# Patient Record
Sex: Female | Born: 1955 | Race: Black or African American | Hispanic: No | Marital: Single | Smoking: Never smoker
Health system: Southern US, Community
[De-identification: ages and names within clinical notes are randomized; demographics above are authoritative.]

## PROBLEM LIST (undated history)

## (undated) DIAGNOSIS — T4145XA Adverse effect of unspecified anesthetic, initial encounter: Secondary | ICD-10-CM

## (undated) DIAGNOSIS — M1990 Unspecified osteoarthritis, unspecified site: Secondary | ICD-10-CM

## (undated) DIAGNOSIS — K760 Fatty (change of) liver, not elsewhere classified: Secondary | ICD-10-CM

## (undated) DIAGNOSIS — K219 Gastro-esophageal reflux disease without esophagitis: Secondary | ICD-10-CM

## (undated) DIAGNOSIS — Z973 Presence of spectacles and contact lenses: Secondary | ICD-10-CM

## (undated) DIAGNOSIS — T7840XA Allergy, unspecified, initial encounter: Secondary | ICD-10-CM

## (undated) DIAGNOSIS — Z9889 Other specified postprocedural states: Secondary | ICD-10-CM

## (undated) DIAGNOSIS — R519 Headache, unspecified: Secondary | ICD-10-CM

## (undated) DIAGNOSIS — E559 Vitamin D deficiency, unspecified: Secondary | ICD-10-CM

## (undated) DIAGNOSIS — R7303 Prediabetes: Secondary | ICD-10-CM

## (undated) DIAGNOSIS — G4730 Sleep apnea, unspecified: Secondary | ICD-10-CM

## (undated) DIAGNOSIS — T8859XA Other complications of anesthesia, initial encounter: Secondary | ICD-10-CM

## (undated) DIAGNOSIS — R42 Dizziness and giddiness: Secondary | ICD-10-CM

## (undated) DIAGNOSIS — J069 Acute upper respiratory infection, unspecified: Secondary | ICD-10-CM

## (undated) DIAGNOSIS — R112 Nausea with vomiting, unspecified: Secondary | ICD-10-CM

## (undated) DIAGNOSIS — L509 Urticaria, unspecified: Secondary | ICD-10-CM

## (undated) DIAGNOSIS — J45909 Unspecified asthma, uncomplicated: Secondary | ICD-10-CM

## (undated) DIAGNOSIS — F419 Anxiety disorder, unspecified: Secondary | ICD-10-CM

## (undated) DIAGNOSIS — R079 Chest pain, unspecified: Secondary | ICD-10-CM

## (undated) HISTORY — PX: BREAST BIOPSY: SHX20

## (undated) HISTORY — PX: TONSILLECTOMY: SUR1361

## (undated) HISTORY — PX: LIPOMA EXCISION: SHX5283

## (undated) HISTORY — DX: Acute upper respiratory infection, unspecified: J06.9

## (undated) HISTORY — DX: Fatty (change of) liver, not elsewhere classified: K76.0

## (undated) HISTORY — DX: Vitamin D deficiency, unspecified: E55.9

## (undated) HISTORY — DX: Dizziness and giddiness: R42

## (undated) HISTORY — DX: Chest pain, unspecified: R07.9

## (undated) HISTORY — DX: Allergy, unspecified, initial encounter: T78.40XA

## (undated) HISTORY — PX: VAGINAL HYSTERECTOMY: SUR661

## (undated) HISTORY — DX: Unspecified asthma, uncomplicated: J45.909

## (undated) HISTORY — PX: BREAST EXCISIONAL BIOPSY: SUR124

## (undated) HISTORY — DX: Urticaria, unspecified: L50.9

## (undated) HISTORY — PX: VAGINAL PROLAPSE REPAIR: SHX830

---
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
## 1984-02-15 HISTORY — PX: APPENDECTOMY: SHX54

## 1984-02-15 HISTORY — PX: VAGINAL HYSTERECTOMY: SUR661

## 2003-03-03 ENCOUNTER — Encounter: Admission: RE | Admit: 2003-03-03 | Discharge: 2003-03-03 | Payer: Self-pay | Admitting: Obstetrics and Gynecology

## 2003-03-28 ENCOUNTER — Encounter: Admission: RE | Admit: 2003-03-28 | Discharge: 2003-03-28 | Payer: Self-pay | Admitting: Obstetrics and Gynecology

## 2003-03-28 ENCOUNTER — Encounter (INDEPENDENT_AMBULATORY_CARE_PROVIDER_SITE_OTHER): Payer: Self-pay | Admitting: Specialist

## 2004-01-09 ENCOUNTER — Encounter: Admission: RE | Admit: 2004-01-09 | Discharge: 2004-01-09 | Payer: Self-pay | Admitting: Obstetrics and Gynecology

## 2005-03-30 ENCOUNTER — Encounter: Admission: RE | Admit: 2005-03-30 | Discharge: 2005-03-30 | Payer: Self-pay | Admitting: Obstetrics and Gynecology

## 2006-06-07 ENCOUNTER — Encounter: Admission: RE | Admit: 2006-06-07 | Discharge: 2006-06-07 | Payer: Self-pay | Admitting: Obstetrics and Gynecology

## 2006-07-05 ENCOUNTER — Other Ambulatory Visit: Admission: RE | Admit: 2006-07-05 | Discharge: 2006-07-05 | Payer: Self-pay | Admitting: Obstetrics and Gynecology

## 2007-08-13 ENCOUNTER — Encounter: Admission: RE | Admit: 2007-08-13 | Discharge: 2007-08-13 | Payer: Self-pay | Admitting: Obstetrics and Gynecology

## 2008-01-21 ENCOUNTER — Encounter: Admission: RE | Admit: 2008-01-21 | Discharge: 2008-01-21 | Payer: Self-pay | Admitting: Internal Medicine

## 2008-06-27 ENCOUNTER — Encounter: Admission: RE | Admit: 2008-06-27 | Discharge: 2008-06-27 | Payer: Self-pay | Admitting: Internal Medicine

## 2009-02-14 DIAGNOSIS — J45909 Unspecified asthma, uncomplicated: Secondary | ICD-10-CM

## 2009-02-14 HISTORY — DX: Unspecified asthma, uncomplicated: J45.909

## 2009-03-19 ENCOUNTER — Encounter: Admission: RE | Admit: 2009-03-19 | Discharge: 2009-03-19 | Payer: Self-pay | Admitting: Obstetrics and Gynecology

## 2010-04-23 ENCOUNTER — Other Ambulatory Visit: Payer: Self-pay | Admitting: *Deleted

## 2010-04-23 ENCOUNTER — Other Ambulatory Visit: Payer: Self-pay | Admitting: Obstetrics and Gynecology

## 2010-04-23 DIAGNOSIS — Z1231 Encounter for screening mammogram for malignant neoplasm of breast: Secondary | ICD-10-CM

## 2010-04-28 ENCOUNTER — Ambulatory Visit: Payer: Self-pay

## 2010-04-29 ENCOUNTER — Ambulatory Visit
Admission: RE | Admit: 2010-04-29 | Discharge: 2010-04-29 | Disposition: A | Discharge status: Home / Self Care | Payer: 59 | Source: Ambulatory Visit | Attending: *Deleted | Admitting: *Deleted

## 2010-04-29 DIAGNOSIS — Z1231 Encounter for screening mammogram for malignant neoplasm of breast: Secondary | ICD-10-CM

## 2011-07-27 ENCOUNTER — Other Ambulatory Visit: Payer: Self-pay | Admitting: Obstetrics and Gynecology

## 2011-07-27 DIAGNOSIS — Z1231 Encounter for screening mammogram for malignant neoplasm of breast: Secondary | ICD-10-CM

## 2011-08-03 ENCOUNTER — Ambulatory Visit
Admission: RE | Admit: 2011-08-03 | Discharge: 2011-08-03 | Disposition: A | Discharge status: Home / Self Care | Payer: 59 | Source: Ambulatory Visit | Attending: Obstetrics and Gynecology | Admitting: Obstetrics and Gynecology

## 2011-08-03 DIAGNOSIS — Z1231 Encounter for screening mammogram for malignant neoplasm of breast: Secondary | ICD-10-CM

## 2013-10-24 ENCOUNTER — Other Ambulatory Visit: Payer: Self-pay

## 2013-10-24 DIAGNOSIS — Z1231 Encounter for screening mammogram for malignant neoplasm of breast: Secondary | ICD-10-CM

## 2013-11-05 ENCOUNTER — Ambulatory Visit
Admission: RE | Admit: 2013-11-05 | Discharge: 2013-11-05 | Disposition: A | Discharge status: Home / Self Care | Payer: 59 | Source: Ambulatory Visit

## 2013-11-05 DIAGNOSIS — Z1231 Encounter for screening mammogram for malignant neoplasm of breast: Secondary | ICD-10-CM

## 2014-10-16 ENCOUNTER — Other Ambulatory Visit: Payer: Self-pay | Admitting: Obstetrics and Gynecology

## 2014-10-16 DIAGNOSIS — R229 Localized swelling, mass and lump, unspecified: Principal | ICD-10-CM

## 2014-10-16 DIAGNOSIS — IMO0002 Reserved for concepts with insufficient information to code with codable children: Secondary | ICD-10-CM

## 2014-10-24 ENCOUNTER — Ambulatory Visit
Admission: RE | Admit: 2014-10-24 | Discharge: 2014-10-24 | Disposition: A | Discharge status: Home / Self Care | Payer: 59 | Source: Ambulatory Visit | Attending: Obstetrics and Gynecology | Admitting: Obstetrics and Gynecology

## 2014-10-24 ENCOUNTER — Other Ambulatory Visit: Payer: Self-pay | Admitting: Obstetrics and Gynecology

## 2014-10-24 DIAGNOSIS — R229 Localized swelling, mass and lump, unspecified: Principal | ICD-10-CM

## 2014-10-24 DIAGNOSIS — IMO0002 Reserved for concepts with insufficient information to code with codable children: Secondary | ICD-10-CM

## 2014-11-05 DIAGNOSIS — J45909 Unspecified asthma, uncomplicated: Secondary | ICD-10-CM | POA: Insufficient documentation

## 2014-11-05 DIAGNOSIS — H1010 Acute atopic conjunctivitis, unspecified eye: Secondary | ICD-10-CM | POA: Insufficient documentation

## 2014-11-05 DIAGNOSIS — J309 Allergic rhinitis, unspecified: Secondary | ICD-10-CM | POA: Insufficient documentation

## 2014-11-17 ENCOUNTER — Ambulatory Visit (INDEPENDENT_AMBULATORY_CARE_PROVIDER_SITE_OTHER): Payer: 59

## 2014-11-17 DIAGNOSIS — H1010 Acute atopic conjunctivitis, unspecified eye: Secondary | ICD-10-CM | POA: Diagnosis not present

## 2014-11-17 DIAGNOSIS — J309 Allergic rhinitis, unspecified: Secondary | ICD-10-CM | POA: Diagnosis not present

## 2014-11-28 ENCOUNTER — Ambulatory Visit (INDEPENDENT_AMBULATORY_CARE_PROVIDER_SITE_OTHER): Payer: 59 | Admitting: Neurology

## 2014-11-28 DIAGNOSIS — J309 Allergic rhinitis, unspecified: Secondary | ICD-10-CM

## 2014-12-02 ENCOUNTER — Other Ambulatory Visit: Payer: Self-pay | Admitting: Allergy and Immunology

## 2014-12-04 ENCOUNTER — Ambulatory Visit (INDEPENDENT_AMBULATORY_CARE_PROVIDER_SITE_OTHER): Payer: 59

## 2014-12-04 DIAGNOSIS — J309 Allergic rhinitis, unspecified: Secondary | ICD-10-CM

## 2014-12-12 ENCOUNTER — Ambulatory Visit (INDEPENDENT_AMBULATORY_CARE_PROVIDER_SITE_OTHER): Payer: 59

## 2014-12-12 DIAGNOSIS — J301 Allergic rhinitis due to pollen: Secondary | ICD-10-CM

## 2014-12-15 ENCOUNTER — Other Ambulatory Visit: Payer: Self-pay | Admitting: Allergy and Immunology

## 2014-12-18 ENCOUNTER — Ambulatory Visit (INDEPENDENT_AMBULATORY_CARE_PROVIDER_SITE_OTHER): Payer: 59

## 2014-12-18 DIAGNOSIS — J309 Allergic rhinitis, unspecified: Secondary | ICD-10-CM | POA: Diagnosis not present

## 2014-12-25 ENCOUNTER — Ambulatory Visit (INDEPENDENT_AMBULATORY_CARE_PROVIDER_SITE_OTHER): Payer: 59

## 2014-12-25 DIAGNOSIS — J301 Allergic rhinitis due to pollen: Secondary | ICD-10-CM

## 2015-01-01 ENCOUNTER — Ambulatory Visit (INDEPENDENT_AMBULATORY_CARE_PROVIDER_SITE_OTHER): Payer: 59 | Admitting: Allergy and Immunology

## 2015-01-01 ENCOUNTER — Encounter: Payer: Self-pay | Admitting: Allergy and Immunology

## 2015-01-01 VITALS — BP 128/76 | HR 78 | Temp 98.0°F | Resp 18

## 2015-01-01 DIAGNOSIS — J309 Allergic rhinitis, unspecified: Secondary | ICD-10-CM | POA: Diagnosis not present

## 2015-01-01 DIAGNOSIS — H1010 Acute atopic conjunctivitis, unspecified eye: Secondary | ICD-10-CM | POA: Diagnosis not present

## 2015-01-01 DIAGNOSIS — J4540 Moderate persistent asthma, uncomplicated: Secondary | ICD-10-CM | POA: Diagnosis not present

## 2015-01-02 MED ORDER — MOMETASONE FURO-FORMOTEROL FUM 200-5 MCG/ACT IN AERO: 2.0000 | INHALATION_SPRAY | Freq: Two times a day (BID) | RESPIRATORY_TRACT | Status: DC

## 2015-01-02 MED ORDER — MOMETASONE FUROATE 50 MCG/ACT NA SUSP: 2.0000 | Freq: Every day | NASAL | Status: DC

## 2015-01-02 MED ORDER — MONTELUKAST SODIUM 10 MG PO TABS: 10.0000 mg | ORAL_TABLET | Freq: Every day | ORAL | Status: DC

## 2015-01-02 NOTE — Progress Notes (Signed)
FOLLOW UP NOTE  RE: Dayton ScrapeKathrina Y Cayson MRN: [id number] DOB: 07/01/1955 [facility] [place] [address] [place] [id number] Date of Office Visit: 01/01/2015  Subjective:  Dayton ScrapeKathrina Y Jambor is a 59 y.o. female who presents today for Follow-up   Assessment:   1. Moderate persistent asthma.  2.      Allergic rhinoconjunctivitis on immunotherapy, improved.  Plan:  1.  [name] will continue her current regime as is working well for her. 2.  Add Allegra 180 mg consistently over the next 2-4 weeks. 3.  Saline nasal lavage each evening at bath shower time. 4.  Nasonex, Benadryl, EpiPen as needed. 5.  Follow-up in 6 months or sure if needed and consider reevaluation of aeroallergen sensitivities. Meds ordered this encounter  Medications  . montelukast (SINGULAIR) 10 MG tablet    Sig: Take 1 tablet (10 mg total) by mouth at bedtime.    Dispense:  30 tablet    Refill:  3   HPI: [name] returns to the office in follow-up of allergic rhinoconjunctivitis and asthma, though has not been seen in a year.  She continues to receive immunotherapy (now 5 years) without any large local or systemic reactions and is pleased with this benefit.  She finds the change in season, typically triggers slight congestion, sneezing, with cough and on occasion, wheeze without difficulty breathing, shortness of breath, chest tightness, disrupted sleep or activity.  Last week with the significant cold temperatures, she did note chest congestion, postnasal drip, and shortness of breath which is now resolved 100%.  She denies sore throat or headache, discolored drainage or emergency department or urgent care visits, prednisone or courses or antibiotics.  She would like to continue her current regime is working well for her and she is very pleased.  No other new medical issues or concerns.  Current Medications: 1.  Singulair 10 mg daily. 2.  Dulera 200mcg  2  puffs twice daily. 3.  Allegra, Nasonex, Ventolin and EpiPen as needed.   Drug Allergies: Allergies  Allergen Reactions  . Erythromycin   . Oxycodone   . Penicillins   . Sulfa Antibiotics     Objective:   Filed Vitals:   01/01/15 1552  BP: 128/76  Pulse: 78  Temp: 98 F (36.7 C)  Resp: 18   Physical Exam  Constitutional: She is well-developed, well-nourished, and in no distress.  HENT:  Head: Atraumatic.  Right Ear: Tympanic membrane and ear canal normal.  Left Ear: Tympanic membrane and ear canal normal.  Nose: Mucosal edema (minimal) present. No rhinorrhea. No epistaxis.  Mouth/Throat: Oropharynx is clear and moist and mucous membranes are normal. No oropharyngeal exudate, posterior oropharyngeal edema or posterior oropharyngeal erythema.  Neck: Neck supple.  Cardiovascular: Normal rate, S1 normal and S2 normal.   No murmur heard. Pulmonary/Chest: Effort normal. She has no wheezes. She has no rhonchi. She has no rales.  Lymphadenopathy:    She has no cervical adenopathy.    Diagnostics: Spirometry:  FVC 1.63--75%, FEV1 1.42--81%.    Roselyn M. Willa RoughHicks, MD  cc: Dorothyann Pengobyn Sanders, MD

## 2015-01-10 ENCOUNTER — Other Ambulatory Visit: Payer: Self-pay | Admitting: Allergy and Immunology

## 2015-01-12 NOTE — Telephone Encounter (Signed)
Patient needs office visit for more refills

## 2015-01-13 DIAGNOSIS — L989 Disorder of the skin and subcutaneous tissue, unspecified: Secondary | ICD-10-CM | POA: Insufficient documentation

## 2015-01-26 ENCOUNTER — Ambulatory Visit (INDEPENDENT_AMBULATORY_CARE_PROVIDER_SITE_OTHER): Payer: 59 | Admitting: Neurology

## 2015-01-26 DIAGNOSIS — J309 Allergic rhinitis, unspecified: Secondary | ICD-10-CM

## 2015-02-20 DIAGNOSIS — J3089 Other allergic rhinitis: Secondary | ICD-10-CM | POA: Diagnosis not present

## 2015-02-26 ENCOUNTER — Ambulatory Visit (INDEPENDENT_AMBULATORY_CARE_PROVIDER_SITE_OTHER): Payer: 59

## 2015-02-26 DIAGNOSIS — J309 Allergic rhinitis, unspecified: Secondary | ICD-10-CM | POA: Diagnosis not present

## 2015-03-14 ENCOUNTER — Other Ambulatory Visit: Payer: Self-pay | Admitting: Allergy and Immunology

## 2015-03-20 DIAGNOSIS — L821 Other seborrheic keratosis: Secondary | ICD-10-CM | POA: Insufficient documentation

## 2015-03-27 ENCOUNTER — Ambulatory Visit (INDEPENDENT_AMBULATORY_CARE_PROVIDER_SITE_OTHER): Payer: 59 | Admitting: *Deleted

## 2015-03-27 DIAGNOSIS — J309 Allergic rhinitis, unspecified: Secondary | ICD-10-CM

## 2015-04-02 ENCOUNTER — Ambulatory Visit: Payer: 59 | Admitting: Allergy and Immunology

## 2015-04-09 ENCOUNTER — Ambulatory Visit: Payer: 59 | Admitting: Allergy and Immunology

## 2015-04-15 ENCOUNTER — Encounter: Payer: Self-pay | Admitting: Allergy and Immunology

## 2015-04-15 ENCOUNTER — Ambulatory Visit (INDEPENDENT_AMBULATORY_CARE_PROVIDER_SITE_OTHER): Payer: 59 | Admitting: Allergy and Immunology

## 2015-04-15 VITALS — BP 140/72 | HR 78 | Temp 98.0°F | Resp 18

## 2015-04-15 DIAGNOSIS — L509 Urticaria, unspecified: Secondary | ICD-10-CM

## 2015-04-15 DIAGNOSIS — H1010 Acute atopic conjunctivitis, unspecified eye: Secondary | ICD-10-CM

## 2015-04-15 DIAGNOSIS — J4540 Moderate persistent asthma, uncomplicated: Secondary | ICD-10-CM

## 2015-04-15 DIAGNOSIS — J309 Allergic rhinitis, unspecified: Secondary | ICD-10-CM

## 2015-04-15 MED ORDER — MONTELUKAST SODIUM 10 MG PO TABS: 10.0000 mg | ORAL_TABLET | Freq: Every day | ORAL | Status: DC

## 2015-04-15 MED ORDER — MOMETASONE FURO-FORMOTEROL FUM 200-5 MCG/ACT IN AERO: 2.0000 | INHALATION_SPRAY | Freq: Two times a day (BID) | RESPIRATORY_TRACT | Status: DC

## 2015-04-15 NOTE — Patient Instructions (Signed)
   Keep written diary of any further episodes--activity, ingestion, exposure and environment--take pictures.  Continue Allegra  each morning.  Begin Zyrtec  each evening.  Avoid all fragranced soaps/lotions/detergents.  No lip products for now--only vaseline plain.  Continue Singulair and Dulera daily and as needed Ventolin and EpiPen/Benadryl.  Follow-up off anti-histamines 72 hours prior for selective food testing.

## 2015-04-24 ENCOUNTER — Ambulatory Visit (INDEPENDENT_AMBULATORY_CARE_PROVIDER_SITE_OTHER): Payer: 59 | Admitting: *Deleted

## 2015-04-24 DIAGNOSIS — J309 Allergic rhinitis, unspecified: Secondary | ICD-10-CM

## 2015-04-27 ENCOUNTER — Other Ambulatory Visit: Payer: Self-pay

## 2015-04-27 MED ORDER — CETIRIZINE HCL 10 MG PO TABS: 10.0000 mg | ORAL_TABLET | Freq: Every day | ORAL | Status: DC

## 2015-04-27 NOTE — Telephone Encounter (Signed)
Zyrtec rX sent to pharmacy

## 2015-04-28 ENCOUNTER — Other Ambulatory Visit: Payer: Self-pay

## 2015-04-28 MED ORDER — FEXOFENADINE HCL 180 MG PO TABS: ORAL_TABLET | ORAL | Status: DC

## 2015-04-28 NOTE — Progress Notes (Signed)
FOLLOW UP NOTE  RE: CATHLINE DOWEN MRN: [id number] DOB: 1956-01-24 [facility] [place] [address] [place] [address] Date of Office Visit: 04/15/2015  Subjective:  Olivia Paul is a 60 y.o. female who presents today for Allergic Reaction  Assessment:   1. Moderate persistent asthma, well controlled.  2. Allergic rhinoconjunctivitis   3.      Reported episode of contact urticaria--likely related to scented [employer] lotion. 4.      Intermittent urticaria, unclear etiology. Plan:   Meds ordered this encounter  Medications  . mometasone-formoterol (DULERA) 200-5 MCG/ACT AERO    Sig: Inhale 2 puffs into the lungs 2 (two) times daily.    Dispense:  1 Inhaler    Refill:  3  . montelukast (SINGULAIR) 10 MG tablet    Sig: Take 1 tablet (10 mg total) by mouth at bedtime.    Dispense:  30 tablet    Refill:  3   Patient Instructions  1.  Keep written diary of any further episodes--activity, ingestion, exposure and environment--take pictures. 2.  Continue Allegra  each morning. 3.  Begin Zyrtec  each evening. 4.  Avoid all fragranced soaps/lotions/detergents. 5.  No lip products for now--only vaseline plain. 6.  Continue Singulair and Dulera daily and as needed Ventolin and EpiPen/Benadryl. 7.  Follow-up off anti-histamines 72 hours prior for selective food testing.    HPI: [name] returns to the office in follow-up of Asthma, allergic rhinoconjunctitivitis and recent concern for hives.  Overall she reports feeling well and tolerating allergy injections without issue.  Denies large local or systemic reactions.  Denies any recent albuterol use or recurring breathing concerns, no cough, wheeze, shortness of breath, headache, fever, sore throat or recurring nasal symptoms.  Tolerating the fluctuant weather patterns without issue.  However she reports red areas at her lips in December 2016, which thought was related to her son's  dog, then after using Bath/Body works lotion, hives at her stomach which resolved with discontinuation of that product.  However another episode occurred after leaving work under the right side of her lower lip, which she thought was a hive and on occasion believes if scratching frequently at her abdomen has raised red areas.  She believes on the work day, breakfast was boiled eggs, strawberry or blueberry yogurt and lunch jambalaya with grape soda, chips and applesauce.  Denies any associated tongue/throat swelling, GI upset or respiratory symptoms.  She went to Urgent care yesterday where received Cortisone injection which was beneficial, now much improved.  She has maintained on maintencance medications and is otherwise pleased with her regime. Denies ED or visitsor antibiotic courses. Reports sleep and activity are normal.  Catlin has a current medication list which includes the following prescription(s): albuterol, epipen 2-pak, fexofenadine, mometasone, mometasone-formoterol, montelukast, tolterodine.   Drug Allergies: Allergies  Allergen Reactions  . Erythromycin   . Oxycodone   . Penicillins   . Sulfa Antibiotics    Objective:   Filed Vitals:   04/15/15 1528  BP: 140/72  Pulse: 78  Temp: 98 F (36.7 C)  Resp: 18   SpO2 Readings from Last 1 Encounters:  04/15/15 98%   Physical Exam  Constitutional: She is well-developed, well-nourished, and in no distress.  HENT:  Head: Atraumatic.  Right Ear: Tympanic membrane and ear canal normal.  Left Ear: Tympanic membrane and ear canal normal.  Nose: Mucosal edema present. No rhinorrhea. No epistaxis.  Mouth/Throat: Oropharynx is clear  and moist and mucous membranes are normal. No oropharyngeal exudate, posterior oropharyngeal edema or posterior oropharyngeal erythema.  Neck: Neck supple.  Cardiovascular: Normal rate, S1 normal and S2 normal.   No murmur heard. Pulmonary/Chest: Effort normal. She has no wheezes. She has no rhonchi.  She has no rales.  Lymphadenopathy:    She has no cervical adenopathy.  Skin: Skin is warm and intact. No cyanosis. Nails show no clubbing.  Tiny macular red area below right lower lip, otherwise skin is clear.   Diagnostics: Spirometry:  FVC  1.70--78%, FEV1  1.59--88%.    Roselyn M. Willa RoughHicks, MD  cc: Gwynneth AlimentSANDERS,ROBYN N, MD

## 2015-05-07 ENCOUNTER — Encounter: Payer: Self-pay | Admitting: Allergy and Immunology

## 2015-05-07 ENCOUNTER — Ambulatory Visit (INDEPENDENT_AMBULATORY_CARE_PROVIDER_SITE_OTHER): Payer: 59 | Admitting: Allergy and Immunology

## 2015-05-07 VITALS — BP 138/84 | HR 97 | Resp 16 | Ht 60.0 in

## 2015-05-07 DIAGNOSIS — J4540 Moderate persistent asthma, uncomplicated: Secondary | ICD-10-CM | POA: Diagnosis not present

## 2015-05-07 DIAGNOSIS — L509 Urticaria, unspecified: Secondary | ICD-10-CM

## 2015-05-07 NOTE — Patient Instructions (Signed)
   Selected labs at [facility].  Continue current medication regime.  Epi-pen/benadryl as needed.  Keep written diary of any further episodes/take pictures.  Avoid fragranced soaps/lotion/detergents.  Follow-up as discussed.

## 2015-05-07 NOTE — Progress Notes (Signed)
     FOLLOW UP NOTE  RE: Olivia Paul MRN: [id number] DOB: 05/16/1955 [facility] [place] [address] [place] [id number] Date of Office Visit: 05/07/2015  Subjective:  Olivia ScrapeKathrina Y Paul is a 60 y.o. female who presents today for Follow-up for selected allergy testing. Assessment:   1. Hives, intermittent--unclear etiology.   2. Moderate persistent asthma, well controlled.  3.      Allergic rhinoconjunctivitis on immunotherapy. Plan:  1.  Olivia Paul will obtain selected [facility], specific IgE for foods. 2.  Continue current medication regime and restart Zyrtec/Allegra. 3.  Epi-pen/benadryl as needed. 4.  Keep written diary of any further episodes/take pictures. 5.  Avoid fragranced soaps/lotion/detergents and consider change to any facial products. 6.  Follow-up as discussed and assess need for further evaluation.  HPI: Olivia Paul returns to the office off antihistamines for evaluation of selected foods.  She describes 2 additional episodes of erythematous small hives (under lip) since her last visit without generalized skin changes or itching.  No swelling of lips, tongue or throat, nor GI or respiratory symptoms.  Most recent episodes do not seem related to any activity, ingestion or environment. She is not aware of any specific triggers. She feels her breathing is good and continues to tolerate immunotherapy without issue--no large local or systemic reactions.  Only mild nasal congestion off Zyrtec without cough, wheeze, chest congestion/tightness or difficulty in breathing.  She has no other new concerns.  Denies ED or urgent care visits, prednisone or antibiotic courses. Reports sleep and activity are normal.  Olivia Paul has a current medication list which includes the following prescription(s): albuterol, cetirizine, epipen 2-pak, fexofenadine, mometasone, mometasone-formoterol, montelukast, and tolterodine.   Drug Allergies: Allergies  Allergen  Reactions  . Erythromycin   . Oxycodone   . Penicillins   . Sulfa Antibiotics    Objective:   Filed Vitals:   05/07/15 1436  BP: 138/84  Pulse: 97  Resp: 16   SpO2 Readings from Last 1 Encounters:  05/07/15 98%   Physical Exam  Constitutional: She is well-developed, well-nourished, and in no distress.  HENT:  Head: Atraumatic.  Right Ear: Tympanic membrane and ear canal normal.  Left Ear: Tympanic membrane and ear canal normal.  Nose: Mucosal edema present. No rhinorrhea. No epistaxis.  Mouth/Throat: Oropharynx is clear and moist and mucous membranes are normal. No oropharyngeal exudate, posterior oropharyngeal edema or posterior oropharyngeal erythema.  Neck: Neck supple.  Cardiovascular: Normal rate, S1 normal and S2 normal.   No murmur heard. Pulmonary/Chest: Effort normal. She has no wheezes. She has no rhonchi. She has no rales.  Lymphadenopathy:    She has no cervical adenopathy.  Skin: Skin is warm, dry and intact. No cyanosis. Nails show no clubbing.  No dermatographism or urticarial lesions.   Diagnostics: Spirometry:  FVC 1.73--88%,  FEV1 1.60--99%. Skin testing:  Negative to selected foods as documented on flowsheet.    Olivia M. Willa RoughHicks, MD  cc: Gwynneth AlimentSANDERS,ROBYN N, MD

## 2015-05-08 LAB — ALLERGY-SHELLFISH PANEL
CRAB: 0.22 kU/L — AB
Clams: 0.24 kU/L — ABNORMAL HIGH

## 2015-05-08 LAB — CP658 FISH PANEL
Allergen, Flounder, Rf337: <0.1 kU/L
Allergen, Salmon, f41: <0.1 kU/L
Allergen, Trout, f204: <0.1 kU/L
Fish Cod: <0.1 kU/L
Tuna IgE: <0.1 kU/L

## 2015-05-08 LAB — ALLERGY PANEL 18, NUT MIX GROUP
Almonds: <0.1 kU/L
Cashew IgE: <0.1 kU/L
Pecan Nut: <0.1 kU/L
Sesame Seed f10: <0.1 kU/L

## 2015-05-08 LAB — ALLERGEN, ORANGE F33: Orange: <0.1 kU/L

## 2015-05-08 LAB — ALLERGEN, STRAWBERRY, F44: Allergen, Strawberry, f44: <0.1 kU/L

## 2015-05-08 LAB — ALLERGEN PEACH F95: Allergen, Peach f95: <0.1 kU/L

## 2015-05-08 LAB — ALLERGEN, CINNAMON, RF220

## 2015-05-08 LAB — IGE: IGE (IMMUNOGLOBULIN E), SERUM: 101 kU/L (ref <115)

## 2015-05-11 LAB — ALPHA-GAL PANEL: Galactose-alpha-1,3-galactose IgE: <0.1 kU/L (ref <0.35)

## 2015-05-25 ENCOUNTER — Telehealth: Payer: Self-pay | Admitting: Allergy and Immunology

## 2015-05-25 NOTE — Telephone Encounter (Signed)
Phone call to patient to review lab results. Left message on voicemail of minimally positive crab and clam otherwise negative.  Will call again to review any significance of those foods.

## 2015-05-28 ENCOUNTER — Ambulatory Visit (INDEPENDENT_AMBULATORY_CARE_PROVIDER_SITE_OTHER): Payer: 59

## 2015-05-28 DIAGNOSIS — J309 Allergic rhinitis, unspecified: Secondary | ICD-10-CM

## 2015-06-23 ENCOUNTER — Ambulatory Visit (INDEPENDENT_AMBULATORY_CARE_PROVIDER_SITE_OTHER): Payer: 59 | Admitting: *Deleted

## 2015-06-23 DIAGNOSIS — J309 Allergic rhinitis, unspecified: Secondary | ICD-10-CM | POA: Diagnosis not present

## 2015-07-02 ENCOUNTER — Ambulatory Visit (INDEPENDENT_AMBULATORY_CARE_PROVIDER_SITE_OTHER): Payer: Self-pay | Admitting: Neurology

## 2015-07-02 ENCOUNTER — Ambulatory Visit (INDEPENDENT_AMBULATORY_CARE_PROVIDER_SITE_OTHER): Payer: 59 | Admitting: Neurology

## 2015-07-02 ENCOUNTER — Encounter: Payer: Self-pay | Admitting: Neurology

## 2015-07-02 ENCOUNTER — Ambulatory Visit (INDEPENDENT_AMBULATORY_CARE_PROVIDER_SITE_OTHER): Payer: 59

## 2015-07-02 DIAGNOSIS — J309 Allergic rhinitis, unspecified: Secondary | ICD-10-CM | POA: Diagnosis not present

## 2015-07-02 DIAGNOSIS — L989 Disorder of the skin and subcutaneous tissue, unspecified: Secondary | ICD-10-CM

## 2015-07-02 DIAGNOSIS — M79601 Pain in right arm: Secondary | ICD-10-CM | POA: Diagnosis not present

## 2015-07-02 NOTE — Progress Notes (Signed)
Please refer to EMG and nerve conduction study procedure note. 

## 2015-07-02 NOTE — Procedures (Signed)
     HISTORY:  Olivia MantisKathrina Paul is a 60 year old patient with onset of right arm intermittent weakness and right upper arm and forearm discomfort that began in March 2017. The patient has a dull achy pain in the upper arm and forearm, she will have some intermittent episodes of difficulty with lifting with the right arm. She denies any neck pain or significant numbness. She is being evaluated for this issue. She has also noted difficulty was staggering to the left when she gets up out of bed at night to go to the bathroom.  NERVE CONDUCTION STUDIES:  Nerve conduction studies were performed on both upper extremities. The distal motor latencies and motor amplitudes for the median and ulnar nerves were within normal limits. The F wave latencies and nerve conduction velocities for these nerves were also normal. The sensory latencies for the median and ulnar nerves were normal.   EMG STUDIES:  EMG study was performed on the right upper extremity:  The first dorsal interosseous muscle reveals 2 to 4 K units with full recruitment. No fibrillations or positive waves were noted. The abductor pollicis brevis muscle reveals 2 to 4 K units with full recruitment. No fibrillations or positive waves were noted. The extensor indicis proprius muscle reveals 1 to 3 K units with full recruitment. No fibrillations or positive waves were noted. The pronator teres muscle reveals 2 to 3 K units with full recruitment. No fibrillations or positive waves were noted. The biceps muscle reveals 1 to 2 K units with full recruitment. No fibrillations or positive waves were noted. The triceps muscle reveals 2 to 4 K units with full recruitment. No fibrillations or positive waves were noted. The anterior deltoid muscle reveals 2 to 3 K units with full recruitment. No fibrillations or positive waves were noted. The cervical paraspinal muscles were tested at 2 levels. No abnormalities of insertional activity were seen at either  level tested. There was good relaxation.   IMPRESSION:  Nerve conduction studies done on both upper extremities were within normal limits. No evidence of a neuropathy is seen. EMG evaluation of the right upper extremity is unremarkable. There is no evidence of an overlying cervical radiculopathy.  Marlan Palau. Keith Willis MD 07/02/2015 4:50 PM  [facility] [address] [place], [place] [address]  Phone [phone] Fax [phone]

## 2015-07-09 ENCOUNTER — Ambulatory Visit (INDEPENDENT_AMBULATORY_CARE_PROVIDER_SITE_OTHER): Payer: 59

## 2015-07-09 ENCOUNTER — Encounter: Payer: 59 | Admitting: Neurology

## 2015-07-09 DIAGNOSIS — J309 Allergic rhinitis, unspecified: Secondary | ICD-10-CM

## 2015-07-16 ENCOUNTER — Ambulatory Visit (INDEPENDENT_AMBULATORY_CARE_PROVIDER_SITE_OTHER): Payer: 59

## 2015-07-16 DIAGNOSIS — J309 Allergic rhinitis, unspecified: Secondary | ICD-10-CM | POA: Diagnosis not present

## 2015-07-23 ENCOUNTER — Ambulatory Visit (INDEPENDENT_AMBULATORY_CARE_PROVIDER_SITE_OTHER): Payer: 59

## 2015-07-23 DIAGNOSIS — J309 Allergic rhinitis, unspecified: Secondary | ICD-10-CM | POA: Diagnosis not present

## 2015-08-20 ENCOUNTER — Ambulatory Visit: Payer: 59 | Admitting: Allergy and Immunology

## 2015-08-26 ENCOUNTER — Ambulatory Visit: Payer: 59 | Admitting: Allergy and Immunology

## 2015-08-26 ENCOUNTER — Ambulatory Visit (INDEPENDENT_AMBULATORY_CARE_PROVIDER_SITE_OTHER): Payer: 59 | Admitting: Allergy and Immunology

## 2015-08-26 ENCOUNTER — Encounter: Payer: Self-pay | Admitting: Allergy and Immunology

## 2015-08-26 VITALS — BP 120/80 | HR 82 | Temp 98.0°F | Resp 16

## 2015-08-26 DIAGNOSIS — H1010 Acute atopic conjunctivitis, unspecified eye: Secondary | ICD-10-CM | POA: Diagnosis not present

## 2015-08-26 DIAGNOSIS — J309 Allergic rhinitis, unspecified: Secondary | ICD-10-CM | POA: Diagnosis not present

## 2015-08-26 DIAGNOSIS — J4540 Moderate persistent asthma, uncomplicated: Secondary | ICD-10-CM | POA: Diagnosis not present

## 2015-08-26 NOTE — Patient Instructions (Signed)
   Consider in office crab challenge.  Decrease Dulera to 2 puffs once daily.   Epi-pen/benadryl as needed.

## 2015-08-26 NOTE — Progress Notes (Signed)
FOLLOW UP NOTE  RE: Dayton ScrapeKathrina Y Herren MRN: [id number] DOB: 07/13/1955 [facility] [place] [address] [place] [address] Date of Office Visit: 08/26/2015  Subjective:  Dayton ScrapeKathrina Y Regula is a 60 y.o. female who presents today for Asthma  Assessment:   1. Moderate persistent asthma, well controlled.    2. Allergic rhinoconjunctivitis, improved on immunotherapy.    3.      Previous episode of hives without further difficulty and clear, skin today. 4.      Question of seafood related trigger with minimally reactive crab, and clam specific IgE. Plan:   Meds ordered this encounter  Medications  . montelukast (SINGULAIR) 10 MG tablet    Sig: Take 1 tablet (10 mg total) by mouth at bedtime.    Dispense:  30 tablet    Refill:  3  1.  Consider in office crab challenge. 2.  Decrease Dulera to 100mcg 2 puffs once daily and continue Singulair and Nasonex daily, with as needed Allegra. 3.  Epi-pen/benadryl per protocol as needed. 4.  Consider discontinuing immunotherapy at the end of these vials. 5.  Follow-up in December 2017 or sooner if needed. 6.  Received influenza vaccine this season through primary MD.  HPI: [name] returns to the office in follow-up of allergic rhinoconjunctivitis, asthma and concern for food allergy.  Since her last visit in March, she describes no further episodes of itching, hives, rashes or oral irritation/swelling.  She is eating shrimp, sausage and most of the components of the jambalaya separately without concern.  At the end of the visit, she actually realized she had ingested a different restaurants jambalaya without difficult as well.  She describes her breathing is doing very well, continues receiving immunotherapy once a month without large local or systemic reactions. She finds medication her regime beneficial and reports no new medical issues.  Typically she uses Allegra as needed, for rare postnasal drip and Dulera in the  morning only with Singulair.  No recent Pro Air use and has completed nearly 7 years of immunotherapy.  Denies ED or urgent care visits, prednisone or antibiotic courses. Reports sleep and activity are normal.  [name] has a current medication list which includes the following prescription(s): albuterol, cetirizine, epipen 2-pak, fexofenadine, meloxicam, mometasone, mometasone-formoterol, montelukast, toviaz.   Drug Allergies: Allergies  Allergen Reactions  . Erythromycin   . Oxycodone   . Penicillins   . Sulfa Antibiotics    Objective:   Filed Vitals:   08/26/15 1404  BP: 120/80  Pulse: 82  Temp: 98 F (36.7 C)  Resp: 16   SpO2 Readings from Last 1 Encounters:  08/26/15 94%   Physical Exam  Constitutional: She is well-developed, well-nourished, and in no distress.  HENT:  Head: Atraumatic.  Right Ear: Tympanic membrane and ear canal normal.  Left Ear: Tympanic membrane and ear canal normal.  Nose: Mucosal edema present. No rhinorrhea. No epistaxis.  Mouth/Throat: Oropharynx is clear and moist and mucous membranes are normal. No oropharyngeal exudate, posterior oropharyngeal edema or posterior oropharyngeal erythema.  Neck: Neck supple.  Cardiovascular: Normal rate, S1 normal and S2 normal.   No murmur heard. Pulmonary/Chest: Effort normal. She has no wheezes. She has no rhonchi. She has no rales.  Lymphadenopathy:    She has no cervical adenopathy.  Skin: Skin is warm and intact. No rash noted. No cyanosis. Nails show no clubbing.   Diagnostics: Spirometry:  FVC  1.75--80%, FEV1 1.49--83% (see scanned image).  ACT=23.  Specific IgE for selected foods= minimal reactivity to clam and crab, otherwise negative--see results section.    Shajuana Mclucas M. Willa Rough, MD  cc: Gwynneth Aliment, MD

## 2015-08-27 MED ORDER — MONTELUKAST SODIUM 10 MG PO TABS: 10.0000 mg | ORAL_TABLET | Freq: Every day | ORAL | Status: DC

## 2015-09-04 ENCOUNTER — Encounter: Payer: Self-pay | Admitting: *Deleted

## 2015-09-04 NOTE — Progress Notes (Signed)
[facility]

## 2015-09-08 DIAGNOSIS — J3089 Other allergic rhinitis: Secondary | ICD-10-CM | POA: Diagnosis not present

## 2015-09-28 ENCOUNTER — Ambulatory Visit (INDEPENDENT_AMBULATORY_CARE_PROVIDER_SITE_OTHER): Payer: 59

## 2015-09-28 DIAGNOSIS — J309 Allergic rhinitis, unspecified: Secondary | ICD-10-CM

## 2015-10-06 ENCOUNTER — Other Ambulatory Visit: Payer: Self-pay | Admitting: Internal Medicine

## 2015-10-06 DIAGNOSIS — R1084 Generalized abdominal pain: Secondary | ICD-10-CM

## 2015-10-06 DIAGNOSIS — R109 Unspecified abdominal pain: Secondary | ICD-10-CM

## 2015-10-08 ENCOUNTER — Other Ambulatory Visit: Payer: Self-pay | Admitting: *Deleted

## 2015-10-08 MED ORDER — FLUTICASONE PROPIONATE 50 MCG/ACT NA SUSP: NASAL | 4 refills | Status: DC

## 2015-10-20 ENCOUNTER — Ambulatory Visit
Admission: RE | Admit: 2015-10-20 | Discharge: 2015-10-20 | Disposition: A | Discharge status: Home / Self Care | Payer: 59 | Source: Ambulatory Visit | Attending: Internal Medicine | Admitting: Internal Medicine

## 2015-10-20 DIAGNOSIS — R109 Unspecified abdominal pain: Secondary | ICD-10-CM

## 2015-10-20 DIAGNOSIS — R1084 Generalized abdominal pain: Secondary | ICD-10-CM

## 2015-10-28 ENCOUNTER — Ambulatory Visit (INDEPENDENT_AMBULATORY_CARE_PROVIDER_SITE_OTHER): Payer: 59

## 2015-10-28 DIAGNOSIS — J309 Allergic rhinitis, unspecified: Secondary | ICD-10-CM | POA: Diagnosis not present

## 2015-11-16 ENCOUNTER — Other Ambulatory Visit: Payer: Self-pay | Admitting: Obstetrics and Gynecology

## 2015-11-16 DIAGNOSIS — Z1231 Encounter for screening mammogram for malignant neoplasm of breast: Secondary | ICD-10-CM

## 2015-11-26 ENCOUNTER — Ambulatory Visit (INDEPENDENT_AMBULATORY_CARE_PROVIDER_SITE_OTHER): Payer: 59 | Admitting: *Deleted

## 2015-11-26 ENCOUNTER — Ambulatory Visit
Admission: RE | Admit: 2015-11-26 | Discharge: 2015-11-26 | Disposition: A | Discharge status: Home / Self Care | Payer: 59 | Source: Ambulatory Visit | Attending: Obstetrics and Gynecology | Admitting: Obstetrics and Gynecology

## 2015-11-26 DIAGNOSIS — Z1231 Encounter for screening mammogram for malignant neoplasm of breast: Secondary | ICD-10-CM

## 2015-11-26 DIAGNOSIS — J309 Allergic rhinitis, unspecified: Secondary | ICD-10-CM | POA: Diagnosis not present

## 2015-11-26 DIAGNOSIS — H1010 Acute atopic conjunctivitis, unspecified eye: Secondary | ICD-10-CM

## 2015-12-28 ENCOUNTER — Ambulatory Visit (INDEPENDENT_AMBULATORY_CARE_PROVIDER_SITE_OTHER): Payer: 59 | Admitting: *Deleted

## 2015-12-28 DIAGNOSIS — J309 Allergic rhinitis, unspecified: Secondary | ICD-10-CM

## 2016-02-01 ENCOUNTER — Ambulatory Visit (INDEPENDENT_AMBULATORY_CARE_PROVIDER_SITE_OTHER): Payer: 59 | Admitting: *Deleted

## 2016-02-01 DIAGNOSIS — J309 Allergic rhinitis, unspecified: Secondary | ICD-10-CM | POA: Diagnosis not present

## 2016-02-11 ENCOUNTER — Ambulatory Visit (INDEPENDENT_AMBULATORY_CARE_PROVIDER_SITE_OTHER): Payer: 59

## 2016-02-11 DIAGNOSIS — J309 Allergic rhinitis, unspecified: Secondary | ICD-10-CM | POA: Diagnosis not present

## 2016-02-19 ENCOUNTER — Ambulatory Visit (INDEPENDENT_AMBULATORY_CARE_PROVIDER_SITE_OTHER): Payer: 59

## 2016-02-19 DIAGNOSIS — J309 Allergic rhinitis, unspecified: Secondary | ICD-10-CM | POA: Diagnosis not present

## 2016-02-26 ENCOUNTER — Ambulatory Visit (INDEPENDENT_AMBULATORY_CARE_PROVIDER_SITE_OTHER): Payer: 59

## 2016-02-26 DIAGNOSIS — E559 Vitamin D deficiency, unspecified: Secondary | ICD-10-CM | POA: Diagnosis not present

## 2016-02-26 DIAGNOSIS — M8589 Other specified disorders of bone density and structure, multiple sites: Secondary | ICD-10-CM | POA: Diagnosis not present

## 2016-02-26 DIAGNOSIS — J309 Allergic rhinitis, unspecified: Secondary | ICD-10-CM

## 2016-03-04 NOTE — Addendum Note (Signed)
Addended by: Maryjean MornFREEMAN, LOGAN D on: 03/04/2016 04:42 PM   Modules accepted: Orders

## 2016-03-08 ENCOUNTER — Ambulatory Visit (INDEPENDENT_AMBULATORY_CARE_PROVIDER_SITE_OTHER): Payer: 59 | Admitting: *Deleted

## 2016-03-08 DIAGNOSIS — J309 Allergic rhinitis, unspecified: Secondary | ICD-10-CM | POA: Diagnosis not present

## 2016-04-05 ENCOUNTER — Ambulatory Visit (INDEPENDENT_AMBULATORY_CARE_PROVIDER_SITE_OTHER): Payer: 59 | Admitting: *Deleted

## 2016-04-05 DIAGNOSIS — J309 Allergic rhinitis, unspecified: Secondary | ICD-10-CM | POA: Diagnosis not present

## 2016-04-06 ENCOUNTER — Telehealth: Payer: Self-pay | Admitting: *Deleted

## 2016-04-06 NOTE — Telephone Encounter (Signed)
Patient's insurance will no longer cover Dulera. They prefer generic AirDuo. Please advise

## 2016-04-06 NOTE — Telephone Encounter (Signed)
That is surprising. We can substitute AirDuo 232/14 one inhalation once daily. Please tell the patient that she will not need a spacer with this medication.   Thanks, Malachi BondsJoel Gallagher, MD [facility]

## 2016-04-07 MED ORDER — FLUTICASONE-SALMETEROL 232-14 MCG/ACT IN AEPB: 1.0000 | INHALATION_SPRAY | Freq: Every day | RESPIRATORY_TRACT | 3 refills | Status: DC

## 2016-04-07 NOTE — Addendum Note (Signed)
Addended by: Mliss FritzBLACK, Ronnie Doo I on: 04/07/2016 09:19 AM   Modules accepted: Orders

## 2016-04-07 NOTE — Telephone Encounter (Signed)
Prescription has been sent in for patient. I called her and let her know not to use the spacer with this one as well. She also let me know that she had changed her mind on allergy injections and that she did want to CONTINUE them. I have scheduled her with Dr. Delorse LekPadgett for 04-14-16 at 530 because she was well overdue for a follow up.

## 2016-04-11 ENCOUNTER — Other Ambulatory Visit: Payer: Self-pay | Admitting: *Deleted

## 2016-04-11 MED ORDER — MONTELUKAST SODIUM 10 MG PO TABS: 10.0000 mg | ORAL_TABLET | Freq: Every day | ORAL | 0 refills | Status: DC

## 2016-04-14 ENCOUNTER — Ambulatory Visit: Payer: Self-pay | Admitting: Allergy

## 2016-04-28 ENCOUNTER — Encounter: Payer: Self-pay | Admitting: Allergy

## 2016-04-28 ENCOUNTER — Ambulatory Visit (INDEPENDENT_AMBULATORY_CARE_PROVIDER_SITE_OTHER): Payer: 59 | Admitting: Allergy

## 2016-04-28 VITALS — BP 120/80 | HR 78 | Temp 98.2°F | Resp 16

## 2016-04-28 DIAGNOSIS — J309 Allergic rhinitis, unspecified: Secondary | ICD-10-CM

## 2016-04-28 DIAGNOSIS — J4540 Moderate persistent asthma, uncomplicated: Secondary | ICD-10-CM

## 2016-04-28 DIAGNOSIS — H1010 Acute atopic conjunctivitis, unspecified eye: Secondary | ICD-10-CM

## 2016-04-28 NOTE — Patient Instructions (Addendum)
Asthma - Continue Airduo 1 puff daily.  Increase to 1 puff twice a day during asthma flares/illness - albuterol as needed 2 puffs every 4-6 hours for cough, wheeze, chest tightness, difficulty breathing - continue singulair 10mg  at bedtime daily Asthma control goals:   Full participation in all desired activities (may need albuterol before activity)  Albuterol use two time or less a week on average (not counting use with activity)  Cough interfering with sleep two time or less a month  Oral steroids no more than once a year  No hospitalizations  Allergies  - continue allergy shots monthly until current vial is gone then stop.  This should get you to fall with weed pollen season.  If you struggle with allergy symptoms off allergy shots once you have stopped let us know and we can restart.   - continue singulair, as needed Allegra and Nasonex - make sure to take Allegra on the days of your allergy shot - bring Epipen with you on days of your allergy shot.    Follow-up 6 months

## 2016-04-28 NOTE — Progress Notes (Signed)
Follow-up Note  RE: Olivia Paul MRN: [id number] DOB: 1955/12/18 Date of Office Visit: 04/28/2016  History of present illness: Olivia Paul is a 61 y.o. female presenting today for follow-up of asthma and allergic rhinoconjunctivitis. She was last seen in the office on 08/26/15 by Dr. Willa Rough.    She feels her asthma is under good control.  She was changed to Airduo due to insurance purposes.  She takes 1 puff daily. She likes Airduo.  She has not needed to use albuterol since being on Airduo.  She denies any ED or urgent care visits, hospitalizations or steroid use. No nighttime awakenings.    She is on allergen immunotherapy now for the past 7-8 years.  She has discussed previously of Dr. Willa Rough about stopping.  She is getting monthly injections and tolerates them well. She does take Allegra now daily with the change of season. She also takes Singulair daily and uses Nasonex 1 spray in each nostril daily.  There have been previous concern for possible seafood allergy however she had minimal reactivity on serum IgE to crab and clam.  Since her last visit with that she has eaten crab cakes.  She thought on one occasion her throat felt scratch however she has had it since on several occasions without issue.  She also has had clams with out issue.     Review of systems: Review of Systems  Constitutional: Negative for chills, fever and malaise/fatigue.  HENT: Negative for congestion, ear discharge, ear pain, nosebleeds, sinus pain, sore throat and tinnitus.   Eyes: Negative for discharge and redness.  Respiratory: Negative for cough, shortness of breath and wheezing.   Cardiovascular: Negative for chest pain.  Gastrointestinal: Negative for abdominal pain, heartburn, nausea and vomiting.  Musculoskeletal: Negative for joint pain and myalgias.  Skin: Negative for itching and rash.  Neurological: Negative for headaches.    All other systems negative unless noted above in HPI  Past  medical/social/surgical/family history have been reviewed and are unchanged unless specifically indicated below.  No changes  Medication List: Allergies as of 04/28/2016      Reactions   Erythromycin    Oxycodone    Penicillins    Sulfa Antibiotics       Medication List       Accurate as of 04/28/16  6:24 PM. Always use your most recent med list.          cetirizine 10 MG tablet Commonly known as:  ZYRTEC Take 1 tablet (10 mg total) by mouth at bedtime.   Doxepin HCl 5 % Crea Apply 1-2 grams to affected area up to three times daily.  Max 6 grams/day   EPINEPHrine 0.3 mg/0.3 mL Soaj injection Commonly known as:  EPI-PEN USE AS DIRECTED IN THE EVENT OF A SEVERE LIFE THREATNING ALLERGIC REACTION.   fexofenadine 180 MG tablet Commonly known as:  ALLEGRA TAKE ONE TABLET ONCE DAILY FOR RUNNY NOSE OR ITCHING   fluticasone 50 MCG/ACT nasal spray Commonly known as:  FLONASE Use 1-2 sprays each nostril daily for stuffy nose or drainage   Fluticasone-Salmeterol 232-14 MCG/ACT Aepb Commonly known as:  AIRDUO RESPICLICK 232/14 Inhale 1 puff into the lungs daily.   lidocaine 5 % ointment Commonly known as:  XYLOCAINE apply 2-3 grams TO affected area UP TO FOUR TIMES DAILY, max of 10 grams PER DAY   MOBIC 7.5 MG tablet Generic drug:  meloxicam   mometasone 50 MCG/ACT nasal spray Commonly known as:  NASONEX Place  2 sprays into the nose daily.   montelukast 10 MG tablet Commonly known as:  SINGULAIR Take 1 tablet (10 mg total) by mouth at bedtime.   tolterodine 4 MG 24 hr capsule Commonly known as:  DETROL LA Take 4 mg by mouth daily.   TOVIAZ 8 MG Tb24 tablet Generic drug:  fesoterodine Take 8 mg by mouth daily.       Known medication allergies: Allergies  Allergen Reactions  . Erythromycin   . Oxycodone   . Penicillins   . Sulfa Antibiotics      Physical examination: Blood pressure 120/80, pulse 78, temperature 98.2 F (36.8 C), temperature source  Oral, resp. rate 16, SpO2 99 %.  General: Alert, interactive, in no acute distress. HEENT: TMs pearly gray, turbinates minimally edematous without discharge, post-pharynx non erythematous. Neck: Supple without lymphadenopathy. Lungs: Clear to auscultation without wheezing, rhonchi or rales. {no increased work of breathing. CV: Normal S1, S2 without murmurs. Abdomen: Nondistended, nontender. Skin: Warm and dry, without lesions or rashes. Extremities:  No clubbing, cyanosis or edema. Neuro:   Grossly intact.  Diagnositics/Labs: None today  Assessment and plan:   Asthma, Moderate persistent - Continue Airduo 1 puff daily.  Increase to 1 puff twice a day during asthma flares/illness - albuterol as needed 2 puffs every 4-6 hours for cough, wheeze, chest tightness, difficulty breathing - continue singulair 10mg  at bedtime daily Asthma control goals:   Full participation in all desired activities (may need albuterol before activity)  Albuterol use two time or less a week on average (not counting use with activity)  Cough interfering with sleep two time or less a month  Oral steroids no more than once a year  No hospitalizations  Allergic rhinoconjunctivitis  - continue allergy shots monthly until current vial is gone then stop.  This should get you to fall with weed pollen season.  If you struggle with allergy symptoms off allergy shots once you have stopped let us know and we can restart.   - continue singulair, as needed Allegra and Nasonex - make sure to take Allegra on the days of your allergy shot - bring Epipen with you on days of your allergy shot.   Follow-up 6 months  I appreciate the opportunity to take part in Ladaja's care. Please do not hesitate to contact me with questions.  Sincerely,   Margo AyeShaylar Lanett Lasorsa, MD Allergy/Immunology [facility]

## 2016-05-10 ENCOUNTER — Ambulatory Visit (INDEPENDENT_AMBULATORY_CARE_PROVIDER_SITE_OTHER): Payer: 59 | Admitting: *Deleted

## 2016-05-10 DIAGNOSIS — J309 Allergic rhinitis, unspecified: Secondary | ICD-10-CM

## 2016-05-18 ENCOUNTER — Other Ambulatory Visit: Payer: Self-pay | Admitting: Allergy

## 2016-05-24 DIAGNOSIS — Z23 Encounter for immunization: Secondary | ICD-10-CM | POA: Diagnosis not present

## 2016-06-08 ENCOUNTER — Ambulatory Visit (INDEPENDENT_AMBULATORY_CARE_PROVIDER_SITE_OTHER): Payer: 59 | Admitting: *Deleted

## 2016-06-08 DIAGNOSIS — J309 Allergic rhinitis, unspecified: Secondary | ICD-10-CM

## 2016-07-06 ENCOUNTER — Ambulatory Visit (INDEPENDENT_AMBULATORY_CARE_PROVIDER_SITE_OTHER): Payer: 59 | Admitting: *Deleted

## 2016-07-06 DIAGNOSIS — J309 Allergic rhinitis, unspecified: Secondary | ICD-10-CM

## 2016-08-04 ENCOUNTER — Ambulatory Visit (INDEPENDENT_AMBULATORY_CARE_PROVIDER_SITE_OTHER): Payer: 59 | Admitting: *Deleted

## 2016-08-04 DIAGNOSIS — J309 Allergic rhinitis, unspecified: Secondary | ICD-10-CM | POA: Diagnosis not present

## 2016-11-14 ENCOUNTER — Other Ambulatory Visit: Payer: Self-pay | Admitting: Obstetrics and Gynecology

## 2016-11-14 DIAGNOSIS — Z1231 Encounter for screening mammogram for malignant neoplasm of breast: Secondary | ICD-10-CM

## 2016-11-21 ENCOUNTER — Other Ambulatory Visit: Payer: Self-pay | Admitting: Allergy

## 2016-11-29 ENCOUNTER — Ambulatory Visit
Admission: RE | Admit: 2016-11-29 | Discharge: 2016-11-29 | Disposition: A | Discharge status: Home / Self Care | Payer: 59 | Source: Ambulatory Visit | Attending: Obstetrics and Gynecology | Admitting: Obstetrics and Gynecology

## 2016-11-29 DIAGNOSIS — Z1231 Encounter for screening mammogram for malignant neoplasm of breast: Secondary | ICD-10-CM

## 2016-12-08 DIAGNOSIS — Z01419 Encounter for gynecological examination (general) (routine) without abnormal findings: Secondary | ICD-10-CM | POA: Diagnosis not present

## 2016-12-23 ENCOUNTER — Other Ambulatory Visit: Payer: Self-pay | Admitting: Allergy

## 2017-01-01 ENCOUNTER — Other Ambulatory Visit: Payer: Self-pay | Admitting: Allergy & Immunology

## 2017-01-01 DIAGNOSIS — M545 Low back pain: Secondary | ICD-10-CM | POA: Diagnosis not present

## 2017-01-01 DIAGNOSIS — M5416 Radiculopathy, lumbar region: Secondary | ICD-10-CM | POA: Diagnosis not present

## 2017-02-04 ENCOUNTER — Other Ambulatory Visit: Payer: Self-pay | Admitting: Allergy & Immunology

## 2017-02-15 DIAGNOSIS — J0140 Acute pansinusitis, unspecified: Secondary | ICD-10-CM | POA: Diagnosis not present

## 2017-02-15 DIAGNOSIS — R05 Cough: Secondary | ICD-10-CM | POA: Diagnosis not present

## 2017-02-18 DIAGNOSIS — Z0000 Encounter for general adult medical examination without abnormal findings: Secondary | ICD-10-CM | POA: Diagnosis not present

## 2017-02-18 DIAGNOSIS — Z6841 Body Mass Index (BMI) 40.0 and over, adult: Secondary | ICD-10-CM | POA: Diagnosis not present

## 2017-02-18 DIAGNOSIS — J321 Chronic frontal sinusitis: Secondary | ICD-10-CM | POA: Diagnosis not present

## 2017-02-26 DIAGNOSIS — J0111 Acute recurrent frontal sinusitis: Secondary | ICD-10-CM | POA: Diagnosis not present

## 2017-02-26 DIAGNOSIS — R1033 Periumbilical pain: Secondary | ICD-10-CM | POA: Diagnosis not present

## 2017-02-26 DIAGNOSIS — R509 Fever, unspecified: Secondary | ICD-10-CM | POA: Diagnosis not present

## 2017-02-28 DIAGNOSIS — H40013 Open angle with borderline findings, low risk, bilateral: Secondary | ICD-10-CM | POA: Diagnosis not present

## 2017-03-30 ENCOUNTER — Ambulatory Visit: Payer: 59 | Admitting: Allergy

## 2017-03-30 ENCOUNTER — Encounter: Payer: Self-pay | Admitting: Allergy

## 2017-03-30 VITALS — BP 140/88 | HR 88 | Resp 20

## 2017-03-30 DIAGNOSIS — Z889 Allergy status to unspecified drugs, medicaments and biological substances status: Secondary | ICD-10-CM

## 2017-03-30 DIAGNOSIS — J4540 Moderate persistent asthma, uncomplicated: Secondary | ICD-10-CM | POA: Diagnosis not present

## 2017-03-30 DIAGNOSIS — H1010 Acute atopic conjunctivitis, unspecified eye: Secondary | ICD-10-CM

## 2017-03-30 DIAGNOSIS — J309 Allergic rhinitis, unspecified: Secondary | ICD-10-CM

## 2017-03-30 MED ORDER — MONTELUKAST SODIUM 10 MG PO TABS: ORAL_TABLET | ORAL | 5 refills | Status: DC

## 2017-03-30 MED ORDER — FLUTICASONE PROPIONATE 50 MCG/ACT NA SUSP: NASAL | 3 refills | Status: DC

## 2017-03-30 MED ORDER — FEXOFENADINE HCL 180 MG PO TABS: ORAL_TABLET | ORAL | 3 refills | Status: DC

## 2017-03-30 NOTE — Patient Instructions (Addendum)
Asthma - Continue Airduo 1 puff daily.  Increase to 1 puff twice a day during asthma flares/illness - albuterol as needed 2 puffs every 4-6 hours for cough, wheeze, chest tightness, difficulty breathing - continue singulair 10mg  at bedtime daily Asthma control goals:   Full participation in all desired activities (may need albuterol before activity)  Albuterol use two time or less a week on average (not counting use with activity)  Cough interfering with sleep two time or less a month  Oral steroids no more than once a year  No hospitalizations  Allergies - we will see how you do this spring and summer off allergy shots   - continue singulair as above, as needed Allegra and Nasonex  Drug allergy - your reaction to penicillins were greater than 10 years ago.   - you are likely no longer allergic to penicillins - recommend performing in-office challenge to penicillin.  Recommend scheduling a morning appointment for drug challenge.  Hold all antihistamines for drug challenge at least 3 days prior.   If you do not have stock penicillin in office we will prescribe medication and you will bring to the office for challenge.  Plan to be here at least 2 hours for challenge.     Follow-up 6 months`

## 2017-03-30 NOTE — Progress Notes (Signed)
Follow-up Note  RE: Olivia ScrapeKathrina Y Minervini MRN: [id number] DOB: 01/17/1956 Date of Office Visit: 03/30/2017   History of present illness: Olivia Paul is a 62 y.o. female presenting today for follow-up of asthma and allergies.  She was last seen in the office on 04/28/16 by myself.  At this visit we discussed stopping immunotherapy as she had been on for 7-8 years.  She did stop over the summer and feels she has been doing well off allergy shots.  She does report she was sick in December with what sounds like a sinus infection that turned into asthma exacerbation. She was seen in UC x 2 and by her PCP.  She states she required at least 2 rounds of antibiotics and also received kenalog injection as well as oral steroids.  She is better from this illness.  She states her PCP wanted to use an PCN based antibiotic but since she has a listed allergy she did not want to use this medication.  She states when she was in 2nd grade she recalls taking a PCN antibiotic and developed a rash.  She also thinks she may have had mono at the time.  She has been avoiding PCN antibiotics since that time.   With he asthma she does feel she has been well-controlled except for the illness she had in December.  She did increase her Airduo to 1 puff twice a day during this flare.  She was also using albuterol but does not feel she has had needed to use it outside of the December illness.   She does continue on daily singulair.   She will use allegra and nasonex as needed for allergy relief but has rarely needed to use these medications.    Review of systems: Review of Systems  Constitutional: Negative for chills, fever and malaise/fatigue.  HENT: Negative for congestion, ear discharge, ear pain, nosebleeds, sinus pain, sore throat and tinnitus.   Eyes: Negative for pain, discharge and redness.  Respiratory: Negative for cough, shortness of breath and wheezing.   Cardiovascular: Negative for chest pain.  Gastrointestinal:  Negative for abdominal pain, constipation, diarrhea, nausea and vomiting.  Musculoskeletal: Negative for joint pain.  Skin: Negative for itching and rash.  Neurological: Negative for headaches.    All other systems negative unless noted above in HPI  Past medical/social/surgical/family history have been reviewed and are unchanged unless specifically indicated below.  No changes  Medication List: Allergies as of 03/30/2017      Reactions   Erythromycin    Oxycodone    Penicillins    Sulfa Antibiotics       Medication List        Accurate as of 03/30/17  6:22 PM. Always use your most recent med list.          cetirizine 10 MG tablet Commonly known as:  ZYRTEC Take 1 tablet (10 mg total) by mouth at bedtime.   EPINEPHrine 0.3 mg/0.3 mL Soaj injection Commonly known as:  EPI-PEN USE AS DIRECTED IN THE EVENT OF A SEVERE LIFE THREATNING ALLERGIC REACTION.   fexofenadine 180 MG tablet Commonly known as:  ALLEGRA TAKE ONE TABLET ONCE DAILY FOR RUNNY NOSE OR ITCHING   fluticasone 50 MCG/ACT nasal spray Commonly known as:  FLONASE 2 sprays in each nostril once daily   Fluticasone-Salmeterol 232-14 MCG/ACT Aepb Commonly known as:  AIRDUO RESPICLICK 232/14 Inhale 1 puff into the lungs daily.   montelukast 10 MG tablet Commonly known as:  SINGULAIR  TAKE 1 TABLET BY MOUTH EVERYDAY AT BEDTIME   tolterodine 4 MG 24 hr capsule Commonly known as:  DETROL LA Take 4 mg by mouth daily.   TOVIAZ 8 MG Tb24 tablet Generic drug:  fesoterodine Take 8 mg by mouth daily.       Known medication allergies: Allergies  Allergen Reactions  . Erythromycin   . Oxycodone   . Penicillins   . Sulfa Antibiotics      Physical examination: Blood pressure 140/88, pulse 88, resp. rate 20.  General: Alert, interactive, in no acute distress. HEENT: PERRLA, TMs pearly gray, turbinates non-edematous without discharge, post-pharynx non erythematous. Neck: Supple without  lymphadenopathy. Lungs: Clear to auscultation without wheezing, rhonchi or rales. {no increased work of breathing. CV: Normal S1, S2 without murmurs. Abdomen: Nondistended, nontender. Skin: Warm and dry, without lesions or rashes. Extremities:  No clubbing, cyanosis or edema. Neuro:   Grossly intact.  Diagnositics/Labs  Spirometry: FEV1: 1.53L  92%, FVC: 1.65L  77%, ratio consistent with nonobstructive pattern.  normal for age  Assessment and plan:   Asthma, moderate persistent - Continue Airduo 1 puff daily.  Increase to 1 puff twice a day during asthma flares/illness - albuterol as needed 2 puffs every 4-6 hours for cough, wheeze, chest tightness, difficulty breathing - continue singulair 10mg  at bedtime daily Asthma control goals:   Full participation in all desired activities (may need albuterol before activity)  Albuterol use two time or less a week on average (not counting use with activity)  Cough interfering with sleep two time or less a month  Oral steroids no more than once a year  No hospitalizations  Allergic rhiniconjunctivitis - doing well off immunotherapy. We will see how she does this spring and summer off immunotherapy   - continue singulair as above, as needed Allegra and Nasonex  Drug allergy - your reaction to penicillins was greater than 10 years ago.   - you are likely no longer allergic to penicillins - recommend performing in-office challenge to penicillin.  Recommend scheduling a morning appointment for drug challenge.  Hold all antihistamines for drug challenge at least 3 days prior.   If you do not have stock penicillin in office we will prescribe medication and you will bring to the office for challenge.  Plan to be here at least 2 hours for challenge.     Follow-up 6 months`   I appreciate the opportunity to take part in Retha's care. Please do not hesitate to contact me with questions.  Sincerely,   Margo Aye,  MD Allergy/Immunology [facility]

## 2017-03-31 NOTE — Addendum Note (Signed)
Addended by: Mliss FritzBLACK, Bonnetta Allbee I on: 03/31/2017 07:00 AM   Modules accepted: Orders

## 2017-05-11 ENCOUNTER — Ambulatory Visit: Payer: 59 | Admitting: Family Medicine

## 2017-05-11 ENCOUNTER — Encounter: Payer: Self-pay | Admitting: Family Medicine

## 2017-05-11 VITALS — BP 130/82 | HR 80 | Resp 20

## 2017-05-11 DIAGNOSIS — J4540 Moderate persistent asthma, uncomplicated: Secondary | ICD-10-CM | POA: Insufficient documentation

## 2017-05-11 DIAGNOSIS — Z889 Allergy status to unspecified drugs, medicaments and biological substances status: Secondary | ICD-10-CM | POA: Insufficient documentation

## 2017-05-11 DIAGNOSIS — H1010 Acute atopic conjunctivitis, unspecified eye: Secondary | ICD-10-CM

## 2017-05-11 DIAGNOSIS — J309 Allergic rhinitis, unspecified: Secondary | ICD-10-CM

## 2017-05-11 NOTE — Progress Notes (Signed)
[address] [place] [address] Dept: [phone]  FOLLOW UP NOTE  Patient ID: Olivia Paul, female    DOB: 03/09/1955  Age: 62 y.o. MRN: [id number] Date of Office Visit: 05/11/2017  Assessment  Chief Complaint: Drug challange (PCN)  HPI Olivia ScrapeKathrina Y Cassel is a 62 year old female who presents to the clinic for a follow up oral penicillin challenge. She was last seen in this clinic on 03/30/2017 by Dr. Delorse LekPadgett for evaluation of asthma, allergic rhinitis, and penicillin allergy. At that time, she was continued on her AirDuo.one puff once a day, montelukast 10 mg once a day, Allegra 180 mg once a day and Flonase nasal spray 2 sprays in each nostril once a day as needed.   At today's visit, she reports she is feeing well with no fever or asthma symptoms. She has not taken any antihistamine since Sunday 4 days ago, and is ready to proceed with penicillin oral challenge.   Drug Allergies:  Allergies  Allergen Reactions  . Erythromycin   . Oxycodone   . Sulfa Antibiotics     Physical Exam: BP 130/82   Pulse 80   Resp 20    Physical Exam  Constitutional: She is oriented to person, place, and time. She appears well-developed and well-nourished.  HENT:  Head: Normocephalic and atraumatic.  Right Ear: External ear normal.  Left Ear: External ear normal.  Nose: Nose normal.  Mouth/Throat: Oropharynx is clear and moist.  Eyes: Conjunctivae are normal.  Neck: Normal range of motion. Neck supple.  Cardiovascular: Normal rate, regular rhythm and normal heart sounds.  Pulmonary/Chest: Effort normal and breath sounds normal.  Musculoskeletal: Normal range of motion.  Neurological: She is alert and oriented to person, place, and time.  Skin: Skin is warm and dry.  No rash noted on exam.  Psychiatric: She has a normal mood and affect. Her behavior is normal. Judgment and thought content normal.    Diagnostics: FVC 1.66, FEV1 1.45. Predicted FVC 2.13, predicted FEV1 1.66.  Spirometry indicates mild restriction.   Assessment and Plan: 1. Drug allergy   2. Allergic rhinoconjunctivitis   3. Moderate persistent asthma, uncomplicated      Patient Instructions  Asthma - Continue Airduo 1 puff daily.  Increase to 1 puff twice a day during asthma flares/illness - albuterol as needed 2 puffs every 4-6 hours for cough, wheeze, chest tightness, difficulty breathing - continue singulair 10mg  at bedtime daily Asthma control goals:   Full participation in all desired activities (may need albuterol before activity)  Albuterol use two time or less a week on average (not counting use with activity)  Cough interfering with sleep two time or less a month  Oral steroids no more than once a year  No hospitalizations  Allergies - we will see how you do this spring and summer off allergy shots   - continue singulair as above, as needed Allegra and Nasonex  Drug allergy -Open graded penecillin oral challenge: The patient was able to tolerate the challenge today without adverse signs or symptoms. Vital signs were stable throughout the challenge and observation period. She received multiple doses separated by 20 minutes, each of which was separated by vitals and a brief physical exam. She received the following doses: 50 mg, 100 mg, and 450 mg. She was monitored for 60 minutes following the last dose.   The patient was able to tolerate the open graded oral challenge today without adverse signs or symptoms. Therefore, she has the  same risk of systemic reaction associated with the consumption of penicillin as the general population.  Follow-up in 5 months or sooner if needed   Return in about 5 months (around 10/11/2017), or if symptoms worsen or fail to improve.    Thank you for the opportunity to care for this patient.  Please do not hesitate to contact me with questions.  Thermon Leyland, FNP [facility]

## 2017-05-11 NOTE — Patient Instructions (Addendum)
Asthma - Continue Airduo 1 puff daily.  Increase to 1 puff twice a day during asthma flares/illness - albuterol as needed 2 puffs every 4-6 hours for cough, wheeze, chest tightness, difficulty breathing - continue singulair 10mg  at bedtime daily Asthma control goals:   Full participation in all desired activities (may need albuterol before activity)  Albuterol use two time or less a week on average (not counting use with activity)  Cough interfering with sleep two time or less a month  Oral steroids no more than once a year  No hospitalizations  Allergies - we will see how you do this spring and summer off allergy shots   - continue singulair as above, as needed Allegra and Nasonex  Drug allergy -Open graded penecillin oral challenge: The patient was able to tolerate the challenge today without adverse signs or symptoms. Vital signs were stable throughout the challenge and observation period. She received multiple doses separated by 20 minutes, each of which was separated by vitals and a brief physical exam. She received the following doses: 50 mg, 100 mg, and 450 mg. She was monitored for 60 minutes following the last dose.   The patient was able to tolerate the open graded oral challenge today without adverse signs or symptoms. Therefore, she has the same risk of systemic reaction associated with the consumption of penicillin as the general population.  Follow-up in 5 months or sooner if needed

## 2017-05-16 DIAGNOSIS — M25562 Pain in left knee: Secondary | ICD-10-CM | POA: Diagnosis not present

## 2017-05-18 DIAGNOSIS — M25562 Pain in left knee: Secondary | ICD-10-CM | POA: Diagnosis not present

## 2017-05-25 DIAGNOSIS — J45909 Unspecified asthma, uncomplicated: Secondary | ICD-10-CM | POA: Diagnosis not present

## 2017-05-25 DIAGNOSIS — Z6841 Body Mass Index (BMI) 40.0 and over, adult: Secondary | ICD-10-CM | POA: Diagnosis not present

## 2017-07-10 ENCOUNTER — Other Ambulatory Visit: Payer: Self-pay | Admitting: Allergy & Immunology

## 2017-07-13 DIAGNOSIS — Z713 Dietary counseling and surveillance: Secondary | ICD-10-CM | POA: Diagnosis not present

## 2017-07-13 DIAGNOSIS — Z6838 Body mass index (BMI) 38.0-38.9, adult: Secondary | ICD-10-CM | POA: Diagnosis not present

## 2017-07-27 DIAGNOSIS — E6609 Other obesity due to excess calories: Secondary | ICD-10-CM | POA: Diagnosis not present

## 2017-07-27 DIAGNOSIS — E559 Vitamin D deficiency, unspecified: Secondary | ICD-10-CM | POA: Insufficient documentation

## 2017-07-27 DIAGNOSIS — Z6838 Body mass index (BMI) 38.0-38.9, adult: Secondary | ICD-10-CM | POA: Diagnosis not present

## 2017-08-30 DIAGNOSIS — H40013 Open angle with borderline findings, low risk, bilateral: Secondary | ICD-10-CM | POA: Diagnosis not present

## 2017-09-07 DIAGNOSIS — Z713 Dietary counseling and surveillance: Secondary | ICD-10-CM | POA: Diagnosis not present

## 2017-09-07 DIAGNOSIS — Z6837 Body mass index (BMI) 37.0-37.9, adult: Secondary | ICD-10-CM | POA: Diagnosis not present

## 2017-09-26 ENCOUNTER — Encounter: Payer: Self-pay | Admitting: Allergy and Immunology

## 2017-09-26 ENCOUNTER — Ambulatory Visit: Payer: 59 | Admitting: Allergy and Immunology

## 2017-09-26 VITALS — BP 124/76 | HR 84 | Temp 98.8°F | Resp 20

## 2017-09-26 DIAGNOSIS — J3089 Other allergic rhinitis: Secondary | ICD-10-CM | POA: Diagnosis not present

## 2017-09-26 DIAGNOSIS — J4541 Moderate persistent asthma with (acute) exacerbation: Secondary | ICD-10-CM

## 2017-09-26 DIAGNOSIS — H1010 Acute atopic conjunctivitis, unspecified eye: Secondary | ICD-10-CM | POA: Diagnosis not present

## 2017-09-26 DIAGNOSIS — J309 Allergic rhinitis, unspecified: Secondary | ICD-10-CM | POA: Diagnosis not present

## 2017-09-26 MED ORDER — FLUTICASONE-SALMETEROL 232-14 MCG/ACT IN AEPB: 2.0000 | INHALATION_SPRAY | Freq: Two times a day (BID) | RESPIRATORY_TRACT | 5 refills | Status: DC

## 2017-09-26 MED ORDER — METHYLPREDNISOLONE ACETATE 80 MG/ML IJ SUSP
80.0000 mg | Freq: Once | INTRAMUSCULAR | Status: AC
  Administered 2017-09-26: 80 mg via INTRAMUSCULAR

## 2017-09-26 NOTE — Patient Instructions (Addendum)
  1.  DuoNeb delivered in clinic today  2.  Depo-Medrol 80 IM and prednisone 40mg  delivered in clinic today  3.  Increase Fluticasone-Salmeterol 232-14 to 2 inhalations twice a day. Can decrease to one time per day when doing well  4.  Continue nasal fluticasone 1-2 sprays each nostril daily  5.  Continue montelukast 10 mg daily  6.  Continue fexofenadine 180 mg 1 time per day if needed  7.  Continue Proventil HFA or similar 2 inhalations every 4-6 hours if needed  8.  Obtain full flu vaccine  9.  Return to clinic in December 2019 or earlier if problem

## 2017-09-26 NOTE — Progress Notes (Signed)
Follow-up Note  Referring Provider: Dorothyann PengSanders, Robyn, MD Primary Provider: Dorothyann PengSanders, Robyn, MD Date of Office Visit: 09/26/2017  Subjective:   Olivia Paul (DOB: 07/13/1955) is a 62 y.o. female who [facility] in re-evaluation of the following:  HPI: Olivia Paul presents to this clinic in evaluation of asthma.  She has not been seen in this clinic since 11 May 2017.  She carries the diagnosis of asthma and utilizes air duo 232 at a dose of 1 inhalation 1 time per day on most days and rarely uses a short acting bronchodilator for the most part has had very good control of her asthma for a year.  Unfortunately, about 2 weeks ago she developed coughing and wheezing and shortness of breath and has been using her bronchodilator about every 4 hours.  She does respond transiently to the use of her bronchodilator.  She has not had any chest pain or fever or upper airway symptoms or throat problems or reflux problems or sputum production.  There is not really an obvious provoking factor giving rise to this flareup.  She does have a history of allergic rhinitis that is under very good control with the use of a nasal steroid at this point.  It should be noted that her ophthalmologist noticed that her eye pressure is elevated and he questioned whether or not she was using nasal steroids or inhaled steroids.  To date she has not received any therapy for glaucoma.  Allergies as of 09/26/2017      Reactions   Erythromycin    Oxycodone    Sulfa Antibiotics       Medication List      albuterol 108 (90 Base) MCG/ACT inhaler Commonly known as:  PROVENTIL HFA;VENTOLIN HFA Inhale into the lungs.   EPINEPHrine 0.3 mg/0.3 mL Soaj injection Commonly known as:  EPI-PEN USE AS DIRECTED IN THE EVENT OF A SEVERE LIFE THREATNING ALLERGIC REACTION.   fexofenadine 180 MG tablet Commonly known as:  ALLEGRA TAKE ONE TABLET ONCE DAILY FOR RUNNY NOSE OR ITCHING     fluticasone 50 MCG/ACT nasal spray Commonly known as:  FLONASE 2 sprays in each nostril once daily   Fluticasone-Salmeterol 232-14 MCG/ACT Aepb INHALE 1 PUFF INTO THE LUNGS DAILY.   montelukast 10 MG tablet Commonly known as:  SINGULAIR TAKE 1 TABLET BY MOUTH EVERYDAY AT BEDTIME   tolterodine 4 MG 24 hr capsule Commonly known as:  DETROL LA Take 4 mg by mouth daily.       Past Medical History:  Diagnosis Date  . Asthma   . Recurrent upper respiratory infection (URI)     Past Surgical History:  Procedure Laterality Date  . BREAST BIOPSY    . BREAST EXCISIONAL BIOPSY      Review of systems negative except as noted in HPI / PMHx or noted below:  Review of Systems  Constitutional: Negative.   HENT: Negative.   Eyes: Negative.   Respiratory: Negative.   Cardiovascular: Negative.   Gastrointestinal: Negative.   Genitourinary: Negative.   Musculoskeletal: Negative.   Skin: Negative.   Neurological: Negative.   Endo/Heme/Allergies: Negative.   Psychiatric/Behavioral: Negative.      Objective:   Vitals:   09/26/17 1718  BP: 124/76  Pulse: 84  Resp: 20  Temp: 98.8 F (37.1 C)  SpO2: 95%          Physical Exam  Constitutional:  Coughing  HENT:  Head: Normocephalic.  Right  Ear: Tympanic membrane, external ear and ear canal normal.  Left Ear: Tympanic membrane, external ear and ear canal normal.  Nose: Nose normal. No mucosal edema or rhinorrhea.  Mouth/Throat: Uvula is midline, oropharynx is clear and moist and mucous membranes are normal. No oropharyngeal exudate.  Eyes: Conjunctivae are normal.  Neck: Trachea normal. No tracheal tenderness present. No tracheal deviation present. No thyromegaly present.  Cardiovascular: Normal rate, regular rhythm, S1 normal, S2 normal and normal heart sounds.  No murmur heard. Pulmonary/Chest: Breath sounds normal. No stridor. No respiratory distress. She has no wheezes (Inspiratory and expiratory wheezes posterior  lung field). She has no rales.  Musculoskeletal: She exhibits no edema.  Lymphadenopathy:       Head (right side): No tonsillar adenopathy present.       Head (left side): No tonsillar adenopathy present.    She has no cervical adenopathy.  Neurological: She is alert.  Skin: No rash noted. She is not diaphoretic. No erythema. Nails show no clubbing.    Diagnostics:    Spirometry was performed and demonstrated an FEV1 of 1.19 at 66 % of predicted.  The patient had an Asthma Control Test with the following results:  .    Assessment and Plan:   1. Asthma, not well controlled, moderate persistent, with acute exacerbation   2. Perennial allergic rhinitis   3. Allergic rhinoconjunctivitis     1.  DuoNeb delivered in clinic today  2.  Depo-Medrol 80 IM and Prednisone 40 mg delivered in clinic today  3.  Increase Fluticasone-Salmeterol 232-14 to 2 inhalations twice a day.  Can decrease to 1 time per day when doing well  4.  Continue nasal fluticasone 1-2 sprays each nostril daily  5.  Continue montelukast 10 mg daily  6.  Continue fexofenadine 180 mg 1 time per day if needed  7.  Continue Proventil HFA or similar 2 inhalations every 4-6 hours if needed  8.  Obtain full flu vaccine  9.  Return to clinic in December 2019 or earlier if problem  Olivia Paul appears to have inflammation of her lower airway with unknown trigger for this most recent exacerbation.  For now we will treat her with systemic anti-inflammatory medications and have her increase her dose of inhaled steroid and long-acting bronchodilator to full dose.  Assuming she does well we will see her back in this clinic in December 2019 or earlier if there is a problem.  Laurette SchimkeEric Kozlow, MD Allergy / Immunology [facility]

## 2017-09-27 ENCOUNTER — Encounter: Payer: Self-pay | Admitting: Allergy and Immunology

## 2017-11-23 ENCOUNTER — Other Ambulatory Visit: Payer: Self-pay | Admitting: Obstetrics and Gynecology

## 2017-11-23 DIAGNOSIS — Z1231 Encounter for screening mammogram for malignant neoplasm of breast: Secondary | ICD-10-CM

## 2017-12-03 ENCOUNTER — Other Ambulatory Visit: Payer: Self-pay | Admitting: Allergy

## 2017-12-12 DIAGNOSIS — Z01411 Encounter for gynecological examination (general) (routine) with abnormal findings: Secondary | ICD-10-CM | POA: Diagnosis not present

## 2017-12-15 DIAGNOSIS — N3941 Urge incontinence: Secondary | ICD-10-CM | POA: Diagnosis not present

## 2017-12-25 ENCOUNTER — Ambulatory Visit: Payer: 59

## 2017-12-26 ENCOUNTER — Ambulatory Visit: Payer: 59

## 2018-01-15 ENCOUNTER — Ambulatory Visit
Admission: RE | Admit: 2018-01-15 | Discharge: 2018-01-15 | Disposition: A | Discharge status: Home / Self Care | Payer: 59 | Source: Ambulatory Visit | Attending: Obstetrics and Gynecology | Admitting: Obstetrics and Gynecology

## 2018-01-15 DIAGNOSIS — Z1231 Encounter for screening mammogram for malignant neoplasm of breast: Secondary | ICD-10-CM | POA: Diagnosis not present

## 2018-01-30 DIAGNOSIS — M25562 Pain in left knee: Secondary | ICD-10-CM | POA: Diagnosis not present

## 2018-02-01 ENCOUNTER — Other Ambulatory Visit: Payer: Self-pay | Admitting: Allergy

## 2018-02-02 ENCOUNTER — Encounter: Payer: Self-pay | Admitting: Family Medicine

## 2018-02-02 ENCOUNTER — Ambulatory Visit: Payer: 59 | Admitting: Family Medicine

## 2018-02-02 VITALS — BP 134/92 | HR 102 | Resp 18 | Ht 60.0 in | Wt 192.0 lb

## 2018-02-02 DIAGNOSIS — M25562 Pain in left knee: Secondary | ICD-10-CM | POA: Diagnosis not present

## 2018-02-02 DIAGNOSIS — J309 Allergic rhinitis, unspecified: Secondary | ICD-10-CM | POA: Diagnosis not present

## 2018-02-02 DIAGNOSIS — J4541 Moderate persistent asthma with (acute) exacerbation: Secondary | ICD-10-CM | POA: Diagnosis not present

## 2018-02-02 DIAGNOSIS — J4520 Mild intermittent asthma, uncomplicated: Secondary | ICD-10-CM | POA: Diagnosis not present

## 2018-02-02 DIAGNOSIS — J3089 Other allergic rhinitis: Secondary | ICD-10-CM | POA: Diagnosis not present

## 2018-02-02 DIAGNOSIS — H1010 Acute atopic conjunctivitis, unspecified eye: Secondary | ICD-10-CM | POA: Diagnosis not present

## 2018-02-02 MED ORDER — ALBUTEROL SULFATE (2.5 MG/3ML) 0.083% IN NEBU: INHALATION_SOLUTION | RESPIRATORY_TRACT | 5 refills | Status: DC

## 2018-02-02 MED ORDER — METHYLPREDNISOLONE ACETATE 80 MG/ML IJ SUSP
80.0000 mg | Freq: Once | INTRAMUSCULAR | Status: AC
  Administered 2018-02-02: 80 mg via INTRAMUSCULAR

## 2018-02-02 NOTE — Patient Instructions (Addendum)
  1.  DuoNeb delivered in clinic today  2.  Depo-Medrol 80 IM delivered in clinic today.   3.  Continue Fluticasone-Salmeterol 232-14 to 2 inhalations twice a day. Can decrease to one time per day when doing well  4.  Continue nasal fluticasone 1-2 sprays each nostril daily. Consider nasal saline rinses daily  5.  Continue montelukast 10 mg daily  6.  Continue fexofenadine 180 mg 1 time per day if needed  7.  Continue Proventil HFA or similar 2 inhalations every 4-6 hours if needed or instead albuterol 0.083% one unit vial via nebulizer every 4-6 hours as needed for cough or wheeze  8.  Return to clinic in 2 months or sooner if needed. Call us if this treatment plan is not working well for you, if your symptoms worsen or do not improve

## 2018-02-02 NOTE — Progress Notes (Signed)
[address] [place] [id number] Dept: [phone]  FOLLOW UP NOTE  Patient ID: Olivia Paul, female    DOB: 09/27/1955  Age: 62 y.o. MRN: [id number] Date of Office Visit: 02/02/2018  Assessment  Chief Complaint: Cough and Asthma  HPI Olivia Paul is a 62 year old female who presents to the clinic for a sick visit. She was last seen in this clinic on 09/26/2017 by Dr. Lucie LeatherKozlow for asthma with acute exacerbation, allergic rhinitis, and allergic conjunctivitis. At that time, she required a DepoMedrol injection in the office in addition to oral prednisone for resolution of her asthma exacerbation. At today's visit, she reports that about 2 weeks ago she began to experience a dry cough and wheeze which worsened about 1 week ago. She reports she has increased her DuoAir to 2 puffs twice a day last week, continues montelukast 10 mg once a day and has used her albuterol 3 times a day for the last week. She denies runny nose and post nasal drainage. She continues Flonase and Allegra once a day and occasionally takes a cough syrup which is not helping with the cough. She denies fever and sick contacts. Her current medications are listed in the chart.    Drug Allergies:  Allergies  Allergen Reactions  . Erythromycin   . Sulfa Antibiotics     Physical Exam: BP (!) 134/92 (BP Location: Left Arm, Patient Position: Sitting, Cuff Size: Normal)   Pulse (!) 102   Resp 18   Ht 5' (1.524 m)   Wt 192 lb (87.1 kg)   SpO2 97%   BMI 37.50 kg/m    Physical Exam Constitutional:      Appearance: Normal appearance.  HENT:     Head: Normocephalic and atraumatic.     Right Ear: Tympanic membrane normal.     Left Ear: Tympanic membrane normal.     Nose:     Comments: Bilateral nares erythematous with no nasal drainage noted. Pharynx normal. Ears normal. Eyes normal    Mouth/Throat:     Pharynx: Oropharynx is clear.  Eyes:     Conjunctiva/sclera: Conjunctivae normal.  Neck:   Musculoskeletal: Normal range of motion and neck supple.  Cardiovascular:     Rate and Rhythm: Normal rate and regular rhythm.     Heart sounds: Normal heart sounds. No murmur.  Pulmonary:     Effort: Pulmonary effort is normal.     Comments: Scattered bilateral expiratory wheezing which cleared slightly post bronchodilator therapy.  Musculoskeletal: Normal range of motion.  Skin:    General: Skin is warm and dry.  Neurological:     Mental Status: She is alert and oriented to person, place, and time.  Psychiatric:        Mood and Affect: Mood normal.        Behavior: Behavior normal.        Thought Content: Thought content normal.        Judgment: Judgment normal.     Diagnostics: FVC 1.47, FEV1 1.33. Predicted FVC 2.31, predicted FEV1 1.81. Spirometry indicates moderate restriction. Post bronchodilator therapy FVC 1.58, FEV1 1.46. Post bronchodilator therapy spirometry indicates mild restriction with 10% improvement in FEV1.   Assessment and Plan: 1. Moderate persistent asthma with acute exacerbation   2. Perennial allergic rhinitis   3. Allergic rhinoconjunctivitis     Meds ordered this encounter  Medications  . albuterol (PROVENTIL) (2.5 MG/3ML) 0.083% nebulizer solution    Sig: Use 1 vial every 4-6  hours as needed for cough, wheeze, shortness of breath, or chest tightness    Dispense:  180 mL    Refill:  5  . methylPREDNISolone acetate (DEPO-MEDROL) injection 80 mg    Patient Instructions   1.  DuoNeb delivered in clinic today  2.  Depo-Medrol 80 IM delivered in clinic today.   3.  Continue Fluticasone-Salmeterol 232-14 to 2 inhalations twice a day. Can decrease to one time per day when doing well  4.  Continue nasal fluticasone 1-2 sprays each nostril daily. Consider nasal saline rinses daily  5.  Continue montelukast 10 mg daily  6.  Continue fexofenadine 180 mg 1 time per day if needed  7.  Continue Proventil HFA or similar 2 inhalations every 4-6 hours if  needed or instead albuterol 0.083% one unit vial via nebulizer every 4-6 hours as needed for cough or wheeze  8.  Return to clinic in 2 months or sooner if needed. Call us if this treatment plan is not working well for you, if your symptoms worsen or do not improve   Return in about 2 months (around 04/05/2018), or if symptoms worsen or fail to improve.    Thank you for the opportunity to care for this patient.  Please do not hesitate to contact me with questions.  Thermon LeylandAnne Robi Dewolfe, FNP [facility]

## 2018-02-05 ENCOUNTER — Telehealth: Payer: Self-pay | Admitting: *Deleted

## 2018-02-05 MED ORDER — PREDNISONE 10 MG PO TABS: ORAL_TABLET | ORAL | 0 refills | Status: DC

## 2018-02-05 NOTE — Telephone Encounter (Signed)
Spoke to patient advised of plan patient verbalized understanding. rx sent in

## 2018-02-05 NOTE — Telephone Encounter (Signed)
Anne please advise. 

## 2018-02-05 NOTE — Telephone Encounter (Signed)
Patient called stating she seen Thurston HoleAnne Friday, patient is still coughing and would like [facility] [place] [address]. Please advise

## 2018-02-05 NOTE — Telephone Encounter (Signed)
Please call in 20 mg once a day for 4 days, then 10 mg on day 5 then stop. When you call her to let her know the medication has been ordered can you please remind her to check in with her PCP to be sure she is getting the appropriate bone density monitoring for her age and frequent prednisone use? Thank you.

## 2018-02-06 DIAGNOSIS — M25562 Pain in left knee: Secondary | ICD-10-CM | POA: Diagnosis not present

## 2018-02-06 DIAGNOSIS — M1711 Unilateral primary osteoarthritis, right knee: Secondary | ICD-10-CM | POA: Diagnosis not present

## 2018-02-19 ENCOUNTER — Other Ambulatory Visit: Payer: Self-pay | Admitting: *Deleted

## 2018-02-19 MED ORDER — ALBUTEROL SULFATE HFA 108 (90 BASE) MCG/ACT IN AERS: 2.0000 | INHALATION_SPRAY | RESPIRATORY_TRACT | 1 refills | Status: DC | PRN

## 2018-02-21 DIAGNOSIS — M94262 Chondromalacia, left knee: Secondary | ICD-10-CM | POA: Diagnosis not present

## 2018-02-21 DIAGNOSIS — M23222 Derangement of posterior horn of medial meniscus due to old tear or injury, left knee: Secondary | ICD-10-CM | POA: Diagnosis not present

## 2018-02-21 DIAGNOSIS — G8918 Other acute postprocedural pain: Secondary | ICD-10-CM | POA: Diagnosis not present

## 2018-02-21 HISTORY — PX: KNEE ARTHROSCOPY WITH MENISCAL REPAIR: SHX5653

## 2018-02-22 DIAGNOSIS — M23222 Derangement of posterior horn of medial meniscus due to old tear or injury, left knee: Secondary | ICD-10-CM | POA: Diagnosis not present

## 2018-03-02 DIAGNOSIS — M6281 Muscle weakness (generalized): Secondary | ICD-10-CM | POA: Diagnosis not present

## 2018-03-02 DIAGNOSIS — M25662 Stiffness of left knee, not elsewhere classified: Secondary | ICD-10-CM | POA: Diagnosis not present

## 2018-03-05 DIAGNOSIS — J4520 Mild intermittent asthma, uncomplicated: Secondary | ICD-10-CM | POA: Diagnosis not present

## 2018-03-07 DIAGNOSIS — M25662 Stiffness of left knee, not elsewhere classified: Secondary | ICD-10-CM | POA: Diagnosis not present

## 2018-03-07 DIAGNOSIS — M6281 Muscle weakness (generalized): Secondary | ICD-10-CM | POA: Diagnosis not present

## 2018-03-13 DIAGNOSIS — M25662 Stiffness of left knee, not elsewhere classified: Secondary | ICD-10-CM | POA: Diagnosis not present

## 2018-03-13 DIAGNOSIS — H40013 Open angle with borderline findings, low risk, bilateral: Secondary | ICD-10-CM | POA: Diagnosis not present

## 2018-03-13 DIAGNOSIS — M6281 Muscle weakness (generalized): Secondary | ICD-10-CM | POA: Diagnosis not present

## 2018-03-15 DIAGNOSIS — M25662 Stiffness of left knee, not elsewhere classified: Secondary | ICD-10-CM | POA: Diagnosis not present

## 2018-03-15 DIAGNOSIS — M6281 Muscle weakness (generalized): Secondary | ICD-10-CM | POA: Diagnosis not present

## 2018-03-20 DIAGNOSIS — M25662 Stiffness of left knee, not elsewhere classified: Secondary | ICD-10-CM | POA: Diagnosis not present

## 2018-03-20 DIAGNOSIS — M6281 Muscle weakness (generalized): Secondary | ICD-10-CM | POA: Diagnosis not present

## 2018-03-22 DIAGNOSIS — M6281 Muscle weakness (generalized): Secondary | ICD-10-CM | POA: Diagnosis not present

## 2018-03-22 DIAGNOSIS — M25662 Stiffness of left knee, not elsewhere classified: Secondary | ICD-10-CM | POA: Diagnosis not present

## 2018-03-23 DIAGNOSIS — H40013 Open angle with borderline findings, low risk, bilateral: Secondary | ICD-10-CM | POA: Diagnosis not present

## 2018-03-23 DIAGNOSIS — H524 Presbyopia: Secondary | ICD-10-CM | POA: Diagnosis not present

## 2018-03-23 DIAGNOSIS — H538 Other visual disturbances: Secondary | ICD-10-CM | POA: Diagnosis not present

## 2018-03-26 ENCOUNTER — Encounter: Payer: Self-pay | Admitting: Internal Medicine

## 2018-03-26 ENCOUNTER — Ambulatory Visit: Payer: 59 | Admitting: Internal Medicine

## 2018-03-26 VITALS — BP 116/82 | HR 90 | Temp 98.5°F | Ht 59.0 in | Wt 190.6 lb

## 2018-03-26 DIAGNOSIS — F419 Anxiety disorder, unspecified: Secondary | ICD-10-CM | POA: Insufficient documentation

## 2018-03-26 DIAGNOSIS — Z6838 Body mass index (BMI) 38.0-38.9, adult: Secondary | ICD-10-CM | POA: Diagnosis not present

## 2018-03-26 DIAGNOSIS — H0263 Xanthelasma of right eye, unspecified eyelid: Secondary | ICD-10-CM | POA: Insufficient documentation

## 2018-03-26 DIAGNOSIS — Z0000 Encounter for general adult medical examination without abnormal findings: Secondary | ICD-10-CM | POA: Diagnosis not present

## 2018-03-26 LAB — POCT URINALYSIS DIPSTICK
Bilirubin, UA: NEGATIVE
Blood, UA: NEGATIVE
Glucose, UA: NEGATIVE
Ketones, UA: NEGATIVE
Nitrite, UA: NEGATIVE
Protein, UA: NEGATIVE
Spec Grav, UA: 1.02 (ref 1.010–1.025)
UROBILINOGEN UA: 0.2 U/dL
pH, UA: 6 (ref 5.0–8.0)

## 2018-03-26 NOTE — Progress Notes (Signed)
Subjective:     Patient ID: Olivia Paul , female    DOB: 01/09/1956 , 63 y.o.   MRN: [id number]   Chief Complaint  Patient presents with  . Annual Exam    HPI  She is here today for a full physical examination. She is followed by GYN for her pelvic exams.     Past Medical History:  Diagnosis Date  . Asthma   . Recurrent upper respiratory infection (URI)      Family History  Problem Relation Age of Onset  . Allergic rhinitis Mother   . Allergic rhinitis Father   . Asthma Father   . Urticaria Sister   . Breast cancer Maternal Aunt   . Breast cancer Paternal Aunt   . Breast cancer Paternal Grandmother   . Angioedema Neg Hx   . Atopy Neg Hx   . Eczema Neg Hx   . Immunodeficiency Neg Hx      Current Outpatient Medications:  .  albuterol (PROVENTIL HFA;VENTOLIN HFA) 108 (90 Base) MCG/ACT inhaler, Inhale 2 puffs into the lungs every 4 (four) hours as needed for wheezing or shortness of breath., Disp: 18 g, Rfl: 1 .  albuterol (PROVENTIL) (2.5 MG/3ML) 0.083% nebulizer solution, Use 1 vial every 4-6 hours as needed for cough, wheeze, shortness of breath, or chest tightness, Disp: 180 mL, Rfl: 5 .  EPINEPHrine 0.3 mg/0.3 mL IJ SOAJ injection, USE AS DIRECTED IN THE EVENT OF A SEVERE LIFE THREATNING ALLERGIC REACTION., Disp: , Rfl:  .  fexofenadine (ALLEGRA) 180 MG tablet, TAKE ONE TABLET ONCE DAILY FOR RUNNY NOSE OR ITCHING, Disp: 90 tablet, Rfl: 3 .  fluticasone (FLONASE) 50 MCG/ACT nasal spray, 2 sprays in each nostril once daily, Disp: 16 g, Rfl: 3 .  Fluticasone-Salmeterol 232-14 MCG/ACT AEPB, Inhale 2 puffs into the lungs 2 (two) times daily., Disp: 1 each, Rfl: 5 .  HYDROcodone-acetaminophen (NORCO) 10-325 MG tablet, Take 1 tablet by mouth every 6 (six) hours as needed., Disp: , Rfl:  .  montelukast (SINGULAIR) 10 MG tablet, TAKE 1 TABLET BY MOUTH EVERYDAY AT BEDTIME, Disp: 30 tablet, Rfl: 5 .  tolterodine (DETROL LA) 4 MG 24 hr capsule, Take 4 mg by mouth daily., Disp:  , Rfl: 2   Allergies  Allergen Reactions  . Erythromycin   . Sulfa Antibiotics       No LMP recorded. Patient is postmenopausal..  Negative for: breast discharge, breast lump(s), breast pain and breast self exam. Associated symptoms include abnormal vaginal bleeding. Pertinent negatives include abnormal bleeding (hematology), anxiety, decreased libido, depression, difficulty falling sleep, dyspareunia, history of infertility, nocturia, sexual dysfunction, sleep disturbances, urinary incontinence, urinary urgency, vaginal discharge and vaginal itching. Diet regular.The patient states her exercise level is  minimal.   . The patient's tobacco use is:  Social History   Tobacco Use  Smoking Status Never Smoker  Smokeless Tobacco Never Used  . She has been exposed to passive smoke. The patient's alcohol use is:  Social History   Substance and Sexual Activity  Alcohol Use No  . Alcohol/week: 0.0 standard drinks   Review of Systems  Constitutional: Negative.   HENT: Negative.   Eyes: Negative.   Respiratory: Negative.   Cardiovascular: Negative.   Gastrointestinal: Negative.   Endocrine: Negative.   Genitourinary: Negative.   Musculoskeletal: Negative.   Skin: Negative.   Allergic/Immunologic: Negative.   Neurological: Negative.   Hematological: Negative.   Psychiatric/Behavioral: The patient is nervous/anxious (She c/o increasing anxiety.  She reports that  she has developed driving anxiety. She reports it started when she started to have brake issues with her car. She took the car to the shop repeatedly, yet she continued to have problems. Her car has been fixed,).      Today's Vitals   03/26/18 0905  BP: 116/82  Pulse: 90  Temp: 98.5 F (36.9 C)  TempSrc: Oral  Weight: 190 lb 9.6 oz (86.5 kg)  Height: '4\' 11"'  (1.499 m)   Body mass index is 38.5 kg/m.   Objective:  Physical Exam Vitals signs and nursing note reviewed.  Constitutional:      Appearance: Normal  appearance. She is obese.  HENT:     Head: Normocephalic and atraumatic.     Right Ear: Tympanic membrane, ear canal and external ear normal.     Left Ear: Tympanic membrane, ear canal and external ear normal.     Nose: Nose normal.     Mouth/Throat:     Mouth: Mucous membranes are moist.     Pharynx: Oropharynx is clear.  Eyes:     Extraocular Movements: Extraocular movements intact.     Conjunctiva/sclera: Conjunctivae normal.     Pupils: Pupils are equal, round, and reactive to light.  Neck:     Musculoskeletal: Normal range of motion and neck supple.  Cardiovascular:     Rate and Rhythm: Normal rate and regular rhythm.     Pulses: Normal pulses.     Heart sounds: Normal heart sounds.  Pulmonary:     Effort: Pulmonary effort is normal.  Chest:     Breasts:        Right: Normal. No swelling, bleeding, inverted nipple, mass or nipple discharge.        Left: No swelling, bleeding, inverted nipple, mass or nipple discharge.  Abdominal:     General: Bowel sounds are normal.     Comments: Obese, difficult to assess organomegaly  Genitourinary:    Comments: deferred Skin:    General: Skin is warm and dry.  Neurological:     General: No focal deficit present.     Mental Status: She is alert and oriented to person, place, and time.  Psychiatric:        Mood and Affect: Mood normal.         Assessment And Plan:     1. Routine general medical examination at health care facility  PATIENT HAS BEEN ADVISED TO GET 30-45 MINUTES REGULAR EXERCISE NO LESS THAN FOUR TO FIVE DAYS PER WEEK - BOTH WEIGHTBEARING EXERCISES AND AEROBIC ARE RECOMMENDED.  SHE IS ADVISED TO FOLLOW A HEALTHY DIET WITH AT LEAST SIX FRUITS/VEGGIES PER DAY, DECREASE INTAKE OF RED MEAT, AND TO INCREASE FISH INTAKE TO TWO DAYS PER WEEK.  MEATS/FISH SHOULD NOT BE FRIED, BAKED OR BROILED IS PREFERABLE.  I SUGGEST WEARING SPF 50 SUNSCREEN ON EXPOSED PARTS AND ESPECIALLY WHEN IN THE DIRECT SUNLIGHT FOR AN EXTENDED PERIOD  OF TIME.  PLEASE AVOID FAST FOOD RESTAURANTS AND INCREASE YOUR WATER INTAKE.  - CMP14+EGFR - CBC - Lipid panel - Hemoglobin A1c - POCT Urinalysis Dipstick (81002)  2. Xanthelasma of right eyelid  Chronic. I will check fasting lipid panel today.   3. Anxiety  She is advised to consider therapy. She has spoken with someone at work, who agrees she should start therapy. She is also advised to start with magnesium nightly. I will also consider adding L-theanine if needed. I am hesitant to start benzodiazepine therapy. She will call me in two  weeks to let me know how she is feeling on the magnesium.   4. Class 2 severe obesity due to excess calories with serious comorbidity and body mass index (BMI) of 38.0 to 38.9 in adult [facility])  Importance of achieving optimal weight to decrease risk of cardiovascular disease and cancers was discussed with the patient in full detail. She is encouraged to start slowly - start with 10 minutes twice daily at least three to four days per week and to gradually build to 30 minutes five days weekly. She was given tips to incorporate more activity into her daily routine - take stairs when possible, park farther away from her job, grocery stores, etc.     Maximino Greenland, MD

## 2018-03-26 NOTE — Patient Instructions (Signed)

## 2018-03-27 DIAGNOSIS — M6281 Muscle weakness (generalized): Secondary | ICD-10-CM | POA: Diagnosis not present

## 2018-03-27 DIAGNOSIS — M25662 Stiffness of left knee, not elsewhere classified: Secondary | ICD-10-CM | POA: Diagnosis not present

## 2018-03-27 LAB — CMP14+EGFR
ALT: 21 IU/L (ref 0–32)
AST: 14 IU/L (ref 0–40)
Albumin/Globulin Ratio: 1.5 (ref 1.2–2.2)
Albumin: 4.5 g/dL (ref 3.8–4.8)
Alkaline Phosphatase: 92 IU/L (ref 39–117)
BUN/Creatinine Ratio: 15 (ref 12–28)
BUN: 12 mg/dL (ref 8–27)
Bilirubin Total: 0.5 mg/dL (ref 0.0–1.2)
CHLORIDE: 102 mmol/L (ref 96–106)
CO2: 22 mmol/L (ref 20–29)
Calcium: 10 mg/dL (ref 8.7–10.3)
Creatinine, Ser: 0.79 mg/dL (ref 0.57–1.00)
GFR calc Af Amer: 93 mL/min/{1.73_m2} (ref >59)
GFR calc non Af Amer: 81 mL/min/{1.73_m2} (ref >59)
Globulin, Total: 3 g/dL (ref 1.5–4.5)
Glucose: 84 mg/dL (ref 65–99)
Potassium: 4.7 mmol/L (ref 3.5–5.2)
Sodium: 144 mmol/L (ref 134–144)
Total Protein: 7.5 g/dL (ref 6.0–8.5)

## 2018-03-27 LAB — LIPID PANEL
Chol/HDL Ratio: 3.4 ratio (ref 0.0–4.4)
Cholesterol, Total: 168 mg/dL (ref 100–199)
HDL: 50 mg/dL (ref >39)
LDL Calculated: 106 mg/dL — ABNORMAL HIGH (ref 0–99)
TRIGLYCERIDES: 60 mg/dL (ref 0–149)
VLDL Cholesterol Cal: 12 mg/dL (ref 5–40)

## 2018-03-27 LAB — CBC
Hematocrit: 41 % (ref 34.0–46.6)
Hemoglobin: 13.9 g/dL (ref 11.1–15.9)
MCH: 28.7 pg (ref 26.6–33.0)
MCHC: 33.9 g/dL (ref 31.5–35.7)
MCV: 85 fL (ref 79–97)
Platelets: 428 10*3/uL (ref 150–450)
RBC: 4.84 x10E6/uL (ref 3.77–5.28)
RDW: 14.5 % (ref 11.7–15.4)
WBC: 11.8 10*3/uL — ABNORMAL HIGH (ref 3.4–10.8)

## 2018-03-27 LAB — HEMOGLOBIN A1C
Est. average glucose Bld gHb Est-mCnc: 120 mg/dL
Hgb A1c MFr Bld: 5.8 % — ABNORMAL HIGH (ref 4.8–5.6)

## 2018-03-29 DIAGNOSIS — M25662 Stiffness of left knee, not elsewhere classified: Secondary | ICD-10-CM | POA: Diagnosis not present

## 2018-03-29 DIAGNOSIS — M6281 Muscle weakness (generalized): Secondary | ICD-10-CM | POA: Diagnosis not present

## 2018-04-05 DIAGNOSIS — M25662 Stiffness of left knee, not elsewhere classified: Secondary | ICD-10-CM | POA: Diagnosis not present

## 2018-04-05 DIAGNOSIS — J4520 Mild intermittent asthma, uncomplicated: Secondary | ICD-10-CM | POA: Diagnosis not present

## 2018-05-01 ENCOUNTER — Other Ambulatory Visit: Payer: Self-pay | Admitting: Allergy

## 2018-05-01 DIAGNOSIS — H1010 Acute atopic conjunctivitis, unspecified eye: Secondary | ICD-10-CM

## 2018-05-01 DIAGNOSIS — J309 Allergic rhinitis, unspecified: Secondary | ICD-10-CM

## 2018-05-03 DIAGNOSIS — M1712 Unilateral primary osteoarthritis, left knee: Secondary | ICD-10-CM | POA: Diagnosis not present

## 2018-05-04 DIAGNOSIS — J4520 Mild intermittent asthma, uncomplicated: Secondary | ICD-10-CM | POA: Diagnosis not present

## 2018-06-04 DIAGNOSIS — J4520 Mild intermittent asthma, uncomplicated: Secondary | ICD-10-CM | POA: Diagnosis not present

## 2018-06-26 DIAGNOSIS — N3941 Urge incontinence: Secondary | ICD-10-CM | POA: Diagnosis not present

## 2018-06-29 DIAGNOSIS — H40013 Open angle with borderline findings, low risk, bilateral: Secondary | ICD-10-CM | POA: Diagnosis not present

## 2018-06-29 DIAGNOSIS — H43821 Vitreomacular adhesion, right eye: Secondary | ICD-10-CM | POA: Diagnosis not present

## 2018-07-04 DIAGNOSIS — J4520 Mild intermittent asthma, uncomplicated: Secondary | ICD-10-CM | POA: Diagnosis not present

## 2018-07-23 ENCOUNTER — Telehealth: Payer: Self-pay | Admitting: Family Medicine

## 2018-07-23 ENCOUNTER — Other Ambulatory Visit: Payer: Self-pay | Admitting: Allergy

## 2018-07-23 DIAGNOSIS — H1010 Acute atopic conjunctivitis, unspecified eye: Secondary | ICD-10-CM

## 2018-07-23 DIAGNOSIS — J309 Allergic rhinitis, unspecified: Secondary | ICD-10-CM

## 2018-07-23 MED ORDER — MONTELUKAST SODIUM 10 MG PO TABS: 10.0000 mg | ORAL_TABLET | Freq: Every day | ORAL | 0 refills | Status: DC

## 2018-07-23 NOTE — Telephone Encounter (Signed)
Refill sent in

## 2018-07-23 NOTE — Telephone Encounter (Signed)
Patient is calling to get a refill on SINGULAIR [facility] [place] [facility] [address] Patient states she comes yearly for her visits and isnt having any current issues

## 2018-08-13 LAB — HM COLONOSCOPY

## 2018-08-20 ENCOUNTER — Other Ambulatory Visit: Payer: Self-pay | Admitting: Allergy and Immunology

## 2018-08-20 DIAGNOSIS — J309 Allergic rhinitis, unspecified: Secondary | ICD-10-CM

## 2018-08-20 DIAGNOSIS — H1010 Acute atopic conjunctivitis, unspecified eye: Secondary | ICD-10-CM

## 2018-08-20 NOTE — Telephone Encounter (Signed)
You wanted patient to follow up in 2 months. Can we refill? Please advise.

## 2018-08-20 NOTE — Telephone Encounter (Signed)
After she makes an appointment for office visit to assess her breathing we can refill montelukast for 30 days please. Thank you

## 2018-08-21 ENCOUNTER — Other Ambulatory Visit: Payer: Self-pay

## 2018-08-21 ENCOUNTER — Telehealth: Payer: Self-pay

## 2018-08-21 MED ORDER — FLUTICASONE-SALMETEROL 232-14 MCG/ACT IN AEPB: 2.0000 | INHALATION_SPRAY | Freq: Two times a day (BID) | RESPIRATORY_TRACT | 0 refills | Status: DC

## 2018-08-21 NOTE — Telephone Encounter (Signed)
See my chart message

## 2018-09-16 ENCOUNTER — Other Ambulatory Visit: Payer: Self-pay | Admitting: Allergy and Immunology

## 2018-09-20 ENCOUNTER — Other Ambulatory Visit: Payer: Self-pay

## 2018-09-20 ENCOUNTER — Encounter: Payer: Self-pay | Admitting: Allergy

## 2018-09-20 ENCOUNTER — Ambulatory Visit: Payer: 59 | Admitting: Allergy

## 2018-09-20 VITALS — BP 140/88 | HR 97 | Temp 97.3°F | Resp 18 | Ht 59.2 in | Wt 197.0 lb

## 2018-09-20 DIAGNOSIS — J3089 Other allergic rhinitis: Secondary | ICD-10-CM

## 2018-09-20 DIAGNOSIS — J4540 Moderate persistent asthma, uncomplicated: Secondary | ICD-10-CM | POA: Diagnosis not present

## 2018-09-20 DIAGNOSIS — L271 Localized skin eruption due to drugs and medicaments taken internally: Secondary | ICD-10-CM

## 2018-09-20 NOTE — Progress Notes (Signed)
Follow-up Note  RE: Olivia ScrapeKathrina Y Paul MRN: [id number] DOB: 12/28/1955 Date of Office Visit: 09/20/2018   History of present illness: Olivia Paul is a 63 y.o. female presenting today for follow-up of asthma and allergies.  She was last seen in the office on 02/02/18 by our NP Thermon LeylandAnne Ambs.   She had surgery on her leg and after surgery she was started on Meloxicam for pain control.  She developed "bruises" on her left leg.  She denies any itch or pain in the areas where the bruising is.  She was advised to take meloxicam every other day instead of daily as she reports bruising could be a known side effect. This was around May.  However she continued to have these a bruise looking lesions on her legs and thus it was decided to stop the meloxicam and she was switched to diclofenac.  She states she continued to have the bruising but she stopped taking the diclofenac as well.  However she did take diclofenac yesterday and today and she states she has a new bruise that has appeared on her leg in the same area where the older bruises were.  She denies any fevers or swelling associated with the bruising. She states that her asthma has been doing pretty well.  On average she will use her albuterol inhaler her nebulizer about 1-2 times a month.  She is using her air duo 1 puff daily but she states she will increase to 1 puff twice a day when she does have a flare.  She reports needing to increase her air duo during the winter when she did have a respiratory illness.  She continues to take singular daily. With her allergies she continues to use Allegra as needed.  She states that she recently went to her eye doctor and there was concern that she has elevated eye pressure.  She states her eye doctor reported there was concern that her air duo asthma medication and her Nasonex could be contributing to her eye pressure.  Review of systems: Review of Systems  Constitutional: Negative for chills, fever and  malaise/fatigue.  HENT: Negative for congestion, ear discharge, ear pain, nosebleeds and sore throat.   Eyes: Negative for pain, discharge and redness.  Respiratory: Negative for cough, shortness of breath and wheezing.   Cardiovascular: Negative for chest pain.  Gastrointestinal: Negative for abdominal pain, constipation, diarrhea, heartburn, nausea and vomiting.  Musculoskeletal: Negative for joint pain.  Skin: Positive for rash. Negative for itching.  Neurological: Negative for headaches.    All other systems negative unless noted above in HPI  Past medical/social/surgical/family history have been reviewed and are unchanged unless specifically indicated below.  No changes  Medication List: Allergies as of 09/20/2018      Reactions   Erythromycin    Sulfa Antibiotics       Medication List       Accurate as of September 20, 2018  4:58 PM. If you have any questions, ask your nurse or doctor.        STOP taking these medications   HYDROcodone-acetaminophen 10-325 MG tablet Commonly known as: NORCO Stopped by: Olivia Achorn Larose HiresPatricia Willam [place], Olivia Paul     TAKE these medications   albuterol (2.5 MG/3ML) 0.083% nebulizer solution Commonly known as: PROVENTIL Use 1 vial every 4-6 hours as needed for cough, wheeze, shortness of breath, or chest tightness   albuterol 108 (90 Base) MCG/ACT inhaler Commonly known as: VENTOLIN HFA Inhale 2 puffs into the lungs  every 4 (four) hours as needed for wheezing or shortness of breath.   diclofenac 75 MG EC tablet Commonly known as: VOLTAREN TAKE 1 TABLET BY MOUTH TWICE A DAY AS NEEDED WITH FOOD   diclofenac sodium 1 % Gel Commonly known as: VOLTAREN APPLY 2 4 GRAMS TO AFFECTED AREA UP TO 4 TIMES PER DAY   EPINEPHrine 0.3 mg/0.3 mL Soaj injection Commonly known as: EPI-PEN USE AS DIRECTED IN THE EVENT OF A SEVERE LIFE THREATNING ALLERGIC REACTION.   fexofenadine 180 MG tablet Commonly known as: ALLEGRA TAKE ONE TABLET ONCE DAILY FOR RUNNY NOSE  OR ITCHING   fluticasone 50 MCG/ACT nasal spray Commonly known as: FLONASE 2 sprays in each nostril once daily   Fluticasone-Salmeterol 232-14 MCG/ACT Aepb Inhale 2 puffs into the lungs 2 (two) times daily.   montelukast 10 MG tablet Commonly known as: SINGULAIR Take 1 tablet (10 mg total) by mouth at bedtime.   tolterodine 4 MG 24 hr capsule Commonly known as: DETROL LA Take 4 mg by mouth daily.   traMADol 50 MG tablet Commonly known as: ULTRAM TAKE 1 TABLET BY MOUTH EVERY 8 HRS AS NEEDED FOR PAIN       Known medication allergies: Allergies  Allergen Reactions  . Erythromycin   . Sulfa Antibiotics      Physical examination: Blood pressure 140/88, pulse 97, temperature (!) 97.3 F (36.3 C), temperature source Temporal, resp. rate 18, height 4' 11.2" (1.504 m), weight 197 lb (89.4 kg), SpO2 97 %.  General: Alert, interactive, in no acute distress. HEENT: PERRLA, TMs pearly gray, turbinates non-edematous without discharge, post-pharynx non erythematous. Neck: Supple without lymphadenopathy. Lungs: Clear to auscultation without wheezing, rhonchi or rales. {no increased work of breathing. CV: Normal S1, S2 without murmurs. Abdomen: Nondistended, nontender. Skin: Left leg with a faint ecchymotic plaque with a serpiginous border.  There is a oval ecchymotic patch on her left anterior shin with a central duskiness. Extremities:  No clubbing, cyanosis or edema. Neuro:   Grossly intact.  Diagnositics/Labs:  Spirometry: FEV1: 1.64L 108%, FVC: 1.76L 90%, ratio consistent with Nonobstructive pattern  Assessment and plan:   Asthma - Continue Airduo 1 puff daily.  Increase to 1 puff twice a day during asthma flares/illness - albuterol as needed 2 puffs every 4-6 hours for cough, wheeze, chest tightness, difficulty breathing - continue singulair 10mg  at bedtime daily Asthma control goals:   Full participation in all desired activities (may need albuterol before activity)   Albuterol use two time or less a week on average (not counting use with activity)  Cough interfering with sleep two time or less a month  Oral steroids no more than once a year  No hospitalizations  Allergic rhinitis - continue singulair as above, as needed Allegra - stop use of nasonex at this time due to concern for elevated eye pressure  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  The eruption developed after she started taking meloxicam.  She has stopped this medication however she has been taking diclofenac which is in the same category of NSAID as meloxicam.  She states she took a dose of diclofenac yesterday and today and she has a new bruised looking area on her leg in the same spot where the other skin lesions were.  I am concerned that the appearance of the new lesion as well as the appearance of the fading lesion is concerning for a fixed drug eruption.  Fixed drug eruptions  can occur with any medication however it usually will develop a bruise-like appearing skin eruption in the same spot with recurrent use of the medication.  To avoid the eruption it is best to avoid the medication. - skin improves over time and need to avoid medication causing the eruption - medications would avoid involve the group of NSAIDs in same category as Meloxicam which includes Diclofenac, Sulindac, Celecoxib, Etodolac  Follow-up in 4-6 months or sooner if needed  I appreciate the opportunity to take part in Demarie's care. Please do not hesitate to contact me with questions.  Sincerely,   Olivia Feeler, Olivia Paul [facility]

## 2018-09-20 NOTE — Patient Instructions (Addendum)
Asthma - Continue Airduo 1 puff daily.  Increase to 1 puff twice a day during asthma flares/illness - albuterol as needed 2 puffs every 4-6 hours for cough, wheeze, chest tightness, difficulty breathing - continue singulair 10mg  at bedtime daily Asthma control goals:   Full participation in all desired activities (may need albuterol before activity)  Albuterol use two time or less a week on average (not counting use with activity)  Cough interfering with sleep two time or less a month  Oral steroids no more than once a year  No hospitalizations  Allergies - continue singulair as above, as needed Allegra - stop use of nasonex at this time due to concern for elevated eye pressure  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

## 2018-09-21 ENCOUNTER — Telehealth: Payer: Self-pay | Admitting: *Deleted

## 2018-09-21 NOTE — Telephone Encounter (Signed)
-----   Message from [place], MD sent at 09/20/2018  5:07 PM EDT ----- Can someone fax this note to pt's orthopedist Dr. Dorna Leitz with [facility], fax# [phone]

## 2018-09-21 NOTE — Progress Notes (Signed)
Progress notes from 09/20/18 office visit faxed

## 2018-09-21 NOTE — Telephone Encounter (Signed)
Progress notes from 09/20/18 faxed.

## 2018-10-17 ENCOUNTER — Other Ambulatory Visit: Payer: Self-pay | Admitting: Allergy and Immunology

## 2018-10-17 DIAGNOSIS — H1010 Acute atopic conjunctivitis, unspecified eye: Secondary | ICD-10-CM

## 2018-10-17 DIAGNOSIS — J309 Allergic rhinitis, unspecified: Secondary | ICD-10-CM

## 2018-10-23 ENCOUNTER — Ambulatory Visit: Payer: 59 | Admitting: Internal Medicine

## 2018-10-29 ENCOUNTER — Encounter: Payer: Self-pay | Admitting: Orthopaedic Surgery

## 2018-10-29 ENCOUNTER — Ambulatory Visit (INDEPENDENT_AMBULATORY_CARE_PROVIDER_SITE_OTHER): Payer: 59 | Admitting: Orthopaedic Surgery

## 2018-10-29 VITALS — Ht 59.2 in | Wt 197.0 lb

## 2018-10-29 DIAGNOSIS — M1712 Unilateral primary osteoarthritis, left knee: Secondary | ICD-10-CM

## 2018-10-29 DIAGNOSIS — G8929 Other chronic pain: Secondary | ICD-10-CM

## 2018-10-29 DIAGNOSIS — M25562 Pain in left knee: Secondary | ICD-10-CM

## 2018-10-29 NOTE — Progress Notes (Signed)
Office Visit Note   Patient: Olivia Paul           Date of Birth: [birth year]           MRN: [id number] Visit Date: 10/29/2018              Requested by: Glendale Chard, [address] [place],  [place] [id number] PCP: Glendale Chard, MD   Assessment & Plan: Visit Diagnoses:  1. Chronic pain of left knee   2. Unilateral primary osteoarthritis, left knee     Plan: I do agree with my colleagues that the only option for her would be a knee replacement surgery and that her arthritis is significantly progressed rapidly over these last several months.  They are really not other good options for her and she is tried and failed other forms conservative treatment.  I showed her knee model again explained in detail what left knee replacement surgery involves including the risk and benefits of surgery and a thorough understanding of her interoperative and postoperative course.  Thorough discussion of risk and benefits was had as well.  I did give her our surgery scheduler's card.  Happy to do that surgery for her but I do feel that her primary orthopedic surgeon would do an excellent job as well.  She will let us know.  She needs to continue to work on activity modification, quad strengthening exercises and weight loss in the interim.  Follow-Up Instructions: Return if symptoms worsen or fail to improve.   Orders:  No orders of the defined types were placed in this encounter.  No orders of the defined types were placed in this encounter.     Procedures: No procedures performed   Clinical Data: No additional findings.   Subjective: Chief Complaint  Patient presents with  . Left Knee - Pain  The patient comes in today for second opinion as a relates to her left knee.  She underwent a left knee arthroscopy in January of this year for a left medial meniscal tear.  She actually has her arthroscopy pictures for me to review.  She unfortunately experienced significant worsening  of her left knee pain over the last several months to the point that she is tried and failed other forms conservative treatment including steroid injections, activity modification, attempts at weight loss, quad strengthening exercises and hyaluronic acid into the left knee.  Her primary treating orthopedic surgeon who performed the arthroscopic intervention then obtain new x-rays and somehow she developed significant joint space narrowing and arthritic changes on the medial compartment of her left knee.  I have her x-rays to review today as well.  She is actually scheduled to have a total knee arthroplasty done as an outpatient on her left knee at the end of this month.  She came to me for second opinion.  Also I have taken care of 1 of her neighbors.  Her knee pain is daily.  It is 10 out of 10.  It is at this point detrimentally affected her actives the living, her quality of life and her mobility.  The last steroid shot her knee causes significant swelling reaction where she cannot walk on that knee for several hours.  She also has asthma.  She sees a dermatologist for skin reactions that she has had on that left knee which may be also related to anti-inflammatories.  She came to me again for second opinion as it relates to her left knee.  She is  already been cleared for her left total knee arthroplasty and been approved for this from insurance standpoint through her insurance company with my colleague in town.  HPI  Review of Systems She currently denies any headache, chest pain, shortness of breath, fever, chills, nausea, vomiting  Objective: Vital Signs: Ht 4' 11.2" (1.504 m)   Wt 197 lb (89.4 kg)   BMI 39.52 kg/m   Physical Exam She is alert and orient x3 and in no acute distress Ortho Exam Examination of her left knee she has no significant effusion.  She has a large soft tissue envelope around both thighs and both knees.  Both knees hyperextend.  Her left knee does have changes on her skin  consistent with previous steroid injections.  The knee is ligamentously stable on the left side but there is significant medial joint line tenderness. Specialty Comments:  No specialty comments available.  Imaging: No results found. Independent review of x-rays of her left knee from before the arthroscopic intervention compared to most recently this last month show significant loss of joint space on the medial compartment of the left knee with varus malalignment.  The arthroscopy pictures show different stories within her knee in terms of she had actually really good cartilage in that knee at the time of her arthroscopic intervention.  PMFS History: Patient Active Problem List   Diagnosis Date Noted  . Unilateral primary osteoarthritis, left knee 10/29/2018  . Xanthelasma of right eyelid 03/26/2018  . Anxiety 03/26/2018  . Perennial allergic rhinitis 02/02/2018  . Moderate persistent asthma, uncomplicated 05/11/2017  . Drug allergy 05/11/2017  . Basal cell papilloma 03/20/2015  . Changing skin lesion 01/13/2015  . Asthma 11/05/2014  . Allergic rhinoconjunctivitis 11/05/2014   Past Medical History:  Diagnosis Date  . Asthma   . Recurrent upper respiratory infection (URI)     Family History  Problem Relation Age of Onset  . Allergic rhinitis Mother   . Allergic rhinitis Father   . Asthma Father   . Urticaria Sister   . Breast cancer Maternal Aunt   . Breast cancer Paternal Aunt   . Breast cancer Paternal Grandmother   . Angioedema Neg Hx   . Atopy Neg Hx   . Eczema Neg Hx   . Immunodeficiency Neg Hx     Past Surgical History:  Procedure Laterality Date  . BREAST BIOPSY    . BREAST EXCISIONAL BIOPSY    . KNEE ARTHROSCOPY WITH MENISCAL REPAIR Left 02/21/2018   Social History   Occupational History  . Not on file  Tobacco Use  . Smoking status: Never Smoker  . Smokeless tobacco: Never Used  Substance and Sexual Activity  . Alcohol use: No    Alcohol/week: 0.0  standard drinks  . Drug use: No  . Sexual activity: Not on file

## 2018-10-30 ENCOUNTER — Other Ambulatory Visit: Payer: Self-pay | Admitting: Orthopedic Surgery

## 2018-10-30 ENCOUNTER — Telehealth: Payer: Self-pay | Admitting: Internal Medicine

## 2018-10-30 NOTE — Telephone Encounter (Signed)
PT CALLED LVM TO CANCEL 10/1 APPT DUE TO PER [facility] THERE IS NO SURGICAL CLEARANCE THAT WILL BE NEEDED FROM PROVIDER FOR THE PROCEDURE . APPT CANCELLED

## 2018-11-07 NOTE — Patient Instructions (Addendum)
DUE TO COVID-19 ONLY ONE VISITOR IS ALLOWED TO COME WITH YOU AND STAY IN THE WAITING ROOM ONLY DURING PRE OP AND PROCEDURE DAY OF SURGERY.  THE 1 VISITOR MAY VISIT WITH YOU AFTER SURGERY IN YOUR PRIVATE ROOM DURING VISITING HOURS ONLY!  YOU NEED TO HAVE A COVID 19 TEST ON_Tues 9/29______ @_9 :30______, THIS TEST MUST BE DONE BEFORE SURGERY, COME  [address], [place] , [address].  [facility])  ONCE YOUR COVID TEST IS COMPLETED, PLEASE BEGIN THE QUARANTINE INSTRUCTIONS AS OUTLINED IN YOUR HANDOUT.                Olivia Paul   Your procedure is scheduled on: Friday 11/16/18   Report to [facility] Main  Entrance   Report to admitting at    9:30 AM     Call this number if you have problems the morning of surgery [phone]    BRUSH YOUR TEETH MORNING OF SURGERY AND RINSE YOUR MOUTH OUT, NO CHEWING GUM CANDY OR MINTS.   Do not eat food After Midnight.   YOU MAY HAVE CLEAR LIQUIDS FROM MIDNIGHT UNTIL 9:00 AM.  CLEAR LIQUID DIET   Foods Allowed                                                                     Foods Excluded  Coffee and tea, regular and decaf                             liquids that you cannot  Plain Jell-O any favor except red or purple                                           see through such as: Fruit ices (not with fruit pulp)                                     milk, soups, orange juice  Iced Popsicles                                    All solid food Carbonated beverages, regular and diet                                    Cranberry, grape and apple juices Sports drinks like Gatorade Lightly seasoned clear broth or consume(fat free) Sugar, honey syrup  At 9:00 AM Please finish the prescribed Pre-Surgery  drink. Nothing by mouth after you finish the  drink !     Take these medicines the morning of surgery with A SIP OF WATER: Flonase if needed, Detrol LA,   Use your inhalers and eye drops as usual, bring albuterol  inhaler with you to the hospital  You may not have any metal on your body including hair pins and              piercings              Do not wear jewelry, make-up, lotions, powders or perfumes, deodorant             Do not wear nail polish.  Do not shave  48 hours prior to surgery.           .   Do not bring valuables to the hospital. [facility] IS NOT             RESPONSIBLE   FOR VALUABLES.  Contacts, dentures or bridgework may not be worn into surgery.       Patients discharged the day of surgery will not be allowed to drive home . IF YOU ARE HAVING SURGERY AND GOING HOME THE SAME DAY, YOU MUST HAVE AN ADULT TO DRIVE YOU HOME AND BE WITH YOU FOR 24 HOURS. YOU MAY GO HOME BY TAXI OR UBER OR ORTHERWISE, BUT AN ADULT MUST ACCOMPANY YOU HOME AND STAY WITH YOU FOR 24 HOURS.  Name and phone number of your driver:  Special Instructions: N/A              Please read over the following fact sheets you were given: _____________________________________________________________________             [facility] - Preparing for Surgery  Before surgery, you can play an important role.   Because skin is not sterile, your skin needs to be as free of germs as possible .  You can reduce the number of germs on your skin by washing with CHG (chlorahexidine gluconate) soap before surgery.   CHG is an antiseptic cleaner which kills germs and bonds with the skin to continue killing germs even after washing. Please DO NOT use if you have an allergy to CHG or antibacterial soaps.   If your skin becomes reddened/irritated stop using the CHG and inform your nurse when you arrive at Short Stay. Do not shave (including legs and underarms) for at least 48 hours prior to the first CHG shower.   Please follow these instructions carefully:  1.  Shower with CHG Soap the night before surgery and the  morning of Surgery.  2.  If you choose to wash your hair, wash your hair first as  usual with your  normal  shampoo.  3.  After you shampoo, rinse your hair and body thoroughly to remove the  shampoo.                                        4.  Use CHG as you would any other liquid soap.  You can apply chg directly  to the skin and wash                       Gently with a scrungie or clean washcloth.  5.  Apply the CHG Soap to your body ONLY FROM THE NECK DOWN.   Do not use on face/ open                           Wound or open sores. Avoid contact with eyes, ears mouth and genitals (private parts).  Wash face,  Genitals (private parts) with your normal soap.             6.  Wash thoroughly, paying special attention to the area where your surgery  will be performed.  7.  Thoroughly rinse your body with warm water from the neck down.  8.  DO NOT shower/wash with your normal soap after using and rinsing off  the CHG Soap.             9.  Pat yourself dry with a clean towel.            10.  Wear clean pajamas.            11.  Place clean sheets on your bed the night of your first shower and do not  sleep with pets. Day of Surgery : Do not apply any lotions/deodorants the morning of surgery.  Please wear clean clothes to the hospital/surgery center.  FAILURE TO FOLLOW THESE INSTRUCTIONS MAY RESULT IN THE CANCELLATION OF YOUR SURGERY PATIENT SIGNATURE_________________________________  NURSE SIGNATURE__________________________________  ________________________________________________________________________   Adam Phenix  An incentive spirometer is a tool that can help keep your lungs clear and active. This tool measures how well you are filling your lungs with each breath. Taking long deep breaths may help reverse or decrease the chance of developing breathing (pulmonary) problems (especially infection) following:  A long period of time when you are unable to move or be active. BEFORE THE PROCEDURE   If the spirometer includes an indicator to  show your best effort, your nurse or respiratory therapist will set it to a desired goal.  If possible, sit up straight or lean slightly forward. Try not to slouch.  Hold the incentive spirometer in an upright position. INSTRUCTIONS FOR USE  1. Sit on the edge of your bed if possible, or sit up as far as you can in bed or on a chair. 2. Hold the incentive spirometer in an upright position. 3. Breathe out normally. 4. Place the mouthpiece in your mouth and seal your lips tightly around it. 5. Breathe in slowly and as deeply as possible, raising the piston or the ball toward the top of the column. 6. Hold your breath for 3-5 seconds or for as long as possible. Allow the piston or ball to fall to the bottom of the column. 7. Remove the mouthpiece from your mouth and breathe out normally. 8. Rest for a few seconds and repeat Steps 1 through 7 at least 10 times every 1-2 hours when you are awake. Take your time and take a few normal breaths between deep breaths. 9. The spirometer may include an indicator to show your best effort. Use the indicator as a goal to work toward during each repetition. 10. After each set of 10 deep breaths, practice coughing to be sure your lungs are clear. If you have an incision (the cut made at the time of surgery), support your incision when coughing by placing a pillow or rolled up towels firmly against it. Once you are able to get out of bed, walk around indoors and cough well. You may stop using the incentive spirometer when instructed by your caregiver.  RISKS AND COMPLICATIONS  Take your time so you do not get dizzy or light-headed.  If you are in pain, you may need to take or ask for pain medication before doing incentive spirometry. It is harder to take a deep breath if you are having pain. AFTER USE  Rest and breathe slowly and easily.  It can be helpful to keep track of a log of your progress. Your caregiver can provide you with a simple table to help with  this. If you are using the spirometer at home, follow these instructions: SEEK MEDICAL CARE IF:   You are having difficultly using the spirometer.  You have trouble using the spirometer as often as instructed.  Your pain medication is not giving enough relief while using the spirometer.  You develop fever of 100.5 F (38.1 C) or higher. SEEK IMMEDIATE MEDICAL CARE IF:   You cough up bloody sputum that had not been present before.  You develop fever of 102 F (38.9 C) or greater.  You develop worsening pain at or near the incision site. MAKE SURE YOU:   Understand these instructions.  Will watch your condition.  Will get help right away if you are not doing well or get worse. Document Released: 06/13/2006 Document Revised: 04/25/2011 Document Reviewed: 08/14/2006 ExitCare Patient Information 2014 ExitCare, [place].   ________________________________________________________________________  WHAT IS A BLOOD TRANSFUSION? Blood Transfusion Information  A transfusion is the replacement of blood or some of its parts. Blood is made up of multiple cells which provide different functions.  Red blood cells carry oxygen and are used for blood loss replacement.  White blood cells fight against infection.  Platelets control bleeding.  Plasma helps clot blood.  Other blood products are available for specialized needs, such as hemophilia or other clotting disorders. BEFORE THE TRANSFUSION  Who gives blood for transfusions?   Healthy volunteers who are fully evaluated to make sure their blood is safe. This is blood bank blood. Transfusion therapy is the safest it has ever been in the practice of medicine. Before blood is taken from a donor, a complete history is taken to make sure that person has no history of diseases nor engages in risky social behavior (examples are intravenous drug use or sexual activity with multiple partners). The donor's travel history is screened to minimize  risk of transmitting infections, such as malaria. The donated blood is tested for signs of infectious diseases, such as HIV and hepatitis. The blood is then tested to be sure it is compatible with you in order to minimize the chance of a transfusion reaction. If you or a relative donates blood, this is often done in anticipation of surgery and is not appropriate for emergency situations. It takes many days to process the donated blood. RISKS AND COMPLICATIONS Although transfusion therapy is very safe and saves many lives, the main dangers of transfusion include:   Getting an infectious disease.  Developing a transfusion reaction. This is an allergic reaction to something in the blood you were given. Every precaution is taken to prevent this. The decision to have a blood transfusion has been considered carefully by your caregiver before blood is given. Blood is not given unless the benefits outweigh the risks. AFTER THE TRANSFUSION  Right after receiving a blood transfusion, you will usually feel much better and more energetic. This is especially true if your red blood cells have gotten low (anemic). The transfusion raises the level of the red blood cells which carry oxygen, and this usually causes an energy increase.  The nurse administering the transfusion will monitor you carefully for complications. HOME CARE INSTRUCTIONS  No special instructions are needed after a transfusion. You may find your energy is better. Speak with your caregiver about any limitations on activity for underlying diseases you may have.  SEEK MEDICAL CARE IF:   Your condition is not improving after your transfusion.  You develop redness or irritation at the intravenous (IV) site. SEEK IMMEDIATE MEDICAL CARE IF:  Any of the following symptoms occur over the next 12 hours:  Shaking chills.  You have a temperature by mouth above 102 F (38.9 C), not controlled by medicine.  Chest, back, or muscle pain.  People  around you feel you are not acting correctly or are confused.  Shortness of breath or difficulty breathing.  Dizziness and fainting.  You get a rash or develop hives.  You have a decrease in urine output.  Your urine turns a dark color or changes to pink, red, or brown. Any of the following symptoms occur over the next 10 days:  You have a temperature by mouth above 102 F (38.9 C), not controlled by medicine.  Shortness of breath.  Weakness after normal activity.  The white part of the eye turns yellow (jaundice).  You have a decrease in the amount of urine or are urinating less often.  Your urine turns a dark color or changes to pink, red, or brown. Document Released: 01/29/2000 Document Revised: 04/25/2011 Document Reviewed: 09/17/2007 [facility] Patient Information 2014 [place], [place].  _______________________________________________________________________

## 2018-11-08 ENCOUNTER — Encounter (HOSPITAL_COMMUNITY): Payer: Self-pay

## 2018-11-08 ENCOUNTER — Encounter (HOSPITAL_COMMUNITY)
Admission: RE | Admit: 2018-11-08 | Discharge: 2018-11-08 | Disposition: A | Discharge status: Home / Self Care | Payer: 59 | Source: Ambulatory Visit | Attending: Orthopedic Surgery | Admitting: Orthopedic Surgery

## 2018-11-08 ENCOUNTER — Other Ambulatory Visit: Payer: Self-pay

## 2018-11-08 DIAGNOSIS — Z79899 Other long term (current) drug therapy: Secondary | ICD-10-CM | POA: Diagnosis not present

## 2018-11-08 DIAGNOSIS — Z881 Allergy status to other antibiotic agents status: Secondary | ICD-10-CM | POA: Insufficient documentation

## 2018-11-08 DIAGNOSIS — Z882 Allergy status to sulfonamides status: Secondary | ICD-10-CM | POA: Diagnosis not present

## 2018-11-08 DIAGNOSIS — Z888 Allergy status to other drugs, medicaments and biological substances status: Secondary | ICD-10-CM | POA: Diagnosis not present

## 2018-11-08 DIAGNOSIS — Z01812 Encounter for preprocedural laboratory examination: Secondary | ICD-10-CM | POA: Insufficient documentation

## 2018-11-08 DIAGNOSIS — M25562 Pain in left knee: Secondary | ICD-10-CM | POA: Diagnosis not present

## 2018-11-08 DIAGNOSIS — G8929 Other chronic pain: Secondary | ICD-10-CM | POA: Diagnosis not present

## 2018-11-08 DIAGNOSIS — Z886 Allergy status to analgesic agent status: Secondary | ICD-10-CM | POA: Diagnosis not present

## 2018-11-08 HISTORY — DX: Headache, unspecified: R51.9

## 2018-11-08 HISTORY — DX: Other complications of anesthesia, initial encounter: T88.59XA

## 2018-11-08 HISTORY — DX: Prediabetes: R73.03

## 2018-11-08 HISTORY — DX: Unspecified osteoarthritis, unspecified site: M19.90

## 2018-11-08 HISTORY — DX: Nausea with vomiting, unspecified: Z98.890

## 2018-11-08 HISTORY — DX: Nausea with vomiting, unspecified: R11.2

## 2018-11-08 HISTORY — DX: Other specified postprocedural states: Z98.890

## 2018-11-08 LAB — CBC WITH DIFFERENTIAL/PLATELET
Abs Immature Granulocytes: 0.03 10*3/uL (ref 0.00–0.07)
Basophils Absolute: 0 10*3/uL (ref 0.0–0.1)
Basophils Relative: 0 %
Eosinophils Absolute: 0.2 10*3/uL (ref 0.0–0.5)
Eosinophils Relative: 2 %
HCT: 42.4 % (ref 36.0–46.0)
Hemoglobin: 13.1 g/dL (ref 12.0–15.0)
Immature Granulocytes: 0 %
Lymphocytes Relative: 23 %
Lymphs Abs: 2.2 10*3/uL (ref 0.7–4.0)
MCH: 28.4 pg (ref 26.0–34.0)
MCHC: 30.9 g/dL (ref 30.0–36.0)
MCV: 92 fL (ref 80.0–100.0)
Monocytes Absolute: 0.6 10*3/uL (ref 0.1–1.0)
Monocytes Relative: 7 %
Neutro Abs: 6.7 10*3/uL (ref 1.7–7.7)
Neutrophils Relative %: 68 %
Platelets: 373 10*3/uL (ref 150–400)
RBC: 4.61 MIL/uL (ref 3.87–5.11)
RDW: 14.9 % (ref 11.5–15.5)
WBC: 9.7 10*3/uL (ref 4.0–10.5)
nRBC: 0 % (ref 0.0–0.2)

## 2018-11-08 LAB — BASIC METABOLIC PANEL
Anion gap: 6 (ref 5–15)
BUN: 13 mg/dL (ref 8–23)
CO2: 28 mmol/L (ref 22–32)
Calcium: 9 mg/dL (ref 8.9–10.3)
Chloride: 105 mmol/L (ref 98–111)
Creatinine, Ser: 0.74 mg/dL (ref 0.44–1.00)
GFR calc Af Amer: >60 mL/min (ref >60)
GFR calc non Af Amer: >60 mL/min (ref >60)
Glucose, Bld: 102 mg/dL — ABNORMAL HIGH (ref 70–99)
Potassium: 4.2 mmol/L (ref 3.5–5.1)
Sodium: 139 mmol/L (ref 135–145)

## 2018-11-08 LAB — HEMOGLOBIN A1C
Hgb A1c MFr Bld: 5.5 % (ref 4.8–5.6)
Mean Plasma Glucose: 111.15 mg/dL

## 2018-11-08 LAB — SURGICAL PCR SCREEN
MRSA, PCR: NEGATIVE
Staphylococcus aureus: NEGATIVE

## 2018-11-08 NOTE — Progress Notes (Addendum)
PCP - Dr. Baird Cancer Cardiologist - none  Chest x-ray - no EKG - no Stress Test - no ECHO - no Cardiac Cath - no  Sleep Study - no CPAP -   Fasting Blood Sugar - pt doesn't know Checks Blood Sugar __0___ times a day  Blood Thinner Instructions:NA Aspirin Instructions: Last Dose:  Anesthesia review:   Patient denies shortness of breath, fever, cough and chest pain at PAT appointment Yes  Patient verbalized understanding of instructions that were given to them at the PAT appointment. Patient was also instructed that they will need to review over the PAT instructions again at home before surgery. Yes  Surgeon's orders were under second sign and were not accessible at PAT visit. Labs and consent will need to be done on DOS

## 2018-11-12 ENCOUNTER — Telehealth: Payer: Self-pay | Admitting: Allergy

## 2018-11-12 ENCOUNTER — Ambulatory Visit (INDEPENDENT_AMBULATORY_CARE_PROVIDER_SITE_OTHER): Payer: 59 | Admitting: Nurse Practitioner

## 2018-11-12 ENCOUNTER — Encounter: Payer: Self-pay | Admitting: Nurse Practitioner

## 2018-11-12 ENCOUNTER — Other Ambulatory Visit: Payer: Self-pay

## 2018-11-12 VITALS — BP 120/76 | HR 94 | Temp 98.7°F | Ht 58.6 in | Wt 192.6 lb

## 2018-11-12 DIAGNOSIS — G8929 Other chronic pain: Secondary | ICD-10-CM

## 2018-11-12 DIAGNOSIS — Z01818 Encounter for other preprocedural examination: Secondary | ICD-10-CM | POA: Diagnosis not present

## 2018-11-12 DIAGNOSIS — M25562 Pain in left knee: Secondary | ICD-10-CM

## 2018-11-12 DIAGNOSIS — Z23 Encounter for immunization: Secondary | ICD-10-CM

## 2018-11-12 DIAGNOSIS — J4530 Mild persistent asthma, uncomplicated: Secondary | ICD-10-CM

## 2018-11-12 MED ORDER — TETANUS-DIPHTH-ACELL PERTUSSIS 5-2.5-18.5 LF-MCG/0.5 IM SUSP
0.5000 mL | Freq: Once | INTRAMUSCULAR | Status: AC
  Administered 2018-11-12: 0.5 mL via INTRAMUSCULAR

## 2018-11-12 NOTE — Progress Notes (Addendum)
Subjective:     Patient ID: Olivia Paul , female    DOB: December 27, 1955 , 63 y.o.   MRN: [id number]   Chief Complaint  Patient presents with  . Surgical clearance    patient is having a total knee repplacement on friday with Dr.Graves     HPI  She is here today for knee replacement left knee she had a torn meniscus.  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    She is concerned about her reaction to thedifferent materials.      Past Medical History:  Diagnosis Date  . Arthritis    lt knee  . Asthma 2011  . Complication of anesthesia   . Headache    sinus  . PONV (postoperative nausea and vomiting)   . Pre-diabetes   . Recurrent upper respiratory infection (URI)      Family History  Problem Relation Age of Onset  . Allergic rhinitis Mother   . Allergic rhinitis Father   . Asthma Father   . Urticaria Sister   . Breast cancer Maternal Aunt   . Breast cancer Paternal Aunt   . Breast cancer Paternal Grandmother   . Angioedema Neg Hx   . Atopy Neg Hx   . Eczema Neg Hx   . Immunodeficiency Neg Hx      Current Outpatient Medications:  .  albuterol (PROVENTIL HFA;VENTOLIN HFA) 108 (90 Base) MCG/ACT inhaler, Inhale 2 puffs into the lungs every 4 (four) hours as needed for wheezing or shortness of breath., Disp: 18 g, Rfl: 1 .  albuterol (PROVENTIL) (2.5 MG/3ML) 0.083% nebulizer solution, Use 1 vial every 4-6 hours as needed for cough, wheeze, shortness of breath, or chest tightness, Disp: 180 mL, Rfl: 5 .  Cholecalciferol (VITAMIN D) 50 MCG (2000 UT) CAPS, Take 2,000 Units by mouth daily., Disp: , Rfl:  .  EPINEPHrine 0.3 mg/0.3 mL IJ SOAJ injection, Inject 0.3 mg into the muscle as needed for anaphylaxis. , Disp: , Rfl:  .  fexofenadine (ALLEGRA) 180 MG tablet, TAKE ONE TABLET ONCE DAILY FOR RUNNY NOSE OR ITCHING (Patient taking differently: Take 180 mg by mouth daily. ), Disp: 90 tablet, Rfl: 3 .  Fluticasone-Salmeterol 232-14 MCG/ACT AEPB, Inhale 2  puffs into the lungs 2 (two) times daily. (Patient taking differently: Inhale 2 puffs into the lungs daily. ), Disp: 1 each, Rfl: 0 .  hydroxypropyl methylcellulose / hypromellose (ISOPTO TEARS / GONIOVISC) 2.5 % ophthalmic solution, Place 1 drop into both eyes 3 (three) times daily as needed for dry eyes., Disp: , Rfl:  .  Krill Oil (OMEGA-3) 500 MG CAPS, Take 500 mg by mouth daily., Disp: , Rfl:  .  magnesium oxide (MAG-OX) 400 MG tablet, Take 400 mg by mouth daily., Disp: , Rfl:  .  Menthol-Methyl Salicylate (TIGER BALM LINIMENT EX), Apply 1 application topically at bedtime as needed (pain)., Disp: , Rfl:  .  montelukast (SINGULAIR) 10 MG tablet, TAKE 1 TABLET BY MOUTH EVERYDAY AT BEDTIME (Patient taking differently: Take 10 mg by mouth at bedtime. ), Disp: 30 tablet, Rfl: 5 .  tolterodine (DETROL LA) 4 MG 24 hr capsule, Take 4 mg by mouth daily., Disp: , Rfl: 2 .  traMADol (ULTRAM) 50 MG tablet, Take 50 mg by mouth every 8 (eight) hours as needed for moderate pain. , Disp: , Rfl:  .  vitamin E 400 UNIT capsule, Take 400 Units by mouth daily., Disp: , Rfl:    Allergies  Allergen Reactions  . Erythromycin Nausea And Vomiting  . Meloxicam     Bruised her legs all over  "Fixed drug eruption"  . Nsaids     Scarring and bruising of legs  . Sulfa Antibiotics     Mouth broke out in sores     Review of Systems  Constitutional: Negative.   Respiratory: Negative.   Cardiovascular: Negative.  Negative for chest pain, palpitations and leg swelling.  Musculoskeletal:       Left knee pain, she had swelling and discoloration to her knee after having injections.   Neurological: Negative for dizziness and headaches.  Psychiatric/Behavioral: Negative.      Today's Vitals   11/12/18 1547  BP: 120/76  Pulse: 94  Temp: 98.7 F (37.1 C)  TempSrc: Oral  Weight: 192 lb 9.6 oz (87.4 kg)  Height: 4' 10.6" (1.488 m)  PainSc: 0-No pain   Body mass index is 39.43 kg/m.   Objective:  Physical  Exam Constitutional:      Appearance: Normal appearance.  Cardiovascular:     Rate and Rhythm: Normal rate and regular rhythm.     Pulses: Normal pulses.     Heart sounds: Normal heart sounds. No murmur.  Pulmonary:     Effort: Pulmonary effort is normal. No respiratory distress.     Breath sounds: Wheezing (mild wheezes upper lobes) present.  Musculoskeletal:        General: Tenderness present.     Right lower leg: No edema.     Left lower leg: No edema.  Skin:    General: Skin is warm and dry.     Capillary Refill: Capillary refill takes less than 2 seconds.     Comments: Slight hypopigmented areas to left thigh and knee  Neurological:     General: No focal deficit present.     Mental Status: She is alert and oriented to person, place, and time.         Assessment And Plan:     1. Chronic pain of left knee  She is scheduled for surgery of her left knee on Friday, EKG revealed NSR  She is cleared medical and cardiac for her upcoming surgery  She is advised to discuss with the surgeon the type of material will be used for her knee replacement. She is also to discuss with her allergist - EKG 12-Lead  2. Encounter for immunization  tetanus vaccine was given today while in office. Refer to order management. TDAP will be administered to adults 55-40 years old every 10 years. - Tdap (BOOSTRIX) injection 0.5 mL  3. Mild persistent asthma without complication  Mild wheezes bilateral upper lobes likely precipitated by her anxiety about her surgery  [name], FNP    THE PATIENT IS ENCOURAGED TO PRACTICE SOCIAL DISTANCING DUE TO THE COVID-19 PANDEMIC.

## 2018-11-12 NOTE — Telephone Encounter (Signed)
Dr. Padgett? 

## 2018-11-12 NOTE — Telephone Encounter (Signed)
Patient is having knee surgery on Friday, 11-16-18. She said she was watching some videos on this and is concerned with some information she heard about how she could have an allergic reaction. She says she is having anxiety over this and would like to talk to Dr. Nelva Bush or a nurse to see if it is possible that she might have a reaction. I explained to her, Dr. Nelva Bush would not be in until Wednesday.

## 2018-11-13 ENCOUNTER — Other Ambulatory Visit (HOSPITAL_COMMUNITY)
Admission: RE | Admit: 2018-11-13 | Discharge: 2018-11-13 | Disposition: A | Discharge status: Home / Self Care | Payer: 59 | Source: Ambulatory Visit | Attending: Orthopedic Surgery | Admitting: Orthopedic Surgery

## 2018-11-13 DIAGNOSIS — Z20828 Contact with and (suspected) exposure to other viral communicable diseases: Secondary | ICD-10-CM | POA: Insufficient documentation

## 2018-11-13 DIAGNOSIS — Z01812 Encounter for preprocedural laboratory examination: Secondary | ICD-10-CM | POA: Diagnosis not present

## 2018-11-13 NOTE — Telephone Encounter (Signed)
Ok thanks for gathering that info.    Yes agree if there is any concern for possible metal allergy then she would need to postpone surgery in order to have metal testing done.  As you relayed to pt will need to wait on pt to notify us if she is wanting metal testing prior to surgery or not.

## 2018-11-13 NOTE — Telephone Encounter (Signed)
Patient was nervous because she is having surgery soon and had went in to preop watched a video on the surgery and notice that some reactions can occur with metals placed in the knee. Patient spoke with surgeon and they advise her she will be having some sort of Titanium metal placed in the knee. She was concern of having reactions cause she had reaction to Nickel in the past. Patient is waiting on surgeon to call back with further answers regarding if this metal will contain any other substance of metal and will contact her soon. Advise patient we do Patch testing for metals but are done on Mon, Wed, and Friday. With it being close to surgery date we cannot do testing and will have to see if she wants to proceed with her schedule date if the surgeon deems necessary. If they can post pone surgery until we could do patch testing then patient will call back to notify us.

## 2018-11-13 NOTE — Telephone Encounter (Signed)
Can you gather what she is specifically concerned about regarding the surgery?  Most people do just fine with surgery including with the topical antiseptic used, any anesthesia as well as with antibiotics that may typically be given.  I would not recommend she take any of the pain medications as discussed at last visit due to concern for fixed drug eruption.   Otherwise medications she is allergic too can be easily avoided during surgery and post-op.   She can talk with her surgeon and anesthesia to see if she can take any antihistamines prior to surgery as this can help reduce risk of reactions.

## 2018-11-14 LAB — NOVEL CORONAVIRUS, NAA (HOSP ORDER, SEND-OUT TO REF LAB; TAT 18-24 HRS): SARS-CoV-2, NAA: NOT DETECTED

## 2018-11-15 ENCOUNTER — Ambulatory Visit: Payer: 59 | Admitting: Internal Medicine

## 2018-11-15 MED ORDER — BUPIVACAINE LIPOSOME 1.3 % IJ SUSP
20.0000 mL | Freq: Once | INTRAMUSCULAR | Status: DC
  Filled 2018-11-15: qty 20

## 2018-11-16 ENCOUNTER — Encounter (HOSPITAL_COMMUNITY): Payer: Self-pay

## 2018-11-16 ENCOUNTER — Ambulatory Visit (HOSPITAL_COMMUNITY): Payer: 59

## 2018-11-16 ENCOUNTER — Ambulatory Visit (HOSPITAL_COMMUNITY)
Admission: RE | Admit: 2018-11-16 | Discharge: 2018-11-17 | Disposition: A | Discharge status: Home Health | Payer: 59 | Source: Other Acute Inpatient Hospital | Attending: Orthopedic Surgery | Admitting: Orthopedic Surgery

## 2018-11-16 ENCOUNTER — Ambulatory Visit (HOSPITAL_COMMUNITY): Payer: 59 | Admitting: Certified Registered"

## 2018-11-16 ENCOUNTER — Ambulatory Visit (HOSPITAL_COMMUNITY): Payer: 59 | Admitting: Physician Assistant

## 2018-11-16 ENCOUNTER — Other Ambulatory Visit: Payer: Self-pay

## 2018-11-16 ENCOUNTER — Encounter (HOSPITAL_COMMUNITY)
Admission: RE | Disposition: A | Discharge status: Home Health | Payer: Self-pay | Source: Other Acute Inpatient Hospital | Attending: Orthopedic Surgery

## 2018-11-16 DIAGNOSIS — Z886 Allergy status to analgesic agent status: Secondary | ICD-10-CM | POA: Insufficient documentation

## 2018-11-16 DIAGNOSIS — J4540 Moderate persistent asthma, uncomplicated: Secondary | ICD-10-CM | POA: Diagnosis not present

## 2018-11-16 DIAGNOSIS — Z881 Allergy status to other antibiotic agents status: Secondary | ICD-10-CM | POA: Diagnosis not present

## 2018-11-16 DIAGNOSIS — Z882 Allergy status to sulfonamides status: Secondary | ICD-10-CM | POA: Diagnosis not present

## 2018-11-16 DIAGNOSIS — Z6841 Body Mass Index (BMI) 40.0 and over, adult: Secondary | ICD-10-CM | POA: Insufficient documentation

## 2018-11-16 DIAGNOSIS — M1712 Unilateral primary osteoarthritis, left knee: Secondary | ICD-10-CM | POA: Diagnosis not present

## 2018-11-16 DIAGNOSIS — Z79899 Other long term (current) drug therapy: Secondary | ICD-10-CM | POA: Diagnosis not present

## 2018-11-16 DIAGNOSIS — Z7951 Long term (current) use of inhaled steroids: Secondary | ICD-10-CM | POA: Insufficient documentation

## 2018-11-16 DIAGNOSIS — Z01811 Encounter for preprocedural respiratory examination: Secondary | ICD-10-CM

## 2018-11-16 HISTORY — PX: TOTAL KNEE ARTHROPLASTY: SHX125

## 2018-11-16 LAB — URINALYSIS, ROUTINE W REFLEX MICROSCOPIC
Bilirubin Urine: NEGATIVE
Glucose, UA: NEGATIVE mg/dL
Hgb urine dipstick: NEGATIVE
Ketones, ur: NEGATIVE mg/dL
Leukocytes,Ua: NEGATIVE
Nitrite: NEGATIVE
Protein, ur: NEGATIVE mg/dL
Specific Gravity, Urine: 1.003 — ABNORMAL LOW (ref 1.005–1.030)
pH: 7 (ref 5.0–8.0)

## 2018-11-16 LAB — CBC WITH DIFFERENTIAL/PLATELET
Abs Immature Granulocytes: 0.04 10*3/uL (ref 0.00–0.07)
Basophils Absolute: 0 10*3/uL (ref 0.0–0.1)
Basophils Relative: 0 %
Eosinophils Absolute: 0.1 10*3/uL (ref 0.0–0.5)
Eosinophils Relative: 1 %
HCT: 41 % (ref 36.0–46.0)
Hemoglobin: 12.8 g/dL (ref 12.0–15.0)
Immature Granulocytes: 0 %
Lymphocytes Relative: 16 %
Lymphs Abs: 1.5 10*3/uL (ref 0.7–4.0)
MCH: 28.5 pg (ref 26.0–34.0)
MCHC: 31.2 g/dL (ref 30.0–36.0)
MCV: 91.3 fL (ref 80.0–100.0)
Monocytes Absolute: 0.6 10*3/uL (ref 0.1–1.0)
Monocytes Relative: 7 %
Neutro Abs: 6.8 10*3/uL (ref 1.7–7.7)
Neutrophils Relative %: 76 %
Platelets: 340 10*3/uL (ref 150–400)
RBC: 4.49 MIL/uL (ref 3.87–5.11)
RDW: 14.6 % (ref 11.5–15.5)
WBC: 9 10*3/uL (ref 4.0–10.5)
nRBC: 0 % (ref 0.0–0.2)

## 2018-11-16 LAB — COMPREHENSIVE METABOLIC PANEL
ALT: 13 U/L (ref 0–44)
AST: 14 U/L — ABNORMAL LOW (ref 15–41)
Albumin: 3.9 g/dL (ref 3.5–5.0)
Alkaline Phosphatase: 90 U/L (ref 38–126)
Anion gap: 8 (ref 5–15)
BUN: 9 mg/dL (ref 8–23)
CO2: 25 mmol/L (ref 22–32)
Calcium: 9 mg/dL (ref 8.9–10.3)
Chloride: 108 mmol/L (ref 98–111)
Creatinine, Ser: 0.74 mg/dL (ref 0.44–1.00)
GFR calc Af Amer: >60 mL/min (ref >60)
GFR calc non Af Amer: >60 mL/min (ref >60)
Glucose, Bld: 99 mg/dL (ref 70–99)
Potassium: 3.4 mmol/L — ABNORMAL LOW (ref 3.5–5.1)
Sodium: 141 mmol/L (ref 135–145)
Total Bilirubin: 0.3 mg/dL (ref 0.3–1.2)
Total Protein: 7.6 g/dL (ref 6.5–8.1)

## 2018-11-16 LAB — PROTIME-INR
INR: 1 (ref 0.8–1.2)
Prothrombin Time: 12.9 seconds (ref 11.4–15.2)

## 2018-11-16 LAB — APTT: aPTT: 35 seconds (ref 24–36)

## 2018-11-16 LAB — TYPE AND SCREEN
ABO/RH(D): A POS
Antibody Screen: NEGATIVE

## 2018-11-16 LAB — ABO/RH: ABO/RH(D): A POS

## 2018-11-16 SURGERY — ARTHROPLASTY, KNEE, TOTAL
Anesthesia: Spinal | Site: Knee | Laterality: Left

## 2018-11-16 MED ORDER — PROPOFOL 10 MG/ML IV BOLUS
INTRAVENOUS | Status: DC | PRN
  Administered 2018-11-16: 50 mg via INTRAVENOUS

## 2018-11-16 MED ORDER — CEFAZOLIN SODIUM-DEXTROSE 2-4 GM/100ML-% IV SOLN
2.0000 g | INTRAVENOUS | Status: AC
  Administered 2018-11-16: 2 g via INTRAVENOUS
  Filled 2018-11-16: qty 100

## 2018-11-16 MED ORDER — MAGNESIUM CITRATE PO SOLN: 1.0000 | Freq: Once | ORAL | Status: DC | PRN

## 2018-11-16 MED ORDER — ALBUTEROL SULFATE (2.5 MG/3ML) 0.083% IN NEBU: 2.5000 mg | INHALATION_SOLUTION | RESPIRATORY_TRACT | Status: DC | PRN

## 2018-11-16 MED ORDER — ASPIRIN EC 325 MG PO TBEC
325.0000 mg | DELAYED_RELEASE_TABLET | Freq: Two times a day (BID) | ORAL | Status: DC
  Administered 2018-11-17: 325 mg via ORAL
  Filled 2018-11-16: qty 1

## 2018-11-16 MED ORDER — DIPHENHYDRAMINE HCL 12.5 MG/5ML PO ELIX: 12.5000 mg | ORAL_SOLUTION | ORAL | Status: DC | PRN

## 2018-11-16 MED ORDER — BUPIVACAINE-EPINEPHRINE 0.5% -1:200000 IJ SOLN
INTRAMUSCULAR | Status: DC | PRN
  Administered 2018-11-16: 30 mL

## 2018-11-16 MED ORDER — MONTELUKAST SODIUM 10 MG PO TABS
10.0000 mg | ORAL_TABLET | Freq: Every day | ORAL | Status: DC
  Administered 2018-11-16: 10 mg via ORAL
  Filled 2018-11-16: qty 1

## 2018-11-16 MED ORDER — FENTANYL CITRATE (PF) 100 MCG/2ML IJ SOLN
50.0000 ug | INTRAMUSCULAR | Status: DC
  Administered 2018-11-16: 100 ug via INTRAVENOUS
  Filled 2018-11-16: qty 2

## 2018-11-16 MED ORDER — SODIUM CHLORIDE (PF) 0.9 % IJ SOLN
INTRAMUSCULAR | Status: AC
  Filled 2018-11-16: qty 50

## 2018-11-16 MED ORDER — TRANEXAMIC ACID-NACL 1000-0.7 MG/100ML-% IV SOLN
1000.0000 mg | Freq: Once | INTRAVENOUS | Status: AC
  Administered 2018-11-16: 1000 mg via INTRAVENOUS
  Filled 2018-11-16: qty 100

## 2018-11-16 MED ORDER — BISACODYL 5 MG PO TBEC: 5.0000 mg | DELAYED_RELEASE_TABLET | Freq: Every day | ORAL | Status: DC | PRN

## 2018-11-16 MED ORDER — BUPIVACAINE-EPINEPHRINE 0.5% -1:200000 IJ SOLN
INTRAMUSCULAR | Status: AC
  Filled 2018-11-16: qty 1

## 2018-11-16 MED ORDER — PROPOFOL 500 MG/50ML IV EMUL
INTRAVENOUS | Status: DC | PRN
  Administered 2018-11-16: 135 ug/kg/min via INTRAVENOUS
  Administered 2018-11-16: 125 ug/kg/min via INTRAVENOUS

## 2018-11-16 MED ORDER — ROPIVACAINE HCL 5 MG/ML IJ SOLN
INTRAMUSCULAR | Status: DC | PRN
  Administered 2018-11-16: 30 mL via PERINEURAL

## 2018-11-16 MED ORDER — SODIUM CHLORIDE 0.9 % IV SOLN
INTRAVENOUS | Status: DC | PRN
  Administered 2018-11-16: 25 ug/min via INTRAVENOUS

## 2018-11-16 MED ORDER — METHOCARBAMOL 500 MG IVPB - SIMPLE MED
INTRAVENOUS | Status: AC
  Filled 2018-11-16: qty 50

## 2018-11-16 MED ORDER — MOMETASONE FURO-FORMOTEROL FUM 200-5 MCG/ACT IN AERO
2.0000 | INHALATION_SPRAY | Freq: Two times a day (BID) | RESPIRATORY_TRACT | Status: DC
  Administered 2018-11-16 – 2018-11-17 (×2): 2 via RESPIRATORY_TRACT
  Filled 2018-11-16: qty 8.8

## 2018-11-16 MED ORDER — PHENYLEPHRINE HCL (PRESSORS) 10 MG/ML IV SOLN
INTRAVENOUS | Status: AC
  Filled 2018-11-16: qty 1

## 2018-11-16 MED ORDER — PROPOFOL 500 MG/50ML IV EMUL
INTRAVENOUS | Status: AC
  Filled 2018-11-16: qty 50

## 2018-11-16 MED ORDER — DOCUSATE SODIUM 100 MG PO CAPS
100.0000 mg | ORAL_CAPSULE | Freq: Two times a day (BID) | ORAL | Status: DC
  Administered 2018-11-16 – 2018-11-17 (×2): 100 mg via ORAL
  Filled 2018-11-16 (×2): qty 1

## 2018-11-16 MED ORDER — ONDANSETRON HCL 4 MG/2ML IJ SOLN
INTRAMUSCULAR | Status: DC | PRN
  Administered 2018-11-16: 4 mg via INTRAVENOUS

## 2018-11-16 MED ORDER — SODIUM CHLORIDE 0.9% FLUSH
INTRAVENOUS | Status: DC | PRN
  Administered 2018-11-16: 50 mL

## 2018-11-16 MED ORDER — 0.9 % SODIUM CHLORIDE (POUR BTL) OPTIME
TOPICAL | Status: DC | PRN
  Administered 2018-11-16: 1000 mL

## 2018-11-16 MED ORDER — FENTANYL CITRATE (PF) 100 MCG/2ML IJ SOLN
25.0000 ug | INTRAMUSCULAR | Status: DC | PRN
  Administered 2018-11-16 (×2): 50 ug via INTRAVENOUS

## 2018-11-16 MED ORDER — LACTATED RINGERS IV SOLN
INTRAVENOUS | Status: DC
  Administered 2018-11-16 (×2): via INTRAVENOUS

## 2018-11-16 MED ORDER — OXYCODONE HCL 5 MG PO TABS
5.0000 mg | ORAL_TABLET | ORAL | Status: DC | PRN
  Administered 2018-11-16 – 2018-11-17 (×6): 10 mg via ORAL
  Filled 2018-11-16 (×6): qty 2

## 2018-11-16 MED ORDER — METHOCARBAMOL 500 MG IVPB - SIMPLE MED
500.0000 mg | Freq: Four times a day (QID) | INTRAVENOUS | Status: DC | PRN
  Administered 2018-11-16: 500 mg via INTRAVENOUS
  Filled 2018-11-16: qty 50

## 2018-11-16 MED ORDER — CHLORHEXIDINE GLUCONATE 4 % EX LIQD: 60.0000 mL | Freq: Once | CUTANEOUS | Status: DC

## 2018-11-16 MED ORDER — HYDROMORPHONE HCL 1 MG/ML IJ SOLN
0.5000 mg | INTRAMUSCULAR | Status: DC | PRN
  Administered 2018-11-16: 1 mg via INTRAVENOUS
  Administered 2018-11-16 (×2): 0.5 mg via INTRAVENOUS
  Administered 2018-11-17 (×3): 1 mg via INTRAVENOUS
  Filled 2018-11-16 (×6): qty 1

## 2018-11-16 MED ORDER — DEXAMETHASONE SODIUM PHOSPHATE 10 MG/ML IJ SOLN
10.0000 mg | Freq: Two times a day (BID) | INTRAMUSCULAR | Status: DC
  Administered 2018-11-17: 10 mg via INTRAVENOUS
  Filled 2018-11-16: qty 1

## 2018-11-16 MED ORDER — DEXAMETHASONE SODIUM PHOSPHATE 10 MG/ML IJ SOLN
INTRAMUSCULAR | Status: AC
  Filled 2018-11-16: qty 1

## 2018-11-16 MED ORDER — PHENYLEPHRINE 40 MCG/ML (10ML) SYRINGE FOR IV PUSH (FOR BLOOD PRESSURE SUPPORT)
PREFILLED_SYRINGE | INTRAVENOUS | Status: AC
  Filled 2018-11-16: qty 10

## 2018-11-16 MED ORDER — ASPIRIN EC 325 MG PO TBEC: 325.0000 mg | DELAYED_RELEASE_TABLET | Freq: Two times a day (BID) | ORAL | 0 refills | Status: DC

## 2018-11-16 MED ORDER — LACTATED RINGERS IV SOLN
INTRAVENOUS | Status: DC
  Administered 2018-11-16: 11:00:00 via INTRAVENOUS

## 2018-11-16 MED ORDER — MEPERIDINE HCL 50 MG/ML IJ SOLN: 6.2500 mg | INTRAMUSCULAR | Status: DC | PRN

## 2018-11-16 MED ORDER — FENTANYL CITRATE (PF) 100 MCG/2ML IJ SOLN
INTRAMUSCULAR | Status: AC
  Filled 2018-11-16: qty 2

## 2018-11-16 MED ORDER — METOCLOPRAMIDE HCL 5 MG/ML IJ SOLN: 10.0000 mg | Freq: Once | INTRAMUSCULAR | Status: DC | PRN

## 2018-11-16 MED ORDER — TRANEXAMIC ACID-NACL 1000-0.7 MG/100ML-% IV SOLN
1000.0000 mg | INTRAVENOUS | Status: AC
  Administered 2018-11-16: 1000 mg via INTRAVENOUS
  Filled 2018-11-16: qty 100

## 2018-11-16 MED ORDER — ONDANSETRON HCL 4 MG/2ML IJ SOLN
INTRAMUSCULAR | Status: AC
  Filled 2018-11-16: qty 2

## 2018-11-16 MED ORDER — METHOCARBAMOL 500 MG PO TABS
500.0000 mg | ORAL_TABLET | Freq: Four times a day (QID) | ORAL | Status: DC | PRN
  Administered 2018-11-16 – 2018-11-17 (×3): 500 mg via ORAL
  Filled 2018-11-16 (×3): qty 1

## 2018-11-16 MED ORDER — BUPIVACAINE IN DEXTROSE 0.75-8.25 % IT SOLN
INTRATHECAL | Status: DC | PRN
  Administered 2018-11-16: 1.5 mL via INTRATHECAL

## 2018-11-16 MED ORDER — DEXAMETHASONE SODIUM PHOSPHATE 10 MG/ML IJ SOLN
INTRAMUSCULAR | Status: DC | PRN
  Administered 2018-11-16: 8 mg via INTRAVENOUS

## 2018-11-16 MED ORDER — LACTATED RINGERS IV SOLN: INTRAVENOUS | Status: DC

## 2018-11-16 MED ORDER — FLUTICASONE-SALMETEROL 232-14 MCG/ACT IN AEPB: 2.0000 | INHALATION_SPRAY | Freq: Every day | RESPIRATORY_TRACT | Status: DC

## 2018-11-16 MED ORDER — ONDANSETRON HCL 4 MG/2ML IJ SOLN: 4.0000 mg | Freq: Four times a day (QID) | INTRAMUSCULAR | Status: DC | PRN

## 2018-11-16 MED ORDER — MIDAZOLAM HCL 2 MG/2ML IJ SOLN
1.0000 mg | INTRAMUSCULAR | Status: DC
  Administered 2018-11-16: 1 mg via INTRAVENOUS
  Filled 2018-11-16: qty 2

## 2018-11-16 MED ORDER — PHENYLEPHRINE 40 MCG/ML (10ML) SYRINGE FOR IV PUSH (FOR BLOOD PRESSURE SUPPORT)
PREFILLED_SYRINGE | INTRAVENOUS | Status: DC | PRN
  Administered 2018-11-16 (×2): 120 ug via INTRAVENOUS

## 2018-11-16 MED ORDER — PROPOFOL 10 MG/ML IV BOLUS
INTRAVENOUS | Status: AC
  Filled 2018-11-16: qty 20

## 2018-11-16 MED ORDER — OXYCODONE-ACETAMINOPHEN 5-325 MG PO TABS: 1.0000 | ORAL_TABLET | Freq: Four times a day (QID) | ORAL | 0 refills | Status: DC | PRN

## 2018-11-16 MED ORDER — CEFAZOLIN SODIUM-DEXTROSE 2-4 GM/100ML-% IV SOLN
2.0000 g | Freq: Four times a day (QID) | INTRAVENOUS | Status: AC
  Administered 2018-11-16 – 2018-11-17 (×2): 2 g via INTRAVENOUS
  Filled 2018-11-16 (×2): qty 100

## 2018-11-16 MED ORDER — FESOTERODINE FUMARATE ER 8 MG PO TB24
8.0000 mg | ORAL_TABLET | Freq: Every day | ORAL | Status: DC
  Administered 2018-11-16 – 2018-11-17 (×2): 8 mg via ORAL
  Filled 2018-11-16 (×2): qty 1

## 2018-11-16 MED ORDER — ONDANSETRON HCL 4 MG PO TABS: 4.0000 mg | ORAL_TABLET | Freq: Four times a day (QID) | ORAL | Status: DC | PRN

## 2018-11-16 MED ORDER — BUPIVACAINE LIPOSOME 1.3 % IJ SUSP
INTRAMUSCULAR | Status: DC | PRN
  Administered 2018-11-16: 20 mL

## 2018-11-16 MED ORDER — ACETAMINOPHEN 325 MG PO TABS
325.0000 mg | ORAL_TABLET | Freq: Four times a day (QID) | ORAL | Status: DC | PRN
  Administered 2018-11-17: 16:00:00 650 mg via ORAL
  Filled 2018-11-16: qty 2

## 2018-11-16 MED ORDER — SODIUM CHLORIDE 0.9 % IV SOLN
INTRAVENOUS | Status: DC
  Administered 2018-11-16: 17:00:00 100 mL/h via INTRAVENOUS
  Administered 2018-11-17: 03:00:00 via INTRAVENOUS

## 2018-11-16 MED ORDER — DOCUSATE SODIUM 100 MG PO CAPS: 100.0000 mg | ORAL_CAPSULE | Freq: Two times a day (BID) | ORAL | 0 refills | Status: DC

## 2018-11-16 MED ORDER — ALUM & MAG HYDROXIDE-SIMETH 200-200-20 MG/5ML PO SUSP: 30.0000 mL | ORAL | Status: DC | PRN

## 2018-11-16 MED ORDER — POVIDONE-IODINE 10 % EX SWAB
2.0000 "application " | Freq: Once | CUTANEOUS | Status: AC
  Administered 2018-11-16: 2 via TOPICAL

## 2018-11-16 MED ORDER — TIZANIDINE HCL 2 MG PO TABS: 2.0000 mg | ORAL_TABLET | Freq: Three times a day (TID) | ORAL | 0 refills | Status: DC | PRN

## 2018-11-16 MED ORDER — STERILE WATER FOR IRRIGATION IR SOLN
Status: DC | PRN
  Administered 2018-11-16: 2000 mL

## 2018-11-16 MED ORDER — GABAPENTIN 300 MG PO CAPS
300.0000 mg | ORAL_CAPSULE | Freq: Two times a day (BID) | ORAL | Status: DC
  Administered 2018-11-16 – 2018-11-17 (×2): 300 mg via ORAL
  Filled 2018-11-16 (×2): qty 1

## 2018-11-16 MED ORDER — INFLUENZA VAC SPLIT QUAD 0.5 ML IM SUSY: 0.5000 mL | PREFILLED_SYRINGE | INTRAMUSCULAR | Status: DC

## 2018-11-16 MED ORDER — POLYETHYLENE GLYCOL 3350 17 G PO PACK: 17.0000 g | PACK | Freq: Every day | ORAL | Status: DC | PRN

## 2018-11-16 SURGICAL SUPPLY — 52 items
APL SKNCLS STERI-STRIP NONHPOA (GAUZE/BANDAGES/DRESSINGS) ×1
ATTUNE PSFEM LTSZ5 NARCEM KNEE (Femur) ×1 IMPLANT
ATTUNE PSRP INSE SZ5 7 KNEE (Insert) ×1 IMPLANT
BAG SPEC THK2 15X12 ZIP CLS (MISCELLANEOUS) ×1
BAG ZIPLOCK 12X15 (MISCELLANEOUS) ×2 IMPLANT
BASEPLATE TIBIAL ROTATING SZ 4 (Knees) ×1 IMPLANT
BENZOIN TINCTURE PRP APPL 2/3 (GAUZE/BANDAGES/DRESSINGS) ×2 IMPLANT
BLADE SAGITTAL 25.0X1.19X90 (BLADE) ×2 IMPLANT
BLADE SAW SGTL 13.0X1.19X90.0M (BLADE) ×2 IMPLANT
BLADE SURG SZ10 CARB STEEL (BLADE) ×4 IMPLANT
BNDG ELASTIC 6X5.8 VLCR STR LF (GAUZE/BANDAGES/DRESSINGS) ×2 IMPLANT
BOOTIES KNEE HIGH SLOAN (MISCELLANEOUS) ×2 IMPLANT
BOWL SMART MIX CTS (DISPOSABLE) ×2 IMPLANT
BSPLAT TIB 4 CMNT ROT PLAT STR (Knees) ×1 IMPLANT
CEMENT HV SMART SET (Cement) ×4 IMPLANT
COVER SURGICAL LIGHT HANDLE (MISCELLANEOUS) ×2 IMPLANT
COVER WAND RF STERILE (DRAPES) IMPLANT
CUFF TOURN SGL QUICK 34 (TOURNIQUET CUFF) ×2
CUFF TRNQT CYL 34X4.125X (TOURNIQUET CUFF) ×1 IMPLANT
DECANTER SPIKE VIAL GLASS SM (MISCELLANEOUS) ×4 IMPLANT
DRAPE U-SHAPE 47X51 STRL (DRAPES) ×2 IMPLANT
DRSG AQUACEL AG ADV 3.5X10 (GAUZE/BANDAGES/DRESSINGS) ×2 IMPLANT
DURAPREP 26ML APPLICATOR (WOUND CARE) ×2 IMPLANT
ELECT REM PT RETURN 15FT ADLT (MISCELLANEOUS) ×2 IMPLANT
GLOVE BIOGEL PI IND STRL 8 (GLOVE) ×2 IMPLANT
GLOVE BIOGEL PI INDICATOR 8 (GLOVE) ×2
GLOVE ECLIPSE 7.5 STRL STRAW (GLOVE) ×4 IMPLANT
GOWN STRL REUS W/TWL XL LVL3 (GOWN DISPOSABLE) ×4 IMPLANT
HANDPIECE INTERPULSE COAX TIP (DISPOSABLE) ×2
HOLDER FOLEY CATH W/STRAP (MISCELLANEOUS) IMPLANT
HOOD PEEL AWAY FLYTE STAYCOOL (MISCELLANEOUS) ×6 IMPLANT
KIT TURNOVER KIT A (KITS) IMPLANT
MANIFOLD NEPTUNE II (INSTRUMENTS) ×2 IMPLANT
NEEDLE HYPO 22GX1.5 SAFETY (NEEDLE) ×2 IMPLANT
NS IRRIG 1000ML POUR BTL (IV SOLUTION) ×2 IMPLANT
PACK ICE MAXI GEL EZY WRAP (MISCELLANEOUS) ×2 IMPLANT
PACK TOTAL KNEE CUSTOM (KITS) ×2 IMPLANT
PADDING CAST COTTON 6X4 STRL (CAST SUPPLIES) ×2 IMPLANT
PATELLA MEDIAL ATTUN 35MM KNEE (Knees) ×1 IMPLANT
PIN DRILL FIX HALF THREAD (BIT) ×1 IMPLANT
PIN STEINMAN FIXATION KNEE (PIN) ×1 IMPLANT
PROTECTOR NERVE ULNAR (MISCELLANEOUS) ×2 IMPLANT
SET HNDPC FAN SPRY TIP SCT (DISPOSABLE) ×1 IMPLANT
STRIP CLOSURE SKIN 1/2X4 (GAUZE/BANDAGES/DRESSINGS) ×1 IMPLANT
SUT MNCRL AB 3-0 PS2 18 (SUTURE) ×2 IMPLANT
SUT VIC AB 0 CT1 36 (SUTURE) ×2 IMPLANT
SUT VIC AB 1 CT1 36 (SUTURE) ×4 IMPLANT
SYR CONTROL 10ML LL (SYRINGE) ×4 IMPLANT
TRAY FOLEY MTR SLVR 16FR STAT (SET/KITS/TRAYS/PACK) ×2 IMPLANT
WATER STERILE IRR 1000ML POUR (IV SOLUTION) ×4 IMPLANT
WRAP KNEE MAXI GEL POST OP (GAUZE/BANDAGES/DRESSINGS) ×1 IMPLANT
YANKAUER SUCT BULB TIP 10FT TU (MISCELLANEOUS) ×2 IMPLANT

## 2018-11-16 NOTE — H&P (Signed)
TOTAL KNEE ADMISSION H&P  Patient is being admitted for left total knee arthroplasty.  Subjective:  Chief Complaint:left knee pain.  HPI: Olivia Paul, 63 y.o. female, has a history of pain and functional disability in the left knee due to arthritis and has failed non-surgical conservative treatments for greater than 12 weeks to includeNSAID's and/or analgesics, corticosteriod injections, viscosupplementation injections, flexibility and strengthening excercises and activity modification.  Onset of symptoms was gradual, starting 4 years ago with gradually worsening course since that time. The patient noted no past surgery on the left knee(s).  Patient currently rates pain in the left knee(s) at 8 out of 10 with activity. Patient has night pain, worsening of pain with activity and weight bearing, pain that interferes with activities of daily living and joint swelling.  Patient has evidence of subchondral cysts, subchondral sclerosis, joint subluxation and joint space narrowing by imaging studies. This patient has had Failure of outpatient conservative care. There is no active infection.  Patient Active Problem List   Diagnosis Date Noted  . Unilateral primary osteoarthritis, left knee 10/29/2018  . Xanthelasma of right eyelid 03/26/2018  . Anxiety 03/26/2018  . Perennial allergic rhinitis 02/02/2018  . Moderate persistent asthma, uncomplicated 05/11/2017  . Drug allergy 05/11/2017  . Basal cell papilloma 03/20/2015  . Changing skin lesion 01/13/2015  . Asthma 11/05/2014  . Allergic rhinoconjunctivitis 11/05/2014   Past Medical History:  Diagnosis Date  . Arthritis    lt knee  . Asthma 2011  . Complication of anesthesia   . Headache    sinus  . PONV (postoperative nausea and vomiting)   . Pre-diabetes   . Recurrent upper respiratory infection (URI)     Past Surgical History:  Procedure Laterality Date  . BREAST BIOPSY    . BREAST EXCISIONAL BIOPSY    . KNEE ARTHROSCOPY WITH  MENISCAL REPAIR Left 02/21/2018    Current Facility-Administered Medications  Medication Dose Route Frequency Provider Last Rate Last Dose  . bupivacaine liposome (EXPAREL) 1.3 % injection 266 mg  20 mL Other Once Jodi Geralds, MD       Current Outpatient Medications  Medication Sig Dispense Refill Last Dose  . albuterol (PROVENTIL HFA;VENTOLIN HFA) 108 (90 Base) MCG/ACT inhaler Inhale 2 puffs into the lungs every 4 (four) hours as needed for wheezing or shortness of breath. 18 g 1   . albuterol (PROVENTIL) (2.5 MG/3ML) 0.083% nebulizer solution Use 1 vial every 4-6 hours as needed for cough, wheeze, shortness of breath, or chest tightness 180 mL 5   . Cholecalciferol (VITAMIN D) 50 MCG (2000 UT) CAPS Take 2,000 Units by mouth daily.     Marland Kitchen EPINEPHrine 0.3 mg/0.3 mL IJ SOAJ injection Inject 0.3 mg into the muscle as needed for anaphylaxis.      . fexofenadine (ALLEGRA) 180 MG tablet TAKE ONE TABLET ONCE DAILY FOR RUNNY NOSE OR ITCHING (Patient taking differently: Take 180 mg by mouth daily. ) 90 tablet 3   . Fluticasone-Salmeterol 232-14 MCG/ACT AEPB Inhale 2 puffs into the lungs 2 (two) times daily. (Patient taking differently: Inhale 2 puffs into the lungs daily. ) 1 each 0   . hydroxypropyl methylcellulose / hypromellose (ISOPTO TEARS / GONIOVISC) 2.5 % ophthalmic solution Place 1 drop into both eyes 3 (three) times daily as needed for dry eyes.     Boris Lown Oil (OMEGA-3) 500 MG CAPS Take 500 mg by mouth daily.     . magnesium oxide (MAG-OX) 400 MG tablet Take 400 mg by  mouth daily.     . Menthol-Methyl Salicylate (TIGER BALM LINIMENT EX) Apply 1 application topically at bedtime as needed (pain).     . montelukast (SINGULAIR) 10 MG tablet TAKE 1 TABLET BY MOUTH EVERYDAY AT BEDTIME (Patient taking differently: Take 10 mg by mouth at bedtime. ) 30 tablet 5   . tolterodine (DETROL LA) 4 MG 24 hr capsule Take 4 mg by mouth daily.  2   . traMADol (ULTRAM) 50 MG tablet Take 50 mg by mouth every 8  (eight) hours as needed for moderate pain.      . vitamin E 400 UNIT capsule Take 400 Units by mouth daily.      Allergies  Allergen Reactions  . Erythromycin Nausea And Vomiting  . Meloxicam     Bruised her legs all over  "Fixed drug eruption"  . Nsaids     Scarring and bruising of legs  . Sulfa Antibiotics     Mouth broke out in sores    Social History   Tobacco Use  . Smoking status: Never Smoker  . Smokeless tobacco: Never Used  Substance Use Topics  . Alcohol use: No    Alcohol/week: 0.0 standard drinks    Family History  Problem Relation Age of Onset  . Allergic rhinitis Mother   . Allergic rhinitis Father   . Asthma Father   . Urticaria Sister   . Breast cancer Maternal Aunt   . Breast cancer Paternal Aunt   . Breast cancer Paternal Grandmother   . Angioedema Neg Hx   . Atopy Neg Hx   . Eczema Neg Hx   . Immunodeficiency Neg Hx      ROS ROS: I have reviewed the patient's review of systems thoroughly and there are no positive responses as relates to the HPI. Objective:  Physical Exam  Vital signs in last 24 hours:   Well-developed well-nourished patient in no acute distress. Alert and oriented x3 HEENT:within normal limits Cardiac: Regular rate and rhythm Pulmonary: Lungs clear to auscultation Abdomen: Soft and nontender.  Normal active bowel sounds  Musculoskeletal: Left knee: Painful range of motion.  Limited range of motion.  No instability.  Mild varus malalignment.  Trace effusion. Labs: Recent Results (from the past 2160 hour(s))  Basic metabolic panel     Status: Abnormal   Collection Time: 11/08/18  9:30 AM  Result Value Ref Range   Sodium 139 135 - 145 mmol/L   Potassium 4.2 3.5 - 5.1 mmol/L   Chloride 105 98 - 111 mmol/L   CO2 28 22 - 32 mmol/L   Glucose, Bld 102 (H) 70 - 99 mg/dL   BUN 13 8 - 23 mg/dL   Creatinine, Ser 1.610.74 0.44 - 1.00 mg/dL   Calcium 9.0 8.9 - 09.610.3 mg/dL   GFR calc non Af Amer >60 >60 mL/min   GFR calc Af Amer >60  >60 mL/min   Anion gap 6 5 - 15    Comment: Performed at [facility], [address] [place], [place] [address]  Surgical pcr screen     Status: None   Collection Time: 11/08/18  9:30 AM   Specimen: Nasal Mucosa; Nasal Swab  Result Value Ref Range   MRSA, PCR NEGATIVE NEGATIVE   Staphylococcus aureus NEGATIVE NEGATIVE    Comment: (NOTE) The Xpert SA Assay (FDA approved for NASAL specimens in patients 62 years of age and older), is one component of a comprehensive surveillance program. It is not intended to diagnose  infection nor to guide or monitor treatment. Performed at [facility], [place] [address], [place], [place] [address]   Hemoglobin A1c     Status: None   Collection Time: 11/08/18  9:30 AM  Result Value Ref Range   Hgb A1c MFr Bld 5.5 4.8 - 5.6 %    Comment: (NOTE) Pre diabetes:          5.7%-6.4% Diabetes:              >6.4% Glycemic control for   <7.0% adults with diabetes    Mean Plasma Glucose 111.15 mg/dL    Comment: Performed at [place] [address] [place], [place] [address]  CBC with Differential/Platelet     Status: None   Collection Time: 11/08/18  9:30 AM  Result Value Ref Range   WBC 9.7 4.0 - 10.5 K/uL   RBC 4.61 3.87 - 5.11 MIL/uL   Hemoglobin 13.1 12.0 - 15.0 g/dL   HCT 42.4 36.0 - 46.0 %   MCV 92.0 80.0 - 100.0 fL   MCH 28.4 26.0 - 34.0 pg   MCHC 30.9 30.0 - 36.0 g/dL   RDW 14.9 11.5 - 15.5 %   Platelets 373 150 - 400 K/uL   nRBC 0.0 0.0 - 0.2 %   Neutrophils Relative % 68 %   Neutro Abs 6.7 1.7 - 7.7 K/uL   Lymphocytes Relative 23 %   Lymphs Abs 2.2 0.7 - 4.0 K/uL   Monocytes Relative 7 %   Monocytes Absolute 0.6 0.1 - 1.0 K/uL   Eosinophils Relative 2 %   Eosinophils Absolute 0.2 0.0 - 0.5 K/uL   Basophils Relative 0 %   Basophils Absolute 0.0 0.0 - 0.1 K/uL   Immature Granulocytes 0 %   Abs Immature Granulocytes 0.03 0.00 - 0.07 K/uL    Comment: Performed at [facility], [place] [address] [place], [place] [address]  Novel Coronavirus, NAA [facility] order, Send-out to Ref Lab; TAT 18-24 hrs     Status: None   Collection Time: 11/13/18 11:55 AM   Specimen: Nasopharyngeal Swab; Respiratory  Result Value Ref Range   SARS-CoV-2, NAA NOT DETECTED NOT DETECTED    Comment: (NOTE) This nucleic acid amplification test was developed and its performance characteristics determined by [employer]. Nucleic acid amplification tests include PCR and TMA. This test has not been FDA cleared or approved. This test has been authorized by FDA under an Emergency Use Authorization (EUA). This test is only authorized for the duration of time the declaration that circumstances exist justifying the authorization of the emergency use of in vitro diagnostic tests for detection of SARS-CoV-2 virus and/or diagnosis of COVID-19 infection under section 564(b)(1) of the Act, 21 U.S.C. 660YTK-1(S) (1), unless the authorization is terminated or revoked sooner. When diagnostic testing is negative, the possibility of a false negative result should be considered in the context of a patient's recent exposures and the presence of clinical signs and symptoms consistent with COVID-19. An individual without symptoms of COVID- 19 and who is not shedding SARS-CoV-2 vi rus would expect to have a negative (not detected) result in this assay. Performed At: [facility] [place], [place] [id number] Rush Farmer MD DD:[id number]    Coronavirus Source NASOPHARYNGEAL     Comment: Performed at [facility], [place] [address] [place], [place] [address]    Estimated body mass index is 39.43 kg/m as calculated from the following:  Height as of 11/12/18: 4' 10.6" (1.488 m).   Weight as of 11/12/18: 87.4 kg.   Imaging Review Plain radiographs demonstrate severe degenerative joint disease of the left knee(s). The overall alignment ismild varus. The bone quality  appears to be fair for age and reported activity level.      Assessment/Plan:  End stage arthritis, left knee   The patient history, physical examination, clinical judgment of the provider and imaging studies are consistent with end stage degenerative joint disease of the left knee(s) and total knee arthroplasty is deemed medically necessary. The treatment options including medical management, injection therapy arthroscopy and arthroplasty were discussed at length. The risks and benefits of total knee arthroplasty were presented and reviewed. The risks due to aseptic loosening, infection, stiffness, patella tracking problems, thromboembolic complications and other imponderables were discussed. The patient acknowledged the explanation, agreed to proceed with the plan and consent was signed. Patient is being admitted for inpatient treatment for surgery, pain control, PT, OT, prophylactic antibiotics, VTE prophylaxis, progressive ambulation and ADL's and discharge planning. The patient is planning to be discharged home with home health services     Patient's anticipated LOS is less than 2 midnights, meeting these requirements: - Younger than 22 - Lives within 1 hour of care - Has a competent adult at home to recover with post-op recover - NO history of  - Chronic pain requiring opiods  - Diabetes  - Coronary Artery Disease  - Heart failure  - Heart attack  - Stroke  - DVT/VTE  - Cardiac arrhythmia  - Respiratory Failure/COPD  - Renal failure  - Anemia  - Advanced Liver disease

## 2018-11-16 NOTE — Evaluation (Signed)
Physical Therapy Evaluation Patient Details Name: Olivia Paul MRN: [id number] DOB: 1955-03-13 Today's Date: 11/16/2018   History of Present Illness  Patient is 63 y.o female s/p Lt TKA on 11/16/18 with PMH significant for asthma and arthritis.  Clinical Impression  Olivia Paul is a 63 y.o. female POD 0 s/p Lt TKA. Patient reports independence with mobility and ADL's at baseline. Patient is now limited by functional impairments (see PT problem list below) and requires min assist for transfers and gait with RW and cues for safe use of walker throughout. Patient was able to ambulate ~60 feet with RW and had 2 episodes of LOB requiring min assist to steady while she was lifting walker from ground. Patient instructed in exercise to facilitate ROM and circulation to manage edema. Patient will benefit from continued skilled PT interventions to address impairments and progress towards PLOF. Acute PT will follow to progress mobility and stair training in preparation for safe discharge home.     Follow Up Recommendations Follow surgeon's recommendation for DC plan and follow-up therapies    Equipment Recommendations  None recommended by PT    Recommendations for Other Services       Precautions / Restrictions Precautions Precautions: Fall Restrictions Weight Bearing Restrictions: No      Mobility  Bed Mobility Overal bed mobility: Needs Assistance Bed Mobility: Supine to Sit     Supine to sit: Min assist;HOB elevated     General bed mobility comments: cues for seqeuncing, patient usign bed rails  Transfers Overall transfer level: Needs assistance Equipment used: Rolling walker (2 wheeled) Transfers: Sit to/from Stand Sit to Stand: Min assist         General transfer comment: verbal cues for safe hand placement and technique, min assist to initiate power up and to steady upon rising  Ambulation/Gait Ambulation/Gait assistance: Min assist Gait Distance (Feet): 60  Feet Assistive device: Rolling walker (2 wheeled) Gait Pattern/deviations: Step-through pattern;Decreased step length - right;Decreased step length - left;Decreased stride length;Decreased stance time - left;Decreased weight shift to left;Trunk flexed Gait velocity: slow and labored   General Gait Details: pt required cues throughout for safe hand placement on RW and to maintain safe proximity to walker as pt has tendency to step too close, pt with 2 episodes of LOB requiring min assist to steady  Stairs            Wheelchair Mobility    Modified Rankin (Stroke Patients Only)       Balance Overall balance assessment: Needs assistance Sitting-balance support: No upper extremity supported;Feet supported Sitting balance-[name]: Good     Standing balance support: During functional activity;Bilateral upper extremity supported Standing balance-[name]: Poor Standing balance comment: pt requires support in standing            Pertinent Vitals/Pain Pain Assessment: Faces Faces Pain Scale: Hurts whole lot Pain Location: Lt thigh Pain Descriptors / Indicators: Aching;Sore;Moaning;Grimacing;Guarding Pain Intervention(s): Monitored during session;Repositioned;Limited activity within patient's tolerance    Home Living Family/patient expects to be discharged to:: Private residence Living Arrangements: Other relatives(cousin) Available Help at Discharge: Family Type of Home: House Home Access: Stairs to enter Entrance Stairs-Rails: None Entrance Stairs-Number of Steps: 1 threshold Home Layout: Two level;Bed/bath upstairs Home Equipment: Walker - 2 wheels;Walker - 4 wheels;Cane - single point;Bedside commode      Prior Function Level of Independence: Independent           Hand Dominance        Extremity/Trunk Assessment  Upper Extremity Assessment Upper Extremity Assessment: Overall WFL for tasks assessed    Lower Extremity Assessment Lower Extremity  Assessment: LLE deficits/detail LLE Deficits / Details: pt with poor quad activaiton in supine, no buckling in WB LLE Sensation: WNL LLE Coordination: WNL       Communication   Communication: No difficulties  Cognition Arousal/Alertness: Awake/alert Behavior During Therapy: WFL for tasks assessed/performed Overall Cognitive Status: Within Functional Limits for tasks assessed             General Comments      Exercises Total Joint Exercises Ankle Circles/Pumps: AROM;Both;Seated;10 reps Quad Sets: AROM;5 reps;Seated;Supine;Left   Assessment/Plan    PT Assessment Patient needs continued PT services  PT Problem List Decreased strength;Decreased balance;Decreased range of motion;Decreased mobility;Decreased activity tolerance;Decreased knowledge of use of DME;Pain       PT Treatment Interventions DME instruction;Functional mobility training;Balance training;Patient/family education;Modalities;Therapeutic activities;Gait training;Stair training;Therapeutic exercise    PT Goals (Current goals can be found in the Care Plan section)  Acute Rehab PT Goals Patient Stated Goal: to return home PT Goal Formulation: With patient Time For Goal Achievement: 11/23/18 Potential to Achieve Goals: Good    Frequency 7X/week    AM-PAC PT "6 Clicks" Mobility  Outcome Measure Help needed turning from your back to your side while in a flat bed without using bedrails?: A Little Help needed moving from lying on your back to sitting on the side of a flat bed without using bedrails?: A Little Help needed moving to and from a bed to a chair (including a wheelchair)?: A Little Help needed standing up from a chair using your arms (e.g., wheelchair or bedside chair)?: A Little Help needed to walk in hospital room?: A Little Help needed climbing 3-5 steps with a railing? : A Lot 6 Click Score: 17    End of Session Equipment Utilized During Treatment: Gait belt Activity Tolerance: Patient  tolerated treatment well Patient left: in chair;with call bell/phone within reach;with chair alarm set Nurse Communication: Mobility status PT Visit Diagnosis: Unsteadiness on feet (R26.81);Other abnormalities of gait and mobility (R26.89);Difficulty in walking, not elsewhere classified (R26.2);Muscle weakness (generalized) (M62.81);Pain Pain - Right/Left: Left Pain - part of body: Knee    Time: 1829-9371 PT Time Calculation (min) (ACUTE ONLY): 27 min   Charges:   PT Evaluation $PT Eval Low Complexity: 1 Low PT Treatments $Gait Training: 8-22 mins        Valentino Saxon, PT, DPT, [facility] Physical Therapist with [facility]  11/16/2018 7:17 PM

## 2018-11-16 NOTE — Discharge Instructions (Signed)

## 2018-11-16 NOTE — Anesthesia Procedure Notes (Signed)
Spinal  Patient location during procedure: OR Start time: 11/16/2018 12:44 PM End time: 11/16/2018 12:48 PM Staffing Anesthesiologist: Janeece Riggers, MD Performed: anesthesiologist  Preanesthetic Checklist Completed: patient identified, surgical consent, pre-op evaluation, IV checked, risks and benefits discussed and monitors and equipment checked Spinal Block Patient position: sitting Prep: DuraPrep Patient monitoring: heart rate, continuous pulse ox and blood pressure Approach: midline Location: L3-4 Injection technique: single-shot Needle Needle type: Pencan  Needle gauge: 24 G

## 2018-11-16 NOTE — Anesthesia Procedure Notes (Signed)
Procedure Name: MAC Date/Time: 11/16/2018 12:41 PM Performed by: Niel Hummer, CRNA Pre-anesthesia Checklist: Patient identified, Emergency Drugs available, Suction available and Patient being monitored Oxygen Delivery Method: Simple face mask

## 2018-11-16 NOTE — Op Note (Signed)
PATIENT ID:      ATIA HAUPT  MRN:     [id number] DOB/AGE:    [birth year] / 62 y.o.       OPERATIVE REPORT   DATE OF PROCEDURE:  11/16/2018      PREOPERATIVE DIAGNOSIS:   OSTEOARTHRITIS LEFT KNEE      Estimated body mass index is 39.43 kg/m as calculated from the following:   Height as of 11/12/18: 4' 10.6" (1.488 m).   Weight as of 11/12/18: 87.4 kg.                                                       POSTOPERATIVE DIAGNOSIS:   OSTEOARTHRITIS LEFT KNEE                                                                       PROCEDURE:  Procedure(s): TOTAL KNEE ARTHROPLASTY Using DepuyAttune RP implants #5N Femur, #4Tibia, 7 mm Attune RP bearing, 35 Patella    SURGEON: Alta Corning  ASSISTANT:   Gaspar Skeeters  PA-C   (Present and scrubbed throughout the case, critical for assistance with exposure, retraction, instrumentation, and closure.)        ANESTHESIA: spinal, 20cc Exparel, 50cc 0.25% Marcaine EBL: min cc FLUID REPLACEMENT: unk cc crystaloid TOURNIQUET: DRAINS: None TRANEXAMIC ACID: 1gm IV, 2gm topical COMPLICATIONS:  None         INDICATIONS FOR PROCEDURE: The patient has  OSTEOARTHRITIS LEFT KNEE, varus deformities, XR shows bone on bone arthritis, lateral subluxation of tibia. Patient has failed all conservative measures including anti-inflammatory medicines, narcotics, attempts at exercise and weight loss, cortisone injections and viscosupplementation.  Risks and benefits of surgery have been discussed, questions answered.   DESCRIPTION OF PROCEDURE: The patient identified by armband, received  IV antibiotics, in the holding area at [facility]. Patient taken to the operating room, appropriate anesthetic monitors were attached, and spinal anesthesia was  induced. IV Tranexamic acid was given.Tourniquet applied high to the operative thigh. Lateral post and foot positioner applied to the table, the lower extremity was then prepped and draped in usual sterile fashion from  the toes to the tourniquet. Time-out procedure was performed. The skin and subcutaneous tissue along the incision was injected with 20 cc of a mixture of Exparel and Marcaine solution, using a 20-gauge by 1-1/2 inch needle. We began the operation, with the knee flexed 130 degrees, by making the anterior midline incision starting at handbreadth above the patella going over the patella 1 cm medial to and 4 cm distal to the tibial tubercle. Small bleeders in the skin and the subcutaneous tissue identified and cauterized. Transverse retinaculum was incised and reflected medially and a medial parapatellar arthrotomy was accomplished. the patella was everted and theprepatellar fat pad resected. The superficial medial collateral ligament was then elevated from anterior to posterior along the proximal flare of the tibia and anterior half of the menisci resected. The knee was hyperflexed exposing bone on bone arthritis. Peripheral and notch osteophytes as well as the cruciate ligaments were then resected. We continued to work  our way around posteriorly along the proximal tibia, and externally rotated the tibia subluxing it out from underneath the femur. A McHale PCL retractor was placed through the notch and a lateral Hohmann retractor placed, and we then entered the proximal tibia in line with the Depuy starter drill in line with the axis of the tibia followed by an intramedullary guide rod and 0-degree posterior slope cutting guide. The tibial cutting guide, 3degree posterior sloped, was pinned into place allowing resection of 2 mm of bone medially and 8 mm of bone laterally. Satisfied with the tibial resection, we then entered the distal femur 2 mm anterior to the PCL origin with the intramedullary guide rod and applied the distal femoral cutting guide set at 9 mm, with 5 degrees of valgus. This was pinned along the epicondylar axis. At this point, the distal femoral cut was accomplished without difficulty. We then sized  for a #5N femoral component and pinned the guide in 3 degrees of external rotation. The chamfer cutting guide was pinned into place. The anterior, posterior, and chamfer cuts were accomplished without difficulty followed by the Attune RP box cutting guide and the box cut. We also removed posterior osteophytes from the posterior femoral condyles. The posterior capsule was injected with Exparel solution. The knee was brought into full extension. We checked our extension gap and fit a 7 mm bearing. Distracting in extension with a lamina spreader,  bleeders in the posterior capsule, Posterior medial and posterior lateral gutter were cauterized.  The transexamic acid-soaked sponge was then placed in the gap of the knee in extension. The knee was flexed 30. The posterior patella cut was accomplished with the 9.5 mm Attune cutting guide, sized for a 35mm dome, and the fixation pegs drilled.The knee was then once again hyperflexed exposing the proximal tibia. We sized for a # 4 tibial base plate, applied the smokestack and the conical reamer followed by the the Delta fin keel punch. We then hammered into place the Attune RP trial femoral component, drilled the lugs, inserted a  7 mm trial bearing, trial patellar button, and took the knee through range of motion from 0-130 degrees. Medial and lateral ligamentous stability was checked. No thumb pressure was required for patellar Tracking. The tourniquet was  min. All trial components were removed, mating surfaces irrigated with pulse lavage, and dried with suction and sponges. 10 cc of the Exparel solution was applied to the cancellus bone of the patella distal femur and proximal tibia.  After waiting 30 seconds, the bony surfaces were again, dried with sponges. A double batch of DePuy HV cement was mixed and applied to all bony metallic mating surfaces except for the posterior condyles of the femur itself. In order, we hammered into place the tibial tray and removed  excess cement, the femoral component and removed excess cement. The final Attune RP bearing was inserted, and the knee brought to full extension with compression. The patellar button was clamped into place, and excess cement removed. The knee was held at 30 flexion with compression, while the cement cured. The wound was irrigated out with normal saline solution pulse lavage. The rest of the Exparel was injected into the parapatellar arthrotomy, subcutaneous tissues, and periosteal tissues. The parapatellar arthrotomy was closed with running #1 Vicryl suture. The subcutaneous tissue with 0 and 2-0 undyed Vicryl suture, and the skin with running 3-0 SQ vicryl. An Aquacil and Ace wrap were applied. The patient was taken to recovery room without difficulty.  Harvie Junior 11/16/2018, 2:17 PM

## 2018-11-16 NOTE — Anesthesia Preprocedure Evaluation (Signed)
Anesthesia Evaluation  Patient identified by MRN, date of birth, ID band Patient awake    Reviewed: Allergy & Precautions, NPO status , Patient's Chart, lab work & pertinent test results  History of Anesthesia Complications (+) PONV  Airway Mallampati: II  TM Distance: >3 FB Neck ROM: Full    Dental no notable dental hx.    Pulmonary asthma ,    Pulmonary exam normal breath sounds clear to auscultation       Cardiovascular negative cardio ROS Normal cardiovascular exam Rhythm:Regular Rate:Normal     Neuro/Psych negative neurological ROS  negative psych ROS   GI/Hepatic negative GI ROS, Neg liver ROS,   Endo/Other  Morbid obesity  Renal/GU negative Renal ROS  negative genitourinary   Musculoskeletal negative musculoskeletal ROS (+)   Abdominal   Peds negative pediatric ROS (+)  Hematology negative hematology ROS (+)   Anesthesia Other Findings   Reproductive/Obstetrics negative OB ROS                             Anesthesia Physical Anesthesia Plan  ASA: III  Anesthesia Plan: Spinal   Post-op Pain Management:  Regional for Post-op pain   Induction:   PONV Risk Score and Plan: 3 and Ondansetron and Treatment may vary due to age or medical condition  Airway Management Planned: Simple Face Mask  Additional Equipment:   Intra-op Plan:   Post-operative Plan:   Informed Consent: I have reviewed the patients History and Physical, chart, labs and discussed the procedure including the risks, benefits and alternatives for the proposed anesthesia with the patient or authorized representative who has indicated his/her understanding and acceptance.     Dental advisory given  Plan Discussed with: CRNA  Anesthesia Plan Comments:         Anesthesia Quick Evaluation

## 2018-11-16 NOTE — Transfer of Care (Signed)
Immediate Anesthesia Transfer of Care Note  Patient: Olivia Paul  Procedure(s) Performed: TOTAL KNEE ARTHROPLASTY (Left Knee)  Patient Location: PACU  Anesthesia Type:Spinal  Level of Consciousness: awake  Airway & Oxygen Therapy: Patient Spontanous Breathing and Patient connected to face mask oxygen  Post-op Assessment: Report given to RN and Post -op Vital signs reviewed and stable  Post vital signs: Reviewed and stable  Last Vitals:  Vitals Value Taken Time  BP 137/75 11/16/18 1451  Temp    Pulse 83 11/16/18 1451  Resp 35 11/16/18 1451  SpO2 97 % 11/16/18 1451    Last Pain:  Vitals:   11/16/18 1200  TempSrc:   PainSc: 0-No pain         Complications: No apparent anesthesia complications

## 2018-11-16 NOTE — Anesthesia Procedure Notes (Addendum)
Anesthesia Regional Block: Adductor canal block   Pre-Anesthetic Checklist: ,, timeout performed, Correct Patient, Correct Site, Correct Laterality, Correct Procedure, Correct Position, site marked, Risks and benefits discussed,  Surgical consent,  Pre-op evaluation,  At surgeon's request and post-op pain management  Laterality: Left and Lower  Prep: Maximum Sterile Barrier Precautions used, chloraprep       Needles:  Injection technique: Single-shot  Needle Type: Echogenic Stimulator Needle     Needle Length: 10cm      Additional Needles:   Procedures:,,,, ultrasound used (permanent image in chart),,,,  Narrative:  Start time: 11/16/2018 11:52 AM End time: 11/16/2018 12:02 PM Injection made incrementally with aspirations every 5 mL.  Performed by: Personally  Anesthesiologist: Montez Hageman, MD  Additional Notes: Risks, benefits and alternative to block explained extensively.  Patient tolerated procedure well, without complications.

## 2018-11-16 NOTE — Progress Notes (Signed)
AssistedDr. Carignan with left, ultrasound guided, adductor canal block. Side rails up, monitors on throughout procedure. See vital signs in flow sheet. Tolerated Procedure well.  

## 2018-11-17 DIAGNOSIS — M1712 Unilateral primary osteoarthritis, left knee: Secondary | ICD-10-CM | POA: Diagnosis not present

## 2018-11-17 LAB — CBC
HCT: 39.3 % (ref 36.0–46.0)
Hemoglobin: 12 g/dL (ref 12.0–15.0)
MCH: 28.4 pg (ref 26.0–34.0)
MCHC: 30.5 g/dL (ref 30.0–36.0)
MCV: 93.1 fL (ref 80.0–100.0)
Platelets: 318 10*3/uL (ref 150–400)
RBC: 4.22 MIL/uL (ref 3.87–5.11)
RDW: 14.7 % (ref 11.5–15.5)
WBC: 15.8 10*3/uL — ABNORMAL HIGH (ref 4.0–10.5)
nRBC: 0 % (ref 0.0–0.2)

## 2018-11-17 NOTE — Plan of Care (Signed)
Patient discharged home in stable condition 

## 2018-11-17 NOTE — Progress Notes (Addendum)
Physical Therapy Treatment Patient Details Name: Olivia Paul MRN: [id number] DOB: 12/12/1955 Today's Date: 11/17/2018    History of Present Illness Patient is 63 y.o female s/p Lt TKA on 11/16/18 with PMH significant for asthma and arthritis.    PT Comments    The patient is complaining of increased pain. BP 183/90 after practicing steps. RN aware.  Plans DC today if BP is stable. Provided 1 crutch for use on steps.sign   Follow Up Recommendations  Follow surgeon's recommendation for DC plan and follow-up therapies- HHPT    Equipment Recommendations  None recommended by PT    Recommendations for Other Services       Precautions / Restrictions Precautions Precautions: None Precaution Comments: BP high    Mobility  Bed Mobility   Bed Mobility: Sit to Supine       Sit to supine: Min assist   General bed mobility comments: practiced onto bed raised, used trashcan for feet to scoot back onto bed.  Transfers Overall transfer level: Needs assistance Equipment used: Rolling walker (2 wheeled) Transfers: Sit to/from Stand Sit to Stand: Supervision         General transfer comment: verbal cues for safe hand placement and technique, min assist to initiate power up and to steady upon rising  Ambulation/Gait Ambulation/Gait assistance: Min guard Gait Distance (Feet): 40 Feet(then 20') Assistive device: Rolling walker (2 wheeled) Gait Pattern/deviations: Step-through pattern;Decreased step length - right;Decreased step length - left;Decreased stride length;Decreased stance time - left;Decreased weight shift to left;Trunk flexed     General Gait Details: cues for  hand placement and safety,   Stairs Stairs: Yes Stairs assistance: Min assist Stair Management: One rail Right;Forwards;With crutches Number of Stairs: 2 General stair comments: practiced with rail on left and crutch on L   Wheelchair Mobility    Modified Rankin (Stroke Patients Only)        Balance Overall balance assessment: Needs assistance         Standing balance support: During functional activity;Bilateral upper extremity supported Standing balance-[name]: Fair                              Cognition Arousal/Alertness: Awake/alert Behavior During Therapy: Anxious                                          Exercises Total Joint Exercises Ankle Circles/Pumps: PROM Quad Sets: AROM;5 reps;Seated;Supine;Left Hip ABduction/ADduction: AAROM Straight Leg Raises: AAROM    General Comments        Pertinent Vitals/Pain Pain Score: 8  Pain Location: left knee Pain Descriptors / Indicators: Aching;Sore;Moaning;Grimacing;Guarding Pain Intervention(s): Limited activity within patient's tolerance;Monitored during session;Premedicated before session;Ice applied    Home Living                      Prior Function            PT Goals (current goals can now be found in the care plan section) Progress towards PT goals: Progressing toward goals    Frequency    7X/week      PT Plan Current plan remains appropriate    Co-evaluation              AM-PAC PT "6 Clicks" Mobility   Outcome Measure  Help needed turning from your back to your  side while in a flat bed without using bedrails?: A Little Help needed moving from lying on your back to sitting on the side of a flat bed without using bedrails?: A Little Help needed moving to and from a bed to a chair (including a wheelchair)?: A Little Help needed standing up from a chair using your arms (e.g., wheelchair or bedside chair)?: A Little Help needed to walk in hospital room?: A Little Help needed climbing 3-5 steps with a railing? : A Lot 6 Click Score: 17    End of Session   Activity Tolerance: Patient limited by pain Patient left: in bed;with call bell/phone within reach Nurse Communication: Mobility status PT Visit Diagnosis: Unsteadiness on feet  (R26.81);Other abnormalities of gait and mobility (R26.89);Difficulty in walking, not elsewhere classified (R26.2);Muscle weakness (generalized) (M62.81);Pain Pain - Right/Left: Left Pain - part of body: Knee     Time: 1400-1430 PT Time Calculation (min) (ACUTE ONLY): 30 min  Charges:  $Gait Training: 23-37 mins $Therapeutic Exercise: 8-22 mins                     Tresa Endo PT Acute Rehabilitation Services Pager [phone] Office [phone]    Claretha Cooper 11/17/2018, 3:18 PM

## 2018-11-17 NOTE — Progress Notes (Signed)
Home health agencies that serve [id number].        [place] Quality of Patient Care Rating Patient Survey Summary Rating  [facility] [phone] 3 out of 5 stars 4 out of [place] [phone] 4 out of 5 stars 4 out of [place] [phone] 4 out of 5 stars 4 out of 5 stars  [place] [phone] 4  out of 5 stars 4 out of 5 stars  [place] [phone] 4 out of 5 stars 4 out of 5 stars  [place] [phone] 4 out of 5 stars 4 out of [place] [phone] 4 out of 5 stars 4 out of [place] [phone] 3  out of 5 stars 4 out of [place] [phone] 3 out of 5 stars 4 out of [place] [phone] 2  out of 5 stars 3 out of 5 stars  [facility] [phone] 4 out of 5 stars Not [place] [phone] 3 out of 5 stars 4 out of 5 stars  [facility] (336) [phone] 3  out of 5 stars 3 out of 5 stars  [facility] [phone] 3  out of 5 stars 4 out of 5 stars  [facility] (336) [phone] 3  out of [place] [phone] 3  out of 5 stars 4 out of [place] [phone] 3  out of 5 stars 3 out of [place] [phone] 3  out of 5 stars Not [place] [phone] 3  out of 5 stars 4 out of [place] [phone] 4  out of 5 stars 3 out of [place] number Footnote as displayed on [place]  1 This agency provides services under a federal waiver program  to non-traditional, chronic long term population.  2 This agency provides services to a special needs population.  3 Not Available.  4 The number of patient episodes for this measure is too small to report.  5 This measure currently does not have data or provider has been certified/recertified for less than 6 months.  6 The national average for this measure is not provided because of state-to-state differences in data collection.  7 Medicare is not displaying rates for this measure for any home health agency, because of an issue with the data.  8 There were problems with the data and they are being corrected.  9 Zero, or very few, patients met the survey's rules for inclusion. The scores shown, if any, reflect a very small number of surveys and may not accurately tell how an agency is doing.  10 Survey results are based on less than 12 months of data.  11 Fewer than 70 patients completed the survey. Use the scores shown, if any, with caution as the number of surveys may be too low to accurately tell how an agency is doing.  12 No survey results are available for this period.  13 Data suppressed by CMS for one or more quarters.

## 2018-11-17 NOTE — Progress Notes (Addendum)
Physical Therapy Treatment Patient Details Name: Olivia Paul MRN: [id number] DOB: [birth year] Today's Date: 11/17/2018    History of Present Illness Patient is 63 y.o female s/p Lt TKA on 11/16/18 with PMH significant for asthma and arthritis.    PT Comments    The patient is mobilizing well. Assisted with there exercises. Plans for DC today. Will practice steps next visit.   Follow Up Recommendations  Follow surgeon's recommendation for DC plan and follow-up therapies     Equipment Recommendations  None recommended by PT    Recommendations for Other Services       Precautions / Restrictions Precautions Precautions: Fall;Knee    Mobility  Bed Mobility               General bed mobility comments: oob  Transfers Overall transfer level: Needs assistance Equipment used: Rolling walker (2 wheeled) Transfers: Sit to/from Stand Sit to Stand: Min assist         General transfer comment: verbal cues for safe hand placement and technique, min assist to initiate power up and to steady upon rising  Ambulation/Gait Ambulation/Gait assistance: Min assist Gait Distance (Feet): 40 Feet Assistive device: Rolling walker (2 wheeled) Gait Pattern/deviations: Step-through pattern;Decreased step length - right;Decreased step length - left;Decreased stride length;Decreased stance time - left;Decreased weight shift to left;Trunk flexed     General Gait Details: cues for  hand placement and safety,   Stairs             Wheelchair Mobility    Modified Rankin (Stroke Patients Only)       Balance Overall balance assessment: Needs assistance         Standing balance support: During functional activity;Bilateral upper extremity supported Standing balance-[name]: Fair                              Cognition Arousal/Alertness: Awake/alert                                            Exercises Total Joint Exercises Ankle  Circles/Pumps: PROM Quad Sets: AROM;5 reps;Seated;Supine;Left Hip ABduction/ADduction: AAROM Straight Leg Raises: AAROM    General Comments        Pertinent Vitals/Pain Pain Score: 6  Pain Location: Lt thigh Pain Descriptors / Indicators: Aching;Sore;Moaning;Grimacing;Guarding Pain Intervention(s): Monitored during session;Premedicated before session;Ice applied    Home Living                      Prior Function            PT Goals (current goals can now be found in the care plan section) Progress towards PT goals: Progressing toward goals    Frequency    7X/week      PT Plan  HHPT    Co-evaluation              AM-PAC PT "6 Clicks" Mobility   Outcome Measure  Help needed turning from your back to your side while in a flat bed without using bedrails?: A Little Help needed moving from lying on your back to sitting on the side of a flat bed without using bedrails?: A Little Help needed moving to and from a bed to a chair (including a wheelchair)?: A Little Help needed standing up from a chair using your arms (  e.g., wheelchair or bedside chair)?: A Little Help needed to walk in hospital room?: A Little Help needed climbing 3-5 steps with a railing? : A Lot 6 Click Score: 17    End of Session   Activity Tolerance: Patient limited by pain Patient left: in chair;with call bell/phone within reach;with chair alarm set Nurse Communication: Mobility status PT Visit Diagnosis: Unsteadiness on feet (R26.81);Other abnormalities of gait and mobility (R26.89);Difficulty in walking, not elsewhere classified (R26.2);Muscle weakness (generalized) (M62.81);Pain Pain - Right/Left: Left Pain - part of body: Knee     Time: 6073-7106 PT Time Calculation (min) (ACUTE ONLY): 34 min  Charges:  $Gait Training: 8-22 mins $Therapeutic Exercise: 8-22 mins                     Blanchard Kelch PT Acute Rehabilitation Services Pager [phone] Office  [phone]    Rada Hay 11/17/2018, 3:06 PM

## 2018-11-17 NOTE — Progress Notes (Signed)
    Patient doing well with expected L knee pain and some burning. Was just given pain meds and is pending therapy. She has been up some and is eager to progress activity. Utilized CPM yesterday with some pain. Eating well, denies N/V/D/SOB/Fatigue.     Physical Exam: BP 131/66 (BP Location: Right Arm)   Pulse 70   Temp 98.4 F (36.9 C) (Oral)   Resp 16   Ht 4\' 10"  (1.473 m)   Wt 87.4 kg   SpO2 96%   BMI 40.25 kg/m    CBC Latest Ref Rng & Units 11/17/2018 11/16/2018 11/08/2018  WBC 4.0 - 10.5 K/uL 15.8(H) 9.0 9.7  Hemoglobin 12.0 - 15.0 g/dL 12.0 12.8 13.1  Hematocrit 36.0 - 46.0 % 39.3 41.0 42.4  Platelets 150 - 400 K/uL 318 340 373    Dressing in place, CDI, Ice packs in place, pt sitting up comfortably, PT at bedside. Distal compartments soft   NVI  POD #1 s/p L TKR per Dr Berenice Primas team   - up with PT/OT, encourage ambulation - Cont current pain regimen  - d/c home today after PT with f/u in 2 weeks - CPM to be delivered Monday per Colletta [place] at office  - ASA x 1 mo DVT prophylaxis

## 2018-11-17 NOTE — TOC Initial Note (Signed)
Transition of Care [facility]) - Initial/Assessment Note    Patient Details  Name: MIYO AINA MRN: [id number] Date of Birth: 1955/10/17  Transition of Care [facility]) CM/SW Contact:    Joaquin Courts, RN Phone Number: 11/17/2018, 1:05 PM  Clinical Narrative:       CM spoke with patient at bedside. [facility]. Patient reports has rolling walker and 3-in-1 at home.             Expected Discharge Plan: [place] Barriers to Discharge: No Barriers Identified   Patient Goals and CMS Choice Patient states their goals for this hospitalization and ongoing recovery are:: to go home with therapy CMS Medicare.gov Compare Post Acute Care list provided to:: Patient Choice offered to / list presented to : Patient  Expected Discharge Plan and Services Expected Discharge Plan: [place]   Discharge Planning Services: CM Consult Post Acute Care Choice: [place] arrangements for the past 2 months: Single Family Home Expected Discharge Date: 11/17/18               DME Arranged: N/A DME Agency: NA       HH Arranged: PT HH Agency: [place] ([place]) Date [place]: 11/17/18 Time [place]: [place] Representative spoke with at [place]: [place] Arrangements/Services Living arrangements for the past 2 months: [place] with:: Relatives Patient language and need for interpreter reviewed:: Yes Do you feel safe going back to the place where you live?: Yes      Need for Family Participation in Patient Care: Yes (Comment) Care giver support system in place?: Yes (comment)   Criminal Activity/Legal Involvement Pertinent to Current Situation/Hospitalization: No - Comment as needed  Activities of Daily Living Home Assistive Devices/Equipment: Eyeglasses, Walker (specify type), Raised toilet seat with rails, Bedside commode/3-in-1 ADL Screening  (condition at time of admission) Patient's cognitive ability adequate to safely complete daily activities?: Yes Is the patient deaf or have difficulty hearing?: No Does the patient have difficulty seeing, even when wearing glasses/contacts?: No Does the patient have difficulty concentrating, remembering, or making decisions?: No Patient able to express need for assistance with ADLs?: Yes Does the patient have difficulty dressing or bathing?: No Independently performs ADLs?: Yes (appropriate for developmental age) Does the patient have difficulty walking or climbing stairs?: No Weakness of Legs: None Weakness of Arms/Hands: None  Permission Sought/Granted   Permission granted to share information with : Yes, Verbal Permission Granted     Permission granted to share info w AGENC[facility]        Emotional Assessment Appearance:: Appears stated age Attitude/Demeanor/Rapport: Engaged Affect (typically observed): Accepting Orientation: : Oriented to Place, Oriented to  Time, Oriented to Situation, Oriented to Self   Psych Involvement: No (comment)  Admission diagnosis:  OSTEOARTHRITIS LEFT KNEE Patient Active Problem List   Diagnosis Date Noted  . Primary osteoarthritis of left knee 11/16/2018  . Unilateral primary osteoarthritis, left knee 10/29/2018  . Xanthelasma of right eyelid 03/26/2018  . Anxiety 03/26/2018  . Perennial allergic rhinitis 02/02/2018  . Moderate persistent asthma, uncomplicated 51/03/5850  . Drug allergy 05/11/2017  . Basal cell papilloma 03/20/2015  . Changing skin lesion 01/13/2015  . Asthma 11/05/2014  . Allergic rhinoconjunctivitis 11/05/2014   PCP:  Glendale Chard, MD Pharmacy:   [id number] [place], [place] [address], [address] AT [facility]  [address], [address] [place] [address] Phone: [phone] Fax: [phone]     Social Determinants of Health (SDOH) Interventions     Readmission Risk Interventions No flowsheet data found.

## 2018-11-18 NOTE — Progress Notes (Signed)
OT NOTE  Unit secretary stopping OT staff to request a copy of shoulder exercises for patient. OT locating patient discharge chart and noting NO OT order and L TKA in orders. OT leaving note in chart of event and justification for opening patients chart after pt had discharged.   Jeri Modena, OTR/L  Acute Rehabilitation Services Pager: [phone] Office: [phone] .

## 2018-11-19 ENCOUNTER — Encounter (HOSPITAL_COMMUNITY): Payer: Self-pay | Admitting: Orthopedic Surgery

## 2018-11-19 NOTE — Anesthesia Postprocedure Evaluation (Signed)
Anesthesia Post Note  Patient: Olivia Paul  Procedure(s) Performed: TOTAL KNEE ARTHROPLASTY (Left Knee)     Patient location during evaluation: PACU Anesthesia Type: Spinal Level of consciousness: oriented and awake and alert Pain management: pain level controlled Vital Signs Assessment: post-procedure vital signs reviewed and stable Respiratory status: spontaneous breathing and respiratory function stable Cardiovascular status: blood pressure returned to baseline and stable Postop Assessment: no headache, no backache and no apparent nausea or vomiting Anesthetic complications: no    Last Vitals:  Vitals:   11/17/18 1515 11/17/18 1716  BP: (!) 157/85 (!) 152/82  Pulse: (!) 101 (!) 106  Resp: 16   Temp: 36.9 C   SpO2: 95%     Last Pain:  Vitals:   11/17/18 1515  TempSrc: Oral  PainSc:                  Lynda Rainwater

## 2018-12-20 ENCOUNTER — Other Ambulatory Visit: Payer: Self-pay | Admitting: Obstetrics and Gynecology

## 2018-12-20 DIAGNOSIS — M8580 Other specified disorders of bone density and structure, unspecified site: Secondary | ICD-10-CM

## 2018-12-20 DIAGNOSIS — Z1231 Encounter for screening mammogram for malignant neoplasm of breast: Secondary | ICD-10-CM

## 2018-12-31 ENCOUNTER — Other Ambulatory Visit: Payer: Self-pay | Admitting: Allergy and Immunology

## 2019-01-14 ENCOUNTER — Encounter: Payer: Self-pay | Admitting: Internal Medicine

## 2019-02-04 ENCOUNTER — Other Ambulatory Visit: Payer: Self-pay

## 2019-02-04 ENCOUNTER — Telehealth: Payer: Self-pay

## 2019-02-04 MED ORDER — EPINEPHRINE 0.3 MG/0.3ML IJ SOAJ: 0.3000 mg | INTRAMUSCULAR | 1 refills | Status: AC | PRN

## 2019-02-04 NOTE — Telephone Encounter (Signed)
Patient called requesting a refill on her EPI PEN.  [facility]

## 2019-02-04 NOTE — Telephone Encounter (Signed)
Sent in rx refill 

## 2019-02-11 ENCOUNTER — Other Ambulatory Visit: Payer: Self-pay

## 2019-02-11 ENCOUNTER — Ambulatory Visit
Admission: RE | Admit: 2019-02-11 | Discharge: 2019-02-11 | Disposition: A | Discharge status: Home / Self Care | Payer: 59 | Source: Ambulatory Visit | Attending: Obstetrics and Gynecology | Admitting: Obstetrics and Gynecology

## 2019-02-11 DIAGNOSIS — Z1231 Encounter for screening mammogram for malignant neoplasm of breast: Secondary | ICD-10-CM

## 2019-03-11 ENCOUNTER — Ambulatory Visit
Admission: RE | Admit: 2019-03-11 | Discharge: 2019-03-11 | Disposition: A | Discharge status: Home / Self Care | Payer: 59 | Source: Ambulatory Visit | Attending: Obstetrics and Gynecology | Admitting: Obstetrics and Gynecology

## 2019-03-11 ENCOUNTER — Other Ambulatory Visit: Payer: Self-pay

## 2019-03-11 DIAGNOSIS — M8580 Other specified disorders of bone density and structure, unspecified site: Secondary | ICD-10-CM

## 2019-04-01 ENCOUNTER — Encounter: Payer: 59 | Admitting: Nurse Practitioner

## 2019-04-03 ENCOUNTER — Encounter: Payer: Self-pay | Admitting: Internal Medicine

## 2019-04-03 ENCOUNTER — Ambulatory Visit: Payer: 59 | Admitting: Internal Medicine

## 2019-04-03 ENCOUNTER — Other Ambulatory Visit: Payer: Self-pay

## 2019-04-03 VITALS — BP 130/74 | HR 80 | Temp 98.9°F | Ht 59.0 in | Wt 195.4 lb

## 2019-04-03 DIAGNOSIS — Z6839 Body mass index (BMI) 39.0-39.9, adult: Secondary | ICD-10-CM

## 2019-04-03 DIAGNOSIS — Z0000 Encounter for general adult medical examination without abnormal findings: Secondary | ICD-10-CM | POA: Diagnosis not present

## 2019-04-03 DIAGNOSIS — E559 Vitamin D deficiency, unspecified: Secondary | ICD-10-CM

## 2019-04-03 DIAGNOSIS — N390 Urinary tract infection, site not specified: Secondary | ICD-10-CM | POA: Diagnosis not present

## 2019-04-03 LAB — POCT URINALYSIS DIPSTICK
Bilirubin, UA: NEGATIVE
Glucose, UA: NEGATIVE
Ketones, UA: NEGATIVE
Nitrite, UA: POSITIVE
Protein, UA: NEGATIVE
Spec Grav, UA: 1.02 (ref 1.010–1.025)
Urobilinogen, UA: 0.2 E.U./dL
pH, UA: 5.5 (ref 5.0–8.0)

## 2019-04-03 NOTE — Patient Instructions (Signed)
Health Maintenance, Female Adopting a healthy lifestyle and getting preventive care are important in promoting health and wellness. Ask your health care provider about:  The right schedule for you to have regular tests and exams.  Things you can do on your own to prevent diseases and keep yourself healthy. What should I know about diet, weight, and exercise? Eat a healthy diet   Eat a diet that includes plenty of vegetables, fruits, low-fat dairy products, and lean protein.  Do not eat a lot of foods that are high in solid fats, added sugars, or sodium. Maintain a healthy weight Body mass index (BMI) is used to identify weight problems. It estimates body fat based on height and weight. Your health care provider can help determine your BMI and help you achieve or maintain a healthy weight. Get regular exercise Get regular exercise. This is one of the most important things you can do for your health. Most adults should:  Exercise for at least 150 minutes each week. The exercise should increase your heart rate and make you sweat (moderate-intensity exercise).  Do strengthening exercises at least twice a week. This is in addition to the moderate-intensity exercise.  Spend less time sitting. Even light physical activity can be beneficial. Watch cholesterol and blood lipids Have your blood tested for lipids and cholesterol at 63 years of age, then have this test every 5 years. Have your cholesterol levels checked more often if:  Your lipid or cholesterol levels are high.  You are older than 63 years of age.  You are at high risk for heart disease. What should I know about cancer screening? Depending on your health history and family history, you may need to have cancer screening at various ages. This may include screening for:  Breast cancer.  Cervical cancer.  Colorectal cancer.  Skin cancer.  Lung cancer. What should I know about heart disease, diabetes, and high blood  pressure? Blood pressure and heart disease  High blood pressure causes heart disease and increases the risk of stroke. This is more likely to develop in people who have high blood pressure readings, are of African descent, or are overweight.  Have your blood pressure checked: ? Every 3-5 years if you are 18-39 years of age. ? Every year if you are 40 years old or older. Diabetes Have regular diabetes screenings. This checks your fasting blood sugar level. Have the screening done:  Once every three years after age 40 if you are at a normal weight and have a low risk for diabetes.  More often and at a younger age if you are overweight or have a high risk for diabetes. What should I know about preventing infection? Hepatitis B If you have a higher risk for hepatitis B, you should be screened for this virus. Talk with your health care provider to find out if you are at risk for hepatitis B infection. Hepatitis C Testing is recommended for:  Everyone born from 1945 through 1965.  Anyone with known risk factors for hepatitis C. Sexually transmitted infections (STIs)  Get screened for STIs, including gonorrhea and chlamydia, if: ? You are sexually active and are younger than 63 years of age. ? You are older than 63 years of age and your health care provider tells you that you are at risk for this type of infection. ? Your sexual activity has changed since you were last screened, and you are at increased risk for chlamydia or gonorrhea. Ask your health care provider if   you are at risk.  Ask your health care provider about whether you are at high risk for HIV. Your health care provider may recommend a prescription medicine to help prevent HIV infection. If you choose to take medicine to prevent HIV, you should first get tested for HIV. You should then be tested every 3 months for as long as you are taking the medicine. Pregnancy  If you are about to stop having your period (premenopausal) and  you may become pregnant, seek counseling before you get pregnant.  Take 400 to 800 micrograms (mcg) of folic acid every day if you become pregnant.  Ask for birth control (contraception) if you want to prevent pregnancy. Osteoporosis and menopause Osteoporosis is a disease in which the bones lose minerals and strength with aging. This can result in bone fractures. If you are 65 years old or older, or if you are at risk for osteoporosis and fractures, ask your health care provider if you should:  Be screened for bone loss.  Take a calcium or vitamin D supplement to lower your risk of fractures.  Be given hormone replacement therapy (HRT) to treat symptoms of menopause. Follow these instructions at home: Lifestyle  Do not use any products that contain nicotine or tobacco, such as cigarettes, e-cigarettes, and chewing tobacco. If you need help quitting, ask your health care provider.  Do not use street drugs.  Do not share needles.  Ask your health care provider for help if you need support or information about quitting drugs. Alcohol use  Do not drink alcohol if: ? Your health care provider tells you not to drink. ? You are pregnant, may be pregnant, or are planning to become pregnant.  If you drink alcohol: ? Limit how much you use to 0-1 drink a day. ? Limit intake if you are breastfeeding.  Be aware of how much alcohol is in your drink. In the [place] one drink equals one 12 oz bottle of beer (355 mL), one 5 oz glass of wine (148 mL), or one 1 oz glass of hard liquor (44 mL). General instructions  Schedule regular health, dental, and eye exams.  Stay current with your vaccines.  Tell your health care provider if: ? You often feel depressed. ? You have ever been abused or do not feel safe at home. Summary  Adopting a healthy lifestyle and getting preventive care are important in promoting health and wellness.  Follow your health care provider's instructions about healthy  diet, exercising, and getting tested or screened for diseases.  Follow your health care provider's instructions on monitoring your cholesterol and blood pressure. This information is not intended to replace advice given to you by your health care provider. Make sure you discuss any questions you have with your health care provider. Document Revised: 01/24/2018 Document Reviewed: 01/24/2018 Elsevier Patient Education  2020 Elsevier Inc.  

## 2019-04-04 ENCOUNTER — Encounter: Payer: Self-pay | Admitting: Internal Medicine

## 2019-04-04 LAB — CMP14+EGFR
ALT: 15 IU/L (ref 0–32)
AST: 18 IU/L (ref 0–40)
Albumin/Globulin Ratio: 1.2 (ref 1.2–2.2)
Albumin: 4.2 g/dL (ref 3.8–4.8)
Alkaline Phosphatase: 115 IU/L (ref 39–117)
BUN/Creatinine Ratio: 21 (ref 12–28)
BUN: 13 mg/dL (ref 8–27)
Bilirubin Total: <0.2 mg/dL (ref 0.0–1.2)
CO2: 25 mmol/L (ref 20–29)
Calcium: 9.7 mg/dL (ref 8.7–10.3)
Chloride: 100 mmol/L (ref 96–106)
Creatinine, Ser: 0.61 mg/dL (ref 0.57–1.00)
GFR calc Af Amer: 112 mL/min/{1.73_m2} (ref >59)
GFR calc non Af Amer: 97 mL/min/{1.73_m2} (ref >59)
Globulin, Total: 3.5 g/dL (ref 1.5–4.5)
Glucose: 86 mg/dL (ref 65–99)
Potassium: 4.1 mmol/L (ref 3.5–5.2)
Sodium: 140 mmol/L (ref 134–144)
Total Protein: 7.7 g/dL (ref 6.0–8.5)

## 2019-04-04 LAB — CBC
Hematocrit: 40.2 % (ref 34.0–46.6)
Hemoglobin: 13.4 g/dL (ref 11.1–15.9)
MCH: 26.8 pg (ref 26.6–33.0)
MCHC: 33.3 g/dL (ref 31.5–35.7)
MCV: 80 fL (ref 79–97)
Platelets: 435 10*3/uL (ref 150–450)
RBC: 5 x10E6/uL (ref 3.77–5.28)
RDW: 15.2 % (ref 11.7–15.4)
WBC: 11.1 10*3/uL — ABNORMAL HIGH (ref 3.4–10.8)

## 2019-04-04 LAB — HEMOGLOBIN A1C
Est. average glucose Bld gHb Est-mCnc: 123 mg/dL
Hgb A1c MFr Bld: 5.9 % — ABNORMAL HIGH (ref 4.8–5.6)

## 2019-04-04 LAB — LIPID PANEL
Chol/HDL Ratio: 3.1 ratio (ref 0.0–4.4)
Cholesterol, Total: 172 mg/dL (ref 100–199)
HDL: 56 mg/dL (ref >39)
LDL Chol Calc (NIH): 106 mg/dL — ABNORMAL HIGH (ref 0–99)
Triglycerides: 48 mg/dL (ref 0–149)
VLDL Cholesterol Cal: 10 mg/dL (ref 5–40)

## 2019-04-04 LAB — VITAMIN D 25 HYDROXY (VIT D DEFICIENCY, FRACTURES): Vit D, 25-Hydroxy: 57.5 ng/mL (ref 30.0–100.0)

## 2019-04-05 NOTE — Progress Notes (Signed)
This visit occurred during the SARS-CoV-2 public health emergency.  Safety protocols were in place, including screening questions prior to the visit, additional usage of staff PPE, and extensive cleaning of exam room while observing appropriate contact time as indicated for disinfecting solutions.  Subjective:     Patient ID: Olivia Paul , female    DOB: 1955-10-02 , 64 y.o.   MRN: [id number]   Chief Complaint  Patient presents with  . Annual Exam    HPI  She is here today for a full physical examination. She is followed by Dr. Simona Huh for her GYN care. She has no specific concerns or complaints at this time.     Past Medical History:  Diagnosis Date  . Arthritis    lt knee  . Asthma 2011  . Complication of anesthesia   . Headache    sinus  . PONV (postoperative nausea and vomiting)   . Pre-diabetes   . Recurrent upper respiratory infection (URI)      Family History  Problem Relation Age of Onset  . Allergic rhinitis Mother   . Allergic rhinitis Father   . Asthma Father   . Urticaria Sister   . Breast cancer Maternal Aunt   . Breast cancer Paternal Aunt   . Breast cancer Paternal Grandmother   . Angioedema Neg Hx   . Atopy Neg Hx   . Eczema Neg Hx   . Immunodeficiency Neg Hx      Current Outpatient Medications:  .  albuterol (PROVENTIL HFA;VENTOLIN HFA) 108 (90 Base) MCG/ACT inhaler, Inhale 2 puffs into the lungs every 4 (four) hours as needed for wheezing or shortness of breath., Disp: 18 g, Rfl: 1 .  albuterol (PROVENTIL) (2.5 MG/3ML) 0.083% nebulizer solution, Use 1 vial every 4-6 hours as needed for cough, wheeze, shortness of breath, or chest tightness, Disp: 180 mL, Rfl: 5 .  Cholecalciferol (VITAMIN D) 50 MCG (2000 UT) CAPS, Take 2,000 Units by mouth daily., Disp: , Rfl:  .  EPINEPHrine 0.3 mg/0.3 mL IJ SOAJ injection, Inject 0.3 mLs (0.3 mg total) into the muscle as needed for anaphylaxis., Disp: 1 each, Rfl: 1 .  fexofenadine (ALLEGRA) 180 MG tablet,  TAKE ONE TABLET ONCE DAILY FOR RUNNY NOSE OR ITCHING (Patient taking differently: Take 180 mg by mouth daily. ), Disp: 90 tablet, Rfl: 3 .  Fluticasone-Salmeterol 232-14 MCG/ACT AEPB, TAKE 2 PUFFS BY MOUTH TWICE A DAY, Disp: 1 each, Rfl: 2 .  hydroxypropyl methylcellulose / hypromellose (ISOPTO TEARS / GONIOVISC) 2.5 % ophthalmic solution, Place 1 drop into both eyes 3 (three) times daily as needed for dry eyes., Disp: , Rfl:  .  Krill Oil (OMEGA-3) 500 MG CAPS, Take 500 mg by mouth daily., Disp: , Rfl:  .  magnesium oxide (MAG-OX) 400 MG tablet, Take 400 mg by mouth daily., Disp: , Rfl:  .  Menthol-Methyl Salicylate (TIGER BALM LINIMENT EX), Apply 1 application topically at bedtime as needed (pain)., Disp: , Rfl:  .  montelukast (SINGULAIR) 10 MG tablet, TAKE 1 TABLET BY MOUTH EVERYDAY AT BEDTIME (Patient taking differently: Take 10 mg by mouth at bedtime. ), Disp: 30 tablet, Rfl: 5 .  tiZANidine (ZANAFLEX) 2 MG tablet, Take 1 tablet (2 mg total) by mouth every 8 (eight) hours as needed for muscle spasms., Disp: 40 tablet, Rfl: 0 .  tolterodine (DETROL LA) 4 MG 24 hr capsule, Take 4 mg by mouth daily., Disp: , Rfl: 2 .  traMADol (ULTRAM) 50 MG tablet, Take 50 mg  by mouth every 8 (eight) hours as needed for moderate pain. , Disp: , Rfl:  .  vitamin E 400 UNIT capsule, Take 400 Units by mouth daily., Disp: , Rfl:  .  amitriptyline (ELAVIL) 25 MG tablet, Take 25-50 mg by mouth at bedtime., Disp: , Rfl:  .  aspirin EC 325 MG tablet, Take 1 tablet (325 mg total) by mouth 2 (two) times daily after a meal. Take x 1 month post op to decrease risk of blood clots. (Patient not taking: Reported on 04/03/2019), Disp: 60 tablet, Rfl: 0 .  docusate sodium (COLACE) 100 MG capsule, Take 1 capsule (100 mg total) by mouth 2 (two) times daily. (Patient not taking: Reported on 04/03/2019), Disp: 30 capsule, Rfl: 0 .  oxyCODONE-acetaminophen (PERCOCET/ROXICET) 5-325 MG tablet, Take 1-2 tablets by mouth every 6 (six) hours  as needed for severe pain. (Patient not taking: Reported on 04/03/2019), Disp: 40 tablet, Rfl: 0   Allergies  Allergen Reactions  . Erythromycin Nausea And Vomiting  . Meloxicam     Bruised her legs all over  "Fixed drug eruption"  . Nsaids     Scarring and bruising of legs  . Sulfa Antibiotics     Mouth broke out in sores     The patient states she uses none for birth control. Last LMP was No LMP recorded. Patient is postmenopausal.. Negative for Dysmenorrhea  Negative for: breast discharge, breast lump(s), breast pain and breast self exam. Associated symptoms include abnormal vaginal bleeding. Pertinent negatives include abnormal bleeding (hematology), anxiety, decreased libido, depression, difficulty falling sleep, dyspareunia, history of infertility, nocturia, sexual dysfunction, sleep disturbances, urinary incontinence, urinary urgency, vaginal discharge and vaginal itching. Diet regular.The patient states her exercise level is  intermittent.   . The patient's tobacco use is:  Social History   Tobacco Use  Smoking Status Never Smoker  Smokeless Tobacco Never Used  . She has been exposed to passive smoke. The patient's alcohol use is:  Social History   Substance and Sexual Activity  Alcohol Use No  . Alcohol/week: 0.0 standard drinks    Review of Systems  Constitutional: Negative.   HENT: Negative.   Eyes: Negative.   Respiratory: Negative.   Cardiovascular: Negative.   Endocrine: Negative.   Genitourinary: Negative.   Musculoskeletal: Negative.   Skin: Negative.   Allergic/Immunologic: Negative.   Neurological: Negative.   Hematological: Negative.   Psychiatric/Behavioral: Negative.      Today's Vitals   04/03/19 1444  BP: 130/74  Pulse: 80  Temp: 98.9 F (37.2 C)  Weight: 195 lb 6.4 oz (88.6 kg)  Height: '4\' 11"'  (1.499 m)   Body mass index is 39.47 kg/m.  Wt Readings from Last 3 Encounters:  04/03/19 195 lb 6.4 oz (88.6 kg)  11/16/18 192 lb 9.6 oz (87.4  kg)  11/12/18 192 lb 9.6 oz (87.4 kg)     Objective:  Physical Exam Vitals and nursing note reviewed.  Constitutional:      Appearance: Normal appearance. She is obese.  HENT:     Head: Normocephalic and atraumatic.     Right Ear: Tympanic membrane, ear canal and external ear normal.     Left Ear: Tympanic membrane, ear canal and external ear normal.     Nose:     Comments: Deferred, masked    Mouth/Throat:     Comments: Deferred, masked Eyes:     Extraocular Movements: Extraocular movements intact.     Conjunctiva/sclera: Conjunctivae normal.     Pupils: Pupils  are equal, round, and reactive to light.  Cardiovascular:     Rate and Rhythm: Normal rate and regular rhythm.     Pulses: Normal pulses.     Heart sounds: Normal heart sounds.  Pulmonary:     Effort: Pulmonary effort is normal.     Breath sounds: Normal breath sounds.  Chest:     Breasts: Tanner Score is 5.        Right: Normal.        Left: Normal.  Abdominal:     General: Bowel sounds are normal.     Palpations: Abdomen is soft.     Comments: Obese, soft, difficult to assess organomegaly  Genitourinary:    Comments: deferred Musculoskeletal:        General: Normal range of motion.     Cervical back: Normal range of motion and neck supple.     Comments: Healed surgical scar left knee  Skin:    General: Skin is warm and dry.  Neurological:     General: No focal deficit present.     Mental Status: She is alert and oriented to person, place, and time.  Psychiatric:        Mood and Affect: Mood normal.        Behavior: Behavior normal.         Assessment And Plan:     1. Routine general medical examination at health care facility  A full exam was performed.  Importance of monthly self breast exams was discussed with the patient .PATIENT IS ADVISED TO GET 30-45 MINUTES REGULAR EXERCISE NO LESS THAN FOUR TO FIVE DAYS PER WEEK - BOTH WEIGHTBEARING EXERCISES AND AEROBIC ARE RECOMMENDED.  SHE IS ADVISED TO  FOLLOW A HEALTHY DIET WITH AT LEAST SIX FRUITS/VEGGIES PER DAY, DECREASE INTAKE OF RED MEAT, AND TO INCREASE FISH INTAKE TO TWO DAYS PER WEEK.  MEATS/FISH SHOULD NOT BE FRIED, BAKED OR BROILED IS PREFERABLE.  I SUGGEST WEARING SPF 50 SUNSCREEN ON EXPOSED PARTS AND ESPECIALLY WHEN IN THE DIRECT SUNLIGHT FOR AN EXTENDED PERIOD OF TIME.  PLEASE AVOID FAST FOOD RESTAURANTS AND INCREASE YOUR WATER INTAKE.  - CMP14+EGFR - CBC - Lipid panel - Hemoglobin A1c  2. Vitamin D deficiency disease  I WILL CHECK A VIT D LEVEL AND SUPPLEMENT AS NEEDED.  ALSO ENCOURAGED TO SPEND 15 MINUTES IN THE SUN DAILY.  - Vitamin D (25 hydroxy)  3. Urinary tract infection without hematuria, site unspecified  U/a positive for nitrites. Will check urine culture. She admits to recent treatment of UTI by Urology.   - POCT Urinalysis Dipstick (81002) - Urine Culture  4. Class 2 severe obesity due to excess calories with serious comorbidity and body mass index (BMI) of 39.0 to 39.9 in adult [facility])  She is encouraged to strive for BMI less than 32 to decrease cardiac risk. She is encouraged to slowly incorporate more exercise into her daily routine. She is to aim for 30 minutes of exercise five days per week.   Maximino Greenland, MD    THE PATIENT IS ENCOURAGED TO PRACTICE SOCIAL DISTANCING DUE TO THE COVID-19 PANDEMIC.

## 2019-04-06 ENCOUNTER — Other Ambulatory Visit: Payer: Self-pay | Admitting: Internal Medicine

## 2019-04-06 LAB — URINE CULTURE

## 2019-04-06 MED ORDER — NITROFURANTOIN MONOHYD MACRO 100 MG PO CAPS: 100.0000 mg | ORAL_CAPSULE | Freq: Two times a day (BID) | ORAL | 0 refills | Status: AC

## 2019-04-09 ENCOUNTER — Encounter: Payer: Self-pay | Admitting: Internal Medicine

## 2019-05-24 ENCOUNTER — Encounter: Payer: Self-pay | Admitting: Internal Medicine

## 2019-05-27 ENCOUNTER — Ambulatory Visit: Payer: 59 | Admitting: Internal Medicine

## 2019-05-27 ENCOUNTER — Other Ambulatory Visit: Payer: Self-pay

## 2019-05-27 VITALS — BP 138/88 | HR 97 | Temp 98.1°F | Ht 59.0 in | Wt 200.4 lb

## 2019-05-27 DIAGNOSIS — F419 Anxiety disorder, unspecified: Secondary | ICD-10-CM

## 2019-05-27 DIAGNOSIS — R635 Abnormal weight gain: Secondary | ICD-10-CM

## 2019-05-27 DIAGNOSIS — R030 Elevated blood-pressure reading, without diagnosis of hypertension: Secondary | ICD-10-CM | POA: Diagnosis not present

## 2019-05-27 DIAGNOSIS — Z6841 Body Mass Index (BMI) 40.0 and over, adult: Secondary | ICD-10-CM

## 2019-05-27 MED ORDER — CLONAZEPAM 0.5 MG PO TABS: ORAL_TABLET | ORAL | 0 refills | Status: DC

## 2019-05-27 NOTE — Patient Instructions (Signed)
Clonazepam tablets What is this medicine? CLONAZEPAM (kloe NA ze pam) is a benzodiazepine. It is used to treat certain types of seizures. It is also used to treat panic disorder. This medicine may be used for other purposes; ask your health care provider or pharmacist if you have questions. COMMON BRAND NAME(S): Ceberclon, Klonopin What should I tell my health care provider before I take this medicine? They need to know if you have any of these conditions:  an alcohol or drug abuse problem  bipolar disorder, depression, psychosis or other mental health condition  glaucoma  kidney or liver disease  lung or breathing disease  myasthenia gravis  Parkinson's disease  porphyria  seizures or a history of seizures  suicidal thoughts  an unusual or allergic reaction to clonazepam, other benzodiazepines, foods, dyes, or preservatives  pregnant or trying to get pregnant  breast-feeding How should I use this medicine? Take this medicine by mouth with a glass of water. Follow the directions on the prescription label. If it upsets your stomach, take it with food or milk. Take your medicine at regular intervals. Do not take it more often than directed. Do not stop taking or change the dose except on the advice of your doctor or health care professional. A special MedGuide will be given to you by the pharmacist with each prescription and refill. Be sure to read this information carefully each time. Talk to your pediatrician regarding the use of this medicine in children. Special care may be needed. Overdosage: If you think you have taken too much of this medicine contact a poison control center or emergency room at once. NOTE: This medicine is only for you. Do not share this medicine with others. What if I miss a dose? If you miss a dose, take it as soon as you can. If it is almost time for your next dose, take only that dose. Do not take double or extra doses. What may interact with this  medicine? Do not take this medication with any of the following medicines:  narcotic medicines for cough  sodium oxybate This medicine may also interact with the following medications:  alcohol  antihistamines for allergy, cough and cold  antiviral medicines for HIV or AIDS  certain medicines for anxiety or sleep  certain medicines for depression, like amitriptyline, fluoxetine, sertraline  certain medicines for fungal infections like ketoconazole and itraconazole  certain medicines for seizures like carbamazepine, phenobarbital, phenytoin, primidone  general anesthetics like halothane, isoflurane, methoxyflurane, propofol  local anesthetics like lidocaine, pramoxine, tetracaine  medicines that relax muscles for surgery  narcotic medicines for pain  phenothiazines like chlorpromazine, mesoridazine, prochlorperazine, thioridazine This list may not describe all possible interactions. Give your health care provider a list of all the medicines, herbs, non-prescription drugs, or dietary supplements you use. Also tell them if you smoke, drink alcohol, or use illegal drugs. Some items may interact with your medicine. What should I watch for while using this medicine? Tell your doctor or health care professional if your symptoms do not start to get better or if they get worse. Do not stop taking except on your doctor's advice. You may develop a severe reaction. Your doctor will tell you how much medicine to take. You may get drowsy or dizzy. Do not drive, use machinery, or do anything that needs mental alertness until you know how this medicine affects you. To reduce the risk of dizzy and fainting spells, do not stand or sit up quickly, especially if you are   an older patient. Alcohol may increase dizziness and drowsiness. Avoid alcoholic drinks. If you are taking another medicine that also causes drowsiness, you may have more side effects. Give your health care provider a list of all  medicines you use. Your doctor will tell you how much medicine to take. Do not take more medicine than directed. Call emergency for help if you have problems breathing or unusual sleepiness. The use of this medicine may increase the chance of suicidal thoughts or actions. Pay special attention to how you are responding while on this medicine. Any worsening of mood, or thoughts of suicide or dying should be reported to your health care professional right away. What side effects may I notice from receiving this medicine? Side effects that you should report to your doctor or health care professional as soon as possible:  allergic reactions like skin rash, itching or hives, swelling of the face, lips, or tongue  breathing problems  confusion  loss of balance or coordination  signs and symptoms of low blood pressure like dizziness; feeling faint or lightheaded, falls; unusually weak or tired  suicidal thoughts or mood changes Side effects that usually do not require medical attention (report to your doctor or health care professional if they continue or are bothersome):  dizziness  headache  tiredness  upset stomach This list may not describe all possible side effects. Call your doctor for medical advice about side effects. You may report side effects to FDA at 1-800-FDA-1088. Where should I keep my medicine? Keep out of the reach of children. This medicine can be abused. Keep your medicine in a safe place to protect it from theft. Do not share this medicine with anyone. Selling or giving away this medicine is dangerous and against the law. This medicine may cause accidental overdose and death if taken by other adults, children, or pets. Mix any unused medicine with a substance like cat litter or coffee grounds. Then throw the medicine away in a sealed container like a sealed bag or a coffee can with a lid. Do not use the medicine after the expiration date. Store at room temperature between  15 and 30 degrees C (59 and 86 degrees F). Protect from light. Keep container tightly closed. NOTE: This sheet is a summary. It may not cover all possible information. If you have questions about this medicine, talk to your doctor, pharmacist, or health care provider.  2020 Elsevier/Gold Standard (2015-07-10 18:46:32)  

## 2019-05-28 LAB — TSH: TSH: 0.48 u[IU]/mL (ref 0.450–4.500)

## 2019-05-30 LAB — SPECIMEN STATUS REPORT

## 2019-05-30 LAB — T4, FREE: Free T4: 1.19 ng/dL (ref 0.82–1.77)

## 2019-06-02 ENCOUNTER — Encounter: Payer: Self-pay | Admitting: Internal Medicine

## 2019-06-02 NOTE — Progress Notes (Signed)
This visit occurred during the SARS-CoV-2 public health emergency.  Safety protocols were in place, including screening questions prior to the visit, additional usage of staff PPE, and extensive cleaning of exam room while observing appropriate contact time as indicated for disinfecting solutions.  Subjective:     Patient ID: Olivia Paul , female    DOB: [birth year] , 64 y.o.   MRN: [id number]   Chief Complaint  Patient presents with  . Anxiety    when driving     HPI  She presents today for further evaluation of anxiety.  She reports that she has anxiety about driving. Symptoms occurred after brakes didn't work in her car. Her car has since been fixed. It now affects her all the time, even when driving other cars than her own. It is affecting her b/c she now has to drive to work, and it takes her much longer to drive to work now. She often drives under the speed limit.   Anxiety Presents for initial visit. Onset was 6 to 12 months ago. The problem has been gradually worsening. Symptoms include palpitations and panic. The severity of symptoms is moderate and interfering with daily activities.       Past Medical History:  Diagnosis Date  . Arthritis    lt knee  . Asthma 2011  . Complication of anesthesia   . Headache    sinus  . PONV (postoperative nausea and vomiting)   . Pre-diabetes   . Recurrent upper respiratory infection (URI)      Family History  Problem Relation Age of Onset  . Allergic rhinitis Mother   . Allergic rhinitis Father   . Asthma Father   . Urticaria Sister   . Breast cancer Maternal Aunt   . Breast cancer Paternal Aunt   . Breast cancer Paternal Grandmother   . Angioedema Neg Hx   . Atopy Neg Hx   . Eczema Neg Hx   . Immunodeficiency Neg Hx      Current Outpatient Medications:  .  albuterol (PROVENTIL HFA;VENTOLIN HFA) 108 (90 Base) MCG/ACT inhaler, Inhale 2 puffs into the lungs every 4 (four) hours as needed for wheezing or shortness of  breath., Disp: 18 g, Rfl: 1 .  albuterol (PROVENTIL) (2.5 MG/3ML) 0.083% nebulizer solution, Use 1 vial every 4-6 hours as needed for cough, wheeze, shortness of breath, or chest tightness, Disp: 180 mL, Rfl: 5 .  amitriptyline (ELAVIL) 25 MG tablet, Take 25-50 mg by mouth at bedtime., Disp: , Rfl:  .  Cholecalciferol (VITAMIN D) 50 MCG (2000 UT) CAPS, Take 2,000 Units by mouth daily., Disp: , Rfl:  .  EPINEPHrine 0.3 mg/0.3 mL IJ SOAJ injection, Inject 0.3 mLs (0.3 mg total) into the muscle as needed for anaphylaxis., Disp: 1 each, Rfl: 1 .  fexofenadine (ALLEGRA) 180 MG tablet, TAKE ONE TABLET ONCE DAILY FOR RUNNY NOSE OR ITCHING (Patient taking differently: Take 180 mg by mouth daily. ), Disp: 90 tablet, Rfl: 3 .  Fluticasone-Salmeterol 232-14 MCG/ACT AEPB, TAKE 2 PUFFS BY MOUTH TWICE A DAY, Disp: 1 each, Rfl: 2 .  hydroxypropyl methylcellulose / hypromellose (ISOPTO TEARS / GONIOVISC) 2.5 % ophthalmic solution, Place 1 drop into both eyes 3 (three) times daily as needed for dry eyes., Disp: , Rfl:  .  Krill Oil (OMEGA-3) 500 MG CAPS, Take 500 mg by mouth daily., Disp: , Rfl:  .  magnesium oxide (MAG-OX) 400 MG tablet, Take 400 mg by mouth daily., Disp: , Rfl:  .  Menthol-Methyl Salicylate (TIGER BALM LINIMENT EX), Apply 1 application topically at bedtime as needed (pain)., Disp: , Rfl:  .  montelukast (SINGULAIR) 10 MG tablet, TAKE 1 TABLET BY MOUTH EVERYDAY AT BEDTIME (Patient taking differently: Take 10 mg by mouth at bedtime. ), Disp: 30 tablet, Rfl: 5 .  tiZANidine (ZANAFLEX) 2 MG tablet, Take 1 tablet (2 mg total) by mouth every 8 (eight) hours as needed for muscle spasms., Disp: 40 tablet, Rfl: 0 .  traMADol (ULTRAM) 50 MG tablet, Take 50 mg by mouth every 8 (eight) hours as needed for moderate pain. , Disp: , Rfl:  .  vitamin E 400 UNIT capsule, Take 400 Units by mouth daily., Disp: , Rfl:  .  aspirin EC 325 MG tablet, Take 1 tablet (325 mg total) by mouth 2 (two) times daily after a meal.  Take x 1 month post op to decrease risk of blood clots. (Patient not taking: Reported on 04/03/2019), Disp: 60 tablet, Rfl: 0 .  clonazePAM (KLONOPIN) 0.5 MG tablet, One tab po bid prn, Disp: 60 tablet, Rfl: 0 .  docusate sodium (COLACE) 100 MG capsule, Take 1 capsule (100 mg total) by mouth 2 (two) times daily. (Patient not taking: Reported on 04/03/2019), Disp: 30 capsule, Rfl: 0 .  tolterodine (DETROL LA) 4 MG 24 hr capsule, Take 4 mg by mouth daily., Disp: , Rfl: 2   Allergies  Allergen Reactions  . Erythromycin Nausea And Vomiting  . Meloxicam     Bruised her legs all over  "Fixed drug eruption"  . Nsaids     Scarring and bruising of legs  . Sulfa Antibiotics     Mouth broke out in sores     Review of Systems  Constitutional: Negative.   Respiratory: Negative.   Cardiovascular: Positive for palpitations.  Gastrointestinal: Negative.   Neurological: Negative.   Psychiatric/Behavioral: Negative.      Today's Vitals   05/27/19 1519  BP: 138/88  Pulse: 97  Temp: 98.1 F (36.7 C)  TempSrc: Oral  Weight: 200 lb 6.4 oz (90.9 kg)  Height: 4\' 11"  (1.499 m)   Body mass index is 40.48 kg/m.   Wt Readings from Last 3 Encounters:  05/27/19 200 lb 6.4 oz (90.9 kg)  04/03/19 195 lb 6.4 oz (88.6 kg)  11/16/18 192 lb 9.6 oz (87.4 kg)     Objective:  Physical Exam Vitals and nursing note reviewed.  Constitutional:      Appearance: Normal appearance. She is obese.  HENT:     Head: Normocephalic and atraumatic.  Cardiovascular:     Rate and Rhythm: Normal rate and regular rhythm.     Heart sounds: Normal heart sounds.  Pulmonary:     Effort: Pulmonary effort is normal.     Breath sounds: Normal breath sounds.  Skin:    General: Skin is warm.  Neurological:     General: No focal deficit present.     Mental Status: She is alert.  Psychiatric:        Mood and Affect: Mood normal.        Behavior: Behavior normal.         Assessment And Plan:     1. Anxiety  She  was given rx clonazepam to use prn. I will also check her thyroid function. Also advised that counseling will likely help with this. She plans to contact [facility].   - TSH  2. Elevated blood pressure reading  Pt advised that optimal BP is less than 130/80.  Encouraged to avoid adding salt to her foods. Advised to avoid packaged/processed foods as much as possible.   3. Weight gain  She was made aware of five pound weight gain. She is encouraged to avoid processed foods and to increase her exercise regimen.    4. Class 3 severe obesity due to excess calories with serious comorbidity and body mass index (BMI) of 40.0 to 44.9 in adult (HCC)  BMI 40. She is encouraged to incorporate more exercise into her daily routine. She is advised to aim for 30 minutes five days per week.   Gwynneth Aliment, MD    THE PATIENT IS ENCOURAGED TO PRACTICE SOCIAL DISTANCING DUE TO THE COVID-19 PANDEMIC.

## 2019-06-07 ENCOUNTER — Other Ambulatory Visit: Payer: Self-pay | Admitting: Allergy

## 2019-06-07 DIAGNOSIS — J309 Allergic rhinitis, unspecified: Secondary | ICD-10-CM

## 2019-06-07 DIAGNOSIS — H1010 Acute atopic conjunctivitis, unspecified eye: Secondary | ICD-10-CM

## 2019-06-25 ENCOUNTER — Ambulatory Visit: Payer: 59 | Admitting: Internal Medicine

## 2019-06-25 ENCOUNTER — Encounter: Payer: Self-pay | Admitting: Internal Medicine

## 2019-06-25 ENCOUNTER — Other Ambulatory Visit: Payer: Self-pay

## 2019-06-25 VITALS — BP 130/78 | HR 86 | Temp 98.2°F | Ht 59.0 in | Wt 203.2 lb

## 2019-06-25 DIAGNOSIS — Z6841 Body Mass Index (BMI) 40.0 and over, adult: Secondary | ICD-10-CM

## 2019-06-25 DIAGNOSIS — R079 Chest pain, unspecified: Secondary | ICD-10-CM

## 2019-06-25 DIAGNOSIS — R9431 Abnormal electrocardiogram [ECG] [EKG]: Secondary | ICD-10-CM

## 2019-06-25 DIAGNOSIS — F419 Anxiety disorder, unspecified: Secondary | ICD-10-CM | POA: Diagnosis not present

## 2019-06-25 MED ORDER — CLONAZEPAM 0.5 MG PO TABS: ORAL_TABLET | ORAL | 1 refills | Status: DC

## 2019-06-26 NOTE — Progress Notes (Signed)
This visit occurred during the SARS-CoV-2 public health emergency.  Safety protocols were in place, including screening questions prior to the visit, additional usage of staff PPE, and extensive cleaning of exam room while observing appropriate contact time as indicated for disinfecting solutions.  Subjective:     Patient ID: Olivia Paul , female    DOB: 1955-11-19 , 64 y.o.   MRN: [id number]   Chief Complaint  Patient presents with  . Anxiety  . Chest Pain    HPI  She is here today for f/u anxiety. She was started on clonazepam at her last visit. She has not had any problems with the medication. She reports it has helped with her anxiety. However, one day she had right sided chest pain. She admits to drinking regular coffee that day, instead of decaf. This pain radiated through her breast, and was accompanied by nausea. She denies having shortness of breath and palpitations.     Past Medical History:  Diagnosis Date  . Arthritis    lt knee  . Asthma 2011  . Complication of anesthesia   . Headache    sinus  . PONV (postoperative nausea and vomiting)   . Pre-diabetes   . Recurrent upper respiratory infection (URI)      Family History  Problem Relation Age of Onset  . Allergic rhinitis Mother   . Allergic rhinitis Father   . Asthma Father   . Urticaria Sister   . Breast cancer Maternal Aunt   . Breast cancer Paternal Aunt   . Breast cancer Paternal Grandmother   . Angioedema Neg Hx   . Atopy Neg Hx   . Eczema Neg Hx   . Immunodeficiency Neg Hx      Current Outpatient Medications:  .  albuterol (PROVENTIL HFA;VENTOLIN HFA) 108 (90 Base) MCG/ACT inhaler, Inhale 2 puffs into the lungs every 4 (four) hours as needed for wheezing or shortness of breath., Disp: 18 g, Rfl: 1 .  albuterol (PROVENTIL) (2.5 MG/3ML) 0.083% nebulizer solution, Use 1 vial every 4-6 hours as needed for cough, wheeze, shortness of breath, or chest tightness, Disp: 180 mL, Rfl: 5 .  amitriptyline  (ELAVIL) 25 MG tablet, Take 25-50 mg by mouth at bedtime., Disp: , Rfl:  .  Cholecalciferol (VITAMIN D) 50 MCG (2000 UT) CAPS, Take 2,000 Units by mouth daily., Disp: , Rfl:  .  clonazePAM (KLONOPIN) 0.5 MG tablet, One tab po bid prn, Disp: 60 tablet, Rfl: 1 .  EPINEPHrine 0.3 mg/0.3 mL IJ SOAJ injection, Inject 0.3 mLs (0.3 mg total) into the muscle as needed for anaphylaxis., Disp: 1 each, Rfl: 1 .  fexofenadine (ALLEGRA) 180 MG tablet, TAKE ONE TABLET ONCE DAILY FOR RUNNY NOSE OR ITCHING (Patient taking differently: Take 180 mg by mouth daily. ), Disp: 90 tablet, Rfl: 3 .  Fluticasone-Salmeterol 232-14 MCG/ACT AEPB, TAKE 2 PUFFS BY MOUTH TWICE A DAY, Disp: 1 each, Rfl: 2 .  gabapentin (NEURONTIN) 300 MG capsule, daily. , Disp: , Rfl:  .  hydroxypropyl methylcellulose / hypromellose (ISOPTO TEARS / GONIOVISC) 2.5 % ophthalmic solution, Place 1 drop into both eyes 3 (three) times daily as needed for dry eyes., Disp: , Rfl:  .  Krill Oil (OMEGA-3) 500 MG CAPS, Take 500 mg by mouth daily., Disp: , Rfl:  .  magnesium oxide (MAG-OX) 400 MG tablet, Take 400 mg by mouth daily., Disp: , Rfl:  .  Menthol-Methyl Salicylate (TIGER BALM LINIMENT EX), Apply 1 application topically at bedtime as needed (pain).,  Disp: , Rfl:  .  montelukast (SINGULAIR) 10 MG tablet, TAKE 1 TABLET BY MOUTH EVERYDAY AT BEDTIME, Disp: 30 tablet, Rfl: 0 .  oxybutynin (DITROPAN-XL) 10 MG 24 hr tablet, Take 10 mg by mouth daily., Disp: , Rfl:  .  tiZANidine (ZANAFLEX) 4 MG tablet, Take 4 mg by mouth every 8 (eight) hours as needed., Disp: , Rfl:  .  traMADol (ULTRAM) 50 MG tablet, Take 50 mg by mouth every 8 (eight) hours as needed for moderate pain. , Disp: , Rfl:  .  vitamin E 400 UNIT capsule, Take 400 Units by mouth daily., Disp: , Rfl:    Allergies  Allergen Reactions  . Erythromycin Nausea And Vomiting  . Meloxicam     Bruised her legs all over  "Fixed drug eruption"  . Nsaids     Scarring and bruising of legs  . Sulfa  Antibiotics     Mouth broke out in sores     Review of Systems  Constitutional: Negative.   Respiratory: Negative.   Cardiovascular: Positive for chest pain.  Gastrointestinal: Negative.   Neurological: Negative.   Psychiatric/Behavioral: The patient is nervous/anxious.      Today's Vitals   06/25/19 1047  BP: 130/78  Pulse: 86  Temp: 98.2 F (36.8 C)  TempSrc: Oral  Weight: 203 lb 3.2 oz (92.2 kg)  Height: 4\' 11"  (1.499 m)   Body mass index is 41.04 kg/m.   Objective:  Physical Exam Vitals and nursing note reviewed.  Constitutional:      Appearance: Normal appearance. She is well-developed. She is obese.  HENT:     Head: Normocephalic and atraumatic.  Cardiovascular:     Rate and Rhythm: Normal rate and regular rhythm.     Heart sounds: Normal heart sounds.  Pulmonary:     Effort: Pulmonary effort is normal.     Breath sounds: Normal breath sounds.  Skin:    General: Skin is warm.  Neurological:     General: No focal deficit present.     Mental Status: She is alert.  Psychiatric:        Mood and Affect: Mood normal.        Behavior: Behavior normal.         Assessment And Plan:     1. Anxiety  Improving. She is encouraged to take clonazepam AS NEEDED ONLY. Pt advised to decrease to once daily as needed.   - clonazePAM (KLONOPIN) 0.5 MG tablet; One tab po bid prn  Dispense: 60 tablet; Refill: 1  2. Chest pain, unspecified type  Atypical. I will refer her to Cardiology for further evaluation and for echocardiogram. She is in agreement with her treatment plan. EKG performed, NSR w/o acute changes, RAE is present.   - ECHOCARDIOGRAM LIMITED; Future - Ambulatory referral to Cardiology - EKG 12-Lead  3. Abnormal EKG  I will refer her for echocardiogram to further evaluate possible RAE.   - ECHOCARDIOGRAM LIMITED; Future  4. Class 3 severe obesity due to excess calories with serious comorbidity and body mass index (BMI) of 45.0 to 49.9 in adult  [facility])  She is encouraged to initially strive for BMI less than 30 to decrease cardiac risk. Also advised to incorporate more exercise into her daily routine.   [facility], MD    THE PATIENT IS ENCOURAGED TO PRACTICE SOCIAL DISTANCING DUE TO THE COVID-19 PANDEMIC.

## 2019-07-04 ENCOUNTER — Encounter: Payer: Self-pay | Admitting: Cardiology

## 2019-07-04 ENCOUNTER — Ambulatory Visit: Payer: 59 | Admitting: Cardiology

## 2019-07-04 ENCOUNTER — Other Ambulatory Visit: Payer: Self-pay

## 2019-07-04 VITALS — BP 124/82 | HR 77 | Temp 97.1°F | Ht 59.0 in | Wt 204.6 lb

## 2019-07-04 DIAGNOSIS — R079 Chest pain, unspecified: Secondary | ICD-10-CM

## 2019-07-04 DIAGNOSIS — Z6841 Body Mass Index (BMI) 40.0 and over, adult: Secondary | ICD-10-CM | POA: Diagnosis not present

## 2019-07-04 DIAGNOSIS — Z7189 Other specified counseling: Secondary | ICD-10-CM | POA: Diagnosis not present

## 2019-07-04 NOTE — Progress Notes (Signed)
Cardiology Office Note:    Date:  07/04/2019   ID:  Olivia Paul, DOB [birth year], MRN [id number]  PCP:  Glendale Chard, MD  Cardiologist:  Buford Dresser, MD  Referring MD: Glendale Chard, MD   CC: new patient evaluation for chest pain.  History of Present Illness:    Olivia Paul is a 64 y.o. female with a hx of asthma, obesity who is seen as a new consult at the request of Glendale Chard, MD for the evaluation and management of chest pain.  Note from 06/25/19 with Dr. Baird Cancer reviewed. Noted one episode of right sided chest pain.  Chest pain: -Description: at work, at her desk on 06/24/19. Entire ride side of chest, from neck to stomach, was a sharp pain. Had nausea at the same time. Lasted about 10 minutes. Happened just before lunch. Second time this happened, first time was about 6 years ago, went to [facility] for eval. Has a stress test, told it was normal during that evaluation. -Associated symptoms: nausea. No shortness breath, lightheadedness/dizziness, diaphoresis. -Aggravating/alleviating factors: none that she can determine. She did note that she had coffee with caffeine that day, which she usually has decaf. -Prior cardiac history: Murmur in college, told it was fine. -Prior ECG: NSR -Prior workup: normal stress test ~6 years ago. Has pending echo tomorrow. -Prior treatment: none -Alcohol: none -Tobacco: never -Exercise level: still struggling to get back to her baseline after knee replacement. 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 -Cardiac ROS: no shortness of breath, no PND, no orthopnea, no LE edema, no syncope -Family history: mat gma had enlarged heart. Mother was on dialysis, had heart failure. Mat aunt had heart failure as well. Mat uncle just started on heart pills to keep his heart rate controlled. No MI or CVA that she is aware of. "Everyone" has high blood pressure.  Past Medical History:  Diagnosis Date  . Arthritis    lt knee  .  Asthma 2011  . Complication of anesthesia   . Headache    sinus  . PONV (postoperative nausea and vomiting)   . Pre-diabetes   . Recurrent upper respiratory infection (URI)     Past Surgical History:  Procedure Laterality Date  . BREAST BIOPSY    . BREAST EXCISIONAL BIOPSY    . KNEE ARTHROSCOPY WITH MENISCAL REPAIR Left 02/21/2018  . 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 albuterol (PROVENTIL HFA;VENTOLIN HFA) 108 (90 Base) MCG/ACT inhaler Inhale 2 puffs into the lungs every 4 (four) hours as needed for wheezing or shortness of breath.  Marland Kitchen albuterol (PROVENTIL) (2.5 MG/3ML) 0.083% nebulizer solution Use 1 vial every 4-6 hours as needed for cough, wheeze, shortness of breath, or chest tightness  . amitriptyline (ELAVIL) 25 MG tablet Take 25-50 mg by mouth at bedtime.  . Cholecalciferol (VITAMIN D) 50 MCG (2000 UT) CAPS Take 2,000 Units by mouth daily.  . clonazePAM (KLONOPIN) 0.5 MG tablet One tab po bid prn  . EPINEPHrine 0.3 mg/0.3 mL IJ SOAJ injection Inject 0.3 mLs (0.3 mg total) into the muscle as needed for anaphylaxis.  . fexofenadine (ALLEGRA) 180 MG tablet TAKE ONE TABLET ONCE DAILY FOR RUNNY NOSE OR ITCHING (Patient taking differently: Take 180 mg by mouth daily. )  . Fluticasone-Salmeterol 232-14  MCG/ACT AEPB TAKE 2 PUFFS BY MOUTH TWICE A DAY  . gabapentin (NEURONTIN) 300 MG capsule daily.   . hydroxypropyl methylcellulose / hypromellose (ISOPTO TEARS / GONIOVISC) 2.5 % ophthalmic solution Place 1 drop into both eyes 3 (three) times daily as needed for dry eyes.  Boris Lown Oil (OMEGA-3) 500 MG CAPS Take 500 mg by mouth daily.  . magnesium oxide (MAG-OX) 400 MG tablet Take 400 mg by mouth daily.  . Menthol-Methyl Salicylate (TIGER BALM LINIMENT EX) Apply 1  application topically at bedtime as needed (pain).  . montelukast (SINGULAIR) 10 MG tablet TAKE 1 TABLET BY MOUTH EVERYDAY AT BEDTIME  . oxybutynin (DITROPAN-XL) 10 MG 24 hr tablet Take 10 mg by mouth daily.  Marland Kitchen tiZANidine (ZANAFLEX) 4 MG tablet Take 4 mg by mouth every 8 (eight) hours as needed.  . traMADol (ULTRAM) 50 MG tablet Take 50 mg by mouth every 8 (eight) hours as needed for moderate pain.   . vitamin E 400 UNIT capsule Take 400 Units by mouth daily.   No current facility-administered medications on file prior to visit.     Allergies:   Erythromycin, Meloxicam, Nsaids, and Sulfa antibiotics   Social History   Tobacco Use  . Smoking status: Never Smoker  . Smokeless tobacco: Never Used  Substance Use Topics  . Alcohol use: No    Alcohol/week: 0.0 standard drinks  . Drug use: No    Family History: family history includes Allergic rhinitis in her father and mother; Asthma in her father; Breast cancer in her maternal aunt, paternal aunt, and paternal grandmother; Urticaria in her sister. There is no history of Angioedema, Atopy, Eczema, or Immunodeficiency.  ROS:   Please see the history of present illness.  Additional pertinent ROS: Constitutional: Negative for chills, fever, night sweats, unintentional weight loss  HENT: Negative for ear pain and hearing loss.   Eyes: Negative for loss of vision and eye pain.  Respiratory: Negative for cough, sputum, wheezing.   Cardiovascular: See HPI. Gastrointestinal: Negative for abdominal pain, melena, and hematochezia.  Genitourinary: Negative for dysuria and hematuria.  Musculoskeletal: Negative for falls and myalgias.  Skin: Negative for itching and rash.  Neurological: Negative for focal weakness, focal sensory changes and loss of consciousness.  Endo/Heme/Allergies: Does not bruise/bleed easily.     EKGs/Labs/Other Studies Reviewed:    The following studies were reviewed today: No prior cardiac studies  EKG:  EKG is  personally reviewed.  The ekg ordered today demonstrates NSR at 77 bpm with PRWP  Recent Labs: 04/03/2019: ALT 15; BUN 13; Creatinine, Ser 0.61; Hemoglobin 13.4; Platelets 435; Potassium 4.1; Sodium 140 05/27/2019: TSH 0.480  Recent Lipid Panel    Component Value Date/Time   CHOL 172 04/03/2019 1541   TRIG 48 04/03/2019 1541   HDL 56 04/03/2019 1541   CHOLHDL 3.1 04/03/2019 1541   LDLCALC 106 (H) 04/03/2019 1541    Physical Exam:    VS:  BP 124/82   Pulse 77   Temp (!) 97.1 F (36.2 C)   Ht 4\' 11"  (1.499 m)   Wt 204 lb 9.6 oz (92.8 kg)   SpO2 95%   BMI 41.32 kg/m     Wt Readings from Last 3 Encounters:  07/04/19 204 lb 9.6 oz (92.8 kg)  06/25/19 203 lb 3.2 oz (92.2 kg)  05/27/19 200 lb 6.4 oz (90.9 kg)    GEN: Well nourished, well developed in no acute distress HEENT: Normal, moist mucous membranes NECK: No JVD CARDIAC: regular rhythm,  normal S1 and S2, no rubs or gallops. No murmurs. VASCULAR: Radial and DP pulses 2+ bilaterally. No carotid bruits RESPIRATORY:  Clear to auscultation without rales, wheezing or rhonchi  ABDOMEN: Soft, non-tender, non-distended MUSCULOSKELETAL:  Ambulates independently SKIN: Warm and dry, no edema NEUROLOGIC:  Alert and oriented x 3. No focal neuro deficits noted. PSYCHIATRIC:  Normal affect    ASSESSMENT:    1. Chest pain, unspecified type   2. Class 3 severe obesity due to excess calories without serious comorbidity with body mass index (BMI) of 40.0 to 44.9 in adult [facility])   3. Cardiac risk counseling   4. Counseling on health promotion and disease prevention    PLAN:    Chest pain: atypical symptoms (right sided, sharp, nonexertional) -reports prior stress test normal, though I cannot see records -has an echo scheduled for tomorrow, then CT if abnormal. -if echo unremarkable, high suspicion for noncardiac cause of chest pain  Obesity: BMI 41 -working on lifestyle, struggling to get back to baseline after knee  replacement -would benefit from weight loss  Cardiac risk counseling and prevention recommendations: -recommend heart healthy/Mediterranean diet, with whole grains, fruits, vegetable, fish, lean meats, nuts, and olive oil. Limit salt. -recommend moderate walking, 3-5 times/week for 30-50 minutes each session. Aim for at least 150 minutes.week. Goal should be pace of 3 miles/hours, or walking 1.5 miles in 30 minutes -recommend avoidance of tobacco products. Avoid excess alcohol. -ASCVD risk score: The 10-year ASCVD risk score Denman George DC Montez Hageman., et al., 2013) is: 6%   Values used to calculate the score:     Age: 13 years     Sex: Female     Is Non-Hispanic African American: Yes     Diabetic: No     Tobacco smoker: No     Systolic Blood Pressure: 130 mmHg     Is BP treated: No     HDL Cholesterol: 56 mg/dL     Total Cholesterol: 172 mg/dL    Plan for follow up: as needed if testing unremarkable  Jodelle Red, MD, PhD [place]  [facility]    Medication Adjustments/Labs and Tests Ordered: Current medicines are reviewed at length with the patient today.  Concerns regarding medicines are outlined above.  Orders Placed This Encounter  Procedures  . EKG 12-Lead   No orders of the defined types were placed in this encounter.   Patient Instructions  Medication Instructions:  Your Physician recommend you continue on your current medication as directed.    *If you need a refill on your cardiac medications before your next appointment, please call your pharmacy*   Lab Work: None  Testing/Procedures: None   Follow-Up: At [facility], you and your health needs are our priority.  As part of our continuing mission to provide you with exceptional heart care, we have created designated Provider Care Teams.  These Care Teams include your primary Cardiologist (physician) and Advanced Practice Providers (APPs -  Physician Assistants and Nurse Practitioners) who all work  together to provide you with the care you need, when you need it.  We recommend signing up for the patient portal called "MyChart".  Sign up information is provided on this After Visit Summary.  MyChart is used to connect with patients for Virtual Visits (Telemedicine).  Patients are able to view lab/test results, encounter notes, upcoming appointments, etc.  Non-urgent messages can be sent to your provider as well.   To learn more about what you can do with MyChart, go  to [email or web address].    Your next appointment:   As needed  The format for your next appointment:   Either In Person or Virtual  Provider:   Jodelle Red, MD      Signed, Jodelle Red, MD PhD 07/04/2019  [facility]

## 2019-07-04 NOTE — Patient Instructions (Signed)
Medication Instructions:  Your Physician recommend you continue on your current medication as directed.    *If you need a refill on your cardiac medications before your next appointment, please call your pharmacy*   Lab Work: None   Testing/Procedures: None   Follow-Up: At [facility], you and your health needs are our priority.  As part of our continuing mission to provide you with exceptional heart care, we have created designated Provider Care Teams.  These Care Teams include your primary Cardiologist (physician) and Advanced Practice Providers (APPs -  Physician Assistants and Nurse Practitioners) who all work together to provide you with the care you need, when you need it.  We recommend signing up for the patient portal called "MyChart".  Sign up information is provided on this After Visit Summary.  MyChart is used to connect with patients for Virtual Visits (Telemedicine).  Patients are able to view lab/test results, encounter notes, upcoming appointments, etc.  Non-urgent messages can be sent to your provider as well.   To learn more about what you can do with MyChart, go to [email or web address].    Your next appointment:   As needed  The format for your next appointment:   Either In Person or Virtual  Provider:   Bridgette Christopher, MD   

## 2019-07-05 ENCOUNTER — Ambulatory Visit (HOSPITAL_COMMUNITY)
Admission: RE | Admit: 2019-07-05 | Discharge: 2019-07-05 | Disposition: A | Discharge status: Home / Self Care | Payer: 59 | Source: Ambulatory Visit | Attending: Internal Medicine | Admitting: Internal Medicine

## 2019-07-05 ENCOUNTER — Encounter: Payer: Self-pay | Admitting: Internal Medicine

## 2019-07-05 ENCOUNTER — Other Ambulatory Visit: Payer: Self-pay | Admitting: Allergy

## 2019-07-05 DIAGNOSIS — R079 Chest pain, unspecified: Secondary | ICD-10-CM | POA: Diagnosis not present

## 2019-07-05 DIAGNOSIS — J309 Allergic rhinitis, unspecified: Secondary | ICD-10-CM

## 2019-07-05 DIAGNOSIS — R9431 Abnormal electrocardiogram [ECG] [EKG]: Secondary | ICD-10-CM

## 2019-07-05 NOTE — Progress Notes (Signed)
  Echocardiogram 2D Echocardiogram has been performed.  Olivia Paul 07/05/2019, 3:53 PM

## 2019-07-08 ENCOUNTER — Other Ambulatory Visit (HOSPITAL_BASED_OUTPATIENT_CLINIC_OR_DEPARTMENT_OTHER): Payer: 59

## 2019-07-12 ENCOUNTER — Other Ambulatory Visit: Payer: Self-pay | Admitting: Family Medicine

## 2019-08-15 ENCOUNTER — Other Ambulatory Visit: Payer: Self-pay | Admitting: Allergy

## 2019-08-15 DIAGNOSIS — J309 Allergic rhinitis, unspecified: Secondary | ICD-10-CM

## 2019-09-09 ENCOUNTER — Telehealth: Payer: Self-pay | Admitting: Allergy

## 2019-09-09 NOTE — Telephone Encounter (Signed)
Patient called to schedule an appointment for refills on her Singulair. Patient states she has been out for a month and wants to know if she can get a refill before her appointment on 10/17/2019 at 3:30pm.   Please advise.

## 2019-09-09 NOTE — Telephone Encounter (Signed)
Patient was notified that courtesy refill was sent in back in April. We can't send another refill in until she is seen. I offer her the option to see another provider for a sooner appointment. Patient refused.

## 2019-10-02 ENCOUNTER — Other Ambulatory Visit: Payer: Self-pay

## 2019-10-02 ENCOUNTER — Encounter: Payer: Self-pay | Admitting: Internal Medicine

## 2019-10-02 ENCOUNTER — Ambulatory Visit: Payer: 59 | Admitting: Internal Medicine

## 2019-10-02 VITALS — BP 130/74 | HR 70 | Temp 98.1°F | Ht 59.0 in | Wt 209.0 lb

## 2019-10-02 DIAGNOSIS — Z6841 Body Mass Index (BMI) 40.0 and over, adult: Secondary | ICD-10-CM

## 2019-10-02 DIAGNOSIS — F419 Anxiety disorder, unspecified: Secondary | ICD-10-CM

## 2019-10-02 MED ORDER — CLONAZEPAM 0.5 MG PO TABS: ORAL_TABLET | ORAL | 1 refills | Status: DC

## 2019-10-02 NOTE — Progress Notes (Signed)
I,Tianna Badgett,acting as a Neurosurgeon for Gwynneth Aliment, MD.,have documented all relevant documentation on the behalf of Gwynneth Aliment, MD,as directed by  Gwynneth Aliment, MD while in the presence of Gwynneth Aliment, MD.  This visit occurred during the SARS-CoV-2 public health emergency.  Safety protocols were in place, including screening questions prior to the visit, additional usage of staff PPE, and extensive cleaning of exam room while observing appropriate contact time as indicated for disinfecting solutions.  Subjective:     Patient ID: Olivia Paul , female    DOB: 1955-05-01 , 64 y.o.   MRN: [id number]   Chief Complaint  Patient presents with  . Anxiety    HPI  She is here today for f/u anxiety. She has been taking clonazepam.  She has not had any problems with the medication. She reports it has helped with her anxiety. She is now drinking decaf coffee. She denies having shortness of breath and palpitations.   Anxiety Presents for follow-up visit. Symptoms include nervous/anxious behavior and panic. Patient reports no compulsions, decreased concentration, depressed mood, feeling of choking or muscle tension.       Past Medical History:  Diagnosis Date  . Arthritis    lt knee  . Asthma 2011  . Complication of anesthesia   . Headache    sinus  . PONV (postoperative nausea and vomiting)   . Pre-diabetes   . Recurrent upper respiratory infection (URI)      Family History  Problem Relation Age of Onset  . Allergic rhinitis Mother   . Allergic rhinitis Father   . Asthma Father   . Urticaria Sister   . Breast cancer Maternal Aunt   . Breast cancer Paternal Aunt   . Breast cancer Paternal Grandmother   . Angioedema Neg Hx   . Atopy Neg Hx   . Eczema Neg Hx   . Immunodeficiency Neg Hx      Current Outpatient Medications:  .  albuterol (PROVENTIL HFA;VENTOLIN HFA) 108 (90 Base) MCG/ACT inhaler, Inhale 2 puffs into the lungs every 4 (four) hours as needed for  wheezing or shortness of breath., Disp: 18 g, Rfl: 1 .  albuterol (PROVENTIL) (2.5 MG/3ML) 0.083% nebulizer solution, USE 1 VIAL EVERY 4-6 HOURS AS NEEDED FOR COUGH, WHEEZE, SHORTNESS OF BREATH, OR CHEST TIGHTNESS, Disp: 150 mL, Rfl: 0 .  amitriptyline (ELAVIL) 25 MG tablet, Take 25-50 mg by mouth at bedtime., Disp: , Rfl:  .  aspirin 81 MG EC tablet, Take 81 mg by mouth daily. ! Tablet daily, Disp: , Rfl:  .  Cholecalciferol (VITAMIN D) 50 MCG (2000 UT) CAPS, Take 5,000 Units by mouth daily. , Disp: , Rfl:  .  clonazePAM (KLONOPIN) 0.5 MG tablet, One tab po bid prn, Disp: 60 tablet, Rfl: 1 .  EPINEPHrine 0.3 mg/0.3 mL IJ SOAJ injection, Inject 0.3 mLs (0.3 mg total) into the muscle as needed for anaphylaxis., Disp: 1 each, Rfl: 1 .  fexofenadine (ALLEGRA) 180 MG tablet, TAKE ONE TABLET ONCE DAILY FOR RUNNY NOSE OR ITCHING (Patient taking differently: Take 180 mg by mouth daily. ), Disp: 90 tablet, Rfl: 3 .  Fluticasone-Salmeterol 232-14 MCG/ACT AEPB, TAKE 2 PUFFS BY MOUTH TWICE A DAY, Disp: 1 each, Rfl: 2 .  gabapentin (NEURONTIN) 300 MG capsule, daily. , Disp: , Rfl:  .  hydroxypropyl methylcellulose / hypromellose (ISOPTO TEARS / GONIOVISC) 2.5 % ophthalmic solution, Place 1 drop into both eyes 3 (three) times daily as needed for dry eyes.,  Disp: , Rfl:  .  Krill Oil (OMEGA-3) 500 MG CAPS, Take 500 mg by mouth daily., Disp: , Rfl:  .  magnesium oxide (MAG-OX) 400 MG tablet, Take 400 mg by mouth daily., Disp: , Rfl:  .  Menthol-Methyl Salicylate (TIGER BALM LINIMENT EX), Apply 1 application topically at bedtime as needed (pain)., Disp: , Rfl:  .  montelukast (SINGULAIR) 10 MG tablet, TAKE 1 TABLET BY MOUTH EVERYDAY AT BEDTIME, Disp: 30 tablet, Rfl: 0 .  oxybutynin (DITROPAN-XL) 10 MG 24 hr tablet, Take 10 mg by mouth daily., Disp: , Rfl:  .  tiZANidine (ZANAFLEX) 4 MG tablet, Take 4 mg by mouth every 8 (eight) hours as needed., Disp: , Rfl:  .  traMADol (ULTRAM) 50 MG tablet, Take 50 mg by mouth  every 8 (eight) hours as needed for moderate pain. , Disp: , Rfl:  .  vitamin E 400 UNIT capsule, Take 400 Units by mouth daily., Disp: , Rfl:    Allergies  Allergen Reactions  . Erythromycin Nausea And Vomiting  . Meloxicam     Bruised her legs all over  "Fixed drug eruption"  . Nsaids     Scarring and bruising of legs  . Sulfa Antibiotics     Mouth broke out in sores     Review of Systems  Constitutional: Negative.   Respiratory: Negative.   Cardiovascular: Negative.   Gastrointestinal: Negative.   Neurological: Negative.   Psychiatric/Behavioral: Negative for decreased concentration. The patient is nervous/anxious.      Today's Vitals   10/02/19 1047  BP: 130/74  Pulse: 70  Temp: 98.1 F (36.7 C)  TempSrc: Oral  Weight: 209 lb (94.8 kg)  Height: 4\' 11"  (1.499 m)   Body mass index is 42.21 kg/m.   Objective:  Physical Exam Vitals and nursing note reviewed.  Constitutional:      Appearance: Normal appearance. She is obese.  HENT:     Head: Normocephalic and atraumatic.  Cardiovascular:     Rate and Rhythm: Normal rate and regular rhythm.     Heart sounds: Normal heart sounds.  Pulmonary:     Effort: Pulmonary effort is normal.     Breath sounds: Normal breath sounds.  Skin:    General: Skin is warm.  Neurological:     General: No focal deficit present.     Mental Status: She is alert.  Psychiatric:        Mood and Affect: Mood normal.        Behavior: Behavior normal.         Assessment And Plan:     1. Anxiety Comments: Chronic. She feels she has had some improvement, she will continue with clonazepam for now. She declines to start SSRI therapy. Encouraged to c/w counseling.  - clonazePAM (KLONOPIN) 0.5 MG tablet; One tab po bid prn  Dispense: 60 tablet; Refill: 1  2. Class 3 severe obesity due to excess calories with serious comorbidity and body mass index (BMI) of 40.0 to 44.9 in adult [facility]) She is encouraged to initially strive for BMI less  than 35 to decrease cardiac risk. Advised to aim for at least 150 minutes of exercise per week. Wt Readings from Last 3 Encounters:  10/02/19 209 lb (94.8 kg)  07/04/19 204 lb 9.6 oz (92.8 kg)  06/25/19 203 lb 3.2 oz (92.2 kg)     Patient was given opportunity to ask questions. Patient verbalized understanding of the plan and was able to repeat key elements of the  plan. All questions were answered to their satisfaction.  Gwynneth Aliment, MD   I, Gwynneth Aliment, MD, have reviewed all documentation for this visit. The documentation on 10/13/19 for the exam, diagnosis, procedures, and orders are all accurate and complete.  THE PATIENT IS ENCOURAGED TO PRACTICE SOCIAL DISTANCING DUE TO THE COVID-19 PANDEMIC.

## 2019-10-02 NOTE — Patient Instructions (Addendum)
Managing Anxiety, Adult After being diagnosed with an anxiety disorder, you may be relieved to know why you have felt or behaved a certain way. You may also feel overwhelmed about the treatment ahead and what it will mean for your life. With care and support, you can manage this condition and recover from it. How to manage lifestyle changes Managing stress and anxiety  Stress is your body's reaction to life changes and events, both good and bad. Most stress will last just a few hours, but stress can be ongoing and can lead to more than just stress. Although stress can play a major role in anxiety, it is not the same as anxiety. Stress is usually caused by something external, such as a deadline, test, or competition. Stress normally passes after the triggering event has ended.  Anxiety is caused by something internal, such as imagining a terrible outcome or worrying that something will go wrong that will devastate you. Anxiety often does not go away even after the triggering event is over, and it can become long-term (chronic) worry. It is important to understand the differences between stress and anxiety and to manage your stress effectively so that it does not lead to an anxious response. Talk with your health care provider or a counselor to learn more about reducing anxiety and stress. He or she may suggest tension reduction techniques, such as:  Music therapy. This can include creating or listening to music that you enjoy and that inspires you.  Mindfulness-based meditation. This involves being aware of your normal breaths while not trying to control your breathing. It can be done while sitting or walking.  Centering prayer. This involves focusing on a word, phrase, or sacred image that means something to you and brings you peace.  Deep breathing. To do this, expand your stomach and inhale slowly through your nose. Hold your breath for 3-5 seconds. Then exhale slowly, letting your stomach muscles  relax.  Self-talk. This involves identifying thought patterns that lead to anxiety reactions and changing those patterns.  Muscle relaxation. This involves tensing muscles and then relaxing them. Choose a tension reduction technique that suits your lifestyle and personality. These techniques take time and practice. Set aside 5-15 minutes a day to do them. Therapists can offer counseling and training in these techniques. The training to help with anxiety may be covered by some insurance plans. Other things you can do to manage stress and anxiety include:  Keeping a stress/anxiety diary. This can help you learn what triggers your reaction and then learn ways to manage your response.  Thinking about how you react to certain situations. You may not be able to control everything, but you can control your response.  Making time for activities that help you relax and not feeling guilty about spending your time in this way.  Visual imagery and yoga can help you stay calm and relax.  Medicines Medicines can help ease symptoms. Medicines for anxiety include:  Anti-anxiety drugs.  Antidepressants. Medicines are often used as a primary treatment for anxiety disorder. Medicines will be prescribed by a health care provider. When used together, medicines, psychotherapy, and tension reduction techniques may be the most effective treatment. Relationships Relationships can play a big part in helping you recover. Try to spend more time connecting with trusted friends and family members. Consider going to couples counseling, taking family education classes, or going to family therapy. Therapy can help you and others better understand your condition. How to recognize changes in your   anxiety Everyone responds differently to treatment for anxiety. Recovery from anxiety happens when symptoms decrease and stop interfering with your daily activities at home or work. This may mean that you will start to:  Have  better concentration and focus. Worry will interfere less in your daily thinking.  Sleep better.  Be less irritable.  Have more energy.  Have improved memory. It is important to recognize when your condition is getting worse. Contact your health care provider if your symptoms interfere with home or work and you feel like your condition is not improving. Follow these instructions at home: Activity  Exercise. Most adults should do the following: ? Exercise for at least 150 minutes each week. The exercise should increase your heart rate and make you sweat (moderate-intensity exercise). ? Strengthening exercises at least twice a week.  Get the right amount and quality of sleep. Most adults need 7-9 hours of sleep each night. Lifestyle   Eat a healthy diet that includes plenty of vegetables, fruits, whole grains, low-fat dairy products, and lean protein. Do not eat a lot of foods that are high in solid fats, added sugars, or salt.  Make choices that simplify your life.  Do not use any products that contain nicotine or tobacco, such as cigarettes, e-cigarettes, and chewing tobacco. If you need help quitting, ask your health care provider.  Avoid caffeine, alcohol, and certain over-the-counter cold medicines. These may make you feel worse. Ask your pharmacist which medicines to avoid. General instructions  Take over-the-counter and prescription medicines only as told by your health care provider.  Keep all follow-up visits as told by your health care provider. This is important. Where to find support You can get help and support from these sources:  Self-help groups.  Online and community organizations.  A trusted spiritual leader.  Couples counseling.  Family education classes.  Family therapy. Where to find more information You may find that joining a support group helps you deal with your anxiety. The following sources can help you locate counselors or support groups near  you:  Mental Health America: www.mentalhealthamerica.net  Anxiety and Depression Association of America (ADAA): www.adaa.org  National Alliance on Mental Illness (NAMI): www.nami.org Contact a health care provider if you:  Have a hard time staying focused or finishing daily tasks.  Spend many hours a day feeling worried about everyday life.  Become exhausted by worry.  Start to have headaches, feel tense, or have nausea.  Urinate more than normal.  Have diarrhea. Get help right away if you have:  A racing heart and shortness of breath.  Thoughts of hurting yourself or others. If you ever feel like you may hurt yourself or others, or have thoughts about taking your own life, get help right away. You can go to your nearest emergency department or call:  Your local emergency services (911 in the [place]  A suicide crisis helpline, such as the National Suicide Prevention Lifeline at 1-800-273-8255. This is open 24 hours a day. Summary  Taking steps to learn and use tension reduction techniques can help calm you and help prevent triggering an anxiety reaction.  When used together, medicines, psychotherapy, and tension reduction techniques may be the most effective treatment.  Family, friends, and partners can play a big part in helping you recover from an anxiety disorder. This information is not intended to replace advice given to you by your health care provider. Make sure you discuss any questions you have with your health care provider. Document Revised:   07/03/2018 Document Reviewed: 07/03/2018 Elsevier Patient Education  2020 Elsevier Inc.  Sertraline oral solution What is this medicine? COMMON BRAND NAME(S): Zoloft What should I tell my health care provider before I take this medicine? They need to know if you have any of these conditions:  bleeding disorders  bipolar disorder or a family history of bipolar disorder  glaucoma  heart disease  high blood  pressure  history of irregular heartbeat  history of low levels of calcium, magnesium, or potassium in the blood  if you often drink alcohol  liver disease  receiving electroconvulsive therapy  seizures  suicidal thoughts, plans, or attempt; a previous suicide attempt by you or a family member  take medicines that treat or prevent blood clots  thyroid disease  an unusual or allergic reaction to sertraline, other medicines, foods, dyes, or preservatives  pregnant or trying to get pregnant  breast-feeding How should I use this medicine? Take this medicine by mouth. Follow the directions on the prescription label. Before taking your dose, you need to dilute the solution in a beverage. Measure your medicine dose using the dropper in the bottle. Next, place the measured dose in at least 4 ounces (one-half cup) of water, ginger-ale, lemon-lime soda, lemonade or orange juice and mix. Do not mix in grapefruit juice or in any other liquids other than those listed. Drink all of mixed liquid immediately. Do not mix the dose and store it for later. It must be taken right away. You may take this medicine with or without food. Take your medicine at regular intervals. Do not take your medicine more often than directed. Do not stop taking this medicine suddenly except upon the advice of your doctor. Stopping this medicine too quickly may cause serious side effects or your condition may worsen. A special MedGuide will be given to you by the pharmacist with each prescription and refill. Be sure to read this information carefully each time. Talk to your pediatrician regarding the use of this medicine in children. While this drug may be prescribed for children as young as 7 years for selected conditions, precautions do apply. Overdosage: If you think you have taken too much of this medicine contact a poison control center or emergency room at once. NOTE: This medicine is only for you. Do not share this  medicine with others. What if I miss a dose? If you miss a dose, take it as soon as you can. If it is almost time for your next dose, take only that dose. Do not take double or extra doses. What may interact with this medicine? Do not take this medicine with any of the following medications:  cisapride  dronedarone  linezolid  MAOIs like Carbex, Eldepryl, Marplan, Nardil, and Parnate  methylene blue (injected into a vein)  pimozide  thioridazine This medicine may also interact with the following medications:  alcohol  amphetamines  aspirin and aspirin-like medicines  certain medicines for depression, anxiety, or psychotic disturbances  certain medicines for fungal infections like ketoconazole, fluconazole, posaconazole, and itraconazole  certain medicines for irregular heart beat like flecainide, quinidine, propafenone  certain medicines for migraine headaches like almotriptan, eletriptan, frovatriptan, naratriptan, rizatriptan, sumatriptan, zolmitriptan  certain medicines for sleep  certain medicines for seizures like carbamazepine, valproic acid, phenytoin  certain medicines that treat or prevent blood clots like warfarin, enoxaparin, dalteparin  cimetidine  digoxin  diuretics  fentanyl  isoniazid  lithium  NSAIDs, medicines for pain and inflammation, like ibuprofen or naproxen  other medicines  that prolong the QT interval (cause an abnormal heart rhythm) like dofetilide  rasagiline  safinamide  supplements like St. John's wort, kava kava, valerian  tolbutamide  tramadol  tryptophan This list may not describe all possible interactions. Give your health care provider a list of all the medicines, herbs, non-prescription drugs, or dietary supplements you use. Also tell them if you smoke, drink alcohol, or use illegal drugs. Some items may interact with your medicine. What should I watch for while using this medicine? Tell your doctor if your  symptoms do not get better or if they get worse. Visit your doctor or health care professional for regular checks on your progress. Because it may take several weeks to see the full effects of this medicine, it is important to continue your treatment as prescribed by your doctor. Patients and their families should watch out for new or worsening thoughts of suicide or depression. Also watch out for sudden changes in feelings such as feeling anxious, agitated, panicky, irritable, hostile, aggressive, impulsive, severely restless, overly excited and hyperactive, or not being able to sleep. If this happens, especially at the beginning of treatment or after a change in dose, call your health care professional. Olivia Paul may get drowsy or dizzy. Do not drive, use machinery, or do anything that needs mental alertness until you know how this medicine affects you. Do not stand or sit up quickly, especially if you are an older patient. This reduces the risk of dizzy or fainting spells. Alcohol may interfere with the effect of this medicine. Avoid alcoholic drinks. This medicine contains a high percentage of alcohol that may interact with medicines used to treat alcohol abuse, like Antabuse (disulfiram). You should not take these medicines together. Your mouth may get dry. Chewing sugarless gum or sucking hard candy, and drinking plenty of water may help. Contact your doctor if the problem does not go away or is severe. Some products may contain alcohol. Ask your pharmacist or healthcare provider if this medicine contains alcohol. Be sure to tell all healthcare providers you are taking this medicine. Certain medicines, like metronidazole and disulfiram, can cause an unpleasant reaction when taken with alcohol. The reaction includes flushing, headache, nausea, vomiting, sweating, and increased thirst. The reaction can last from 30 minutes to several hours. What side effects may I notice from receiving this medicine? Side  effects that you should report to your doctor or health care professional as soon as possible:  allergic reactions like skin rash, itching or hives, swelling of the face, lips, or tongue  anxious  black, tarry stools  changes in vision  confusion  elevated mood, decreased need for sleep, racing thoughts, impulsive behavior  eye pain  fast, irregular heartbeat  feeling faint or lightheaded, falls  feeling agitated, angry, or irritable  hallucination, loss of contact with reality  loss of balance or coordination  loss of memory  painful or prolonged erections  restlessness, pacing, inability to keep still  seizures  stiff muscles  suicidal thoughts or other mood changes  trouble sleeping  unusual bleeding or bruising  unusually weak or tired  vomiting Side effects that usually do not require medical attention (report to your doctor or health care professional if they continue or are bothersome):  change in appetite or weight  change in sex drive or performance  diarrhea  increased sweating  indigestion, nausea  tremors This list may not describe all possible side effects. Call your doctor for medical advice about side effects. You may  report side effects to FDA at 1-800-FDA-1088. Where should I keep my medicine? Keep out of the reach of children. Store at room temperature between 15 and 30 degrees C (59 and 86 degrees F). Throw away any unused medicine after the expiration date. NOTE: This sheet is a summary. It may not cover all possible information. If you have questions about this medicine, talk to your doctor, pharmacist, or health care provider.  2020 Elsevier/Gold Standard (2018-01-23 10:07:27)

## 2019-10-17 ENCOUNTER — Ambulatory Visit: Payer: 59 | Admitting: Allergy

## 2019-10-17 ENCOUNTER — Encounter: Payer: Self-pay | Admitting: Allergy

## 2019-10-17 ENCOUNTER — Other Ambulatory Visit: Payer: Self-pay

## 2019-10-17 VITALS — BP 116/78 | HR 102 | Temp 98.3°F | Resp 16 | Ht 59.0 in | Wt 209.6 lb

## 2019-10-17 DIAGNOSIS — J4540 Moderate persistent asthma, uncomplicated: Secondary | ICD-10-CM | POA: Diagnosis not present

## 2019-10-17 DIAGNOSIS — J3089 Other allergic rhinitis: Secondary | ICD-10-CM | POA: Diagnosis not present

## 2019-10-17 DIAGNOSIS — H1013 Acute atopic conjunctivitis, bilateral: Secondary | ICD-10-CM | POA: Diagnosis not present

## 2019-10-17 NOTE — Patient Instructions (Signed)
Asthma - will have you try Breo 1 puff once a day.  This replaces your current inhaler and does appear to be covered by insurance.   - albuterol as needed 2 puffs every 4-6 hours for cough, wheeze, chest tightness, difficulty breathing - continue singulair 10mg  at bedtime daily - your lung function testing today looks great! Asthma control goals:   Full participation in all desired activities (may need albuterol before activity)  Albuterol use two time or less a week on average (not counting use with activity)  Cough interfering with sleep two time or less a month  Oral steroids no more than once a year  No hospitalizations  Allergies - continue singulair as above - can use Allegra 180mg  daily as needed - for nasal congestion may use nasal fluticasone (Flonase) 1-2 sprays each nostril daily for 1-2 weeks at a time if needed   Follow-up in  4-6 months or sooner if needed

## 2019-10-17 NOTE — Progress Notes (Signed)
Follow-up Note  RE: Olivia Paul MRN: [id number] DOB: 25-Aug-1955 Date of Office Visit: 10/17/2019   History of present illness: Olivia Paul is a 64 y.o. female presenting today for follow-up of asthma, allergic rhinitis with conjunctivitis.  She was last seen in the office on 09/20/2018 by myself.  She states she has been doing well over the past year.  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  She states that the orthopedist did perform a 24-hour patch test to a piece of the joint prior to the replacement.  This did not show any sensitivities. In regards to her asthma she states she has been doing well.  She likes the air duo device that she currently has and likes the powder formulation.  She states it has been working well to maintain her control.  She does not necessarily use it every day but will use when she is feeling a little bit more symptomatic.  She states however that the insurance at this time is not covering as much as it previously did and the price has gone up for her.  She would like to know if there is an alternative she does take Singulair daily however has been out for the past 2 months or so.  Despite that she has not noted any increase in albuterol needs.  She states on average she may use her albuterol or the nebulizer 1-2 times a month.  This was the case for the month of August.  She has not required any ED or urgent care visits for her asthma or any systemic steroids since last visit. Regards to her allergies these to have been doing okay.  She has not needed to use her nasal spray.  She will use her Allegra only as needed. She is fully vaccinated with her second dose of Pfizer on 03/30/2019.     Review of systems: ROS  All other systems negative unless noted above in HPI  Past medical/social/surgical/family history have been reviewed and are unchanged unless specifically indicated below.  No changes  Medication List: Current Outpatient  Medications  Medication Sig Dispense Refill  . albuterol (PROVENTIL HFA;VENTOLIN HFA) 108 (90 Base) MCG/ACT inhaler Inhale 2 puffs into the lungs every 4 (four) hours as needed for wheezing or shortness of breath. 18 g 1  . albuterol (PROVENTIL) (2.5 MG/3ML) 0.083% nebulizer solution USE 1 VIAL EVERY 4-6 HOURS AS NEEDED FOR COUGH, WHEEZE, SHORTNESS OF BREATH, OR CHEST TIGHTNESS 150 mL 0  . amitriptyline (ELAVIL) 25 MG tablet Take 25-50 mg by mouth at bedtime.    Marland Kitchen aspirin 81 MG EC tablet Take 81 mg by mouth daily. ! Tablet daily    . Cholecalciferol (VITAMIN D) 50 MCG (2000 UT) CAPS Take 5,000 Units by mouth daily.     . clonazePAM (KLONOPIN) 0.5 MG tablet One tab po bid prn 60 tablet 1  . EPINEPHrine 0.3 mg/0.3 mL IJ SOAJ injection Inject 0.3 mLs (0.3 mg total) into the muscle as needed for anaphylaxis. 1 each 1  . fexofenadine (ALLEGRA) 180 MG tablet TAKE ONE TABLET ONCE DAILY FOR RUNNY NOSE OR ITCHING (Patient taking differently: Take 180 mg by mouth daily. ) 90 tablet 3  . Fluticasone-Salmeterol 232-14 MCG/ACT AEPB TAKE 2 PUFFS BY MOUTH TWICE A DAY 1 each 2  . gabapentin (NEURONTIN) 300 MG capsule daily.     . hydroxypropyl methylcellulose / hypromellose (ISOPTO TEARS / GONIOVISC) 2.5 % ophthalmic solution Place 1 drop into  both eyes 3 (three) times daily as needed for dry eyes.    Boris Lown Oil (OMEGA-3) 500 MG CAPS Take 500 mg by mouth daily.    . magnesium oxide (MAG-OX) 400 MG tablet Take 400 mg by mouth daily.    . Menthol-Methyl Salicylate (TIGER BALM LINIMENT EX) Apply 1 application topically at bedtime as needed (pain).    . montelukast (SINGULAIR) 10 MG tablet Take 1 tablet (10 mg total) by mouth at bedtime. 30 tablet 5  . oxybutynin (DITROPAN-XL) 10 MG 24 hr tablet Take 10 mg by mouth daily.    Marland Kitchen tiZANidine (ZANAFLEX) 4 MG tablet Take 4 mg by mouth every 8 (eight) hours as needed.    . traMADol (ULTRAM) 50 MG tablet Take 50 mg by mouth every 8 (eight) hours as needed for moderate pain.      . vitamin E 400 UNIT capsule Take 400 Units by mouth daily.    . fluticasone furoate-vilanterol (BREO ELLIPTA) 200-25 MCG/INH AEPB Inhale 1 puff into the lungs daily. 28 each 5   No current facility-administered medications for this visit.     Known medication allergies: Allergies  Allergen Reactions  . Erythromycin Nausea And Vomiting  . Meloxicam     Bruised her legs all over  "Fixed drug eruption"  . Nsaids     Scarring and bruising of legs  . Sulfa Antibiotics     Mouth broke out in sores     Physical examination: Blood pressure 116/78, pulse (!) 102, temperature 98.3 F (36.8 C), temperature source Temporal, resp. rate 16, height 4\' 11"  (1.499 m), weight 209 lb 9.6 oz (95.1 kg), SpO2 95 %.  General: Alert, interactive, in no acute distress. HEENT: TMs pearly gray, turbinates non-edematous without discharge, post-pharynx non erythematous. Neck: Supple without lymphadenopathy. Lungs: Clear to auscultation without wheezing, rhonchi or rales. {no increased work of breathing. CV: Normal S1, S2 without murmurs. Abdomen: Nondistended, nontender. Skin: Warm and dry, without lesions or rashes. Extremities:  No clubbing, cyanosis or edema. Neuro:   Grossly intact.  Diagnositics/Labs:   Spirometry: FEV1: 1.52 L 96%, FVC: 1.69 L 83%, ratio consistent with Nonobstructive pattern   Assessment and plan:   Asthma, moderate persistent - will have you try Breo 1 puff once a day.  Keeping with a dry powder formulation as she does like this inhaler formulation and it has been controlling her symptoms.  I will hope that works just as well as the air duo has.   - albuterol as needed 2 puffs every 4-6 hours for cough, wheeze, chest tightness, difficulty breathing - continue singulair 10mg  at bedtime daily - your lung function testing today looks great! Asthma control goals:   Full participation in all desired activities (may need albuterol before activity)  Albuterol  use two time or less a week on average (not counting use with activity)  Cough interfering with sleep two time or less a month  Oral steroids no more than once a year  No hospitalizations  Allergic rhinitis with conjunctivitis - continue singulair as above - can use Allegra 180mg  daily as needed - for nasal congestion may use nasal fluticasone (Flonase) 1-2 sprays each nostril daily for 1-2 weeks at a time if needed   Follow-up in  4-6 months or sooner if needed  I appreciate the opportunity to take part in Olivia Paul's care. Please do not hesitate to contact me with questions.  Sincerely,   Virgel Bouquet, MD Allergy/Immunology [facility]

## 2019-10-18 MED ORDER — BREO ELLIPTA 200-25 MCG/INH IN AEPB: 1.0000 | INHALATION_SPRAY | Freq: Every day | RESPIRATORY_TRACT | 5 refills | Status: DC

## 2019-10-18 MED ORDER — MONTELUKAST SODIUM 10 MG PO TABS: 10.0000 mg | ORAL_TABLET | Freq: Every day | ORAL | 5 refills | Status: DC

## 2019-10-30 ENCOUNTER — Encounter: Payer: Self-pay | Admitting: Internal Medicine

## 2019-10-30 ENCOUNTER — Other Ambulatory Visit: Payer: Self-pay | Admitting: Orthopedic Surgery

## 2019-10-30 DIAGNOSIS — M542 Cervicalgia: Secondary | ICD-10-CM

## 2019-10-30 DIAGNOSIS — M5412 Radiculopathy, cervical region: Secondary | ICD-10-CM

## 2019-11-20 ENCOUNTER — Other Ambulatory Visit: Payer: Self-pay

## 2019-11-20 ENCOUNTER — Ambulatory Visit
Admission: RE | Admit: 2019-11-20 | Discharge: 2019-11-20 | Disposition: A | Discharge status: Home / Self Care | Payer: 59 | Source: Ambulatory Visit | Attending: Orthopedic Surgery | Admitting: Orthopedic Surgery

## 2019-11-20 DIAGNOSIS — M5412 Radiculopathy, cervical region: Secondary | ICD-10-CM

## 2019-11-20 DIAGNOSIS — M542 Cervicalgia: Secondary | ICD-10-CM

## 2019-12-20 ENCOUNTER — Other Ambulatory Visit: Payer: Self-pay | Admitting: Obstetrics and Gynecology

## 2019-12-20 DIAGNOSIS — N6310 Unspecified lump in the right breast, unspecified quadrant: Secondary | ICD-10-CM

## 2020-01-02 ENCOUNTER — Ambulatory Visit: Payer: 59 | Admitting: Internal Medicine

## 2020-01-02 ENCOUNTER — Other Ambulatory Visit: Payer: Self-pay

## 2020-01-02 ENCOUNTER — Encounter: Payer: Self-pay | Admitting: Internal Medicine

## 2020-01-02 VITALS — BP 116/74 | HR 90 | Temp 98.4°F | Ht 59.0 in | Wt 207.6 lb

## 2020-01-02 DIAGNOSIS — D72829 Elevated white blood cell count, unspecified: Secondary | ICD-10-CM | POA: Diagnosis not present

## 2020-01-02 DIAGNOSIS — R0789 Other chest pain: Secondary | ICD-10-CM | POA: Diagnosis not present

## 2020-01-02 DIAGNOSIS — F419 Anxiety disorder, unspecified: Secondary | ICD-10-CM | POA: Diagnosis not present

## 2020-01-02 DIAGNOSIS — Z6841 Body Mass Index (BMI) 40.0 and over, adult: Secondary | ICD-10-CM

## 2020-01-02 MED ORDER — DEXILANT 60 MG PO CPDR: 60.0000 mg | DELAYED_RELEASE_CAPSULE | Freq: Every day | ORAL | 0 refills | Status: DC

## 2020-01-02 NOTE — Progress Notes (Signed)
I,Katawbba Wiggins,acting as a Neurosurgeon for Gwynneth Aliment, MD.,have documented all relevant documentation on the behalf of Gwynneth Aliment, MD,as directed by  Gwynneth Aliment, MD while in the presence of Gwynneth Aliment, MD. This visit occurred during the SARS-CoV-2 public health emergency.  Safety protocols were in place, including screening questions prior to the visit, additional usage of staff PPE, and extensive cleaning of exam room while observing appropriate contact time as indicated for disinfecting solutions.  Subjective:     Patient ID: Olivia Paul , female    DOB: 1955/11/25 , 64 y.o.   MRN: [id number]   Chief Complaint  Patient presents with  . Anxiety    HPI  She is here today for f/u anxiety. She reports she takes clonazepam only if she drives. This week, she had to work in [place], so she has taken daily.   Anxiety Presents for follow-up visit. Symptoms include chest pain and panic. Patient reports no compulsions, decreased concentration, depressed mood, feeling of choking or muscle tension.       Past Medical History:  Diagnosis Date  . Arthritis    lt knee  . Asthma 2011  . Complication of anesthesia   . Headache    sinus  . PONV (postoperative nausea and vomiting)   . Pre-diabetes   . Recurrent upper respiratory infection (URI)      Family History  Problem Relation Age of Onset  . Allergic rhinitis Mother   . Allergic rhinitis Father   . Asthma Father   . Urticaria Sister   . Breast cancer Maternal Aunt   . Breast cancer Paternal Aunt   . Breast cancer Paternal Grandmother   . Angioedema Neg Hx   . Atopy Neg Hx   . Eczema Neg Hx   . Immunodeficiency Neg Hx      Current Outpatient Medications:  .  albuterol (PROVENTIL HFA;VENTOLIN HFA) 108 (90 Base) MCG/ACT inhaler, Inhale 2 puffs into the lungs every 4 (four) hours as needed for wheezing or shortness of breath., Disp: 18 g, Rfl: 1 .  albuterol (PROVENTIL) (2.5 MG/3ML) 0.083% nebulizer  solution, USE 1 VIAL EVERY 4-6 HOURS AS NEEDED FOR COUGH, WHEEZE, SHORTNESS OF BREATH, OR CHEST TIGHTNESS, Disp: 150 mL, Rfl: 0 .  amitriptyline (ELAVIL) 25 MG tablet, Take 25-50 mg by mouth at bedtime., Disp: , Rfl:  .  aspirin 81 MG EC tablet, Take 81 mg by mouth daily. ! Tablet daily, Disp: , Rfl:  .  Cholecalciferol (VITAMIN D) 50 MCG (2000 UT) CAPS, Take 5,000 Units by mouth daily. , Disp: , Rfl:  .  clonazePAM (KLONOPIN) 0.5 MG tablet, One tab po bid prn, Disp: 60 tablet, Rfl: 1 .  EPINEPHrine 0.3 mg/0.3 mL IJ SOAJ injection, Inject 0.3 mLs (0.3 mg total) into the muscle as needed for anaphylaxis., Disp: 1 each, Rfl: 1 .  fexofenadine (ALLEGRA) 180 MG tablet, TAKE ONE TABLET ONCE DAILY FOR RUNNY NOSE OR ITCHING (Patient taking differently: Take 180 mg by mouth daily. ), Disp: 90 tablet, Rfl: 3 .  Fluticasone-Salmeterol 232-14 MCG/ACT AEPB, TAKE 2 PUFFS BY MOUTH TWICE A DAY, Disp: 1 each, Rfl: 2 .  gabapentin (NEURONTIN) 300 MG capsule, daily. , Disp: , Rfl:  .  Krill Oil (OMEGA-3) 500 MG CAPS, Take 500 mg by mouth daily., Disp: , Rfl:  .  magnesium oxide (MAG-OX) 400 MG tablet, Take 400 mg by mouth daily., Disp: , Rfl:  .  Menthol-Methyl Salicylate (TIGER BALM LINIMENT EX), Apply  1 application topically at bedtime as needed (pain)., Disp: , Rfl:  .  montelukast (SINGULAIR) 10 MG tablet, Take 1 tablet (10 mg total) by mouth at bedtime., Disp: 30 tablet, Rfl: 5 .  oxybutynin (DITROPAN-XL) 10 MG 24 hr tablet, Take 10 mg by mouth daily., Disp: , Rfl:  .  tiZANidine (ZANAFLEX) 4 MG tablet, Take 4 mg by mouth every 8 (eight) hours as needed., Disp: , Rfl:  .  traMADol (ULTRAM) 50 MG tablet, Take 50 mg by mouth every 8 (eight) hours as needed for moderate pain. , Disp: , Rfl:  .  vitamin E 400 UNIT capsule, Take 400 Units by mouth daily., Disp: , Rfl:  .  dexlansoprazole (DEXILANT) 60 MG capsule, Take 1 capsule (60 mg total) by mouth daily., Disp: 30 capsule, Rfl: 0 .  fluticasone furoate-vilanterol  (BREO ELLIPTA) 200-25 MCG/INH AEPB, Inhale 1 puff into the lungs daily. (Patient not taking: Reported on 01/02/2020), Disp: 28 each, Rfl: 5 .  hydroxypropyl methylcellulose / hypromellose (ISOPTO TEARS / GONIOVISC) 2.5 % ophthalmic solution, Place 1 drop into both eyes 3 (three) times daily as needed for dry eyes. (Patient not taking: Reported on 01/02/2020), Disp: , Rfl:    Allergies  Allergen Reactions  . Erythromycin Nausea And Vomiting  . Meloxicam     Bruised her legs all over  "Fixed drug eruption"  . Nsaids     Scarring and bruising of legs  . Sulfa Antibiotics     Mouth broke out in sores     Review of Systems  Constitutional: Negative.   Respiratory: Negative.   Cardiovascular: Positive for chest pain.       She went to [facility] on [facility] for evaluation of  2 weeks of intermittent chest pain. She reported pain occurred with rest. It was a mixture of both sharp and pressure-like pain. She had neg cardiac workup and neg for PE. She has not had any further episodes since ER visit.   Gastrointestinal: Negative.   Psychiatric/Behavioral: Negative for decreased concentration.  All other systems reviewed and are negative.    Today's Vitals   01/02/20 1118  BP: 116/74  Pulse: 90  Temp: 98.4 F (36.9 C)  TempSrc: Oral  Weight: 207 lb 9.6 oz (94.2 kg)  Height: 4\' 11"  (1.499 m)   Body mass index is 41.93 kg/m.   Objective:  Physical Exam Vitals and nursing note reviewed.  Constitutional:      Appearance: Normal appearance. She is obese.  HENT:     Head: Normocephalic and atraumatic.  Cardiovascular:     Rate and Rhythm: Normal rate and regular rhythm.     Heart sounds: Normal heart sounds.  Pulmonary:     Breath sounds: Normal breath sounds.  Skin:    General: Skin is warm.  Neurological:     General: No focal deficit present.     Mental Status: She is alert and oriented to person, place, and time.         Assessment And Plan:     1.  Anxiety Comments: Chronic, fairly stable. Encouraged to continue with therapy and prn meds. Should c/w magnesium supplementation. She is willing to consider SSRI therapy. I will first see how she tolerates PPI therapy.   2. Leukocytosis, unspecified type Comments: ER records were reviewed. WBC was 19.7, I will repeat this today. Pt advised elevation likely related to recent steroid use.  - CBC with Diff  3. Atypical chest pain Comments: ER records reviewed.  We discussed her sx could be due to reflux. She was given samples of Dexilant to take once daily. She is to call me in a week to let me know how this is working for her. Advised to stop eating 3 hours prior to lying down as well.   4. Class 3 severe obesity due to excess calories with serious comorbidity and body mass index (BMI) of 40.0 to 44.9 in adult [facility]) Comments: She agrees to referral to Weight Mgmt clinic at [facility].  - Ambulatory referral to Internal Medicine    Patient was given opportunity to ask questions. Patient verbalized understanding of the plan and was able to repeat key elements of the plan. All questions were answered to their satisfaction.  Gwynneth Aliment, MD   I, Gwynneth Aliment, MD, have reviewed all documentation for this visit. The documentation on 01/11/20 for the exam, diagnosis, procedures, and orders are all accurate and complete.  THE PATIENT IS ENCOURAGED TO PRACTICE SOCIAL DISTANCING DUE TO THE COVID-19 PANDEMIC.

## 2020-01-02 NOTE — Patient Instructions (Signed)

## 2020-01-13 ENCOUNTER — Other Ambulatory Visit: Payer: Self-pay | Admitting: Internal Medicine

## 2020-01-13 ENCOUNTER — Other Ambulatory Visit: Payer: Self-pay

## 2020-01-13 ENCOUNTER — Encounter: Payer: Self-pay | Admitting: Internal Medicine

## 2020-01-13 MED ORDER — DEXILANT 60 MG PO CPDR
60.0000 mg | DELAYED_RELEASE_CAPSULE | Freq: Every day | ORAL | 1 refills | Status: DC
Start: 2020-01-13 — End: 2021-04-19

## 2020-01-17 ENCOUNTER — Encounter: Payer: Self-pay | Admitting: Cardiology

## 2020-01-17 ENCOUNTER — Other Ambulatory Visit: Payer: Self-pay

## 2020-01-17 ENCOUNTER — Ambulatory Visit: Payer: 59 | Admitting: Cardiology

## 2020-01-17 VITALS — BP 126/88 | HR 87 | Ht 60.0 in | Wt 207.2 lb

## 2020-01-17 DIAGNOSIS — R072 Precordial pain: Secondary | ICD-10-CM

## 2020-01-17 DIAGNOSIS — Z7189 Other specified counseling: Secondary | ICD-10-CM

## 2020-01-17 DIAGNOSIS — Z6841 Body Mass Index (BMI) 40.0 and over, adult: Secondary | ICD-10-CM

## 2020-01-17 NOTE — Progress Notes (Signed)
Cardiology Office Note:    Date:  01/17/2020   ID:  Olivia Paul, DOB 28-Dec-1955, MRN [id number]  PCP:  Glendale Chard, MD  Cardiologist:  Buford Dresser, MD  Referring MD: Glendale Chard, MD   CC: follow up  History of Present Illness:    Olivia Paul is a 64 y.o. female with a hx of asthma, obesity who is seen for follow up. I met her 07/04/2019 as a new consult at the request of Glendale Chard, MD for the evaluation and management of chest pain.  Today: Seen in the ER for chest pain. Seen in [facility] ER 12/15/19. Presented with two weeks of intermittent sharp and pressure like pain. D dimer elevated. Had CTPE, no PE, normal PA size, no evidence of atherosclerotic calcification. hsTni 4, 4.   She was recommended to follow up with her cardiologist. She was sitting on the step, trick or treating with her family. She had a severe episode and couldn't even move. Was central chest pain that went through to her back. This was the worst episode she has ever had in her life, was very worried it was her heart. When she got home, they started again when she went to bed, though less intense. She saw Dr. Baird Cancer since then, started on a reflux medication. Hasn't had an episode since that night.   We discussed chest pain and potential etiologies at length today. I sympathize with her frustration about the cause of her symptoms. She has had a normal stress test, a normal echo, and now no calcium on a CT scan.   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   albuterol (PROVENTIL) (2.5 MG/3ML) 0.083% nebulizer solution USE 1 VIAL EVERY 4-6 HOURS AS NEEDED FOR COUGH, WHEEZE, SHORTNESS OF BREATH, OR CHEST TIGHTNESS   amitriptyline (ELAVIL) 25 MG tablet Take 25-50 mg by mouth at bedtime.   aspirin 81 MG EC tablet Take 81 mg by mouth daily. ! Tablet daily   Cholecalciferol (VITAMIN D) 50 MCG (2000 UT) CAPS Take 5,000 Units by mouth daily.    clonazePAM (KLONOPIN) 0.5 MG tablet One tab po bid prn   dexlansoprazole (DEXILANT) 60 MG capsule Take 1 capsule (60 mg total) by mouth daily.   EPINEPHrine 0.3 mg/0.3 mL IJ SOAJ injection Inject 0.3 mLs (0.3 mg total) into the muscle as needed for anaphylaxis.   fexofenadine (ALLEGRA) 180 MG tablet TAKE ONE TABLET ONCE DAILY FOR RUNNY NOSE OR ITCHING (Patient taking differently: Take 180 mg by mouth daily. )   fluticasone furoate-vilanterol (BREO ELLIPTA) 200-25 MCG/INH AEPB Inhale 1 puff into the lungs daily.  Fluticasone-Salmeterol 232-14 MCG/ACT AEPB TAKE 2 PUFFS BY MOUTH TWICE A DAY   gabapentin (NEURONTIN) 300 MG capsule daily.    hydroxypropyl methylcellulose / hypromellose (ISOPTO TEARS / GONIOVISC) 2.5 % ophthalmic solution Place 1 drop into both eyes 3 (three) times daily as needed for dry eyes.    Krill Oil (OMEGA-3) 500 MG CAPS Take 500 mg by mouth daily.   magnesium oxide (MAG-OX) 400 MG tablet Take 400 mg by mouth daily.   Menthol-Methyl Salicylate (TIGER BALM LINIMENT EX) Apply 1 application topically at bedtime as needed (pain).   montelukast (SINGULAIR) 10 MG tablet Take 1 tablet (10 mg total) by mouth at bedtime.   oxybutynin (DITROPAN-XL) 10 MG 24  hr tablet Take 10 mg by mouth daily.   tiZANidine (ZANAFLEX) 4 MG tablet Take 4 mg by mouth every 8 (eight) hours as needed.   traMADol (ULTRAM) 50 MG tablet Take 50 mg by mouth every 8 (eight) hours as needed for moderate pain.    vitamin E 400 UNIT capsule Take 400 Units by mouth daily.   No current facility-administered medications on file prior to visit.     Allergies:   Erythromycin, Meloxicam, Nsaids, and Sulfa antibiotics   Social History   Tobacco Use   Smoking status: Never Smoker   Smokeless tobacco: Never Used  Vaping Use   Vaping Use: Never used  Substance Use Topics   Alcohol use: No    Alcohol/week: 0.0 standard drinks   Drug use: No    Family History: family history includes Allergic rhinitis in her father and mother; Asthma in her father; Breast cancer in her maternal aunt, paternal aunt, and paternal grandmother; Urticaria in her sister. There is no history of Angioedema, Atopy, Eczema, or Immunodeficiency.  ROS:   Please see the history of present illness.  Additional pertinent ROS: Constitutional: Negative for chills, fever, night sweats, unintentional weight loss  HENT: Negative for ear pain and hearing loss.   Eyes: Negative for loss of vision and eye pain.  Respiratory: Negative for cough, sputum, wheezing.   Cardiovascular: See HPI. Gastrointestinal: Negative for abdominal pain, melena, and hematochezia.  Genitourinary: Negative for dysuria and hematuria.  Musculoskeletal: Negative for falls and myalgias.  Skin: Negative for itching and rash.  Neurological: Negative for focal weakness, focal sensory changes and loss of consciousness.  Endo/Heme/Allergies: Does not bruise/bleed easily.     EKGs/Labs/Other Studies Reviewed:    The following studies were reviewed today: CTPE ([facility]) 12/15/19 Cardiovascular: Contrast injection is sufficient to demonstrate  satisfactory opacification of the pulmonary arteries to the  segmental level. There is no  pulmonary embolus or evidence of right  heart strain. The size of the main pulmonary artery is normal. Heart  size is normal, with no pericardial effusion. The course and caliber  of the aorta are normal. There is no atherosclerotic calcification.   Echo 07/05/19  1. Left ventricular ejection fraction, by estimation, is 60 to 65%. The  left ventricle has normal function. The left ventricle has no regional  wall motion abnormalities. Left ventricular diastolic parameters were  normal.   2. Right ventricular systolic function is normal. The right ventricular  size is normal. There is normal pulmonary artery systolic pressure.   3. The mitral valve is normal in structure. Trivial mitral valve  regurgitation. No evidence of mitral stenosis.   4. The aortic valve is grossly normal. Aortic valve regurgitation is not  visualized. No aortic stenosis is present.   5. The inferior vena cava  is normal in size with <50% respiratory  variability, suggesting right atrial pressure of 8 mmHg.  EKG:  EKG is personally reviewed.  The ekg ordered today demonstrates NSR at 87 bpm  Recent Labs: 04/03/2019: ALT 15; BUN 13; Creatinine, Ser 0.61; Hemoglobin 13.4; Platelets 435; Potassium 4.1; Sodium 140 05/27/2019: TSH 0.480  Recent Lipid Panel    Component Value Date/Time   CHOL 172 04/03/2019 1541   TRIG 48 04/03/2019 1541   HDL 56 04/03/2019 1541   CHOLHDL 3.1 04/03/2019 1541   LDLCALC 106 (H) 04/03/2019 1541    Physical Exam:    VS:  BP 126/88 (BP Location: Left Arm, Patient Position: Sitting)   Pulse 87   Ht 5' (1.524 m)   Wt 207 lb 3.2 oz (94 kg)   SpO2 99%   BMI 40.47 kg/m     Wt Readings from Last 3 Encounters:  01/17/20 207 lb 3.2 oz (94 kg)  01/02/20 207 lb 9.6 oz (94.2 kg)  10/17/19 209 lb 9.6 oz (95.1 kg)    GEN: Well nourished, well developed in no acute distress HEENT: Normal, moist mucous membranes NECK: No JVD CARDIAC: regular rhythm, normal S1 and S2, no rubs or gallops. No  murmur. VASCULAR: Radial and DP pulses 2+ bilaterally. No carotid bruits RESPIRATORY:  Clear to auscultation without rales, wheezing or rhonchi  ABDOMEN: Soft, non-tender, non-distended MUSCULOSKELETAL:  Ambulates independently SKIN: Warm and dry, no edema NEUROLOGIC:  Alert and oriented x 3. No focal neuro deficits noted. PSYCHIATRIC:  Normal affect    ASSESSMENT:    1. Precordial pain   2. Cardiac risk counseling   3. Counseling on health promotion and disease prevention   4. Class 3 severe obesity due to excess calories without serious comorbidity with body mass index (BMI) of 40.0 to 44.9 in adult [facility])    PLAN:    Chest pain:  -atypical symptoms -remote stress test reported as normal (I cannot see results) -echo normal, CTPE without any evidence of aortic or coronary calcification. -We spent significant time today reviewing different parts of the cardiovascular system (electrical, vascular, functional, and valvular). We discussed how each of these systems can present with different symptoms. We reviewed that there are different ways we evaluate these symptoms with tests. We reviewed the results of her testing at length. We also discussed that if testing is unrevealing for a cardiac cause of the symptoms, there are many noncardiac causes as well that can contribute to symptoms. If the heart is ruled out, then I recommend returning to PCP to discuss alternative diagnoses.  -reviewed red flag warning signs that need immediate medical attention  Obesity: BMI 40 -working on lifestyle and weight loss  Cardiac risk counseling and prevention recommendations: -recommend heart healthy/Mediterranean diet, with whole grains, fruits, vegetable, fish, lean meats, nuts, and olive oil. Limit salt. -recommend moderate walking, 3-5 times/week for 30-50 minutes each session. Aim for at least 150 minutes.week. Goal should be pace of 3 miles/hours, or walking 1.5 miles in 30 minutes -recommend  avoidance of tobacco products. Avoid excess alcohol. -ASCVD risk score: The 10-year ASCVD risk score Mikey Bussing DC Brooke Bonito., et al., 2013) is: 4.5%   Values used to calculate the score:     Age: 80 years     Sex: Female     Is Non-Hispanic African American: Yes     Diabetic: No     Tobacco smoker: No     Systolic Blood Pressure: 992 mmHg     Is BP  treated: No     HDL Cholesterol: 56 mg/dL     Total Cholesterol: 172 mg/dL    Plan for follow up: I would be happy to see her back as needed  Buford Dresser, MD, PhD   [facility]    Medication Adjustments/Labs and Tests Ordered: Current medicines are reviewed at length with the patient today.  Concerns regarding medicines are outlined above.  Orders Placed This Encounter  Procedures   EKG 12-Lead   No orders of the defined types were placed in this encounter.   Patient Instructions  Medication Instructions:  Your Physician recommend you continue on your current medication as directed.    *If you need a refill on your cardiac medications before your next appointment, please call your pharmacy*   Lab Work: None   Testing/Procedures: None   Follow-Up: At [facility], you and your health needs are our priority.  As part of our continuing mission to provide you with exceptional heart care, we have created designated Provider Care Teams.  These Care Teams include your primary Cardiologist (physician) and Advanced Practice Providers (APPs -  Physician Assistants and Nurse Practitioners) who all work together to provide you with the care you need, when you need it.  We recommend signing up for the patient portal called "MyChart".  Sign up information is provided on this After Visit Summary.  MyChart is used to connect with patients for Virtual Visits (Telemedicine).  Patients are able to view lab/test results, encounter notes, upcoming appointments, etc.  Non-urgent messages can be sent to your provider as well.   To  learn more about what you can do with MyChart, go to [email or web address].    Your next appointment:   As needed  The format for your next appointment:   In Person  Provider:   Buford Dresser, MD    Signed, Buford Dresser, MD PhD 01/17/2020  [place]

## 2020-01-17 NOTE — Patient Instructions (Signed)

## 2020-02-12 ENCOUNTER — Other Ambulatory Visit: Payer: Self-pay

## 2020-02-12 ENCOUNTER — Ambulatory Visit: Payer: 59

## 2020-02-12 ENCOUNTER — Ambulatory Visit
Admission: RE | Admit: 2020-02-12 | Discharge: 2020-02-12 | Disposition: A | Discharge status: Home / Self Care | Payer: 59 | Source: Ambulatory Visit | Attending: Obstetrics and Gynecology | Admitting: Obstetrics and Gynecology

## 2020-02-12 DIAGNOSIS — N6310 Unspecified lump in the right breast, unspecified quadrant: Secondary | ICD-10-CM

## 2020-03-21 ENCOUNTER — Other Ambulatory Visit: Payer: Self-pay | Admitting: Internal Medicine

## 2020-03-21 DIAGNOSIS — F419 Anxiety disorder, unspecified: Secondary | ICD-10-CM

## 2020-03-23 ENCOUNTER — Other Ambulatory Visit: Payer: Self-pay | Admitting: Physical Medicine and Rehabilitation

## 2020-03-23 DIAGNOSIS — M545 Low back pain, unspecified: Secondary | ICD-10-CM

## 2020-03-23 NOTE — Telephone Encounter (Signed)
Clonozapam refill

## 2020-04-09 ENCOUNTER — Other Ambulatory Visit: Payer: Self-pay

## 2020-04-09 ENCOUNTER — Encounter: Payer: Self-pay | Admitting: Internal Medicine

## 2020-04-09 ENCOUNTER — Ambulatory Visit: Payer: 59 | Admitting: Internal Medicine

## 2020-04-09 VITALS — BP 118/82 | HR 83 | Temp 97.3°F | Ht 59.0 in | Wt 207.0 lb

## 2020-04-09 DIAGNOSIS — M25562 Pain in left knee: Secondary | ICD-10-CM | POA: Diagnosis not present

## 2020-04-09 DIAGNOSIS — Z6841 Body Mass Index (BMI) 40.0 and over, adult: Secondary | ICD-10-CM

## 2020-04-09 DIAGNOSIS — G8929 Other chronic pain: Secondary | ICD-10-CM | POA: Diagnosis not present

## 2020-04-09 DIAGNOSIS — Z0000 Encounter for general adult medical examination without abnormal findings: Secondary | ICD-10-CM | POA: Diagnosis not present

## 2020-04-09 DIAGNOSIS — F419 Anxiety disorder, unspecified: Secondary | ICD-10-CM

## 2020-04-09 MED ORDER — DULOXETINE HCL 20 MG PO CPEP: 20.0000 mg | ORAL_CAPSULE | Freq: Every day | ORAL | 2 refills | Status: DC

## 2020-04-09 NOTE — Progress Notes (Signed)
I,Katawbba Wiggins,acting as a Education administrator for Maximino Greenland, MD.,have documented all relevant documentation on the behalf of Maximino Greenland, MD,as directed by  Maximino Greenland, MD while in the presence of Maximino Greenland, MD.  This visit occurred during the SARS-CoV-2 public health emergency.  Safety protocols were in place, including screening questions prior to the visit, additional usage of staff PPE, and extensive cleaning of exam room while observing appropriate contact time as indicated for disinfecting solutions.  Subjective:     Patient ID: Olivia Paul , female    DOB: [birth year] , 65 y.o.   MRN: [id number]   Chief Complaint  Patient presents with  . Annual Exam    HPI  She is here today for a full physical examination. She is followed by Dr. Drema Dallas for her GYN care, last pap was 2021. She denies having any headaches, chest pain and shortness of breath.     Past Medical History:  Diagnosis Date  . Arthritis    lt knee  . Asthma 2011  . Complication of anesthesia   . Headache    sinus  . PONV (postoperative nausea and vomiting)   . Pre-diabetes   . Recurrent upper respiratory infection (URI)      Family History  Problem Relation Age of Onset  . Allergic rhinitis Mother   . Allergic rhinitis Father   . Asthma Father   . Urticaria Sister   . Breast cancer Maternal Aunt   . Breast cancer Paternal Aunt   . Breast cancer Paternal Grandmother   . Angioedema Neg Hx   . Atopy Neg Hx   . Eczema Neg Hx   . Immunodeficiency Neg Hx      Current Outpatient Medications:  .  albuterol (PROVENTIL HFA;VENTOLIN HFA) 108 (90 Base) MCG/ACT inhaler, Inhale 2 puffs into the lungs every 4 (four) hours as needed for wheezing or shortness of breath., Disp: 18 g, Rfl: 1 .  albuterol (PROVENTIL) (2.5 MG/3ML) 0.083% nebulizer solution, USE 1 VIAL EVERY 4-6 HOURS AS NEEDED FOR COUGH, WHEEZE, SHORTNESS OF BREATH, OR CHEST TIGHTNESS, Disp: 150 mL, Rfl: 0 .  amitriptyline  (ELAVIL) 25 MG tablet, Take 25 mg by mouth at bedtime., Disp: , Rfl:  .  Cholecalciferol (VITAMIN D) 50 MCG (2000 UT) CAPS, Take 5,000 Units by mouth daily. , Disp: , Rfl:  .  clonazePAM (KLONOPIN) 0.5 MG tablet, TAKE 1 TABLET BY MOUTH TWICE A DAY AS NEEDED, Disp: 60 tablet, Rfl: 1 .  dexlansoprazole (DEXILANT) 60 MG capsule, Take 1 capsule (60 mg total) by mouth daily., Disp: 90 capsule, Rfl: 1 .  DULoxetine (CYMBALTA) 20 MG capsule, Take 1 capsule (20 mg total) by mouth daily., Disp: 30 capsule, Rfl: 2 .  EPINEPHrine 0.3 mg/0.3 mL IJ SOAJ injection, Inject 0.3 mLs (0.3 mg total) into the muscle as needed for anaphylaxis., Disp: 1 each, Rfl: 1 .  fexofenadine (ALLEGRA) 180 MG tablet, TAKE ONE TABLET ONCE DAILY FOR RUNNY NOSE OR ITCHING (Patient taking differently: Take 180 mg by mouth daily.), Disp: 90 tablet, Rfl: 3 .  Fluticasone-Salmeterol 232-14 MCG/ACT AEPB, TAKE 2 PUFFS BY MOUTH TWICE A DAY, Disp: 1 each, Rfl: 2 .  gabapentin (NEURONTIN) 300 MG capsule, daily. , Disp: , Rfl:  .  magnesium oxide (MAG-OX) 400 MG tablet, Take 400 mg by mouth daily., Disp: , Rfl:  .  Menthol-Methyl Salicylate (TIGER BALM LINIMENT EX), Apply 1 application topically at bedtime as needed (pain)., Disp: , Rfl:  .  montelukast (SINGULAIR) 10 MG tablet, Take 1 tablet (10 mg total) by mouth at bedtime., Disp: 30 tablet, Rfl: 5 .  oxybutynin (DITROPAN-XL) 10 MG 24 hr tablet, Take 10 mg by mouth daily., Disp: , Rfl:  .  tiZANidine (ZANAFLEX) 4 MG tablet, Take 4 mg by mouth every 8 (eight) hours as needed., Disp: , Rfl:  .  traMADol (ULTRAM) 50 MG tablet, Take 50 mg by mouth every 8 (eight) hours as needed for moderate pain. , Disp: , Rfl:  .  TURMERIC PO, Take by mouth., Disp: , Rfl:  .  vitamin E 400 UNIT capsule, Take 400 Units by mouth daily., Disp: , Rfl:  .  aspirin 81 MG EC tablet, Take 81 mg by mouth daily. ! Tablet daily (Patient not taking: Reported on 04/09/2020), Disp: , Rfl:  .  fluticasone furoate-vilanterol  (BREO ELLIPTA) 200-25 MCG/INH AEPB, Inhale 1 puff into the lungs daily. (Patient not taking: Reported on 04/09/2020), Disp: 28 each, Rfl: 5 .  Krill Oil (OMEGA-3) 500 MG CAPS, Take 500 mg by mouth daily. (Patient not taking: Reported on 04/09/2020), Disp: , Rfl:    Allergies  Allergen Reactions  . Erythromycin Nausea And Vomiting  . Meloxicam     Bruised her legs all over  "Fixed drug eruption"  . Nsaids     Scarring and bruising of legs  . Sulfa Antibiotics     Mouth broke out in sores      The patient states she uses post menopausal status for birth control. Last LMP was No LMP recorded. Patient is postmenopausal.. Negative for Dysmenorrhea. Negative for: breast discharge, breast lump(s), breast pain and breast self exam. Associated symptoms include abnormal vaginal bleeding. Pertinent negatives include abnormal bleeding (hematology), anxiety, decreased libido, depression, difficulty falling sleep, dyspareunia, history of infertility, nocturia, sexual dysfunction, sleep disturbances, urinary incontinence, urinary urgency, vaginal discharge and vaginal itching. Diet regular.The patient states her exercise level is  intermittent.  . The patient's tobacco use is:  Social History   Tobacco Use  Smoking Status Never Smoker  Smokeless Tobacco Never Used  . She has been exposed to passive smoke. The patient's alcohol use is:  Social History   Substance and Sexual Activity  Alcohol Use No  . Alcohol/week: 0.0 standard drinks   Review of Systems  Constitutional: Negative.   HENT: Negative.   Eyes: Negative.   Respiratory: Negative.   Cardiovascular: Negative.   Gastrointestinal: Negative.   Endocrine: Negative.   Genitourinary: Negative.   Musculoskeletal: Positive for arthralgias.       She has chronic knee pain. Has been followed at [facility]. Unfortunately, she has not had any relief of her sx. Plans to get second opinion. She feels pain is affecting her ability to function on a  daily basis.   Skin: Negative.   Allergic/Immunologic: Negative.   Neurological: Negative.   Hematological: Negative.   Psychiatric/Behavioral: Negative.      Today's Vitals   04/09/20 0948  BP: 118/82  Pulse: 83  Temp: (!) 97.3 F (36.3 C)  TempSrc: Oral  Weight: 207 lb (93.9 kg)  Height: 4' 11" (1.499 m)   Body mass index is 41.81 kg/m.  Wt Readings from Last 3 Encounters:  04/09/20 207 lb (93.9 kg)  01/17/20 207 lb 3.2 oz (94 kg)  01/02/20 207 lb 9.6 oz (94.2 kg)   Objective:  Physical Exam Vitals and nursing note reviewed.  Constitutional:      General: She is not in acute distress.  Appearance: Normal appearance. She is well-developed. She is obese.  HENT:     Head: Normocephalic and atraumatic.     Right Ear: Hearing, tympanic membrane, ear canal and external ear normal. There is no impacted cerumen.     Left Ear: Hearing, tympanic membrane, ear canal and external ear normal. There is no impacted cerumen.     Nose:     Comments: Masked     Mouth/Throat:     Comments: Masked  Eyes:     General: Lids are normal.     Extraocular Movements: Extraocular movements intact.     Conjunctiva/sclera: Conjunctivae normal.     Pupils: Pupils are equal, round, and reactive to light.     Funduscopic exam:    Right eye: No papilledema.        Left eye: No papilledema.  Neck:     Thyroid: No thyroid mass.     Vascular: No carotid bruit.  Cardiovascular:     Rate and Rhythm: Normal rate and regular rhythm.     Pulses: Normal pulses.     Heart sounds: Normal heart sounds. No murmur heard.   Pulmonary:     Effort: Pulmonary effort is normal.     Breath sounds: Normal breath sounds.  Chest:  Breasts:     Tanner Score is 5.     Right: Normal.     Left: Normal.    Abdominal:     General: Bowel sounds are normal. There is no distension.     Palpations: Abdomen is soft.     Tenderness: There is no abdominal tenderness.     Comments: Obese, soft  Musculoskeletal:         General: No swelling. Normal range of motion.     Cervical back: Full passive range of motion without pain, normal range of motion and neck supple.     Right lower leg: No edema.     Left lower leg: No edema.     Comments: Healed surgical scar left knee  Skin:    General: Skin is warm and dry.     Capillary Refill: Capillary refill takes less than 2 seconds.  Neurological:     General: No focal deficit present.     Mental Status: She is alert and oriented to person, place, and time.     Cranial Nerves: No cranial nerve deficit.     Sensory: No sensory deficit.  Psychiatric:        Mood and Affect: Mood normal.        Behavior: Behavior normal.        Thought Content: Thought content normal.        Judgment: Judgment normal.         Assessment And Plan:     1. Routine general medical examination at health care facility Comments: A full exam was performed. Importance of monthly self breast exams was discussed with the patient. PATIENT IS ADVISED TO GET 30-45 MINUTES REGULAR EXERCISE NO LESS THAN FOUR TO FIVE DAYS PER WEEK - BOTH WEIGHTBEARING EXERCISES AND AEROBIC ARE RECOMMENDED.  PATIENT IS ADVISED TO FOLLOW A HEALTHY DIET WITH AT LEAST SIX FRUITS/VEGGIES PER DAY, DECREASE INTAKE OF RED MEAT, AND TO INCREASE FISH INTAKE TO TWO DAYS PER WEEK.  MEATS/FISH SHOULD NOT BE FRIED, BAKED OR BROILED IS PREFERABLE.  I SUGGEST WEARING SPF 50 SUNSCREEN ON EXPOSED PARTS AND ESPECIALLY WHEN IN THE DIRECT SUNLIGHT FOR AN EXTENDED PERIOD OF TIME.  PLEASE AVOID FAST FOOD RESTAURANTS  AND INCREASE YOUR WATER INTAKE.  .- Hemoglobin A1c - CBC - CMP14+EGFR - Lipid panel  2. Anxiety Comments: Chronic, still on clonazepam. She is willing to start a daily medication. I will call in duloxetine nightly, this may help with her chronic pain as well. Possible side effects were discussed with the patient. Pt advised I will consult w/ pharmacy to determine best way to wean off of amitriptyline prior to  starting duloxetine. She will f/u 4-6 weeks after starting the duloxetine. She verbalizes understanding of her treatment plan.   3. Chronic pain of left knee Comments: She plans to schedule 2nd opinion at [facility]. Encouraged to follow anti-inflammatory diet.  - VITAMIN D 25 Hydroxy (Vit-D Deficiency, Fractures)  4. Class 3 severe obesity due to excess calories with serious comorbidity and body mass index (BMI) of 40.0 to 44.9 in adult [facility]) Comments: BMI 41. She has not gained/lost any weight since last visit. Encouraged to resume swimming/water aerobics. She is encouraged to strive to lose ten percent of her body weight to decrease cardiac risk.   Patient was given opportunity to ask questions. Patient verbalized understanding of the plan and was able to repeat key elements of the plan. All questions were answered to their satisfaction.   I, Maximino Greenland, MD, have reviewed all documentation for this visit. The documentation on 04/11/20 for the exam, diagnosis, procedures, and orders are all accurate and complete.  THE PATIENT IS ENCOURAGED TO PRACTICE SOCIAL DISTANCING DUE TO THE COVID-19 PANDEMIC.

## 2020-04-09 NOTE — Patient Instructions (Signed)
Health Maintenance, Female Adopting a healthy lifestyle and getting preventive care are important in promoting health and wellness. Ask your health care provider about:  The right schedule for you to have regular tests and exams.  Things you can do on your own to prevent diseases and keep yourself healthy. What should I know about diet, weight, and exercise? Eat a healthy diet  Eat a diet that includes plenty of vegetables, fruits, low-fat dairy products, and lean protein.  Do not eat a lot of foods that are high in solid fats, added sugars, or sodium.   Maintain a healthy weight Body mass index (BMI) is used to identify weight problems. It estimates body fat based on height and weight. Your health care provider can help determine your BMI and help you achieve or maintain a healthy weight. Get regular exercise Get regular exercise. This is one of the most important things you can do for your health. Most adults should:  Exercise for at least 150 minutes each week. The exercise should increase your heart rate and make you sweat (moderate-intensity exercise).  Do strengthening exercises at least twice a week. This is in addition to the moderate-intensity exercise.  Spend less time sitting. Even light physical activity can be beneficial. Watch cholesterol and blood lipids Have your blood tested for lipids and cholesterol at 64 years of age, then have this test every 5 years. Have your cholesterol levels checked more often if:  Your lipid or cholesterol levels are high.  You are older than 64 years of age.  You are at high risk for heart disease. What should I know about cancer screening? Depending on your health history and family history, you may need to have cancer screening at various ages. This may include screening for:  Breast cancer.  Cervical cancer.  Colorectal cancer.  Skin cancer.  Lung cancer. What should I know about heart disease, diabetes, and high blood  pressure? Blood pressure and heart disease  High blood pressure causes heart disease and increases the risk of stroke. This is more likely to develop in people who have high blood pressure readings, are of African descent, or are overweight.  Have your blood pressure checked: ? Every 3-5 years if you are 18-39 years of age. ? Every year if you are 40 years old or older. Diabetes Have regular diabetes screenings. This checks your fasting blood sugar level. Have the screening done:  Once every three years after age 40 if you are at a normal weight and have a low risk for diabetes.  More often and at a younger age if you are overweight or have a high risk for diabetes. What should I know about preventing infection? Hepatitis B If you have a higher risk for hepatitis B, you should be screened for this virus. Talk with your health care provider to find out if you are at risk for hepatitis B infection. Hepatitis C Testing is recommended for:  Everyone born from 1945 through 1965.  Anyone with known risk factors for hepatitis C. Sexually transmitted infections (STIs)  Get screened for STIs, including gonorrhea and chlamydia, if: ? You are sexually active and are younger than 64 years of age. ? You are older than 64 years of age and your health care provider tells you that you are at risk for this type of infection. ? Your sexual activity has changed since you were last screened, and you are at increased risk for chlamydia or gonorrhea. Ask your health care provider   if you are at risk.  Ask your health care provider about whether you are at high risk for HIV. Your health care provider may recommend a prescription medicine to help prevent HIV infection. If you choose to take medicine to prevent HIV, you should first get tested for HIV. You should then be tested every 3 months for as long as you are taking the medicine. Pregnancy  If you are about to stop having your period (premenopausal) and  you may become pregnant, seek counseling before you get pregnant.  Take 400 to 800 micrograms (mcg) of folic acid every day if you become pregnant.  Ask for birth control (contraception) if you want to prevent pregnancy. Osteoporosis and menopause Osteoporosis is a disease in which the bones lose minerals and strength with aging. This can result in bone fractures. If you are 65 years old or older, or if you are at risk for osteoporosis and fractures, ask your health care provider if you should:  Be screened for bone loss.  Take a calcium or vitamin D supplement to lower your risk of fractures.  Be given hormone replacement therapy (HRT) to treat symptoms of menopause. Follow these instructions at home: Lifestyle  Do not use any products that contain nicotine or tobacco, such as cigarettes, e-cigarettes, and chewing tobacco. If you need help quitting, ask your health care provider.  Do not use street drugs.  Do not share needles.  Ask your health care provider for help if you need support or information about quitting drugs. Alcohol use  Do not drink alcohol if: ? Your health care provider tells you not to drink. ? You are pregnant, may be pregnant, or are planning to become pregnant.  If you drink alcohol: ? Limit how much you use to 0-1 drink a day. ? Limit intake if you are breastfeeding.  Be aware of how much alcohol is in your drink. In the [place] one drink equals one 12 oz bottle of beer (355 mL), one 5 oz glass of wine (148 mL), or one 1 oz glass of hard liquor (44 mL). General instructions  Schedule regular health, dental, and eye exams.  Stay current with your vaccines.  Tell your health care provider if: ? You often feel depressed. ? You have ever been abused or do not feel safe at home. Summary  Adopting a healthy lifestyle and getting preventive care are important in promoting health and wellness.  Follow your health care provider's instructions about healthy  diet, exercising, and getting tested or screened for diseases.  Follow your health care provider's instructions on monitoring your cholesterol and blood pressure. This information is not intended to replace advice given to you by your health care provider. Make sure you discuss any questions you have with your health care provider. Document Revised: 01/24/2018 Document Reviewed: 01/24/2018 Elsevier Patient Education  2021 [employer]  

## 2020-04-10 LAB — CMP14+EGFR
ALT: 15 IU/L (ref 0–32)
AST: 15 IU/L (ref 0–40)
Albumin/Globulin Ratio: 1.6 (ref 1.2–2.2)
Albumin: 4.6 g/dL (ref 3.8–4.8)
Alkaline Phosphatase: 127 IU/L — ABNORMAL HIGH (ref 44–121)
BUN/Creatinine Ratio: 14 (ref 12–28)
BUN: 12 mg/dL (ref 8–27)
Bilirubin Total: 0.8 mg/dL (ref 0.0–1.2)
CO2: 25 mmol/L (ref 20–29)
Calcium: 9.8 mg/dL (ref 8.7–10.3)
Chloride: 100 mmol/L (ref 96–106)
Creatinine, Ser: 0.84 mg/dL (ref 0.57–1.00)
GFR calc Af Amer: 86 mL/min/{1.73_m2} (ref >59)
GFR calc non Af Amer: 74 mL/min/{1.73_m2} (ref >59)
Globulin, Total: 2.9 g/dL (ref 1.5–4.5)
Glucose: 80 mg/dL (ref 65–99)
Potassium: 4.6 mmol/L (ref 3.5–5.2)
Sodium: 142 mmol/L (ref 134–144)
Total Protein: 7.5 g/dL (ref 6.0–8.5)

## 2020-04-10 LAB — LIPID PANEL
Chol/HDL Ratio: 3.4 ratio (ref 0.0–4.4)
Cholesterol, Total: 184 mg/dL (ref 100–199)
HDL: 54 mg/dL (ref >39)
LDL Chol Calc (NIH): 117 mg/dL — ABNORMAL HIGH (ref 0–99)
Triglycerides: 69 mg/dL (ref 0–149)
VLDL Cholesterol Cal: 13 mg/dL (ref 5–40)

## 2020-04-10 LAB — CBC
Hematocrit: 44.2 % (ref 34.0–46.6)
Hemoglobin: 14.1 g/dL (ref 11.1–15.9)
MCH: 27.3 pg (ref 26.6–33.0)
MCHC: 31.9 g/dL (ref 31.5–35.7)
MCV: 86 fL (ref 79–97)
Platelets: 454 10*3/uL — ABNORMAL HIGH (ref 150–450)
RBC: 5.16 x10E6/uL (ref 3.77–5.28)
RDW: 14.2 % (ref 11.7–15.4)
WBC: 13.9 10*3/uL — ABNORMAL HIGH (ref 3.4–10.8)

## 2020-04-10 LAB — HEMOGLOBIN A1C
Est. average glucose Bld gHb Est-mCnc: 126 mg/dL
Hgb A1c MFr Bld: 6 % — ABNORMAL HIGH (ref 4.8–5.6)

## 2020-04-10 LAB — VITAMIN D 25 HYDROXY (VIT D DEFICIENCY, FRACTURES): Vit D, 25-Hydroxy: 58.7 ng/mL (ref 30.0–100.0)

## 2020-04-11 ENCOUNTER — Encounter: Payer: Self-pay | Admitting: Internal Medicine

## 2020-04-11 ENCOUNTER — Other Ambulatory Visit: Payer: Self-pay

## 2020-04-11 ENCOUNTER — Ambulatory Visit
Admission: RE | Admit: 2020-04-11 | Discharge: 2020-04-11 | Disposition: A | Discharge status: Home / Self Care | Payer: 59 | Source: Ambulatory Visit | Attending: Physical Medicine and Rehabilitation | Admitting: Physical Medicine and Rehabilitation

## 2020-04-11 DIAGNOSIS — M545 Low back pain, unspecified: Secondary | ICD-10-CM

## 2020-04-13 ENCOUNTER — Encounter: Payer: Self-pay | Admitting: Internal Medicine

## 2020-04-14 ENCOUNTER — Other Ambulatory Visit: Payer: Self-pay | Admitting: Internal Medicine

## 2020-04-14 DIAGNOSIS — D72829 Elevated white blood cell count, unspecified: Secondary | ICD-10-CM

## 2020-04-20 ENCOUNTER — Encounter: Payer: Self-pay | Admitting: Internal Medicine

## 2020-04-21 ENCOUNTER — Telehealth: Payer: Self-pay | Admitting: *Deleted

## 2020-04-21 NOTE — Telephone Encounter (Signed)
Per referral gave patient upcoming appts - mailed welcome packet with calendar

## 2020-04-23 ENCOUNTER — Ambulatory Visit: Payer: 59 | Admitting: Allergy

## 2020-05-06 ENCOUNTER — Other Ambulatory Visit: Payer: Self-pay | Admitting: Internal Medicine

## 2020-05-06 MED ORDER — AMITRIPTYLINE HCL 10 MG PO TABS: 10.0000 mg | ORAL_TABLET | Freq: Every day | ORAL | 0 refills | Status: DC

## 2020-05-14 ENCOUNTER — Encounter: Payer: Self-pay | Admitting: Allergy

## 2020-05-14 ENCOUNTER — Other Ambulatory Visit: Payer: Self-pay

## 2020-05-14 ENCOUNTER — Ambulatory Visit: Payer: 59 | Admitting: Allergy

## 2020-05-14 VITALS — BP 128/80 | HR 86 | Temp 97.1°F | Resp 18 | Ht 60.0 in | Wt 208.2 lb

## 2020-05-14 DIAGNOSIS — J4540 Moderate persistent asthma, uncomplicated: Secondary | ICD-10-CM

## 2020-05-14 DIAGNOSIS — J3089 Other allergic rhinitis: Secondary | ICD-10-CM

## 2020-05-14 MED ORDER — MONTELUKAST SODIUM 10 MG PO TABS: 10.0000 mg | ORAL_TABLET | Freq: Every day | ORAL | 5 refills | Status: DC

## 2020-05-14 MED ORDER — BREO ELLIPTA 200-25 MCG/INH IN AEPB
1.0000 | INHALATION_SPRAY | Freq: Every day | RESPIRATORY_TRACT | 5 refills | Status: DC
Start: 2020-05-14 — End: 2020-08-24

## 2020-05-14 MED ORDER — ALBUTEROL SULFATE HFA 108 (90 BASE) MCG/ACT IN AERS: 2.0000 | INHALATION_SPRAY | RESPIRATORY_TRACT | 1 refills | Status: DC | PRN

## 2020-05-14 NOTE — Patient Instructions (Addendum)
Asthma - at this time having more symptoms with weather changes - take Breo 1 puff once a day.  This replaces your current inhaler and does appear to be covered by insurance.   - albuterol as needed 2 puffs every 4-6 hours for cough, wheeze, chest tightness, difficulty breathing - continue singulair 10mg  at bedtime daily - your lung function testing today looks great! Asthma control goals:   Full participation in all desired activities (may need albuterol before activity)  Albuterol use two time or less a week on average (not counting use with activity)  Cough interfering with sleep two time or less a month  Oral steroids no more than once a year  No hospitalizations  Allergies - continue singulair as above - can use Allegra 180mg  daily as needed - nasal Atrovent 1-2 sprays each nostril up to 3-4 times a day as needed for nasal drainage or congestion  Recommend getting list of ingredients contained in your joint replacement Follow-up in  4-6 months or sooner if needed

## 2020-05-14 NOTE — Progress Notes (Signed)
Follow-up Note  RE: Olivia Paul MRN: [id number] DOB: 01-19-1956 Date of Office Visit: 05/14/2020   History of present illness: Olivia Paul is a 65 y.o. female presenting today for follow-up of asthma and allergic rhinitis with conjunctivitis.  She was last seen in the office on 10/17/2019 by myself.  She states with the changes in the weather she has felt more short of breath and also having more coughing and has noted some wheezing as well. She states she will use a breathing treatment when she absolutely has too.  She states she has used albuterol via nebulizer several times and has used albuterol inhaler a bit more.  Feels on average since February has used albuterol couple times a month. She states she has not tried the [place] yet.  She still has the sample we provided at last visit.  She does continue to take singular daily.  She has not had any ED or urgent care visits or any systemic steroid needs. She started taking allegra today since the fall.  She is due to the onset of pollen season.  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  She states her pressures continue to be followed by her eye specialist.  She states they did go down and resolved after she stopped taking Flonase.  She does report that she has nasal drainage. She states she continues to have issues with her knee/leg that she had joint replacement in.  She still has some pain and can feel like she feels the joint.  She is going to be getting a second opinion to help determine what can be done at this point in time to get her more relief of symptoms.  She did have patch testing to a piece of the joint for 24-hour placement prior to the surgery.  This was negative for that timeframe.  Review of systems: Review of Systems  Constitutional: Negative.   HENT:       Drainage  Eyes: Negative.   Respiratory:       See HPI  Cardiovascular: Negative.   Gastrointestinal: Negative.    Musculoskeletal: Positive for joint pain.  Skin: Negative.   Neurological: Negative.     All other systems negative unless noted above in HPI  Past medical/social/surgical/family history have been reviewed and are unchanged unless specifically indicated below.  No changes  Medication List: Current Outpatient Medications  Medication Sig Dispense Refill  . albuterol (PROVENTIL) (2.5 MG/3ML) 0.083% nebulizer solution USE 1 VIAL EVERY 4-6 HOURS AS NEEDED FOR COUGH, WHEEZE, SHORTNESS OF BREATH, OR CHEST TIGHTNESS 150 mL 0  . amitriptyline (ELAVIL) 10 MG tablet Take 1 tablet (10 mg total) by mouth at bedtime. 30 tablet 0  . Cholecalciferol (VITAMIN D) 50 MCG (2000 UT) CAPS Take 5,000 Units by mouth daily.     . clonazePAM (KLONOPIN) 0.5 MG tablet TAKE 1 TABLET BY MOUTH TWICE A DAY AS NEEDED 60 tablet 1  . dexlansoprazole (DEXILANT) 60 MG capsule Take 1 capsule (60 mg total) by mouth daily. 90 capsule 1  . DULoxetine (CYMBALTA) 20 MG capsule Take 1 capsule (20 mg total) by mouth daily. 30 capsule 2  . EPINEPHrine 0.3 mg/0.3 mL IJ SOAJ injection Inject 0.3 mLs (0.3 mg total) into the muscle as needed for anaphylaxis. 1 each 1  . fexofenadine (ALLEGRA) 180 MG tablet TAKE ONE TABLET ONCE DAILY FOR RUNNY NOSE OR ITCHING (Patient taking differently: Take 180 mg by mouth daily.)  90 tablet 3  . gabapentin (NEURONTIN) 300 MG capsule daily.     Boris Lown Oil (OMEGA-3) 500 MG CAPS Take 500 mg by mouth daily.    . magnesium oxide (MAG-OX) 400 MG tablet Take 400 mg by mouth daily.    . Menthol-Methyl Salicylate (TIGER BALM LINIMENT EX) Apply 1 application topically at bedtime as needed (pain).    Marland Kitchen oxybutynin (DITROPAN-XL) 10 MG 24 hr tablet Take 10 mg by mouth daily.    Marland Kitchen tiZANidine (ZANAFLEX) 4 MG tablet Take 4 mg by mouth every 8 (eight) hours as needed.    . traMADol (ULTRAM) 50 MG tablet Take 50 mg by mouth every 8 (eight) hours as needed for moderate pain.     . TURMERIC PO Take by mouth.    .  vitamin E 400 UNIT capsule Take 400 Units by mouth daily.    Marland Kitchen albuterol (VENTOLIN HFA) 108 (90 Base) MCG/ACT inhaler Inhale 2 puffs into the lungs every 4 (four) hours as needed for wheezing or shortness of breath. 18 g 1  . fluticasone furoate-vilanterol (BREO ELLIPTA) 200-25 MCG/INH AEPB Inhale 1 puff into the lungs daily. 28 each 5  . montelukast (SINGULAIR) 10 MG tablet Take 1 tablet (10 mg total) by mouth at bedtime. 30 tablet 5   No current facility-administered medications for this visit.     Known medication allergies: Allergies  Allergen Reactions  . Erythromycin Nausea And Vomiting  . Meloxicam     Bruised her legs all over  "Fixed drug eruption"  . Nsaids     Scarring and bruising of legs  . Sulfa Antibiotics     Mouth broke out in sores     Physical examination: Blood pressure 128/80, pulse 86, temperature (!) 97.1 F (36.2 C), resp. rate 18, height 5' (1.524 m), weight 208 lb 3.2 oz (94.4 kg), SpO2 99 %.  General: Alert, interactive, in no acute distress. HEENT: PERRLA, TMs pearly gray, turbinates non-edematous without discharge, post-pharynx non erythematous. Neck: Supple without lymphadenopathy. Lungs: Clear to auscultation without wheezing, rhonchi or rales. {no increased work of breathing. CV: Normal S1, S2 without murmurs. Abdomen: Nondistended, nontender. Skin: Warm and dry, without lesions or rashes. Extremities:  No clubbing, cyanosis or edema. Neuro:   Grossly intact.  Diagnositics/Labs:  Spirometry: FEV1: 1.51L 91%, FVC: 1.61L 76% predicted  Assessment and plan:   Asthma, moderate persistent - at this time having more symptoms with weather changes - take Breo 1 puff once a day.  This replaces your current inhaler and does appear to be covered by insurance.   - albuterol as needed 2 puffs every 4-6 hours for cough, wheeze, chest tightness, difficulty breathing - continue singulair 10mg  at bedtime daily - your lung function testing today  looks great! Asthma control goals:   Full participation in all desired activities (may need albuterol before activity)  Albuterol use two time or less a week on average (not counting use with activity)  Cough interfering with sleep two time or less a month  Oral steroids no more than once a year  No hospitalizations  Allergic rhinitis with conjunctivitis - continue singulair as above - can use Allegra 180mg  daily as needed - nasal Atrovent 1-2 sprays each nostril up to 3-4 times a day as needed for nasal drainage or congestion  Recommend getting list of ingredients contained in your joint replacement Follow-up in  4-6 months or sooner if needed  I appreciate the opportunity to take part in Mahathi's care.  Please do not hesitate to contact me with questions.  Sincerely,   Prudy Feeler, MD Allergy/Immunology [facility]

## 2020-05-15 ENCOUNTER — Other Ambulatory Visit: Payer: Self-pay | Admitting: Family

## 2020-05-15 DIAGNOSIS — D72829 Elevated white blood cell count, unspecified: Secondary | ICD-10-CM

## 2020-05-18 ENCOUNTER — Inpatient Hospital Stay: Payer: 59

## 2020-05-18 ENCOUNTER — Encounter: Payer: Self-pay | Admitting: Family

## 2020-05-18 ENCOUNTER — Telehealth: Payer: Self-pay | Admitting: Family

## 2020-05-18 ENCOUNTER — Other Ambulatory Visit: Payer: Self-pay

## 2020-05-18 ENCOUNTER — Telehealth: Payer: Self-pay

## 2020-05-18 ENCOUNTER — Inpatient Hospital Stay: Payer: 59 | Attending: Family | Admitting: Family

## 2020-05-18 VITALS — BP 125/80 | HR 79 | Temp 98.5°F | Resp 18 | Ht 60.0 in | Wt 205.4 lb

## 2020-05-18 DIAGNOSIS — R5383 Other fatigue: Secondary | ICD-10-CM | POA: Diagnosis not present

## 2020-05-18 DIAGNOSIS — Z803 Family history of malignant neoplasm of breast: Secondary | ICD-10-CM | POA: Insufficient documentation

## 2020-05-18 DIAGNOSIS — J45901 Unspecified asthma with (acute) exacerbation: Secondary | ICD-10-CM | POA: Insufficient documentation

## 2020-05-18 DIAGNOSIS — M1712 Unilateral primary osteoarthritis, left knee: Secondary | ICD-10-CM | POA: Insufficient documentation

## 2020-05-18 DIAGNOSIS — D72829 Elevated white blood cell count, unspecified: Secondary | ICD-10-CM | POA: Diagnosis not present

## 2020-05-18 DIAGNOSIS — Z79899 Other long term (current) drug therapy: Secondary | ICD-10-CM | POA: Diagnosis not present

## 2020-05-18 LAB — CBC WITH DIFFERENTIAL (CANCER CENTER ONLY)
Abs Immature Granulocytes: 0.03 10*3/uL (ref 0.00–0.07)
Basophils Absolute: 0.1 10*3/uL (ref 0.0–0.1)
Basophils Relative: 1 %
Eosinophils Absolute: 0.2 10*3/uL (ref 0.0–0.5)
Eosinophils Relative: 1 %
HCT: 43.5 % (ref 36.0–46.0)
Hemoglobin: 13.6 g/dL (ref 12.0–15.0)
Immature Granulocytes: 0 %
Lymphocytes Relative: 34 %
Lymphs Abs: 3.6 10*3/uL (ref 0.7–4.0)
MCH: 27.4 pg (ref 26.0–34.0)
MCHC: 31.3 g/dL (ref 30.0–36.0)
MCV: 87.5 fL (ref 80.0–100.0)
Monocytes Absolute: 0.8 10*3/uL (ref 0.1–1.0)
Monocytes Relative: 8 %
Neutro Abs: 6 10*3/uL (ref 1.7–7.7)
Neutrophils Relative %: 56 %
Platelet Count: 392 10*3/uL (ref 150–400)
RBC: 4.97 MIL/uL (ref 3.87–5.11)
RDW: 15.3 % (ref 11.5–15.5)
WBC Count: 10.6 10*3/uL — ABNORMAL HIGH (ref 4.0–10.5)
nRBC: 0 % (ref 0.0–0.2)

## 2020-05-18 LAB — CMP (CANCER CENTER ONLY)
ALT: 10 U/L (ref 0–44)
AST: 12 U/L — ABNORMAL LOW (ref 15–41)
Albumin: 4.1 g/dL (ref 3.5–5.0)
Alkaline Phosphatase: 104 U/L (ref 38–126)
Anion gap: 10 (ref 5–15)
BUN: 15 mg/dL (ref 8–23)
CO2: 30 mmol/L (ref 22–32)
Calcium: 10 mg/dL (ref 8.9–10.3)
Chloride: 106 mmol/L (ref 98–111)
Creatinine: 0.85 mg/dL (ref 0.44–1.00)
GFR, Estimated: >60 mL/min (ref >60)
Glucose, Bld: 96 mg/dL (ref 70–99)
Potassium: 4.5 mmol/L (ref 3.5–5.1)
Sodium: 146 mmol/L — ABNORMAL HIGH (ref 135–145)
Total Bilirubin: 0.5 mg/dL (ref 0.3–1.2)
Total Protein: 7.6 g/dL (ref 6.5–8.1)

## 2020-05-18 LAB — SAVE SMEAR(SSMR), FOR PROVIDER SLIDE REVIEW

## 2020-05-18 LAB — LACTATE DEHYDROGENASE: LDH: 161 U/L (ref 98–192)
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
## 2020-05-18 NOTE — Telephone Encounter (Signed)
05/18/20 LOS states   Released back to PCP. No follow-up needed.

## 2020-05-18 NOTE — Telephone Encounter (Signed)
Released back to PCP. No follow-up needed. Per 4/4 los

## 2020-06-01 ENCOUNTER — Other Ambulatory Visit: Payer: Self-pay | Admitting: Internal Medicine

## 2020-06-04 ENCOUNTER — Encounter: Payer: Self-pay | Admitting: Internal Medicine

## 2020-06-05 ENCOUNTER — Other Ambulatory Visit: Payer: Self-pay | Admitting: Internal Medicine

## 2020-06-05 MED ORDER — DULOXETINE HCL 30 MG PO CPEP: 30.0000 mg | ORAL_CAPSULE | Freq: Every day | ORAL | 2 refills | Status: DC

## 2020-06-08 ENCOUNTER — Encounter: Payer: Self-pay | Admitting: Internal Medicine

## 2020-06-09 ENCOUNTER — Telehealth: Payer: Self-pay

## 2020-06-09 NOTE — Telephone Encounter (Signed)
The pt was scheduled an appt for 4 wks f/u for duloxetine.

## 2020-06-11 ENCOUNTER — Encounter: Payer: Self-pay | Admitting: Internal Medicine

## 2020-06-11 ENCOUNTER — Telehealth: Payer: Self-pay

## 2020-06-11 NOTE — Telephone Encounter (Signed)
error 

## 2020-06-12 ENCOUNTER — Encounter: Payer: Self-pay | Admitting: Internal Medicine

## 2020-06-16 ENCOUNTER — Telehealth: Payer: Self-pay

## 2020-06-16 NOTE — Telephone Encounter (Signed)
The pt was notified that her requested letter is ready for pickup.

## 2020-07-07 ENCOUNTER — Encounter: Payer: Self-pay | Admitting: Internal Medicine

## 2020-07-21 ENCOUNTER — Ambulatory Visit: Payer: 59 | Admitting: Internal Medicine

## 2020-07-21 ENCOUNTER — Encounter: Payer: Self-pay | Admitting: Internal Medicine

## 2020-07-21 ENCOUNTER — Other Ambulatory Visit: Payer: Self-pay

## 2020-07-21 VITALS — Temp 98.3°F | Ht 60.0 in | Wt 208.2 lb

## 2020-07-21 DIAGNOSIS — Z6839 Body mass index (BMI) 39.0-39.9, adult: Secondary | ICD-10-CM

## 2020-07-21 DIAGNOSIS — F419 Anxiety disorder, unspecified: Secondary | ICD-10-CM

## 2020-07-21 DIAGNOSIS — Z01818 Encounter for other preprocedural examination: Secondary | ICD-10-CM | POA: Diagnosis not present

## 2020-07-21 DIAGNOSIS — R42 Dizziness and giddiness: Secondary | ICD-10-CM | POA: Diagnosis not present

## 2020-07-21 DIAGNOSIS — Z8249 Family history of ischemic heart disease and other diseases of the circulatory system: Secondary | ICD-10-CM

## 2020-07-21 DIAGNOSIS — R030 Elevated blood-pressure reading, without diagnosis of hypertension: Secondary | ICD-10-CM

## 2020-07-21 MED ORDER — MECLIZINE HCL 50 MG PO TABS: ORAL_TABLET | ORAL | 0 refills | Status: DC

## 2020-07-21 NOTE — Progress Notes (Signed)
I,Katawbba Wiggins,acting as a Neurosurgeon for Gwynneth Aliment, MD.,have documented all relevant documentation on the behalf of Gwynneth Aliment, MD,as directed by  Gwynneth Aliment, MD while in the presence of Gwynneth Aliment, MD.  This visit occurred during the SARS-CoV-2 public health emergency.  Safety protocols were in place, including screening questions prior to the visit, additional usage of staff PPE, and extensive cleaning of exam room while observing appropriate contact time as indicated for disinfecting solutions.  Subjective:     Patient ID: Olivia Paul , female    DOB: 10-12-1955 , 65 y.o.   MRN: [id number]   Chief Complaint  Patient presents with   Anxiety   surgical clearance    HPI  She is here today for f/u anxiety. She reports she takes clonazepam only if she drives. This week, she had to work in [place], so she has taken daily.   Also, she needs pre-op eval for revision of left knee replacement, due to moderate-severe osteoarthritis. She is scheduled for surgery on July 17th. She does have a flight of stairs in her home. She states it is 17 stairs. She is able to walk upstairs without chest pain and shortness of breath. She now states mowing the grass makes her fatigued. She was evaluated by Cardiology in Dec 2021 for evaluation of chest pain. Workup was negative for ischemia.   Anxiety Presents for follow-up visit. Symptoms include dizziness and panic. Patient reports no chest pain, compulsions, decreased concentration, depressed mood, feeling of choking, muscle tension or palpitations.      Past Medical History:  Diagnosis Date   Arthritis    lt knee   Asthma 2011   Complication of anesthesia    Headache    sinus   PONV (postoperative nausea and vomiting)    Pre-diabetes    Recurrent upper respiratory infection (URI)      Family History  Problem Relation Age of Onset   Allergic rhinitis Mother    Allergic rhinitis Father    Asthma Father    Urticaria  Sister    Breast cancer Maternal Aunt    Breast cancer Paternal Aunt    Breast cancer Paternal Grandmother    Angioedema Neg Hx    Atopy Neg Hx    Eczema Neg Hx    Immunodeficiency Neg Hx      Current Outpatient Medications:    albuterol (PROVENTIL) (2.5 MG/3ML) 0.083% nebulizer solution, USE 1 VIAL EVERY 4-6 HOURS AS NEEDED FOR COUGH, WHEEZE, SHORTNESS OF BREATH, OR CHEST TIGHTNESS, Disp: 150 mL, Rfl: 0   albuterol (VENTOLIN HFA) 108 (90 Base) MCG/ACT inhaler, Inhale 2 puffs into the lungs every 4 (four) hours as needed for wheezing or shortness of breath., Disp: 18 g, Rfl: 1   amitriptyline (ELAVIL) 10 MG tablet, TAKE 1 TABLET BY MOUTH EVERYDAY AT BEDTIME, Disp: 30 tablet, Rfl: 0   Cholecalciferol (VITAMIN D) 50 MCG (2000 UT) CAPS, Take 5,000 Units by mouth daily. , Disp: , Rfl:    clonazePAM (KLONOPIN) 0.5 MG tablet, TAKE 1 TABLET BY MOUTH TWICE A DAY AS NEEDED, Disp: 60 tablet, Rfl: 1   dexlansoprazole (DEXILANT) 60 MG capsule, Take 1 capsule (60 mg total) by mouth daily., Disp: 90 capsule, Rfl: 1   DULoxetine (CYMBALTA) 30 MG capsule, Take 1 capsule (30 mg total) by mouth daily., Disp: 30 capsule, Rfl: 2   fexofenadine (ALLEGRA) 180 MG tablet, TAKE ONE TABLET ONCE DAILY FOR RUNNY NOSE OR ITCHING (Patient taking differently: Take  180 mg by mouth daily.), Disp: 90 tablet, Rfl: 3   fluticasone furoate-vilanterol (BREO ELLIPTA) 200-25 MCG/INH AEPB, Inhale 1 puff into the lungs daily., Disp: 28 each, Rfl: 5   gabapentin (NEURONTIN) 300 MG capsule, daily. , Disp: , Rfl:    Krill Oil (OMEGA-3) 500 MG CAPS, Take 500 mg by mouth daily., Disp: , Rfl:    magnesium oxide (MAG-OX) 400 MG tablet, Take 400 mg by mouth daily., Disp: , Rfl:    meclizine (ANTIVERT) 50 MG tablet, Take 1/2 tab po qpm prn, Disp: 30 tablet, Rfl: 0   Menthol-Methyl Salicylate (TIGER BALM LINIMENT EX), Apply 1 application topically at bedtime as needed (pain)., Disp: , Rfl:    montelukast (SINGULAIR) 10 MG tablet, Take 1  tablet (10 mg total) by mouth at bedtime., Disp: 30 tablet, Rfl: 5   oxybutynin (DITROPAN-XL) 10 MG 24 hr tablet, Take 10 mg by mouth daily., Disp: , Rfl:    tiZANidine (ZANAFLEX) 4 MG tablet, Take 4 mg by mouth every 8 (eight) hours as needed., Disp: , Rfl:    traMADol (ULTRAM) 50 MG tablet, Take 50 mg by mouth every 8 (eight) hours as needed for moderate pain. , Disp: , Rfl:    TURMERIC PO, Take by mouth., Disp: , Rfl:    vitamin E 400 UNIT capsule, Take 400 Units by mouth daily., Disp: , Rfl:    EPINEPHrine 0.3 mg/0.3 mL IJ SOAJ injection, Inject 0.3 mLs (0.3 mg total) into the muscle as needed for anaphylaxis. (Patient not taking: No sig reported), Disp: 1 each, Rfl: 1   Allergies  Allergen Reactions   Meloxicam Other (See Comments)    Bruised her legs all over     Nsaids Other (See Comments)    Scarring and bruising of legs   Sulfa Antibiotics Other (See Comments)    Mouth broke out in sores   Erythromycin Nausea And Vomiting     Review of Systems  Constitutional: Negative.   Respiratory: Negative.    Cardiovascular:  Negative for chest pain and palpitations.  Gastrointestinal: Negative.   Neurological:  Positive for dizziness.       Had episode of room spinning. No recent URI. Not sure what could have triggered her sx. No associated n/v/cp.   Psychiatric/Behavioral: Negative.  Negative for decreased concentration.   All other systems reviewed and are negative.   Today's Vitals   07/21/20 0950  Temp: 98.3 F (36.8 C)  TempSrc: Oral  Weight: 208 lb 3.2 oz (94.4 kg)  Height: 5' (1.524 m)   Body mass index is 40.66 kg/m.  Wt Readings from Last 3 Encounters:  07/21/20 208 lb 3.2 oz (94.4 kg)  05/18/20 205 lb 6.4 oz (93.2 kg)  05/14/20 208 lb 3.2 oz (94.4 kg)   BP Readings from Last 3 Encounters:  05/18/20 125/80  05/14/20 128/80  04/09/20 118/82   Objective:  Physical Exam Vitals and nursing note reviewed.  Constitutional:      Appearance: Normal appearance. She  is obese.  HENT:     Head: Normocephalic and atraumatic.     Nose:     Comments: Masked     Mouth/Throat:     Comments: Masked  Cardiovascular:     Rate and Rhythm: Normal rate and regular rhythm.     Heart sounds: Normal heart sounds.  Pulmonary:     Effort: Pulmonary effort is normal.     Breath sounds: Normal breath sounds.  Musculoskeletal:     Cervical back: Normal  range of motion.  Skin:    General: Skin is warm.  Neurological:     General: No focal deficit present.     Mental Status: She is alert.  Psychiatric:        Mood and Affect: Mood normal.        Behavior: Behavior normal.        Assessment And Plan:     1. Anxiety Comments: Chronic, she will c/w clonazepam prn.   2. Pre-op evaluation Comments: EKG performed, NSR w/ LAE - no acute changes noted. Echo May 2021 w/ nl systolic function, no WMA. She may proceed with revision of left knee replacement with acceptable risk.  - EKG 12-Lead  3. Dizziness Comments: Sx are suggestive of vertigo. I will send rx meclizine prn. EKG performed, NSR w/ LAE. Encouraged to let me know ASAP should her sx persist.  - EKG 12-Lead  4. Class 2 severe obesity due to excess calories with serious comorbidity and body mass index (BMI) of 39.0 to 39.9 in adult [facility]) Comments: She is encouraged to strive to lose ten percent of her body weight to decrease cardiac risk.   5. Family history of heart disease  Patient was given opportunity to ask questions. Patient verbalized understanding of the plan and was able to repeat key elements of the plan. All questions were answered to their satisfaction.   I, Gwynneth Aliment, MD, have reviewed all documentation for this visit. The documentation on 07/21/20 for the exam, diagnosis, procedures, and orders are all accurate and complete.   IF YOU HAVE BEEN REFERRED TO A SPECIALIST, IT MAY TAKE 1-2 WEEKS TO SCHEDULE/PROCESS THE REFERRAL. IF YOU HAVE NOT HEARD FROM US/SPECIALIST IN TWO WEEKS, PLEASE  GIVE [place] A CALL AT [phone].   THE PATIENT IS ENCOURAGED TO PRACTICE SOCIAL DISTANCING DUE TO THE COVID-19 PANDEMIC.

## 2020-07-21 NOTE — Patient Instructions (Signed)
[email or web address]">  Generalized Anxiety Disorder, Adult Generalized anxiety disorder (GAD) is a mental health condition. Unlike normal worries, anxiety related to GAD is not triggered by a specific event. These worries do not fade or get better with time. GAD interferes with relationships, work, and school. GAD symptoms can vary from mild to severe. People with severe GAD can have intense waves of anxiety with physical symptoms that are similar to panic attacks. What are the causes? The exact cause of GAD is not known, but the following are believed to have an impact:  Differences in natural brain chemicals.  Genes passed down from parents to children.  Differences in the way threats are perceived.  Development during childhood.  Personality. What increases the risk? The following factors may make you more likely to develop this condition:  Being female.  Having a family history of anxiety disorders.  Being very shy.  Experiencing very stressful life events, such as the death of a loved one.  Having a very stressful family environment. What are the signs or symptoms? People with GAD often worry excessively about many things in their lives, such as their health and family. Symptoms may also include:  Mental and emotional symptoms: ? Worrying excessively about natural disasters. ? Fear of being late. ? Difficulty concentrating. ? Fears that others are judging your performance.  Physical symptoms: ? Fatigue. ? Headaches, muscle tension, muscle twitches, trembling, or feeling shaky. ? Feeling like your heart is pounding or beating very fast. ? Feeling out of breath or like you cannot take a deep breath. ? Having trouble falling asleep or staying asleep, or experiencing restlessness. ? Sweating. ? Nausea, diarrhea, or irritable bowel syndrome (IBS).  Behavioral symptoms: ? Experiencing erratic moods or irritability. ? Avoidance of new situations. ? Avoidance of  people. ? Extreme difficulty making decisions. How is this diagnosed? This condition is diagnosed based on your symptoms and medical history. You will also have a physical exam. Your health care provider may perform tests to rule out other possible causes of your symptoms. To be diagnosed with GAD, a person must have anxiety that:  Is out of his or her control.  Affects several different aspects of his or her life, such as work and relationships.  Causes distress that makes him or her unable to take part in normal activities.  Includes at least three symptoms of GAD, such as restlessness, fatigue, trouble concentrating, irritability, muscle tension, or sleep problems. Before your health care provider can confirm a diagnosis of GAD, these symptoms must be present more days than they are not, and they must last for 6 months or longer. How is this treated? This condition may be treated with:  Medicine. Antidepressant medicine is usually prescribed for long-term daily control. Anti-anxiety medicines may be added in severe cases, especially when panic attacks occur.  Talk therapy (psychotherapy). Certain types of talk therapy can be helpful in treating GAD by providing support, education, and guidance. Options include: ? Cognitive behavioral therapy (CBT). People learn coping skills and self-calming techniques to ease their physical symptoms. They learn to identify unrealistic thoughts and behaviors and to replace them with more appropriate thoughts and behaviors. ? Acceptance and commitment therapy (ACT). This treatment teaches people how to be mindful as a way to cope with unwanted thoughts and feelings. ? Biofeedback. This process trains you to manage your body's response (physiological response) through breathing techniques and relaxation methods. You will work with a therapist while machines are used to monitor your physical   symptoms.  Stress management techniques. These include yoga,  meditation, and exercise. A mental health specialist can help determine which treatment is best for you. Some people see improvement with one type of therapy. However, other people require a combination of therapies.   Follow these instructions at home: Lifestyle  Maintain a consistent routine and schedule.  Anticipate stressful situations. Create a plan, and allow extra time to work with your plan.  Practice stress management or self-calming techniques that you have learned from your therapist or your health care provider. General instructions  Take over-the-counter and prescription medicines only as told by your health care provider.  Understand that you are likely to have setbacks. Accept this and be kind to yourself as you persist to take better care of yourself.  Recognize and accept your accomplishments, even if you judge them as small.  Keep all follow-up visits as told by your health care provider. This is important. Contact a health care provider if:  Your symptoms do not get better.  Your symptoms get worse.  You have signs of depression, such as: ? A persistently sad or irritable mood. ? Loss of enjoyment in activities that used to bring you joy. ? Change in weight or eating. ? Changes in sleeping habits. ? Avoiding friends or family members. ? Loss of energy for normal tasks. ? Feelings of guilt or worthlessness. Get help right away if:  You have serious thoughts about hurting yourself or others. If you ever feel like you may hurt yourself or others, or have thoughts about taking your own life, get help right away. Go to your nearest emergency department or:  Call your local emergency services (911 in the [place]  Call a suicide crisis helpline, such as the National Suicide Prevention Lifeline at 1-800-273-8255. This is open 24 hours a day in the [place]  Text the Crisis Text Line at 741741 (in the [place] Summary  Generalized anxiety disorder (GAD) is a mental  health condition that involves worry that is not triggered by a specific event.  People with GAD often worry excessively about many things in their lives, such as their health and family.  GAD may cause symptoms such as restlessness, trouble concentrating, sleep problems, frequent sweating, nausea, diarrhea, headaches, and trembling or muscle twitching.  A mental health specialist can help determine which treatment is best for you. Some people see improvement with one type of therapy. However, other people require a combination of therapies. This information is not intended to replace advice given to you by your health care provider. Make sure you discuss any questions you have with your health care provider. Document Revised: 11/21/2018 Document Reviewed: 11/21/2018 Elsevier Patient Education  2021 Elsevier Inc.  

## 2020-08-02 ENCOUNTER — Encounter: Payer: Self-pay | Admitting: Internal Medicine

## 2020-08-02 DIAGNOSIS — Z8249 Family history of ischemic heart disease and other diseases of the circulatory system: Secondary | ICD-10-CM | POA: Insufficient documentation

## 2020-08-02 DIAGNOSIS — E66813 Obesity, class 3: Secondary | ICD-10-CM | POA: Insufficient documentation

## 2020-08-02 DIAGNOSIS — Z6839 Body mass index (BMI) 39.0-39.9, adult: Secondary | ICD-10-CM | POA: Insufficient documentation

## 2020-08-02 DIAGNOSIS — R42 Dizziness and giddiness: Secondary | ICD-10-CM | POA: Insufficient documentation

## 2020-08-12 ENCOUNTER — Encounter: Payer: Self-pay | Admitting: Internal Medicine

## 2020-08-22 ENCOUNTER — Other Ambulatory Visit: Payer: Self-pay | Admitting: Allergy

## 2020-08-24 ENCOUNTER — Other Ambulatory Visit: Payer: Self-pay

## 2020-08-24 ENCOUNTER — Other Ambulatory Visit: Payer: Self-pay | Admitting: Internal Medicine

## 2020-08-24 ENCOUNTER — Telehealth: Payer: Self-pay

## 2020-08-24 MED ORDER — DULOXETINE HCL 30 MG PO CPEP: 30.0000 mg | ORAL_CAPSULE | Freq: Every day | ORAL | 2 refills | Status: DC

## 2020-08-24 NOTE — Telephone Encounter (Signed)
Patient called with complaints of increased anxiety. Patient will increase cymbalta to 60mg  on 09/07/20

## 2020-08-31 HISTORY — PX: OTHER SURGICAL HISTORY: SHX169

## 2020-09-21 ENCOUNTER — Other Ambulatory Visit: Payer: Self-pay

## 2020-09-21 ENCOUNTER — Telehealth: Payer: Self-pay

## 2020-09-21 MED ORDER — DULOXETINE HCL 60 MG PO CPEP: 60.0000 mg | ORAL_CAPSULE | Freq: Every day | ORAL | 1 refills | Status: DC

## 2020-09-21 NOTE — Telephone Encounter (Signed)
I left the pt a message that Dr. Allyne Gee wanted to check and see how the pt is feeling, if the cymbalta is working for her and when is her knee surgery date.

## 2020-10-06 ENCOUNTER — Other Ambulatory Visit: Payer: Self-pay

## 2020-10-06 ENCOUNTER — Encounter: Payer: Self-pay | Admitting: Internal Medicine

## 2020-10-06 ENCOUNTER — Ambulatory Visit: Payer: 59 | Admitting: Internal Medicine

## 2020-10-06 VITALS — BP 118/80 | HR 82 | Temp 99.0°F | Ht 60.0 in | Wt 200.6 lb

## 2020-10-06 DIAGNOSIS — G8929 Other chronic pain: Secondary | ICD-10-CM

## 2020-10-06 DIAGNOSIS — F419 Anxiety disorder, unspecified: Secondary | ICD-10-CM | POA: Diagnosis not present

## 2020-10-06 DIAGNOSIS — M545 Low back pain, unspecified: Secondary | ICD-10-CM | POA: Diagnosis not present

## 2020-10-06 DIAGNOSIS — M542 Cervicalgia: Secondary | ICD-10-CM

## 2020-10-06 DIAGNOSIS — R232 Flushing: Secondary | ICD-10-CM

## 2020-10-06 DIAGNOSIS — M25562 Pain in left knee: Secondary | ICD-10-CM | POA: Diagnosis not present

## 2020-10-06 MED ORDER — DULOXETINE HCL 60 MG PO CPEP: 60.0000 mg | ORAL_CAPSULE | Freq: Every day | ORAL | 1 refills | Status: DC

## 2020-10-06 NOTE — Progress Notes (Signed)
I,Tianna Badgett,acting as a Neurosurgeon for Gwynneth Aliment, MD.,have documented all relevant documentation on the behalf of Gwynneth Aliment, MD,as directed by  Gwynneth Aliment, MD while in the presence of Gwynneth Aliment, MD.  This visit occurred during the SARS-CoV-2 public health emergency.  Safety protocols were in place, including screening questions prior to the visit, additional usage of staff PPE, and extensive cleaning of exam room while observing appropriate contact time as indicated for disinfecting solutions.  Subjective:     Patient ID: Olivia Paul , female    DOB: [birth year] , 66 y.o.   MRN: [id number]   Chief Complaint  Patient presents with   Anxiety    HPI  She is here today for f/u anxiety. The dose of duloxetine was increased at her last visit, to 60mg . She has not had any issues with the medication. She reports her symptoms of driving anxiety have greatly improved. She has noticed that she requires less clonazepam. She is happy to report she has even been on the highway!   She is s/p revision of left TKR in July. She has been actively engaged in PT. She has been recently evaluated by spine specialist for chronic neck/back pain.     Past Medical History:  Diagnosis Date   Arthritis    lt knee   Asthma 2011   Complication of anesthesia    Headache    sinus   PONV (postoperative nausea and vomiting)    Pre-diabetes    Recurrent upper respiratory infection (URI)      Family History  Problem Relation Age of Onset   Allergic rhinitis Mother    Allergic rhinitis Father    Asthma Father    Urticaria Sister    Breast cancer Maternal Aunt    Breast cancer Paternal Aunt    Breast cancer Paternal Grandmother    Angioedema Neg Hx    Atopy Neg Hx    Eczema Neg Hx    Immunodeficiency Neg Hx      Current Outpatient Medications:    albuterol (PROVENTIL) (2.5 MG/3ML) 0.083% nebulizer solution, USE 1 VIAL EVERY 4-6 HOURS AS NEEDED FOR COUGH, WHEEZE, SHORTNESS OF  BREATH, OR CHEST TIGHTNESS, Disp: 150 mL, Rfl: 0   albuterol (VENTOLIN HFA) 108 (90 Base) MCG/ACT inhaler, Inhale 2 puffs into the lungs every 4 (four) hours as needed for wheezing or shortness of breath., Disp: 18 g, Rfl: 1   Cholecalciferol (VITAMIN D) 50 MCG (2000 UT) CAPS, Take 5,000 Units by mouth daily. , Disp: , Rfl:    clonazePAM (KLONOPIN) 0.5 MG tablet, TAKE 1 TABLET BY MOUTH TWICE A DAY AS NEEDED, Disp: 60 tablet, Rfl: 1   dexlansoprazole (DEXILANT) 60 MG capsule, Take 1 capsule (60 mg total) by mouth daily., Disp: 90 capsule, Rfl: 1   EPINEPHrine 0.3 mg/0.3 mL IJ SOAJ injection, Inject 0.3 mLs (0.3 mg total) into the muscle as needed for anaphylaxis., Disp: 1 each, Rfl: 1   fexofenadine (ALLEGRA) 180 MG tablet, TAKE ONE TABLET ONCE DAILY FOR RUNNY NOSE OR ITCHING (Patient taking differently: Take 180 mg by mouth daily.), Disp: 90 tablet, Rfl: 3   fluticasone furoate-vilanterol (BREO ELLIPTA) 200-25 MCG/INH AEPB, Breo Ellipta 200 mcg-25 mcg/dose powder for inhalation  TAKE 1 PUFF BY MOUTH EVERY DAY, Disp: , Rfl:    gabapentin (NEURONTIN) 300 MG capsule, daily. , Disp: , Rfl:    Krill Oil (OMEGA-3) 500 MG CAPS, Take 500 mg by mouth daily., Disp: , Rfl:  magnesium oxide (MAG-OX) 400 MG tablet, Take 400 mg by mouth daily., Disp: , Rfl:    meclizine (ANTIVERT) 50 MG tablet, Take 1/2 tab po qpm prn, Disp: 30 tablet, Rfl: 0   Menthol-Methyl Salicylate (TIGER BALM LINIMENT EX), Apply 1 application topically at bedtime as needed (pain)., Disp: , Rfl:    montelukast (SINGULAIR) 10 MG tablet, Take 1 tablet (10 mg total) by mouth at bedtime., Disp: 30 tablet, Rfl: 5   oxybutynin (DITROPAN-XL) 10 MG 24 hr tablet, Take 10 mg by mouth daily., Disp: , Rfl:    tiZANidine (ZANAFLEX) 4 MG tablet, Take 4 mg by mouth every 8 (eight) hours as needed., Disp: , Rfl:    traMADol (ULTRAM) 50 MG tablet, Take 50 mg by mouth every 8 (eight) hours as needed for moderate pain. , Disp: , Rfl:    TURMERIC PO, Take by  mouth., Disp: , Rfl:    vitamin E 400 UNIT capsule, Take 400 Units by mouth daily., Disp: , Rfl:    DULoxetine (CYMBALTA) 60 MG capsule, Take 1 capsule (60 mg total) by mouth daily., Disp: 90 capsule, Rfl: 1   Allergies  Allergen Reactions   Meloxicam Other (See Comments)    Bruised her legs all over     Nsaids Other (See Comments)    Scarring and bruising of legs   Sulfa Antibiotics Other (See Comments)    Mouth broke out in sores   Oxycodone    Erythromycin Nausea And Vomiting     Review of Systems  Constitutional: Negative.   Respiratory: Negative.    Cardiovascular: Negative.   Gastrointestinal: Negative.   Endocrine:       She c/o hot/cold flashes. Not sure what is precipitating her sx. Does not have night sweats. States she had URI sx about a week ago, they have since resolved. No urinary sx.   Musculoskeletal:  Positive for arthralgias.  Neurological: Negative.     Today's Vitals   10/06/20 1150  BP: 118/80  Pulse: 82  Temp: 99 F (37.2 C)  TempSrc: Oral  Weight: 200 lb 9.6 oz (91 kg)  Height: 5' (1.524 m)   Body mass index is 39.18 kg/m.   Objective:  Physical Exam Vitals and nursing note reviewed.  Constitutional:      Appearance: Normal appearance.  HENT:     Head: Normocephalic and atraumatic.     Nose:     Comments: Masked     Mouth/Throat:     Comments: Masked  Eyes:     Extraocular Movements: Extraocular movements intact.  Cardiovascular:     Rate and Rhythm: Normal rate and regular rhythm.     Heart sounds: Normal heart sounds.  Pulmonary:     Effort: Pulmonary effort is normal.     Breath sounds: Normal breath sounds.  Musculoskeletal:     Cervical back: Normal range of motion.     Comments: Healing surgical scar on left knee. No erythema/drainage  Skin:    General: Skin is warm.  Neurological:     General: No focal deficit present.     Mental Status: She is alert.  Psychiatric:        Mood and Affect: Mood normal.        Behavior:  Behavior normal.        Assessment And Plan:     1. Anxiety Comments: Much improved on duloxetine 60mg  daily. She was congratulated on her progress thus far. She agrees to f/u in 3 months.  2. Cervicalgia Comments: As per Dr. Shon Baton. MRI cervical spine was reviewed in full detail during her visit.   3. Chronic midline low back pain without sciatica Comments: Please see above. Again, MRI lumbar spine results reviewed in full detail.   4. Chronic pain of left knee Comments: Advised to stop tramadol for now (due to concomitant use of duloxetine). She agrees to take hydrocodone solely for now.   5. Hot flashes Comments:  I will check thyroid function.  - TSH - CBC no Diff    Patient was given opportunity to ask questions. Patient verbalized understanding of the plan and was able to repeat key elements of the plan. All questions were answered to their satisfaction.   I, Gwynneth Aliment, MD, have reviewed all documentation for this visit. The documentation on 10/06/20 for the exam, diagnosis, procedures, and orders are all accurate and complete.   IF YOU HAVE BEEN REFERRED TO A SPECIALIST, IT MAY TAKE 1-2 WEEKS TO SCHEDULE/PROCESS THE REFERRAL. IF YOU HAVE NOT HEARD FROM US/SPECIALIST IN TWO WEEKS, PLEASE GIVE [place] A CALL AT [phone].   THE PATIENT IS ENCOURAGED TO PRACTICE SOCIAL DISTANCING DUE TO THE COVID-19 PANDEMIC.

## 2020-10-06 NOTE — Patient Instructions (Signed)
https://[email or web address]">  Chronic Back Pain When back pain lasts longer than 3 months, it is called chronic back pain. The cause of your back pain may not be known. Some common causes include: Wear and tear (degenerative disease) of the bones, ligaments, or disks in your back. Inflammation and stiffness in your back (arthritis). People who have chronic back pain often go through certain periods in which the pain is more intense (flare-ups). Many people can learn to manage the pain with home care. Follow these instructions at home: Pay attention to any changes in your symptoms. Take these actions to help withyour pain: Managing pain and stiffness     If directed, apply ice to the painful area. Your health care provider may recommend applying ice during the first 24-48 hours after a flare-up begins. To do this: Put ice in a plastic bag. Place a towel between your skin and the bag. Leave the ice on for 20 minutes, 2-3 times per day. If directed, apply heat to the affected area as often as told by your health care provider. Use the heat source that your health care provider recommends, such as a moist heat pack or a heating pad. Place a towel between your skin and the heat source. Leave the heat on for 20-30 minutes. Remove the heat if your skin turns bright red. This is especially important if you are unable to feel pain, heat, or cold. You may have a greater risk of getting burned. Try soaking in a warm tub. Activity  Avoid bending and other activities that make the problem worse. Maintain a proper position when standing or sitting: When standing, keep your upper back and neck straight, with your shoulders pulled back. Avoid slouching. When sitting, keep your back straight and relax your shoulders. Do not round your shoulders or pull them backward. Do not sit or stand in one place for long periods of time. Take brief periods of rest throughout the day. This will reduce your  pain. Resting in a lying or standing position is usually better than sitting to rest. When you are resting for longer periods, mix in some mild activity or stretching between periods of rest. This will help to prevent stiffness and pain. Get regular exercise. Ask your health care provider what activities are safe for you. Do not lift anything that is heavier than 10 lb (4.5 kg), or the limit that you are told, until your health care provider says that it is safe. Always use proper lifting technique, which includes: Bending your knees. Keeping the load close to your body. Avoiding twisting. Sleep on a firm mattress in a comfortable position. Try lying on your side with your knees slightly bent. If you lie on your back, put a pillow under your knees.  Medicines Treatment may include medicines for pain and inflammation taken by mouth or applied to the skin, prescription pain medicine, or muscle relaxants. Take over-the-counter and prescription medicines only as told by your health care provider. Ask your health care provider if the medicine prescribed to you: Requires you to avoid driving or using machinery. Can cause constipation. You may need to take these actions to prevent or treat constipation: Drink enough fluid to keep your urine pale yellow. Take over-the-counter or prescription medicines. Eat foods that are high in fiber, such as beans, whole grains, and fresh fruits and vegetables. Limit foods that are high in fat and processed sugars, such as fried or sweet foods. General instructions Do not use any products that contain   nicotine or tobacco, such as cigarettes, e-cigarettes, and chewing tobacco. If you need help quitting, ask your health care provider. Keep all follow-up visits as told by your health care provider. This is important. Contact a health care provider if: You have pain that is not relieved with rest or medicine. Your pain gets worse, or you have new pain. You have a high  fever. You have rapid weight loss. You have trouble doing your normal activities. Get help right away if: You have weakness or numbness in one or both of your legs or feet. You have trouble controlling your bladder or your bowels. You have severe back pain and have any of the following: Nausea or vomiting. Pain in your abdomen. Shortness of breath or you faint. Summary Chronic back pain is back pain that lasts longer than 3 months. When a flare-up begins, apply ice to the painful area for the first 24-48 hours. Apply a moist heat pad or use a heating pad on the painful area as directed by your health care provider. When you are resting for longer periods, mix in some mild activity or stretching between periods of rest. This will help to prevent stiffness and pain. This information is not intended to replace advice given to you by your health care provider. Make sure you discuss any questions you have with your healthcare provider. Document Revised: 03/13/2019 Document Reviewed: 03/13/2019 Elsevier Patient Education  2022 [employer]vier Inc.  

## 2020-10-07 LAB — TSH: TSH: 1 u[IU]/mL (ref 0.450–4.500)

## 2020-10-07 LAB — CBC
Hematocrit: 41 % (ref 34.0–46.6)
Hemoglobin: 13 g/dL (ref 11.1–15.9)
MCH: 27.1 pg (ref 26.6–33.0)
MCHC: 31.7 g/dL (ref 31.5–35.7)
MCV: 86 fL (ref 79–97)
Platelets: 430 10*3/uL (ref 150–450)
RBC: 4.79 x10E6/uL (ref 3.77–5.28)
RDW: 14.8 % (ref 11.7–15.4)
WBC: 10.2 10*3/uL (ref 3.4–10.8)

## 2020-10-16 DIAGNOSIS — M5412 Radiculopathy, cervical region: Secondary | ICD-10-CM | POA: Insufficient documentation

## 2020-10-22 ENCOUNTER — Ambulatory Visit: Payer: 59 | Admitting: Nurse Practitioner

## 2020-10-22 ENCOUNTER — Ambulatory Visit: Payer: 59 | Admitting: Internal Medicine

## 2020-10-22 ENCOUNTER — Other Ambulatory Visit: Payer: Self-pay

## 2020-10-22 ENCOUNTER — Ambulatory Visit
Admission: RE | Admit: 2020-10-22 | Discharge: 2020-10-22 | Disposition: A | Discharge status: Home / Self Care | Payer: 59 | Source: Ambulatory Visit | Attending: Nurse Practitioner | Admitting: Nurse Practitioner

## 2020-10-22 ENCOUNTER — Encounter: Payer: Self-pay | Admitting: Nurse Practitioner

## 2020-10-22 ENCOUNTER — Encounter: Payer: Self-pay | Admitting: Internal Medicine

## 2020-10-22 VITALS — BP 130/70 | HR 86 | Temp 98.1°F | Ht 60.0 in | Wt 202.6 lb

## 2020-10-22 DIAGNOSIS — R911 Solitary pulmonary nodule: Secondary | ICD-10-CM

## 2020-10-22 DIAGNOSIS — M7541 Impingement syndrome of right shoulder: Secondary | ICD-10-CM | POA: Insufficient documentation

## 2020-10-22 DIAGNOSIS — Z23 Encounter for immunization: Secondary | ICD-10-CM | POA: Diagnosis not present

## 2020-10-22 NOTE — Progress Notes (Signed)
[facility]	[place] as a Neurosurgeon for [employer], NP.,have documented all relevant documentation on the behalf of [employer], NP,as directed by  Charlesetta Ivory, NP while in the presence of Charlesetta Ivory, NP.  This visit occurred during the SARS-CoV-2 public health emergency.  Safety protocols were in place, including screening questions prior to the visit, additional usage of staff PPE, and extensive cleaning of exam room while observing appropriate contact time as indicated for disinfecting solutions.  Subjective:     Patient ID: Olivia Paul , female    DOB: [birth year] , 65 y.o.   MRN: [id number]   Chief Complaint  Patient presents with   Lung Lesion    HPI  Patient presents today for a lung nodule.she stated she went to [facility] and had a shoulder xray done and they seen that she has a nodule on her lung.  She wants to get a lung chest xray done.      Past Medical History:  Diagnosis Date   Arthritis    lt knee   Asthma 2011   Complication of anesthesia    Headache    sinus   PONV (postoperative nausea and vomiting)    Pre-diabetes    Recurrent upper respiratory infection (URI)      Family History  Problem Relation Age of Onset   Allergic rhinitis Mother    Allergic rhinitis Father    Asthma Father    Urticaria Sister    Breast cancer Maternal Aunt    Breast cancer Paternal Aunt    Breast cancer Paternal Grandmother    Angioedema Neg Hx    Atopy Neg Hx    Eczema Neg Hx    Immunodeficiency Neg Hx      Current Outpatient Medications:    albuterol (PROVENTIL) (2.5 MG/3ML) 0.083% nebulizer solution, USE 1 VIAL EVERY 4-6 HOURS AS NEEDED FOR COUGH, WHEEZE, SHORTNESS OF BREATH, OR CHEST TIGHTNESS, Disp: 150 mL, Rfl: 0   albuterol (VENTOLIN HFA) 108 (90 Base) MCG/ACT inhaler, Inhale 2 puffs into the lungs every 4 (four) hours as needed for wheezing or shortness of breath., Disp: 18 g, Rfl: 1   Cholecalciferol (VITAMIN D) 50 MCG (2000 UT)  CAPS, Take 5,000 Units by mouth daily. , Disp: , Rfl:    clonazePAM (KLONOPIN) 0.5 MG tablet, TAKE 1 TABLET BY MOUTH TWICE A DAY AS NEEDED, Disp: 60 tablet, Rfl: 1   dexlansoprazole (DEXILANT) 60 MG capsule, Take 1 capsule (60 mg total) by mouth daily., Disp: 90 capsule, Rfl: 1   DULoxetine (CYMBALTA) 60 MG capsule, Take 1 capsule (60 mg total) by mouth daily., Disp: 90 capsule, Rfl: 1   EPINEPHrine 0.3 mg/0.3 mL IJ SOAJ injection, Inject 0.3 mLs (0.3 mg total) into the muscle as needed for anaphylaxis., Disp: 1 each, Rfl: 1   fexofenadine (ALLEGRA) 180 MG tablet, TAKE ONE TABLET ONCE DAILY FOR RUNNY NOSE OR ITCHING (Patient taking differently: Take 180 mg by mouth daily.), Disp: 90 tablet, Rfl: 3   fluticasone furoate-vilanterol (BREO ELLIPTA) 200-25 MCG/INH AEPB, Breo Ellipta 200 mcg-25 mcg/dose powder for inhalation  TAKE 1 PUFF BY MOUTH EVERY DAY, Disp: , Rfl:    gabapentin (NEURONTIN) 300 MG capsule, daily. , Disp: , Rfl:    Krill Oil (OMEGA-3) 500 MG CAPS, Take 500 mg by mouth daily., Disp: , Rfl:    magnesium oxide (MAG-OX) 400 MG tablet, Take 400 mg by mouth daily., Disp: , Rfl:    meclizine (ANTIVERT) 50 MG tablet, Take 1/2 tab po  qpm prn, Disp: 30 tablet, Rfl: 0   Menthol-Methyl Salicylate (TIGER BALM LINIMENT EX), Apply 1 application topically at bedtime as needed (pain)., Disp: , Rfl:    montelukast (SINGULAIR) 10 MG tablet, Take 1 tablet (10 mg total) by mouth at bedtime., Disp: 30 tablet, Rfl: 5   oxybutynin (DITROPAN-XL) 10 MG 24 hr tablet, Take 10 mg by mouth daily., Disp: , Rfl:    tiZANidine (ZANAFLEX) 4 MG tablet, Take 4 mg by mouth every 8 (eight) hours as needed., Disp: , Rfl:    traMADol (ULTRAM) 50 MG tablet, Take 50 mg by mouth every 8 (eight) hours as needed for moderate pain. , Disp: , Rfl:    TURMERIC PO, Take by mouth., Disp: , Rfl:    vitamin E 400 UNIT capsule, Take 400 Units by mouth daily., Disp: , Rfl:    Allergies  Allergen Reactions   Meloxicam Other (See  Comments)    Bruised her legs all over     Nsaids Other (See Comments)    Scarring and bruising of legs   Sulfa Antibiotics Other (See Comments)    Mouth broke out in sores   Oxycodone    Erythromycin Nausea And Vomiting     Review of Systems  Constitutional: Negative.  Negative for chills and fever.  HENT: Negative.  Negative for congestion.   Respiratory: Negative.  Negative for cough, shortness of breath and wheezing.   Cardiovascular:  Negative for chest pain and palpitations.    Today's Vitals   10/22/20 1142  BP: 130/70  Pulse: 86  Temp: 98.1 F (36.7 C)  Weight: 202 lb 9.6 oz (91.9 kg)  Height: 5' (1.524 m)  PainSc: 0-No pain   Body mass index is 39.57 kg/m.   Objective:  Physical Exam Constitutional:      Appearance: Normal appearance. She is obese.  HENT:     Head: Normocephalic and atraumatic.  Cardiovascular:     Rate and Rhythm: Normal rate and regular rhythm.     Pulses: Normal pulses.     Heart sounds: Normal heart sounds. No murmur heard. Pulmonary:     Effort: Pulmonary effort is normal. No respiratory distress.     Breath sounds: Normal breath sounds. No wheezing.  Skin:    General: Skin is warm and dry.     Capillary Refill: Capillary refill takes less than 2 seconds.  Neurological:     Mental Status: She is alert and oriented to person, place, and time.        Assessment And Plan:     1. Lung nodule -Went to [facility] this morning and they did a shoulder xray. Found a nodule that needs be further evaluated. Will send her for lung chest xray today. Denies chest pain, SOB, not a smoker but does have a strong history of cancer in the family.  - DG Chest 2 View; Future  2. Immunization due - Flu Vaccine QUAD 6+ mos PF IM (Fluarix Quad PF)   Follow up: if symptoms persist or do not get better.   The patient was encouraged to call or send a message through MyChart for any questions or concerns.   Side effects and appropriate use of all  the medication(s) were discussed with the patient today. Patient advised to use the medication(s) as directed by their healthcare provider. The patient was encouraged to read, review, and understand all associated package inserts and contact our office with any questions or concerns. The patient accepts the risks of  the treatment plan and had an opportunity to ask questions.   Patient was given opportunity to ask questions. Patient verbalized understanding of the plan and was able to repeat key elements of the plan. All questions were answered to their satisfaction.  Raman Glenetta Kiger, DNP   I, Raman Shakevia Sarris have reviewed all documentation for this visit. The documentation on 10/22/20 for the exam, diagnosis, procedures, and orders are all accurate and complete.    IF YOU HAVE BEEN REFERRED TO A SPECIALIST, IT MAY TAKE 1-2 WEEKS TO SCHEDULE/PROCESS THE REFERRAL. IF YOU HAVE NOT HEARD FROM US/SPECIALIST IN TWO WEEKS, PLEASE GIVE [place] A CALL AT [phone].   THE PATIENT IS ENCOURAGED TO PRACTICE SOCIAL DISTANCING DUE TO THE COVID-19 PANDEMIC.

## 2020-10-24 IMAGING — MG DIGITAL SCREENING BILATERAL MAMMOGRAM WITH TOMO AND CAD
8 series · 8 of 24 positions shown · non-contrast
Comparison: Previous exam(s).

CLINICAL DATA: Screening.

EXAM:
DIGITAL SCREENING BILATERAL MAMMOGRAM WITH TOMO AND CAD

[L CC synth-2D]
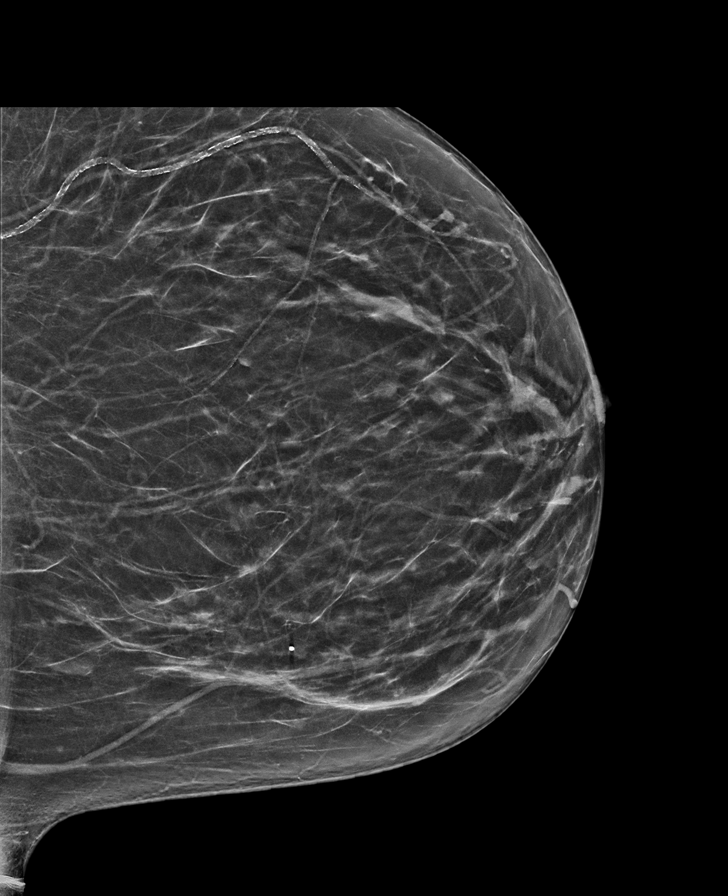

[L MLO synth-2D]
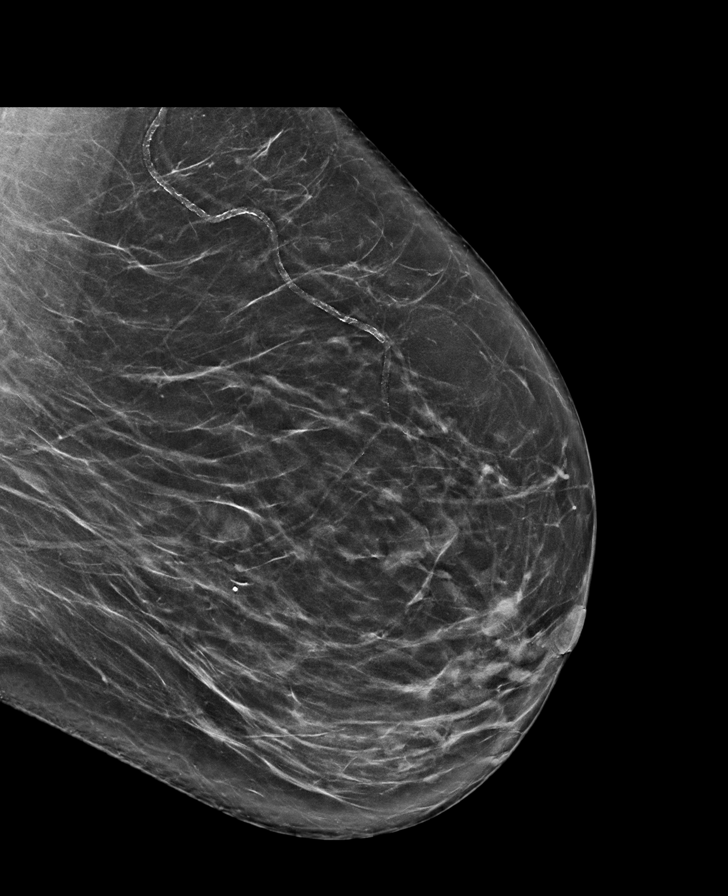

[R CC synth-2D]
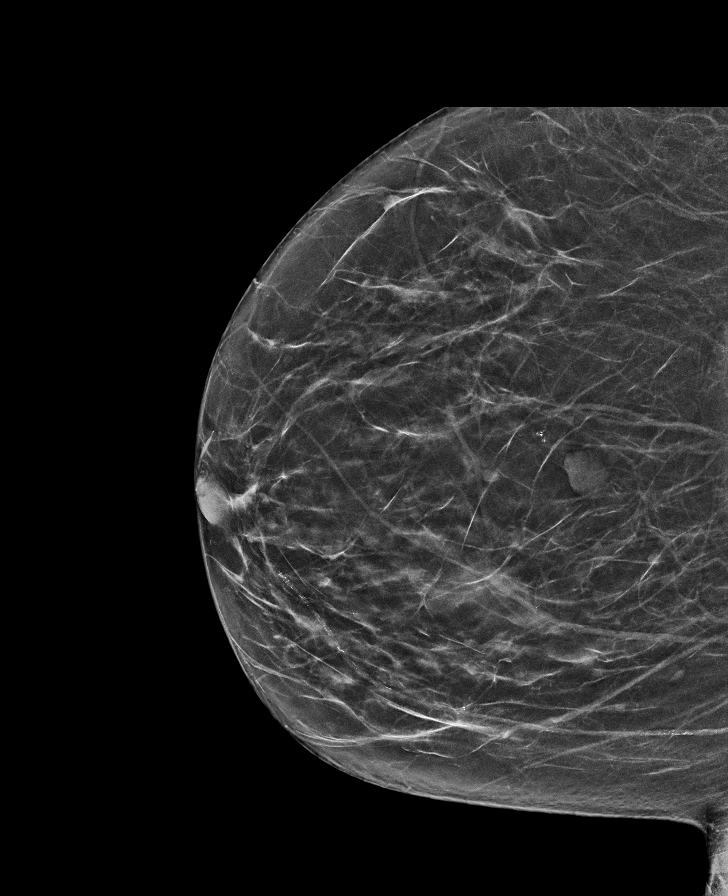

[R MLO synth-2D]
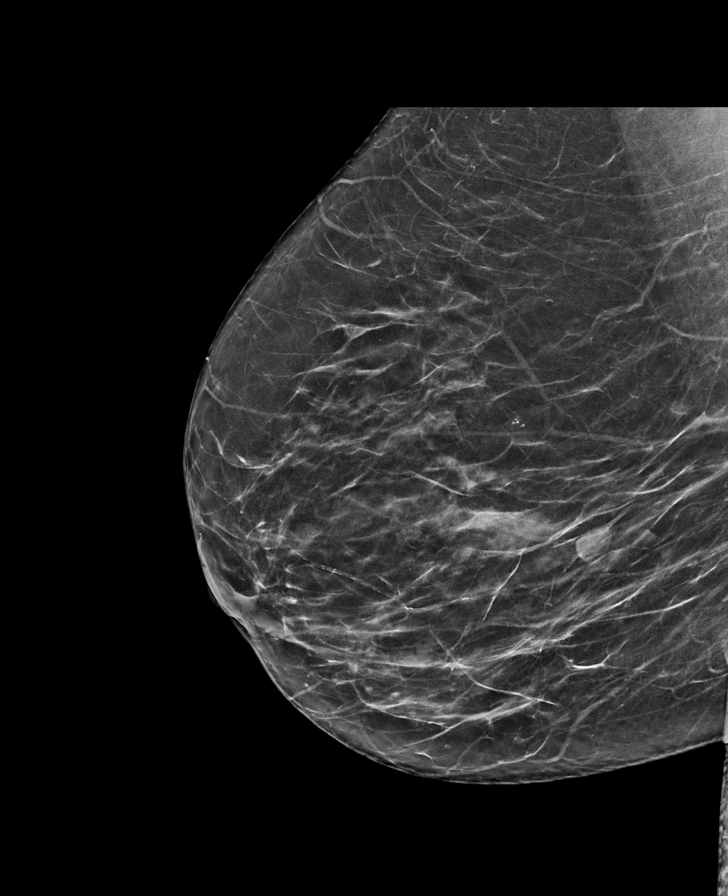

[L CC tomo · tomo slice 35/69.0]
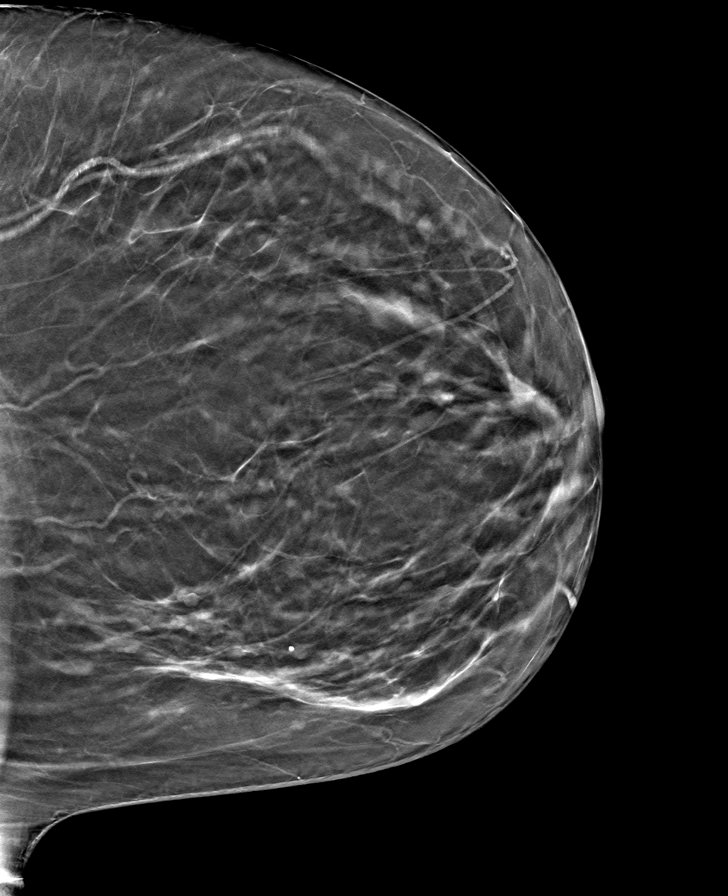

[R MLO tomo · tomo slice 37/72.0]
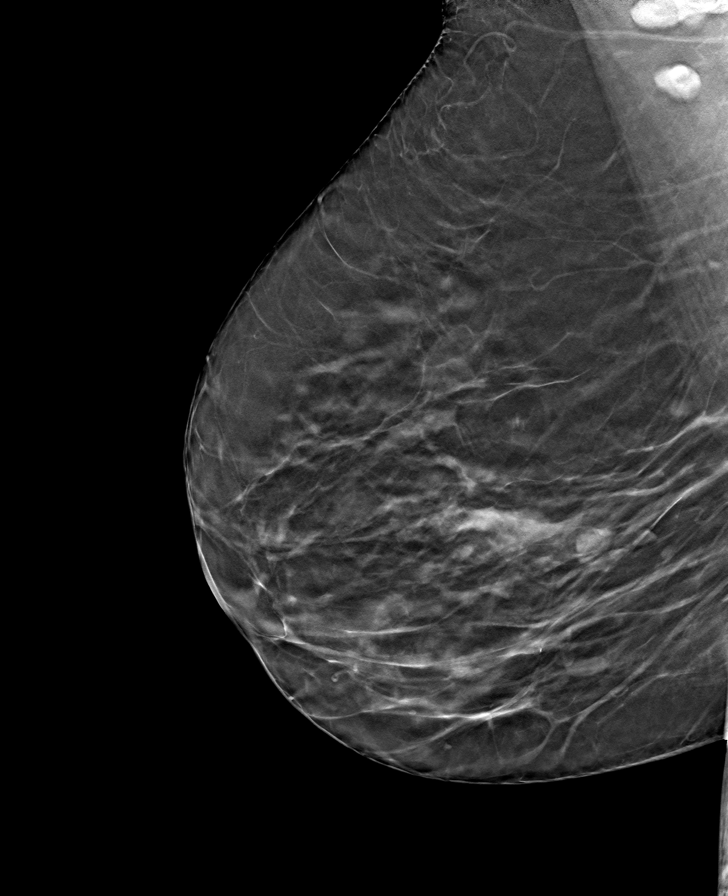

[L MLO tomo · tomo slice 41/82.0]
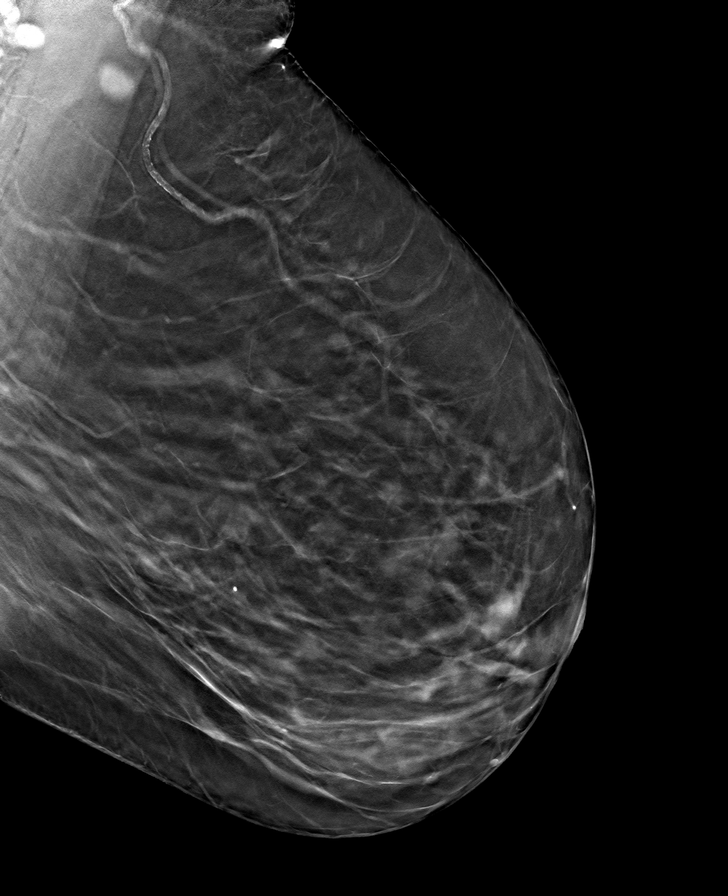

[R CC tomo · tomo slice 34/67.0]
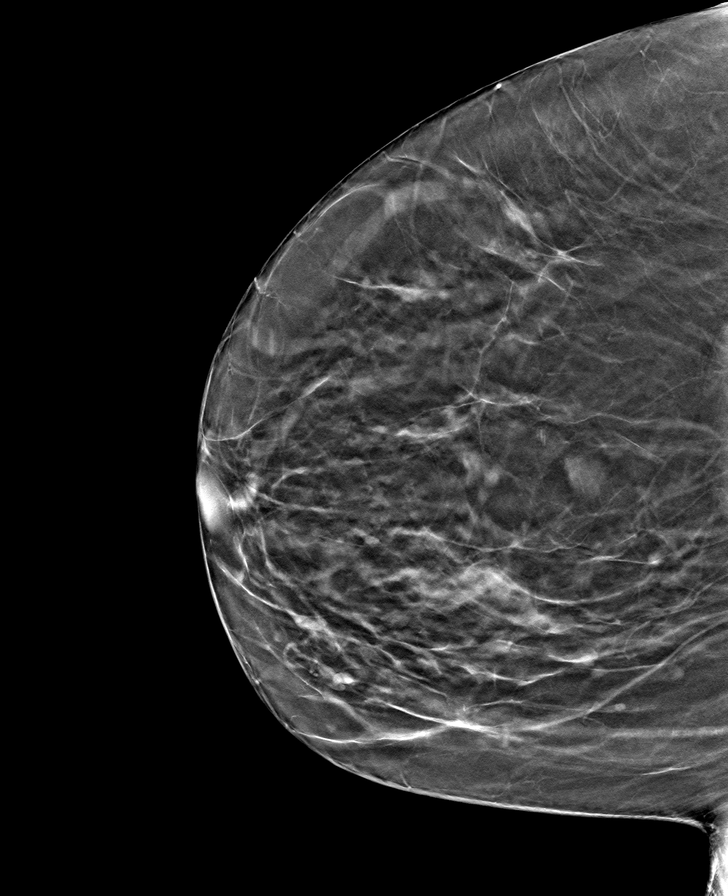

[8 of 24 positions shown; findings below may reference images not displayed]

ACR Breast Density Category b: There are scattered areas of
fibroglandular density.
FINDINGS: There are no findings suspicious for malignancy. Images were
processed with CAD.
IMPRESSION: No mammographic evidence of malignancy. A result letter of this
screening mammogram will be mailed directly to the patient.

RECOMMENDATION:
Screening mammogram in one year. (Code:[id number]-775)

BI-RADS CATEGORY  1: Negative.

## 2020-11-11 DIAGNOSIS — Z0289 Encounter for other administrative examinations: Secondary | ICD-10-CM

## 2020-11-13 ENCOUNTER — Other Ambulatory Visit: Payer: Self-pay | Admitting: Internal Medicine

## 2020-11-13 ENCOUNTER — Ambulatory Visit (INDEPENDENT_AMBULATORY_CARE_PROVIDER_SITE_OTHER): Payer: 59 | Admitting: Allergy

## 2020-11-13 ENCOUNTER — Encounter: Payer: Self-pay | Admitting: Allergy

## 2020-11-13 ENCOUNTER — Other Ambulatory Visit: Payer: Self-pay

## 2020-11-13 VITALS — BP 120/74 | HR 107 | Temp 97.9°F | Resp 16 | Ht 60.0 in | Wt 201.9 lb

## 2020-11-13 DIAGNOSIS — H1010 Acute atopic conjunctivitis, unspecified eye: Secondary | ICD-10-CM

## 2020-11-13 DIAGNOSIS — J3089 Other allergic rhinitis: Secondary | ICD-10-CM | POA: Diagnosis not present

## 2020-11-13 DIAGNOSIS — J4540 Moderate persistent asthma, uncomplicated: Secondary | ICD-10-CM

## 2020-11-13 DIAGNOSIS — J309 Allergic rhinitis, unspecified: Secondary | ICD-10-CM | POA: Diagnosis not present

## 2020-11-13 DIAGNOSIS — H1013 Acute atopic conjunctivitis, bilateral: Secondary | ICD-10-CM

## 2020-11-13 MED ORDER — FEXOFENADINE HCL 180 MG PO TABS: ORAL_TABLET | ORAL | 1 refills | Status: DC

## 2020-11-13 MED ORDER — IPRATROPIUM BROMIDE 0.06 % NA SOLN: 2.0000 | Freq: Four times a day (QID) | NASAL | 5 refills | Status: DC | PRN

## 2020-11-13 MED ORDER — MONTELUKAST SODIUM 10 MG PO TABS: 10.0000 mg | ORAL_TABLET | Freq: Every day | ORAL | 5 refills | Status: DC

## 2020-11-13 NOTE — Progress Notes (Signed)
Follow-up Note  RE: Olivia Paul MRN: [id number] DOB: Apr 08, 1955 Date of Office Visit: 11/13/2020   History of present illness: Olivia Paul is a 65 y.o. female presenting today for follow-up of asthma, allergic rhinitis.  She was last seen in the office on 05/14/20 by myself.   She had revision in August 31, 2020 of her left knee.  She is in PT still.  She states the knee is "way better" than prior to this revision.  She states a few times (about 3 times) since March she has had some asthma symptoms where she had heavy breathing, wheezing and coughing and tightness.  She did use albuterol with the symptoms with relief. She is taking Breo 1 puff mostly every other day.  She states this time of the year with the changes in the weather is a typical time for her asthma to flare.  She states she now she should be taking her Breo daily at this point.  She does take singulair daily.  She has not had any ED or urgent care visits in regards to her asthma or any systemic steroid needs since the last visit. She does report "a lot" of frontal headaches at this time and states it can be pretty much daily. She takes Careers adviser as needed.  Not using any nasal sprays at this time.  Review of systems: Review of Systems  Constitutional: Negative.   HENT: Negative.    Eyes: Negative.   Respiratory:         See HPI  Cardiovascular: Negative.   Gastrointestinal: Negative.   Musculoskeletal: Negative.   Skin: Negative.   Neurological:  Positive for headaches.   All other systems negative unless noted above in HPI  Past medical/social/surgical/family history have been reviewed and are unchanged unless specifically indicated below.  No changes  Medication List: Current Outpatient Medications  Medication Sig Dispense Refill   albuterol (PROVENTIL) (2.5 MG/3ML) 0.083% nebulizer solution USE 1 VIAL EVERY 4-6 HOURS AS NEEDED FOR COUGH, WHEEZE, SHORTNESS OF BREATH, OR CHEST TIGHTNESS 150 mL 0    albuterol (VENTOLIN HFA) 108 (90 Base) MCG/ACT inhaler Inhale 2 puffs into the lungs every 4 (four) hours as needed for wheezing or shortness of breath. 18 g 1   Cholecalciferol (VITAMIN D) 50 MCG (2000 UT) CAPS Take 5,000 Units by mouth daily.      clonazePAM (KLONOPIN) 0.5 MG tablet TAKE 1 TABLET BY MOUTH TWICE A DAY AS NEEDED 60 tablet 1   dexlansoprazole (DEXILANT) 60 MG capsule Take 1 capsule (60 mg total) by mouth daily. 90 capsule 1   DULoxetine (CYMBALTA) 60 MG capsule Take 1 capsule (60 mg total) by mouth daily. 90 capsule 1   EPINEPHrine 0.3 mg/0.3 mL IJ SOAJ injection Inject 0.3 mLs (0.3 mg total) into the muscle as needed for anaphylaxis. 1 each 1   fexofenadine (ALLEGRA) 180 MG tablet TAKE ONE TABLET ONCE DAILY FOR RUNNY NOSE OR ITCHING (Patient taking differently: Take 180 mg by mouth daily.) 90 tablet 3   fluticasone furoate-vilanterol (BREO ELLIPTA) 200-25 MCG/INH AEPB Breo Ellipta 200 mcg-25 mcg/dose powder for inhalation  TAKE 1 PUFF BY MOUTH EVERY DAY     gabapentin (NEURONTIN) 300 MG capsule daily.      Krill Oil (OMEGA-3) 500 MG CAPS Take 500 mg by mouth daily.     magnesium oxide (MAG-OX) 400 MG tablet Take 400 mg by mouth daily.     meclizine (ANTIVERT) 50 MG tablet Take 1/2 tab po qpm  prn 30 tablet 0   Menthol-Methyl Salicylate (TIGER BALM LINIMENT EX) Apply 1 application topically at bedtime as needed (pain).     montelukast (SINGULAIR) 10 MG tablet Take 1 tablet (10 mg total) by mouth at bedtime. 30 tablet 5   oxybutynin (DITROPAN-XL) 10 MG 24 hr tablet Take 10 mg by mouth daily.     tiZANidine (ZANAFLEX) 4 MG tablet Take 4 mg by mouth every 8 (eight) hours as needed.     traMADol (ULTRAM) 50 MG tablet Take 50 mg by mouth every 8 (eight) hours as needed for moderate pain.      TURMERIC PO Take by mouth.     vitamin E 400 UNIT capsule Take 400 Units by mouth daily.     No current facility-administered medications for this visit.     Known medication  allergies: Allergies  Allergen Reactions   Meloxicam Other (See Comments)    Bruised her legs all over     Nsaids Other (See Comments)    Scarring and bruising of legs   Sulfa Antibiotics Other (See Comments)    Mouth broke out in sores   Oxycodone    Erythromycin Nausea And Vomiting     Physical examination: Blood pressure 120/74, pulse (!) 107, temperature 97.9 F (36.6 C), temperature source Temporal, resp. rate 16, height 5' (1.524 m), weight 201 lb 14.4 oz (91.6 kg), SpO2 95 %.  General: Alert, interactive, in no acute distress. HEENT: PERRLA, TMs pearly gray, turbinates non-edematous without discharge, post-pharynx non erythematous. Neck: Supple without lymphadenopathy. Lungs: Clear to auscultation without wheezing, rhonchi or rales. {no increased work of breathing. CV: Normal S1, S2 without murmurs. Abdomen: Nondistended, nontender. Skin: Warm and dry, without lesions or rashes. Extremities:  No clubbing, cyanosis or edema. Neuro:   Grossly intact.  Diagnositics/Labs:  Spirometry: FEV1: 1.62L 99%, FVC: 1.75L 83%, ratio consistent with nonobstructive pattern  Assessment and plan:   Asthma, moderate persistent - take Breo 1 puff once a day.  We discussed to be more consistent with daily use at this time with the weather changes - albuterol as needed 2 puffs every 4-6 hours for cough, wheeze, chest tightness, difficulty breathing - continue singulair 10mg  at bedtime daily - your lung function testing remains normal Asthma control goals:  Full participation in all desired activities (may need albuterol before activity) Albuterol use two time or less a week on average (not counting use with activity) Cough interfering with sleep two time or less a month Oral steroids no more than once a year No hospitalizations  Allergic rhinitis with conjunctivitis - continue singulair as above - use Allegra 180mg  daily at this time - start nasal Atrovent 1-2 sprays each  nostril up to 3-4 times a day as needed for nasal drainage or congestion - I am hopeful with consistent use of Singulair, Allegra and Atrovent that she will notice improvement in the headache she is having  Follow-up in  3-4 months or sooner if needed  I appreciate the opportunity to take part in Xianna's care. Please do not hesitate to contact me with questions.  Sincerely,   , MD Allergy/Immunology [facility]

## 2020-11-13 NOTE — Patient Instructions (Addendum)
Asthma - take Breo 1 puff once a day.  We discussed to be more consistent with daily use at this time with the weather changes - albuterol as needed 2 puffs every 4-6 hours for cough, wheeze, chest tightness, difficulty breathing - continue singulair 10mg  at bedtime daily - your lung function testing remains normal Asthma control goals:  Full participation in all desired activities (may need albuterol before activity) Albuterol use two time or less a week on average (not counting use with activity) Cough interfering with sleep two time or less a month Oral steroids no more than once a year No hospitalizations  Allergies - continue singulair as above - use Allegra 180mg  daily at this time - start nasal Atrovent 1-2 sprays each nostril up to 3-4 times a day as needed for nasal drainage or congestion - I am hopeful with consistent use of Singulair, Allegra and Atrovent that she will notice improvement in the headache she is having   Follow-up in  3-4 months or sooner if needed

## 2020-11-15 ENCOUNTER — Other Ambulatory Visit: Payer: Self-pay | Admitting: Internal Medicine

## 2020-11-18 ENCOUNTER — Ambulatory Visit: Payer: 59 | Admitting: Allergy

## 2020-11-26 DIAGNOSIS — M65331 Trigger finger, right middle finger: Secondary | ICD-10-CM | POA: Insufficient documentation

## 2020-12-10 ENCOUNTER — Encounter (INDEPENDENT_AMBULATORY_CARE_PROVIDER_SITE_OTHER): Payer: Self-pay | Admitting: Family Medicine

## 2020-12-10 ENCOUNTER — Other Ambulatory Visit: Payer: Self-pay

## 2020-12-10 ENCOUNTER — Ambulatory Visit (INDEPENDENT_AMBULATORY_CARE_PROVIDER_SITE_OTHER): Payer: 59 | Admitting: Family Medicine

## 2020-12-10 VITALS — BP 120/81 | HR 76 | Temp 97.9°F | Ht 61.0 in | Wt 198.0 lb

## 2020-12-10 DIAGNOSIS — Z1331 Encounter for screening for depression: Secondary | ICD-10-CM | POA: Diagnosis not present

## 2020-12-10 DIAGNOSIS — J45909 Unspecified asthma, uncomplicated: Secondary | ICD-10-CM | POA: Diagnosis not present

## 2020-12-10 DIAGNOSIS — E559 Vitamin D deficiency, unspecified: Secondary | ICD-10-CM | POA: Diagnosis not present

## 2020-12-10 DIAGNOSIS — Z9189 Other specified personal risk factors, not elsewhere classified: Secondary | ICD-10-CM | POA: Diagnosis not present

## 2020-12-10 DIAGNOSIS — F39 Unspecified mood [affective] disorder: Secondary | ICD-10-CM

## 2020-12-10 DIAGNOSIS — R5383 Other fatigue: Secondary | ICD-10-CM

## 2020-12-10 DIAGNOSIS — R7303 Prediabetes: Secondary | ICD-10-CM

## 2020-12-10 DIAGNOSIS — Z6837 Body mass index (BMI) 37.0-37.9, adult: Secondary | ICD-10-CM

## 2020-12-10 DIAGNOSIS — R0602 Shortness of breath: Secondary | ICD-10-CM | POA: Diagnosis not present

## 2020-12-10 DIAGNOSIS — G5792 Unspecified mononeuropathy of left lower limb: Secondary | ICD-10-CM

## 2020-12-11 LAB — COMPREHENSIVE METABOLIC PANEL
ALT: 13 IU/L (ref 0–32)
AST: 14 IU/L (ref 0–40)
Albumin/Globulin Ratio: 1.6 (ref 1.2–2.2)
Albumin: 4.4 g/dL (ref 3.8–4.8)
Alkaline Phosphatase: 129 IU/L — ABNORMAL HIGH (ref 44–121)
BUN/Creatinine Ratio: 12 (ref 12–28)
BUN: 8 mg/dL (ref 8–27)
Bilirubin Total: 0.5 mg/dL (ref 0.0–1.2)
CO2: 26 mmol/L (ref 20–29)
Calcium: 9.7 mg/dL (ref 8.7–10.3)
Chloride: 99 mmol/L (ref 96–106)
Creatinine, Ser: 0.65 mg/dL (ref 0.57–1.00)
Globulin, Total: 2.7 g/dL (ref 1.5–4.5)
Glucose: 88 mg/dL (ref 70–99)
Potassium: 4.6 mmol/L (ref 3.5–5.2)
Sodium: 142 mmol/L (ref 134–144)
Total Protein: 7.1 g/dL (ref 6.0–8.5)
eGFR: 98 mL/min/{1.73_m2} (ref >59)

## 2020-12-11 LAB — LIPID PANEL WITH LDL/HDL RATIO
Cholesterol, Total: 196 mg/dL (ref 100–199)
HDL: 56 mg/dL (ref >39)
LDL Chol Calc (NIH): 128 mg/dL — ABNORMAL HIGH (ref 0–99)
LDL/HDL Ratio: 2.3 ratio (ref 0.0–3.2)
Triglycerides: 65 mg/dL (ref 0–149)
VLDL Cholesterol Cal: 12 mg/dL (ref 5–40)

## 2020-12-11 LAB — HEMOGLOBIN A1C
Est. average glucose Bld gHb Est-mCnc: 128 mg/dL
Hgb A1c MFr Bld: 6.1 % — ABNORMAL HIGH (ref 4.8–5.6)

## 2020-12-11 LAB — T3: T3, Total: 128 ng/dL (ref 71–180)

## 2020-12-11 LAB — VITAMIN B12: Vitamin B-12: 460 pg/mL (ref 232–1245)

## 2020-12-11 LAB — INSULIN, RANDOM: INSULIN: 12.1 u[IU]/mL (ref 2.6–24.9)

## 2020-12-11 LAB — T4, FREE: Free T4: 1.14 ng/dL (ref 0.82–1.77)

## 2020-12-11 LAB — VITAMIN D 25 HYDROXY (VIT D DEFICIENCY, FRACTURES): Vit D, 25-Hydroxy: 47.4 ng/mL (ref 30.0–100.0)

## 2020-12-11 LAB — FOLATE: Folate: 3.7 ng/mL (ref >3.0)

## 2020-12-14 NOTE — Progress Notes (Signed)
Chief Complaint:   OBESITY Olivia Paul (MR# [id number]) is a 65 y.o. female who presents for evaluation and treatment of obesity and related comorbidities. Current BMI is Body mass index is 37.41 kg/m. Olivia Paul has been struggling with her weight for many years and has been unsuccessful in either losing weight, maintaining weight loss, or reaching her healthy weight goal.  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 She is single and her cousin Olivia Paul lives with her. She craves candy, chips, and popcorn. She has used Weight Watcher's in the past.   Olivia Paul is currently in the action stage of change and ready to dedicate time achieving and maintaining a healthier weight. Olivia Paul is interested in becoming our patient and working on intensive lifestyle modifications including (but not limited to) diet and exercise for weight loss.  Olivia Paul's habits were reviewed today and are as follows: Her family eats meals together, her desired weight loss is 63 lbs, she started gaining weight while taking medications for pain, her heaviest weight ever was 220 pounds, she is a picky eater and doesn't like to eat healthier foods, she has significant food cravings issues, she snacks frequently in the evenings, she skips meals frequently, she is frequently drinking liquids with calories, she frequently makes poor food choices, she frequently eats larger portions than normal, and she struggles with emotional eating.  Depression Screen Olivia Paul's Food and Mood (modified PHQ-9) score was 12.  Depression screen PHQ 2/9 12/10/2020  Decreased Interest 1  Down, Depressed, Hopeless 0  PHQ - 2 Score 1  Altered sleeping 2  Tired, decreased energy 2  Change in appetite 3  Feeling bad or failure about yourself  0  Trouble concentrating 2  Moving slowly or fidgety/restless 2  Suicidal thoughts 0  PHQ-9 Score 12  Difficult doing work/chores Not difficult at all   Subjective:    1. Other fatigue Olivia Paul admits to daytime somnolence and admits to waking up still tired. Patent has a history of symptoms of daytime fatigue and morning headache. Olivia Paul generally gets 8 hours of sleep per night, and states that she has nightime awakenings. Snoring is not present. Apneic episodes are not present. Epworth Sleepiness Score is 2.  2. Shortness of breath on exertion Olivia Paul notes increasing shortness of breath with exercising and seems to be worsening over time with weight gain. She notes getting out of breath sooner with activity than she used to. This has not gotten worse recently. Olivia Paul denies shortness of breath at rest or orthopnea.  3. Reactive airway disease without complication, unspecified asthma severity, unspecified whether persistent Olivia Paul is taking Singulair, Breo, and albuterol as needed.  4. Vitamin D deficiency Olivia Paul has a history of osteopenia as well. She is taking OTC Vit D 2,000 IU q daily.   5. Pre-diabetes Olivia Paul has a diagnosis of pre-diabetes based on her elevated HgA1c and was informed this puts her at greater risk of developing diabetes. She is not on medications. She continues to work on diet and exercise to decrease her risk of diabetes. She denies nausea or hypoglycemia.  6. Neuropathy of left lower extremity Olivia Paul is taking gabapentin. She had symptoms in July 2022. She has had neuropathy ever since, and she was started on gabapentin for this. She only takes 1-2 per day.  7. Mood disorder (HCC) with emotional eating Olivia Paul is taking Clonazepam as needed, and Cymbalta. She has a history of generalized anxiety disorder "drinking anxiety".  8. At  risk for diabetes mellitus Olivia Paul is at higher than average risk for developing diabetes due to a history of pre-diabetes.  Assessment/Plan:   Orders Placed This Encounter  Procedures   Vitamin B12   Comprehensive metabolic panel   Folate   Hemoglobin A1c   Insulin, random    Lipid Panel With LDL/HDL Ratio   T3   T4, free   VITAMIN D 25 Hydroxy (Vit-D Deficiency, Fractures)    There are no discontinued medications.   No orders of the defined types were placed in this encounter.    1. Other fatigue Olivia Paul does feel that her weight is causing her energy to be lower than it should be. Fatigue may be related to obesity, depression or many other causes. Labs will be ordered, and in the meanwhile, Olivia Paul will focus on self care including making healthy food choices, increasing physical activity and focusing on stress reduction.  - Vitamin B12 - Folate - T3 - T4, free - VITAMIN D 25 Hydroxy (Vit-D Deficiency, Fractures)  2. Shortness of breath on exertion Olivia Paul does feel that she gets out of breath more easily that she used to when she exercises. Olivia Paul's shortness of breath appears to be obesity related and exercise induced. She has agreed to work on weight loss and gradually increase exercise to treat her exercise induced shortness of breath. Will continue to monitor closely.  - Lipid Panel With LDL/HDL Ratio  3. Reactive airway disease without complication, unspecified asthma severity, unspecified whether persistent Olivia Paul's issues are stable and she denies issues. We will check labs today. Olivia Paul will continue her medication management per her primary care physician.  4. Vitamin D deficiency Low Vitamin D level contributes to fatigue and are associated with obesity, breast, and colon cancer. We will check labs today. Olivia Paul will follow-up for routine testing of Vitamin D, at least 2-3 times per year to avoid over-replacement.  5. Pre-diabetes Olivia Paul will continue to work on weight loss, exercise, and decreasing simple carbohydrates to help decrease the risk of diabetes. We will check labs today.  - Comprehensive metabolic panel - Hemoglobin A1c - Insulin, random  6. Neuropathy of left lower extremity We check labs today. Olivia Paul will  continue to follow up as directed.  7. Mood disorder (HCC) with emotional eating Olivia Paul declines Dr. Dewaine Conger referral today, but will consider in the future. She will decrease Clonazepam and continue Cymbalta as is. Orders and follow up as documented in patient record.   8. Screening for depression Olivia Paul had a positive depression screening. Depression is commonly associated with obesity and often results in emotional eating behaviors. We will monitor this closely and work on CBT to help improve the non-hunger eating patterns. Referral to Psychology may be required if no improvement is seen as she continues in our clinic.  9. At risk for diabetes mellitus Olivia Paul was given approximately 15 minutes of diabetes education and counseling today. We discussed intensive lifestyle modifications today with an emphasis on weight loss as well as increasing exercise and decreasing simple carbohydrates in her diet. We also reviewed medication options with an emphasis on risk versus benefit of those discussed.   Repetitive spaced learning was employed today to elicit superior memory formation and behavioral change.  10. Obesity with current BMI of 37.6 Olivia Paul is currently in the action stage of change and her goal is to continue with weight loss efforts. I recommend Ruba begin the structured treatment plan as follows:  She has agreed to the Category 1 Plan.  Exercise goals: As is.   Behavioral modification strategies: no skipping meals and meal planning and cooking strategies.  She was informed of the importance of frequent follow-up visits to maximize her success with intensive lifestyle modifications for her multiple health conditions. She was informed we would discuss her lab results at her next visit unless there is a critical issue that needs to be addressed sooner. Orilla agreed to keep her next visit at the agreed upon time to discuss these results.  Objective:   Blood pressure 120/81,  pulse 76, temperature 97.9 F (36.6 C), height 5\' 1"  (1.549 m), weight 198 lb (89.8 kg), SpO2 99 %. Body mass index is 37.41 kg/m.  EKG: Normal sinus rhythm, rate 60 BPM.  Indirect Calorimeter completed today shows a VO2 of 198 and a REE of 1368.  Her calculated basal metabolic rate is thus her basal metabolic rate is worse than expected.  General: Cooperative, alert, well developed, in no acute distress. HEENT: Conjunctivae and lids unremarkable. Cardiovascular: Regular rhythm.  Lungs: Normal work of breathing. Neurologic: No focal deficits.   Lab Results  Component Value Date   CREATININE 0.65 12/10/2020   BUN 8 12/10/2020   NA 142 12/10/2020   K 4.6 12/10/2020   CL 99 12/10/2020   CO2 26 12/10/2020   Lab Results  Component Value Date   ALT 13 12/10/2020   AST 14 12/10/2020   ALKPHOS 129 (H) 12/10/2020   BILITOT 0.5 12/10/2020   Lab Results  Component Value Date   HGBA1C 6.1 (H) 12/10/2020   HGBA1C 6.0 (H) 04/09/2020   HGBA1C 5.9 (H) 04/03/2019   HGBA1C 5.5 11/08/2018   HGBA1C 5.8 (H) 03/26/2018   Lab Results  Component Value Date   INSULIN 12.1 12/10/2020   Lab Results  Component Value Date   TSH 1.000 10/06/2020   Lab Results  Component Value Date   CHOL 196 12/10/2020   HDL 56 12/10/2020   LDLCALC 128 (H) 12/10/2020   TRIG 65 12/10/2020   CHOLHDL 3.4 04/09/2020   Lab Results  Component Value Date   WBC 10.2 10/06/2020   HGB 13.0 10/06/2020   HCT 41.0 10/06/2020   MCV 86 10/06/2020   PLT 430 10/06/2020   No results found for: IRON, TIBC, FERRITIN  Attestation Statements:   Reviewed by clinician on day of visit: allergies, medications, problem list, medical history, surgical history, family history, social history, and previous encounter notes.   10/08/2020, am acting as transcriptionist for Trude Mcburney, DO.  I have reviewed the above documentation for accuracy and completeness, and I agree with the above. [employer],  D.O.  The 21st Century Cures Act was signed into law in 2016 which includes the topic of electronic health records.  This provides immediate access to information in MyChart.  This includes consultation notes, operative notes, office notes, lab results and pathology reports.  If you have any questions about what you read please let 2017 know at your next visit so we can discuss your concerns and take corrective action if need be.  We are right here with you.

## 2020-12-20 ENCOUNTER — Other Ambulatory Visit: Payer: Self-pay | Admitting: Internal Medicine

## 2020-12-20 DIAGNOSIS — F419 Anxiety disorder, unspecified: Secondary | ICD-10-CM

## 2020-12-24 ENCOUNTER — Encounter (INDEPENDENT_AMBULATORY_CARE_PROVIDER_SITE_OTHER): Payer: Self-pay | Admitting: Family Medicine

## 2020-12-24 ENCOUNTER — Ambulatory Visit (INDEPENDENT_AMBULATORY_CARE_PROVIDER_SITE_OTHER): Payer: 59 | Admitting: Family Medicine

## 2020-12-24 ENCOUNTER — Other Ambulatory Visit: Payer: Self-pay

## 2020-12-24 VITALS — BP 119/79 | HR 68 | Temp 98.1°F | Ht 61.0 in | Wt 196.0 lb

## 2020-12-24 DIAGNOSIS — R7303 Prediabetes: Secondary | ICD-10-CM

## 2020-12-24 DIAGNOSIS — Z6837 Body mass index (BMI) 37.0-37.9, adult: Secondary | ICD-10-CM

## 2020-12-24 DIAGNOSIS — E7849 Other hyperlipidemia: Secondary | ICD-10-CM

## 2020-12-24 DIAGNOSIS — Z9189 Other specified personal risk factors, not elsewhere classified: Secondary | ICD-10-CM

## 2020-12-24 DIAGNOSIS — F39 Unspecified mood [affective] disorder: Secondary | ICD-10-CM | POA: Diagnosis not present

## 2020-12-24 DIAGNOSIS — E559 Vitamin D deficiency, unspecified: Secondary | ICD-10-CM

## 2020-12-24 NOTE — Patient Instructions (Signed)
The 10-year ASCVD risk score (Arnett DK, et al., 2019) is: 6%   Values used to calculate the score:     Age: 65 years     Sex: Female     Is Non-Hispanic African American: Yes     Diabetic: No     Tobacco smoker: No     Systolic Blood Pressure: 119 mmHg     Is BP treated: No     HDL Cholesterol: 56 mg/dL     Total Cholesterol: 196 mg/dL

## 2020-12-25 HISTORY — PX: TRIGGER FINGER RELEASE: SHX641

## 2020-12-27 ENCOUNTER — Encounter: Payer: Self-pay | Admitting: Cardiology

## 2020-12-28 NOTE — Progress Notes (Signed)
Chief Complaint:   OBESITY Olivia Paul is here to discuss her progress with her obesity treatment plan along with follow-up of her obesity related diagnoses. Olivia Paul is on the Category 1 Plan and states she is following her eating plan approximately 80% of the time. Olivia Paul states she is doing 0 minutes 0 times per week.  Today's visit was #: 2 Starting weight: 198 lbs Starting date: 12/10/2020 Today's weight: 196 lbs Today's date: 12/24/2020 Total lbs lost to date: 2 Total lbs lost since last in-office visit: 2  Interim History: Olivia Paul is here today for her first follow-up office visit since starting the program with [place]. All blood work/ lab tests that were recently ordered by myself or an outside provider were reviewed with patient today per their request. Extended time was spent counseling her on all new disease processes that were discovered or preexisting ones that are affected by BMI. She understands that many of these abnormalities will need to monitored regularly along with the current treatment plan of prudent dietary changes, in which we are making each and every office visit, to improve these health parameters. Olivia Paul did not properly use her snack calories. She notes she went over. She did eat less unhealthy at work this week, especially when sitting. She has a lot of questions today about what she can and cannot eat.  Subjective:   1. Pre-diabetes Olivia Paul has a diagnosis of pre-diabetes based on her elevated HgA1c and was informed this puts her at greater risk of developing diabetes. She is not on medications, and she denies cravings (physical) or hunger. She continues to work on diet and exercise to decrease her risk of diabetes. She denies nausea or hypoglycemia. I discussed labs with the patient today.  2. Other hyperlipidemia Olivia Paul has new diagnosis of hyperlipidemia. Her LDL is elevated and her ASCVD is elevated at 6%. She has been trying to improve her  cholesterol levels with intensive lifestyle modification including a low saturated fat diet, exercise and weight loss. She denies any chest pain, claudication or myalgias. I discussed labs with the patient today.  3. Vitamin D deficiency Olivia Paul is currently taking OTC vitamin D 5,000 IU each day. She has a history of osteopenia. Her Vitamin D level is now at goal. She denies nausea, vomiting or muscle weakness. I discussed labs with the patient today.  4. Mood disorder (HCC) with emotional eating Olivia Paul takes Cymbalta, and her mood is stable. She feels good about her accomplishments.   5. At risk for diabetes mellitus Olivia Paul is at higher than average risk for developing diabetes due to obesity, pre-diabetes, and hyperlipidemia.   Assessment/Plan:  No orders of the defined types were placed in this encounter.   There are no discontinued medications.   No orders of the defined types were placed in this encounter.    1. Pre-diabetes Olivia Paul will consider medications in the future as needed. She will continue to work on weight loss, exercise, and decreasing simple carbohydrates to help decrease the risk of diabetes.   2. Other hyperlipidemia Cardiovascular risk and specific lipid/LDL goals reviewed. We discussed several lifestyle modifications today. Olivia Paul declines medications (statin), and she will continue to work on her prudent nutritional plan and weight loss. We will recheck labs in 4-6 months. Education was provided today. Orders and follow up as documented in patient record.   Counseling Intensive lifestyle modifications are the first line treatment for this issue. Dietary changes: Increase soluble fiber. Decrease simple carbohydrates. Exercise  changes: Moderate to vigorous-intensity aerobic activity 150 minutes per week if tolerated. Lipid-lowering medications: see documented in medical record.  3. Vitamin D deficiency Low Vitamin D level contributes to fatigue and are  associated with obesity, breast, and colon cancer. Olivia Paul will continue her OTC Vitamin D supplement. Education was provided on Vit D and osteopenia. She will follow-up for routine testing of Vitamin D, at least 2-3 times per year to avoid over-replacement.  4. Mood disorder (HCC) with emotional eating Behavior modification techniques were discussed today to help Olivia Paul deal with her emotional/non-hunger eating behaviors.  Orders and follow up as documented in patient record.   5. At risk for diabetes mellitus Olivia Paul was given approximately 25 minutes of diabetes education and counseling today. We discussed intensive lifestyle modifications today with an emphasis on weight loss as well as increasing exercise and decreasing simple carbohydrates in her diet. We also reviewed medication options with an emphasis on risk versus benefit of those discussed.   Repetitive spaced learning was employed today to elicit superior memory formation and behavioral change.  6. Obesity with current BMI of 37.0 Olivia Paul is currently in the action stage of change. As such, her goal is to continue with weight loss efforts. She has agreed to the Category 1 Plan.   Olivia Paul is to only eat 100 calorie snacks, and increased her bottled water to more than 2 bottles per day as she is currently at.  Exercise goals: As is.  Behavioral modification strategies: increasing lean protein intake, decreasing simple carbohydrates, increasing water intake, and better snacking choices.  Olivia Paul has agreed to follow-up with our clinic in 2 weeks. She was informed of the importance of frequent follow-up visits to maximize her success with intensive lifestyle modifications for her multiple health conditions.   Objective:   Blood pressure 119/79, pulse 68, temperature 98.1 F (36.7 C), height 5\' 1"  (1.549 m), weight 196 lb (88.9 kg), SpO2 97 %. Body mass index is 37.03 kg/m.  General: Cooperative, alert, well developed, in  no acute distress. HEENT: Conjunctivae and lids unremarkable. Cardiovascular: Regular rhythm.  Lungs: Normal work of breathing. Neurologic: No focal deficits.   Lab Results  Component Value Date   CREATININE 0.65 12/10/2020   BUN 8 12/10/2020   NA 142 12/10/2020   K 4.6 12/10/2020   CL 99 12/10/2020   CO2 26 12/10/2020   Lab Results  Component Value Date   ALT 13 12/10/2020   AST 14 12/10/2020   ALKPHOS 129 (H) 12/10/2020   BILITOT 0.5 12/10/2020   Lab Results  Component Value Date   HGBA1C 6.1 (H) 12/10/2020   HGBA1C 6.0 (H) 04/09/2020   HGBA1C 5.9 (H) 04/03/2019   HGBA1C 5.5 11/08/2018   HGBA1C 5.8 (H) 03/26/2018   Lab Results  Component Value Date   INSULIN 12.1 12/10/2020   Lab Results  Component Value Date   TSH 1.000 10/06/2020   Lab Results  Component Value Date   CHOL 196 12/10/2020   HDL 56 12/10/2020   LDLCALC 128 (H) 12/10/2020   TRIG 65 12/10/2020   CHOLHDL 3.4 04/09/2020   Lab Results  Component Value Date   VD25OH 47.4 12/10/2020   VD25OH 58.7 04/09/2020   VD25OH 57.5 04/03/2019   Lab Results  Component Value Date   WBC 10.2 10/06/2020   HGB 13.0 10/06/2020   HCT 41.0 10/06/2020   MCV 86 10/06/2020   PLT 430 10/06/2020   No results found for: IRON, TIBC, FERRITIN  Attestation Statements:  Reviewed by clinician on day of visit: allergies, medications, problem list, medical history, surgical history, family history, social history, and previous encounter notes.   Trude Mcburney, am acting as transcriptionist for [employer], DO.  I have reviewed the above documentation for accuracy and completeness, and I agree with the above. Carlye Grippe, D.O.  The 21st Century Cures Act was signed into law in 2016 which includes the topic of electronic health records.  This provides immediate access to information in MyChart.  This includes consultation notes, operative notes, office notes, lab results and pathology reports.  If you  have any questions about what you read please let us know at your next visit so we can discuss your concerns and take corrective action if need be.  We are right here with you.

## 2021-01-13 ENCOUNTER — Other Ambulatory Visit: Payer: Self-pay

## 2021-01-13 ENCOUNTER — Ambulatory Visit (INDEPENDENT_AMBULATORY_CARE_PROVIDER_SITE_OTHER): Payer: 59 | Admitting: Family Medicine

## 2021-01-13 ENCOUNTER — Encounter (INDEPENDENT_AMBULATORY_CARE_PROVIDER_SITE_OTHER): Payer: Self-pay | Admitting: Family Medicine

## 2021-01-13 VITALS — BP 131/85 | HR 59 | Temp 98.6°F | Ht 61.0 in | Wt 194.0 lb

## 2021-01-13 DIAGNOSIS — E7849 Other hyperlipidemia: Secondary | ICD-10-CM | POA: Diagnosis not present

## 2021-01-13 DIAGNOSIS — R7303 Prediabetes: Secondary | ICD-10-CM

## 2021-01-13 DIAGNOSIS — Z9189 Other specified personal risk factors, not elsewhere classified: Secondary | ICD-10-CM

## 2021-01-13 DIAGNOSIS — Z6837 Body mass index (BMI) 37.0-37.9, adult: Secondary | ICD-10-CM

## 2021-01-13 NOTE — Patient Instructions (Signed)
The 10-year ASCVD risk score (Arnett DK, et al., 2019) is: 7.5%   Values used to calculate the score:     Age: 65 years     Sex: Female     Is Non-Hispanic African American: Yes     Diabetic: No     Tobacco smoker: No     Systolic Blood Pressure: 131 mmHg     Is BP treated: No     HDL Cholesterol: 56 mg/dL     Total Cholesterol: 196 mg/dL

## 2021-01-14 ENCOUNTER — Ambulatory Visit (INDEPENDENT_AMBULATORY_CARE_PROVIDER_SITE_OTHER): Payer: 59 | Admitting: Internal Medicine

## 2021-01-14 ENCOUNTER — Encounter: Payer: Self-pay | Admitting: Internal Medicine

## 2021-01-14 VITALS — BP 122/64 | HR 84 | Temp 98.3°F | Ht 61.0 in | Wt 196.6 lb

## 2021-01-14 DIAGNOSIS — F419 Anxiety disorder, unspecified: Secondary | ICD-10-CM | POA: Diagnosis not present

## 2021-01-14 DIAGNOSIS — Z6837 Body mass index (BMI) 37.0-37.9, adult: Secondary | ICD-10-CM

## 2021-01-14 DIAGNOSIS — E6609 Other obesity due to excess calories: Secondary | ICD-10-CM

## 2021-01-14 DIAGNOSIS — E7800 Pure hypercholesterolemia, unspecified: Secondary | ICD-10-CM | POA: Diagnosis not present

## 2021-01-14 MED ORDER — DULOXETINE HCL 60 MG PO CPEP: ORAL_CAPSULE | ORAL | 2 refills | Status: DC

## 2021-01-14 MED ORDER — ATORVASTATIN CALCIUM 20 MG PO TABS: 20.0000 mg | ORAL_TABLET | Freq: Every day | ORAL | 11 refills | Status: DC

## 2021-01-14 NOTE — Progress Notes (Signed)
I,Tianna Badgett,acting as a Neurosurgeon for Gwynneth Aliment, MD.,have documented all relevant documentation on the behalf of Gwynneth Aliment, MD,as directed by  Gwynneth Aliment, MD while in the presence of Gwynneth Aliment, MD.  This visit occurred during the SARS-CoV-2 public health emergency.  Safety protocols were in place, including screening questions prior to the visit, additional usage of staff PPE, and extensive cleaning of exam room while observing appropriate contact time as indicated for disinfecting solutions.  Subjective:     Patient ID: Olivia Paul , female    DOB: 08/24/1955 , 65 y.o.   MRN: [id number]   Chief Complaint  Patient presents with   Anxiety    HPI  She is here today for f/u anxiety. She is not having any issues with duloxetine. She states that her Weight loss clinic provider wants her to take chol meds, but she wants to discuss further today.    Anxiety Presents for follow-up visit. Patient reports no feeling of choking, irritability, malaise or nausea.      Past Medical History:  Diagnosis Date   Arthritis    lt knee   Asthma 2011   Chest pain    Complication of anesthesia    Fatty liver    Headache    sinus   PONV (postoperative nausea and vomiting)    Pre-diabetes    Recurrent upper respiratory infection (URI)    Vertigo    Vitamin D deficiency      Family History  Problem Relation Age of Onset   Kidney disease Mother    Hypertension Mother    Allergic rhinitis Mother    Heart disease Father    Allergic rhinitis Father    Asthma Father    Urticaria Sister    Breast cancer Paternal Grandmother    Breast cancer Maternal Aunt    Breast cancer Paternal Aunt    Angioedema Neg Hx    Atopy Neg Hx    Eczema Neg Hx    Immunodeficiency Neg Hx      Current Outpatient Medications:    albuterol (PROVENTIL) (2.5 MG/3ML) 0.083% nebulizer solution, USE 1 VIAL EVERY 4-6 HOURS AS NEEDED FOR COUGH, WHEEZE, SHORTNESS OF BREATH, OR CHEST TIGHTNESS,  Disp: 150 mL, Rfl: 0   albuterol (VENTOLIN HFA) 108 (90 Base) MCG/ACT inhaler, Inhale 2 puffs into the lungs every 4 (four) hours as needed for wheezing or shortness of breath., Disp: 18 g, Rfl: 1   atorvastatin (LIPITOR) 20 MG tablet, Take 1 tablet (20 mg total) by mouth daily., Disp: 30 tablet, Rfl: 11   Cholecalciferol (VITAMIN D) 50 MCG (2000 UT) CAPS, Take 5,000 Units by mouth daily. , Disp: , Rfl:    clonazePAM (KLONOPIN) 0.5 MG tablet, TAKE 1 TABLET BY MOUTH TWICE A DAY AS NEEDED, Disp: 60 tablet, Rfl: 0   dexlansoprazole (DEXILANT) 60 MG capsule, Take 1 capsule (60 mg total) by mouth daily., Disp: 90 capsule, Rfl: 1   EPINEPHrine 0.3 mg/0.3 mL IJ SOAJ injection, Inject 0.3 mLs (0.3 mg total) into the muscle as needed for anaphylaxis., Disp: 1 each, Rfl: 1   fexofenadine (ALLEGRA) 180 MG tablet, TAKE ONE TABLET ONCE DAILY FOR RUNNY NOSE OR ITCHING, Disp: 90 tablet, Rfl: 1   fluticasone furoate-vilanterol (BREO ELLIPTA) 200-25 MCG/INH AEPB, Breo Ellipta 200 mcg-25 mcg/dose powder for inhalation  TAKE 1 PUFF BY MOUTH EVERY DAY, Disp: , Rfl:    gabapentin (NEURONTIN) 300 MG capsule, daily. , Disp: , Rfl:  ipratropium (ATROVENT) 0.06 % nasal spray, Place 2 sprays into both nostrils 4 (four) times daily as needed (Nasal congestion or drainage)., Disp: 15 mL, Rfl: 5   Krill Oil (OMEGA-3) 500 MG CAPS, Take 500 mg by mouth daily., Disp: , Rfl:    magnesium oxide (MAG-OX) 400 MG tablet, Take 400 mg by mouth daily., Disp: , Rfl:    meclizine (ANTIVERT) 50 MG tablet, Take 1/2 tab po qpm prn, Disp: 30 tablet, Rfl: 0   Menthol-Methyl Salicylate (TIGER BALM LINIMENT EX), Apply 1 application topically at bedtime as needed (pain)., Disp: , Rfl:    montelukast (SINGULAIR) 10 MG tablet, Take 1 tablet (10 mg total) by mouth at bedtime., Disp: 30 tablet, Rfl: 5   oxybutynin (DITROPAN-XL) 10 MG 24 hr tablet, Take 10 mg by mouth daily., Disp: , Rfl:    tiZANidine (ZANAFLEX) 4 MG tablet, Take 4 mg by mouth every  8 (eight) hours as needed., Disp: , Rfl:    traMADol (ULTRAM) 50 MG tablet, Take 50 mg by mouth every 8 (eight) hours as needed for moderate pain. , Disp: , Rfl:    TURMERIC PO, Take by mouth., Disp: , Rfl:    vitamin E 400 UNIT capsule, Take 400 Units by mouth daily., Disp: , Rfl:    DULoxetine (CYMBALTA) 60 MG capsule, TAKE 1 CAPSULE BY MOUTH EVERY DAY, Disp: 90 capsule, Rfl: 2   Allergies  Allergen Reactions   Meloxicam Other (See Comments)    Bruised her legs all over     Nsaids Other (See Comments)    Scarring and bruising of legs   Sulfa Antibiotics Other (See Comments)    Mouth broke out in sores   Oxycodone    Erythromycin Nausea And Vomiting     Review of Systems  Constitutional: Negative.  Negative for irritability.  Respiratory: Negative.    Cardiovascular: Negative.   Gastrointestinal: Negative.  Negative for nausea.  Neurological: Negative.     Today's Vitals   01/14/21 1042  BP: 122/64  Pulse: 84  Temp: 98.3 F (36.8 C)  TempSrc: Oral  Weight: 196 lb 9.6 oz (89.2 kg)  Height: 5\' 1"  (1.549 m)   Body mass index is 37.15 kg/m.  Wt Readings from Last 3 Encounters:  01/14/21 196 lb 9.6 oz (89.2 kg)  01/13/21 194 lb (88 kg)  12/24/20 196 lb (88.9 kg)    Objective:  Physical Exam Vitals and nursing note reviewed.  Constitutional:      Appearance: Normal appearance. She is obese.  HENT:     Head: Normocephalic and atraumatic.     Nose:     Comments: Masked     Mouth/Throat:     Comments: Masked  Eyes:     Extraocular Movements: Extraocular movements intact.  Cardiovascular:     Rate and Rhythm: Normal rate and regular rhythm.     Heart sounds: Normal heart sounds.  Pulmonary:     Effort: Pulmonary effort is normal.     Breath sounds: Normal breath sounds.  Musculoskeletal:     Cervical back: Normal range of motion.  Skin:    General: Skin is warm.  Neurological:     General: No focal deficit present.     Mental Status: She is alert.   Psychiatric:        Mood and Affect: Mood normal.        Behavior: Behavior normal.        Assessment And Plan:     1. Anxiety  Comments: Chronic, stable on meds. I will send rx duloxetine 60mg  daily. She will f/u in 3-4 months for re-evaluation.   2. Pure hypercholesterolemia Comments: Cardiac risk is calculated below. She agrees to start atorvastatin 20mg  daily. Advised of possible side effects. She will f/uin 6-8 wks for lipid check - Lipid panel; Future - ALT; Future  3. Class 2 obesity due to excess calories without serious comorbidity with body mass index (BMI) of 37.0 to 37.9 in adult Comments: She is now established at [facility]. Encouraged to incorporate more exercise into her daily routine.   She is encouraged to strive for BMI less than 30 to decrease cardiac risk. Advised to aim for at least 150 minutes of exercise per week.  The 10-year ASCVD risk score (Arnett DK, et al., 2019) is: 6.4%   Values used to calculate the score:     Age: 28 years     Sex: Female     Is Non-Hispanic African American: Yes     Diabetic: No     Tobacco smoker: No     Systolic Blood Pressure: 122 mmHg     Is BP treated: No     HDL Cholesterol: 56 mg/dL     Total Cholesterol: 196 mg/dL   Patient was given opportunity to ask questions. Patient verbalized understanding of the plan and was able to repeat key elements of the plan. All questions were answered to their satisfaction.   I, 2020, MD, have reviewed all documentation for this visit. The documentation on 01/14/21 for the exam, diagnosis, procedures, and orders are all accurate and complete.   IF YOU HAVE BEEN REFERRED TO A SPECIALIST, IT MAY TAKE 1-2 WEEKS TO SCHEDULE/PROCESS THE REFERRAL. IF YOU HAVE NOT HEARD FROM US/SPECIALIST IN TWO WEEKS, PLEASE GIVE Gwynneth Aliment A CALL AT [phone].   THE PATIENT IS ENCOURAGED TO PRACTICE SOCIAL DISTANCING DUE TO THE COVID-19 PANDEMIC.

## 2021-01-14 NOTE — Patient Instructions (Addendum)
The 10-year ASCVD risk score (Arnett DK, et al., 2019) is: 6.4%   Values used to calculate the score:     Age: 65 years     Sex: Female     Is Non-Hispanic African American: Yes     Diabetic: No     Tobacco smoker: No     Systolic Blood Pressure: 122 mmHg     Is BP treated: No     HDL Cholesterol: 56 mg/dL     Total Cholesterol: 196 mg/dL   Managing Anxiety, Adult After being diagnosed with anxiety, you may be relieved to know why you have felt or behaved a certain way. You may also feel overwhelmed about the treatment ahead and what it will mean for your life. With care and support, you can manage this condition. How to manage lifestyle changes Managing stress and anxiety Stress is your body's reaction to life changes and events, both good and bad. Most stress will last just a few hours, but stress can be ongoing and can lead to more than just stress. Although stress can play a major role in anxiety, it is not the same as anxiety. Stress is usually caused by something external, such as a deadline, test, or competition. Stress normally passes after the triggering event has ended.  Anxiety is caused by something internal, such as imagining a terrible outcome or worrying that something will go wrong that will devastate you. Anxiety often does not go away even after the triggering event is over, and it can become long-term (chronic) worry. It is important to understand the differences between stress and anxiety and to manage your stress effectively so that it does not lead to an anxious response. Talk with your health care provider or a counselor to learn more about reducing anxiety and stress. He or she may suggest tension reduction techniques, such as: Music therapy. Spend time creating or listening to music that you enjoy and that inspires you. Mindfulness-based meditation. Practice being aware of your normal breaths while not trying to control your breathing. It can be done while sitting or  walking. Centering prayer. This involves focusing on a word, phrase, or sacred image that means something to you and brings you peace. Deep breathing. To do this, expand your stomach and inhale slowly through your nose. Hold your breath for 3-5 seconds. Then exhale slowly, letting your stomach muscles relax. Self-talk. Learn to notice and identify thought patterns that lead to anxiety reactions and change those patterns to thoughts that feel peaceful. Muscle relaxation. Taking time to tense muscles and then relax them. Choose a tension reduction technique that fits your lifestyle and personality. These techniques take time and practice. Set aside 5-15 minutes a day to do them. Therapists can offer counseling and training in these techniques. The training to help with anxiety may be covered by some insurance plans. Other things you can do to manage stress and anxiety include: Keeping a stress diary. This can help you learn what triggers your reaction and then learn ways to manage your response. Thinking about how you react to certain situations. You may not be able to control everything, but you can control your response. Making time for activities that help you relax and not feeling guilty about spending your time in this way. Doing visual imagery. This involves imagining or creating mental pictures to help you relax. Practicing yoga. Through yoga poses, you can lower tension and promote relaxation.  Medicines Medicines can help ease symptoms. Medicines for anxiety  include: Antidepressant medicines. These are usually prescribed for long-term daily control. Anti-anxiety medicines. These may be added in severe cases, especially when panic attacks occur. Medicines will be prescribed by a health care provider. When used together, medicines, psychotherapy, and tension reduction techniques may be the most effective treatment. Relationships Relationships can play a big part in helping you recover. Try to  spend more time connecting with trusted friends and family members. Consider going to couples counseling if you have a partner, taking family education classes, or going to family therapy. Therapy can help you and others better understand your condition. How to recognize changes in your anxiety Everyone responds differently to treatment for anxiety. Recovery from anxiety happens when symptoms decrease and stop interfering with your daily activities at home or work. This may mean that you will start to: Have better concentration and focus. Worry will interfere less in your daily thinking. Sleep better. Be less irritable. Have more energy. Have improved memory. It is also important to recognize when your condition is getting worse. Contact your health care provider if your symptoms interfere with home or work and you feel like your condition is not improving. Follow these instructions at home: Activity Exercise. Adults should do the following: Exercise for at least 150 minutes each week. The exercise should increase your heart rate and make you sweat (moderate-intensity exercise). Strengthening exercises at least twice a week. Get the right amount and quality of sleep. Most adults need 7-9 hours of sleep each night. Lifestyle  Eat a healthy diet that includes plenty of vegetables, fruits, whole grains, low-fat dairy products, and lean protein. Do not eat a lot of foods that are high in fats, added sugars, or salt (sodium). Make choices that simplify your life. Do not use any products that contain nicotine or tobacco. These products include cigarettes, chewing tobacco, and vaping devices, such as e-cigarettes. If you need help quitting, ask your health care provider. Avoid caffeine, alcohol, and certain over-the-counter cold medicines. These may make you feel worse. Ask your pharmacist which medicines to avoid. General instructions Take over-the-counter and prescription medicines only as told  by your health care provider. Keep all follow-up visits. This is important. Where to find support You can get help and support from these sources: Self-help groups. Online and [employer]. A trusted spiritual leader. Couples counseling. Family education classes. Family therapy. Where to find more information You may find that joining a support group helps you deal with your anxiety. The following sources can help you locate counselors or support groups near you: Mental Health America: www.mentalhealthamerica.net Anxiety and Depression Association of [place] (ADAA): ProgramCam.de [employer] on Mental Illness (NAMI): www.nami.org Contact a health care provider if: You have a hard time staying focused or finishing daily tasks. You spend many hours a day feeling worried about everyday life. You become exhausted by worry. You start to have headaches or frequently feel tense. You develop chronic nausea or diarrhea. Get help right away if: You have a racing heart and shortness of breath. You have thoughts of hurting yourself or others. If you ever feel like you may hurt yourself or others, or have thoughts about taking your own life, get help right away. Go to your nearest emergency department or: Call your local emergency services (911 in the [place] Call a suicide crisis helpline, such as the National Suicide Prevention Lifeline at (986) 093-3977 or 988 in the [place] This is open 24 hours a day in the [place] Text the Crisis Text Line  at 360-807-9440 (in the [place] Summary Taking steps to learn and use tension reduction techniques can help calm you and help prevent triggering an anxiety reaction. When used together, medicines, psychotherapy, and tension reduction techniques may be the most effective treatment. Family, friends, and partners can play a big part in supporting you. This information is not intended to replace advice given to you by your health care provider. Make sure you  discuss any questions you have with your health care provider. Document Revised: 08/26/2020 Document Reviewed: 05/24/2020 Elsevier Patient Education  2022 [employer].

## 2021-01-14 NOTE — Progress Notes (Signed)
Chief Complaint:   OBESITY Olivia Paul is here to discuss her progress with her obesity treatment plan along with follow-up of her obesity related diagnoses. Olivia Paul is on the Category 1 Plan and states she is following her eating plan approximately 80% of the time. Olivia Paul states she is doing 0 minutes 0 times per week.  Today's visit was #: 3 Starting weight: 198 lbs Starting date: 12/10/2020 Today's weight: 194 lbs Today's date: 01/13/2021 Total lbs lost to date: 4 Total lbs lost since last in-office visit: 2  Interim History: Rane states that "she hates the meal plan, as it is a struggle to get all of the meat in". She notes she cannot eat 10 oz of meat everyday. She doesn't like repetition and she needs more variety.  Subjective:   1. Other hyperlipidemia Olivia Paul ASCVD risk score is >7.5%. She has not changed her mind on starting statin. She has an appointment with her primary care physician this week, and she wishes to discuss this with her.   2. Pre-diabetes Olivia Paul has a diagnosis of pre-diabetes based on her elevated HgA1c and was informed this puts her at greater risk of developing diabetes. She is not on medications currently, and she denies cravings or hunger.   3. At risk for heart disease Olivia Paul is at a higher than average risk for cardiovascular disease due to obesity.   Assessment/Plan:  No orders of the defined types were placed in this encounter.   There are no discontinued medications.   No orders of the defined types were placed in this encounter.    1. Other hyperlipidemia Cardiovascular risk and specific lipid/LDL goals reviewed. We discussed several lifestyle modifications today. I discussed foods that increases LDL with the patient today. Olivia Paul will speak with her primary care physician in regards to statin medications. I reviewed her ASCVD risk score with her again. Orders and follow up as documented in patient record.    Counseling Intensive lifestyle modifications are the first line treatment for this issue. Dietary changes: Increase soluble fiber. Decrease simple carbohydrates. Exercise changes: Moderate to vigorous-intensity aerobic activity 150 minutes per week if tolerated. Lipid-lowering medications: see documented in medical record.  2. Pre-diabetes Olivia Paul will continue to work on weight loss, exercise, and decreasing simple carbohydrates to help decrease the risk of diabetes.   3. At risk for heart disease Due to Dayami's current state of health and medical condition(s), she is at a higher risk for heart disease. This puts the patient at much greater risk to subsequently develop cardiopulmonary conditions that can significantly affect patient's quality of life in a negative manner.    At least 10 minutes were spent on counseling Olivia Paul about these concerns today, and I stressed the importance of reversing risks factors of obesity, especially truncal and visceral fat, hypertension, hyperlipidemia, and pre-diabetes.  The initial goal is to lose at least 5-10% of starting weight to help reduce these risk factors.  Counseling:  Intensive lifestyle modifications were discussed with Olivia Paul as the most appropriate first line of treatment.  she will continue to work on diet, exercise, and weight loss efforts.  We will continue to reassess these conditions on a fairly regular basis in an attempt to decrease the patient's overall morbidity and mortality.  Evidence-based interventions for health behavior change were utilized today including the discussion of self monitoring techniques, problem-solving barriers, and SMART goal setting techniques.  Specifically, regarding patient's less desirable eating habits and patterns, we employed the technique of small  changes when Traeh has not been able to fully commit to her prudent nutritional plan.  4. Obesity with current BMI of 36.8 Olivia Paul is currently in  the action stage of change. As such, her goal is to continue with weight loss efforts. She has agreed to change to keeping a food journal and adhering to recommended goals of [phone] calories and 75+ grams of protein daily.   Olivia Paul is to bring in her log to her next office visit of daily calories and protein totals.   Exercise goals: As is.  Behavioral modification strategies: increasing lean protein intake, decreasing simple carbohydrates, and keeping a strict food journal.  Olivia Paul has agreed to follow-up with our clinic in 2 weeks. She was informed of the importance of frequent follow-up visits to maximize her success with intensive lifestyle modifications for her multiple health conditions.   Objective:   Blood pressure 131/85, pulse (!) 59, temperature 98.6 F (37 C), height 5\' 1"  (1.549 m), weight 194 lb (88 kg), SpO2 97 %. Body mass index is 36.66 kg/m.  General: Cooperative, alert, well developed, in no acute distress. HEENT: Conjunctivae and lids unremarkable. Cardiovascular: Regular rhythm.  Lungs: Normal work of breathing. Neurologic: No focal deficits.   Lab Results  Component Value Date   CREATININE 0.65 12/10/2020   BUN 8 12/10/2020   NA 142 12/10/2020   K 4.6 12/10/2020   CL 99 12/10/2020   CO2 26 12/10/2020   Lab Results  Component Value Date   ALT 13 12/10/2020   AST 14 12/10/2020   ALKPHOS 129 (H) 12/10/2020   BILITOT 0.5 12/10/2020   Lab Results  Component Value Date   HGBA1C 6.1 (H) 12/10/2020   HGBA1C 6.0 (H) 04/09/2020   HGBA1C 5.9 (H) 04/03/2019   HGBA1C 5.5 11/08/2018   HGBA1C 5.8 (H) 03/26/2018   Lab Results  Component Value Date   INSULIN 12.1 12/10/2020   Lab Results  Component Value Date   TSH 1.000 10/06/2020   Lab Results  Component Value Date   CHOL 196 12/10/2020   HDL 56 12/10/2020   LDLCALC 128 (H) 12/10/2020   TRIG 65 12/10/2020   CHOLHDL 3.4 04/09/2020   Lab Results  Component Value Date   VD25OH 47.4 12/10/2020    VD25OH 58.7 04/09/2020   VD25OH 57.5 04/03/2019   Lab Results  Component Value Date   WBC 10.2 10/06/2020   HGB 13.0 10/06/2020   HCT 41.0 10/06/2020   MCV 86 10/06/2020   PLT 430 10/06/2020   No results found for: IRON, TIBC, FERRITIN  Attestation Statements:   Reviewed by clinician on day of visit: allergies, medications, problem list, medical history, surgical history, family history, social history, and previous encounter notes.   10/08/2020, am acting as transcriptionist for Trude Mcburney, DO.  I have reviewed the above documentation for accuracy and completeness, and I agree with the above. [employer], D.O.  The 21st Century Cures Act was signed into law in 2016 which includes the topic of electronic health records.  This provides immediate access to information in MyChart.  This includes consultation notes, operative notes, office notes, lab results and pathology reports.  If you have any questions about what you read please let 2017 know at your next visit so we can discuss your concerns and take corrective action if need be.  We are right here with you.

## 2021-01-26 ENCOUNTER — Other Ambulatory Visit: Payer: Self-pay | Admitting: Internal Medicine

## 2021-01-26 DIAGNOSIS — Z1231 Encounter for screening mammogram for malignant neoplasm of breast: Secondary | ICD-10-CM

## 2021-01-27 ENCOUNTER — Ambulatory Visit (INDEPENDENT_AMBULATORY_CARE_PROVIDER_SITE_OTHER): Payer: 59 | Admitting: Family Medicine

## 2021-01-27 ENCOUNTER — Encounter (INDEPENDENT_AMBULATORY_CARE_PROVIDER_SITE_OTHER): Payer: Self-pay | Admitting: Family Medicine

## 2021-01-27 ENCOUNTER — Other Ambulatory Visit: Payer: Self-pay

## 2021-01-27 VITALS — BP 134/82 | HR 63 | Temp 98.1°F | Ht 61.0 in | Wt 195.0 lb

## 2021-01-27 DIAGNOSIS — E7849 Other hyperlipidemia: Secondary | ICD-10-CM | POA: Diagnosis not present

## 2021-01-27 DIAGNOSIS — Z9189 Other specified personal risk factors, not elsewhere classified: Secondary | ICD-10-CM

## 2021-01-27 DIAGNOSIS — Z6837 Body mass index (BMI) 37.0-37.9, adult: Secondary | ICD-10-CM

## 2021-01-28 NOTE — Progress Notes (Signed)
Chief Complaint:   OBESITY Olivia Paul is here to discuss her progress with her obesity treatment plan along with follow-up of her obesity related diagnoses. Olivia Paul is on keeping a food journal and adhering to recommended goals of [phone] calories and 75+ grams of protein daily and states she is following her eating plan approximately 0% of the time. Olivia Paul states she is not currently exercising.  Today's visit was #: 4 Starting weight: 198 lbs Starting date: 12/10/2020 Today's weight: 195 lbs Today's date: 01/27/2021 Total lbs lost to date: 3 Total lbs lost since last in-office visit: 0  Interim History: Olivia Paul is not journaling and she doesn't have a log. Her protein is still a struggle. Last office visit we changed from Category 1 to journaling because she "hated the plan". She is still eating sweets (rice krispy treats 2 times yesterday). She is just "eating healthy" and she is not following the plan or journaling. For breakfast today, she had a peppermint latte at [employer] with a breakfast sandwich from there.  Subjective:   1. Other hyperlipidemia Olivia Paul is taking Lipitor. She has no issues with her medication, and her compliance is good.  2. At risk for malnutrition Olivia Paul is at increased risk for malnutrition due to skipping meals/food.  Assessment/Plan:  No orders of the defined types were placed in this encounter.   There are no discontinued medications.   No orders of the defined types were placed in this encounter.    1. Other hyperlipidemia I reminded Olivia Paul of the importance to decrease saturated and trans fats. She is to avoid fatty and greasy protein.  2. At risk for malnutrition Olivia Paul was given approximately 9 minutes of counseling today regarding prevention of malnutrition and ways to meet macronutrient goals..   3. Obesity with current BMI of 36.9 Olivia Paul is currently in the action stage of change. As such, her goal is to continue  with weight loss efforts. She has agreed to keeping a food journal and adhering to recommended goals of [phone] calories and 75+ grams of protein daily.   I gave the patient holiday eating strategies guide and recipes.  Exercise goals: As is.  Behavioral modification strategies: holiday eating strategies .  Olivia Paul has agreed to follow-up with our clinic in 3 to 4 weeks. She was informed of the importance of frequent follow-up visits to maximize her success with intensive lifestyle modifications for her multiple health conditions.   Objective:   Blood pressure 134/82, pulse 63, temperature 98.1 F (36.7 C), height 5\' 1"  (1.549 m), weight 195 lb (88.5 kg), SpO2 98 %. Body mass index is 36.84 kg/m.  General: Cooperative, alert, well developed, in no acute distress. HEENT: Conjunctivae and lids unremarkable. Cardiovascular: Regular rhythm.  Lungs: Normal work of breathing. Neurologic: No focal deficits.   Lab Results  Component Value Date   CREATININE 0.65 12/10/2020   BUN 8 12/10/2020   NA 142 12/10/2020   K 4.6 12/10/2020   CL 99 12/10/2020   CO2 26 12/10/2020   Lab Results  Component Value Date   ALT 13 12/10/2020   AST 14 12/10/2020   ALKPHOS 129 (H) 12/10/2020   BILITOT 0.5 12/10/2020   Lab Results  Component Value Date   HGBA1C 6.1 (H) 12/10/2020   HGBA1C 6.0 (H) 04/09/2020   HGBA1C 5.9 (H) 04/03/2019   HGBA1C 5.5 11/08/2018   HGBA1C 5.8 (H) 03/26/2018   Lab Results  Component Value Date   INSULIN 12.1 12/10/2020   Lab  Results  Component Value Date   TSH 1.000 10/06/2020   Lab Results  Component Value Date   CHOL 196 12/10/2020   HDL 56 12/10/2020   LDLCALC 128 (H) 12/10/2020   TRIG 65 12/10/2020   CHOLHDL 3.4 04/09/2020   Lab Results  Component Value Date   VD25OH 47.4 12/10/2020   VD25OH 58.7 04/09/2020   VD25OH 57.5 04/03/2019   Lab Results  Component Value Date   WBC 10.2 10/06/2020   HGB 13.0 10/06/2020   HCT 41.0 10/06/2020   MCV 86  10/06/2020   PLT 430 10/06/2020   No results found for: IRON, TIBC, FERRITIN  Attestation Statements:   Reviewed by clinician on day of visit: allergies, medications, problem list, medical history, surgical history, family history, social history, and previous encounter notes.   Trude Mcburney, am acting as transcriptionist for [employer], DO.  I have reviewed the above documentation for accuracy and completeness, and I agree with the above. Carlye Grippe, D.O.  The 21st Century Cures Act was signed into law in 2016 which includes the topic of electronic health records.  This provides immediate access to information in MyChart.  This includes consultation notes, operative notes, office notes, lab results and pathology reports.  If you have any questions about what you read please let us know at your next visit so we can discuss your concerns and take corrective action if need be.  We are right here with you.

## 2021-01-29 DIAGNOSIS — M5416 Radiculopathy, lumbar region: Secondary | ICD-10-CM | POA: Insufficient documentation

## 2021-02-18 ENCOUNTER — Encounter: Payer: Self-pay | Admitting: Allergy

## 2021-02-18 ENCOUNTER — Other Ambulatory Visit: Payer: Self-pay

## 2021-02-18 ENCOUNTER — Ambulatory Visit: Payer: 59 | Admitting: Allergy

## 2021-02-18 VITALS — BP 140/84 | HR 106 | Temp 98.1°F | Resp 18 | Ht 60.0 in | Wt 201.8 lb

## 2021-02-18 DIAGNOSIS — J3089 Other allergic rhinitis: Secondary | ICD-10-CM | POA: Diagnosis not present

## 2021-02-18 DIAGNOSIS — H1013 Acute atopic conjunctivitis, bilateral: Secondary | ICD-10-CM

## 2021-02-18 DIAGNOSIS — J4540 Moderate persistent asthma, uncomplicated: Secondary | ICD-10-CM

## 2021-02-18 MED ORDER — ALBUTEROL SULFATE (2.5 MG/3ML) 0.083% IN NEBU: INHALATION_SOLUTION | RESPIRATORY_TRACT | 0 refills | Status: DC

## 2021-02-18 MED ORDER — FEXOFENADINE HCL 180 MG PO TABS: ORAL_TABLET | ORAL | 1 refills | Status: DC

## 2021-02-18 NOTE — Progress Notes (Signed)
Follow-up Note  RE: [name] MRN: [id number] DOB: 14-Nov-1955 Date of Office Visit: 02/18/2021   History of present illness: Olivia Paul is a 66 y.o. female presenting today for follow-up of asthma and allergic rhinitis.  She was last seen in the office on 11/13/20 by myself.  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 She is still reporting headaches.  She also states her ears ave been hurting which is new for her.  The ear pain started up around/after christmas.  Ear pain is more right ear than left ear where she feels like she has fluid in her ear and is painful. Does not report ringing or popping of the ear.  She states she is not consistent with her medication use but knows she does need to be more consistent.  She notes when she uses the Atrovent nasal spray it is helpful with drainage/congestion.  She is consistent with singulair daily use however.  She also has allegra as her antihistamine.  For her asthma control she has Breo that she states will be more consistent.  She does note asthma symptoms with the changes in the weather.   She does have an ENT, Dr. Christell Constant, if she needs to see them if having persistent ear pain.   Review of systems: Review of Systems  Constitutional: Negative.   HENT:  Positive for congestion and ear pain.   Eyes: Negative.   Respiratory:  Positive for cough and shortness of breath.   Cardiovascular: Negative.   Gastrointestinal: Negative.   Musculoskeletal: Negative.   Skin: Negative.   Allergic/Immunologic: Negative.   Neurological:  Positive for headaches.    All other systems negative unless noted above in HPI  Past medical/social/surgical/family history have been reviewed and are unchanged unless specifically indicated below.  No changes  Medication List: Current Outpatient Medications  Medication Sig Dispense Refill   albuterol (PROVENTIL) (2.5 MG/3ML) 0.083% nebulizer solution USE 1 VIAL EVERY 4-6 HOURS AS  NEEDED FOR COUGH, WHEEZE, SHORTNESS OF BREATH, OR CHEST TIGHTNESS 150 mL 0   albuterol (VENTOLIN HFA) 108 (90 Base) MCG/ACT inhaler Inhale 2 puffs into the lungs every 4 (four) hours as needed for wheezing or shortness of breath. 18 g 1   atorvastatin (LIPITOR) 20 MG tablet Take 1 tablet (20 mg total) by mouth daily. 30 tablet 11   Cholecalciferol (VITAMIN D) 50 MCG (2000 UT) CAPS Take 5,000 Units by mouth daily.      clonazePAM (KLONOPIN) 0.5 MG tablet TAKE 1 TABLET BY MOUTH TWICE A DAY AS NEEDED 60 tablet 0   dexlansoprazole (DEXILANT) 60 MG capsule Take 1 capsule (60 mg total) by mouth daily. 90 capsule 1   DULoxetine (CYMBALTA) 60 MG capsule TAKE 1 CAPSULE BY MOUTH EVERY DAY 90 capsule 2   EPINEPHrine 0.3 mg/0.3 mL IJ SOAJ injection Inject 0.3 mLs (0.3 mg total) into the muscle as needed for anaphylaxis. 1 each 1   fexofenadine (ALLEGRA) 180 MG tablet TAKE ONE TABLET ONCE DAILY FOR RUNNY NOSE OR ITCHING 90 tablet 1   fluticasone furoate-vilanterol (BREO ELLIPTA) 200-25 MCG/INH AEPB Breo Ellipta 200 mcg-25 mcg/dose powder for inhalation  TAKE 1 PUFF BY MOUTH EVERY DAY     gabapentin (NEURONTIN) 300 MG capsule daily.      ipratropium (ATROVENT) 0.06 % nasal spray Place 2 sprays into both nostrils 4 (four) times daily as needed (Nasal congestion or drainage). 15 mL 5   Krill Oil (OMEGA-3) 500 MG CAPS  Take 500 mg by mouth daily.     magnesium oxide (MAG-OX) 400 MG tablet Take 400 mg by mouth daily.     meclizine (ANTIVERT) 50 MG tablet Take 1/2 tab po qpm prn 30 tablet 0   Menthol-Methyl Salicylate (TIGER BALM LINIMENT EX) Apply 1 application topically at bedtime as needed (pain).     montelukast (SINGULAIR) 10 MG tablet Take 1 tablet (10 mg total) by mouth at bedtime. 30 tablet 5   oxybutynin (DITROPAN-XL) 10 MG 24 hr tablet Take 10 mg by mouth daily.     tiZANidine (ZANAFLEX) 4 MG tablet Take 4 mg by mouth every 8 (eight) hours as needed.     traMADol (ULTRAM) 50 MG tablet Take 50 mg by mouth  every 8 (eight) hours as needed for moderate pain.      TURMERIC PO Take by mouth.     vitamin E 400 UNIT capsule Take 400 Units by mouth daily.     No current facility-administered medications for this visit.     Known medication allergies: Allergies  Allergen Reactions   Meloxicam Other (See Comments)    Bruised her legs all over     Nsaids Other (See Comments)    Scarring and bruising of legs   Sulfa Antibiotics Other (See Comments)    Mouth broke out in sores   Oxycodone    Erythromycin Nausea And Vomiting     Physical examination: Blood pressure 140/84, pulse (!) 106, temperature 98.1 F (36.7 C), resp. rate 18, height 5' (1.524 m), weight 201 lb 12.8 oz (91.5 kg), SpO2 98 %.  General: Alert, interactive, in no acute distress. HEENT: PERRLA, TMs pearly gray, turbinates moderately edematous without discharge, post-pharynx non erythematous. Neck: Supple without lymphadenopathy. Lungs: Clear to auscultation without wheezing, rhonchi or rales. {no increased work of breathing. CV: Normal S1, S2 without murmurs. Abdomen: Nondistended, nontender. Skin: Warm and dry, without lesions or rashes. Extremities:  No clubbing, cyanosis or edema. Neuro:   Grossly intact.  Diagnositics/Labs:  Spirometry: FEV1: 1.57L 88%, FVC: 1.79L 80%, ratio consistent with nonobstructive pattern  Assessment and plan: Asthma - use Breo 1 puff once a day.  We discussed to be more consistent with daily use especially with the weather changes - albuterol as needed 2 puffs every 4-6 hours for cough, wheeze, chest tightness, difficulty breathing - continue singulair 10mg  at bedtime daily - your lung function testing looks great today!  Asthma control goals:  Full participation in all desired activities (may need albuterol before activity) Albuterol use two time or less a week on average (not counting use with activity) Cough interfering with sleep two time or less a month Oral steroids no  more than once a year No hospitalizations  Allergies - continue singulair as above - use Allegra 180mg  daily at this time - use nasal Atrovent 1-2 sprays each nostril daily at this time (for at least a week at a time) and can use up to 3-4 times a day as needed for nasal drainage or congestion.  This should help decrease nasal and ear symptoms.   If ear symptoms persist despite adequate use then may need to see your ENT, Dr. to evaluate your inner ear   Follow-up in 4-6 months or sooner if needed    I appreciate the opportunity to take part in Lanisa's care. Please do not hesitate to contact me with questions.  Sincerely,   , MD Allergy/Immunology [facility]

## 2021-02-18 NOTE — Patient Instructions (Addendum)
Asthma - use Breo 1 puff once a day.  We discussed to be more consistent with daily use especially with the weather changes - albuterol as needed 2 puffs every 4-6 hours for cough, wheeze, chest tightness, difficulty breathing - continue singulair 10mg  at bedtime daily - your lung function testing looks great today!  Asthma control goals:  Full participation in all desired activities (may need albuterol before activity) Albuterol use two time or less a week on average (not counting use with activity) Cough interfering with sleep two time or less a month Oral steroids no more than once a year No hospitalizations  Allergies - continue singulair as above - use Allegra 180mg  daily at this time - use nasal Atrovent 1-2 sprays each nostril daily at this time (for at least a week at a time) and can use up to 3-4 times a day as needed for nasal drainage or congestion.  This should help decrease nasal and ear symptoms.   If ear symptoms persist despite adequate use then may need to see your ENT, Dr. to evaluate your inner ear   Follow-up in 4-6 months or sooner if needed

## 2021-02-22 ENCOUNTER — Ambulatory Visit (INDEPENDENT_AMBULATORY_CARE_PROVIDER_SITE_OTHER): Payer: 59 | Admitting: Physician Assistant

## 2021-02-24 ENCOUNTER — Other Ambulatory Visit: Payer: 59

## 2021-02-24 ENCOUNTER — Other Ambulatory Visit: Payer: Self-pay

## 2021-02-24 DIAGNOSIS — E7800 Pure hypercholesterolemia, unspecified: Secondary | ICD-10-CM

## 2021-02-24 DIAGNOSIS — Z1231 Encounter for screening mammogram for malignant neoplasm of breast: Secondary | ICD-10-CM

## 2021-02-24 LAB — LIPID PANEL
Chol/HDL Ratio: 2.9 ratio (ref 0.0–4.4)
Cholesterol, Total: 156 mg/dL (ref 100–199)
HDL: 54 mg/dL (ref >39)
LDL Chol Calc (NIH): 91 mg/dL (ref 0–99)
Triglycerides: 51 mg/dL (ref 0–149)
VLDL Cholesterol Cal: 11 mg/dL (ref 5–40)

## 2021-02-24 LAB — ALT: ALT: 14 IU/L (ref 0–32)

## 2021-02-26 ENCOUNTER — Ambulatory Visit
Admission: RE | Admit: 2021-02-26 | Discharge: 2021-02-26 | Disposition: A | Discharge status: Home / Self Care | Payer: 59 | Source: Ambulatory Visit | Attending: Internal Medicine | Admitting: Internal Medicine

## 2021-03-01 ENCOUNTER — Ambulatory Visit (INDEPENDENT_AMBULATORY_CARE_PROVIDER_SITE_OTHER): Payer: 59 | Admitting: Physician Assistant

## 2021-03-01 ENCOUNTER — Other Ambulatory Visit: Payer: Self-pay

## 2021-03-01 ENCOUNTER — Encounter (INDEPENDENT_AMBULATORY_CARE_PROVIDER_SITE_OTHER): Payer: Self-pay | Admitting: Physician Assistant

## 2021-03-01 VITALS — BP 130/87 | HR 77 | Temp 98.9°F | Ht 60.0 in | Wt 192.0 lb

## 2021-03-01 DIAGNOSIS — R7303 Prediabetes: Secondary | ICD-10-CM

## 2021-03-01 DIAGNOSIS — Z6837 Body mass index (BMI) 37.0-37.9, adult: Secondary | ICD-10-CM | POA: Diagnosis not present

## 2021-03-01 DIAGNOSIS — E66812 Obesity, class 2: Secondary | ICD-10-CM

## 2021-03-01 NOTE — Progress Notes (Signed)
Chief Complaint:   OBESITY Olivia Paul is here to discuss her progress with her obesity treatment plan along with follow-up of her obesity related diagnoses. Olivia Paul is on keeping a food journal and adhering to recommended goals of [phone] calories and 75+ grams protein and states she is following her eating plan approximately 30% of the time. Olivia Paul states she is not currently exercising.  Today's visit was #: 5 Starting weight: 198 lbs Starting date: 12/10/2020 Today's weight: 192 lbs Today's date: 03/01/2021 Total lbs lost to date: 6 Total lbs lost since last in-office visit: 3  Interim History: Pt does not journal, as she does not like journaling. She did not like the amount of meat on category 1. She notes that she felt like she did not want to continue with the program but "I am here today." She is looking for a different plan.  Subjective:   1. Pre-diabetes Pt's last A1c was 6.1 and she is not on meds.  Assessment/Plan:   1. Pre-diabetes Olivia Paul will continue to work on weight loss, exercise, and decreasing simple carbohydrates to help decrease the risk of diabetes. Continue current treatment plan.  2. Obesity with current BMI of 37.5  Olivia Paul is currently in the action stage of change. As such, her goal is to continue with weight loss efforts. She has agreed to the [employer].   Exercise goals: All adults should avoid inactivity. Some physical activity is better than none, and adults who participate in any amount of physical activity gain some health benefits.  Behavioral modification strategies: meal planning and cooking strategies and keeping healthy foods in the home.  Olivia Paul has agreed to follow-up with our clinic in 2 weeks. She was informed of the importance of frequent follow-up visits to maximize her success with intensive lifestyle modifications for her multiple health conditions.   Objective:   Blood pressure 130/87, pulse 77, temperature 98.9  F (37.2 C), height 5' (1.524 m), weight 192 lb (87.1 kg), SpO2 96 %. Body mass index is 37.5 kg/m.  General: Cooperative, alert, well developed, in no acute distress. HEENT: Conjunctivae and lids unremarkable. Cardiovascular: Regular rhythm.  Lungs: Normal work of breathing. Neurologic: No focal deficits.   Lab Results  Component Value Date   CREATININE 0.65 12/10/2020   BUN 8 12/10/2020   NA 142 12/10/2020   K 4.6 12/10/2020   CL 99 12/10/2020   CO2 26 12/10/2020   Lab Results  Component Value Date   ALT 14 02/24/2021   AST 14 12/10/2020   ALKPHOS 129 (H) 12/10/2020   BILITOT 0.5 12/10/2020   Lab Results  Component Value Date   HGBA1C 6.1 (H) 12/10/2020   HGBA1C 6.0 (H) 04/09/2020   HGBA1C 5.9 (H) 04/03/2019   HGBA1C 5.5 11/08/2018   HGBA1C 5.8 (H) 03/26/2018   Lab Results  Component Value Date   INSULIN 12.1 12/10/2020   Lab Results  Component Value Date   TSH 1.000 10/06/2020   Lab Results  Component Value Date   CHOL 156 02/24/2021   HDL 54 02/24/2021   LDLCALC 91 02/24/2021   TRIG 51 02/24/2021   CHOLHDL 2.9 02/24/2021   Lab Results  Component Value Date   VD25OH 47.4 12/10/2020   VD25OH 58.7 04/09/2020   VD25OH 57.5 04/03/2019   Lab Results  Component Value Date   WBC 10.2 10/06/2020   HGB 13.0 10/06/2020   HCT 41.0 10/06/2020   MCV 86 10/06/2020   PLT 430 10/06/2020  Attestation Statements:   Reviewed by clinician on day of visit: allergies, medications, problem list, medical history, surgical history, family history, social history, and previous encounter notes.  Time spent on visit including pre-visit chart review and post-visit care and charting was 31 minutes.   Edmund Hilda, CMA, am acting as transcriptionist for [employer], PA-C.  I have reviewed the above documentation for accuracy and completeness, and I agree with the above. Alois Cliche, PA-C

## 2021-03-11 ENCOUNTER — Other Ambulatory Visit: Payer: Self-pay | Admitting: Allergy

## 2021-03-12 ENCOUNTER — Other Ambulatory Visit: Payer: Self-pay | Admitting: Allergy

## 2021-03-12 ENCOUNTER — Other Ambulatory Visit: Payer: Self-pay

## 2021-03-15 ENCOUNTER — Encounter (INDEPENDENT_AMBULATORY_CARE_PROVIDER_SITE_OTHER): Payer: Self-pay | Admitting: Physician Assistant

## 2021-03-15 ENCOUNTER — Ambulatory Visit (INDEPENDENT_AMBULATORY_CARE_PROVIDER_SITE_OTHER): Payer: 59 | Admitting: Physician Assistant

## 2021-03-15 ENCOUNTER — Other Ambulatory Visit: Payer: Self-pay

## 2021-03-15 VITALS — BP 129/76 | HR 91 | Temp 98.4°F | Ht 60.0 in | Wt 196.0 lb

## 2021-03-15 DIAGNOSIS — Z6838 Body mass index (BMI) 38.0-38.9, adult: Secondary | ICD-10-CM

## 2021-03-15 DIAGNOSIS — E559 Vitamin D deficiency, unspecified: Secondary | ICD-10-CM | POA: Diagnosis not present

## 2021-03-15 DIAGNOSIS — E669 Obesity, unspecified: Secondary | ICD-10-CM | POA: Diagnosis not present

## 2021-03-15 DIAGNOSIS — Z6837 Body mass index (BMI) 37.0-37.9, adult: Secondary | ICD-10-CM

## 2021-03-15 NOTE — Progress Notes (Signed)
Chief Complaint:   OBESITY Olivia Paul is here to discuss her progress with her obesity treatment plan along with follow-up of her obesity related diagnoses. Olivia Paul is on the [employer] and states she is following her eating plan approximately 90% of the time. Olivia Paul states she is doing 0 minutes 0 times per week.  Today's visit was #: 6 Starting weight: 198 lbs Starting date: 12/10/2020 Today's weight: 196 lbs Today's date: 03/15/2021 Total lbs lost to date: 2 Total lbs lost since last in-office visit: 0  Interim History: Olivia Paul is disappointed with her weight gain. She has been adding peanut butter toast to her microwave meals at lunch, and eating a lot of candy. She has been drinking regular Dr. Malachi Bonds.  Subjective:   1. Vitamin D deficiency Olivia Paul's last Vit D level was 47.4. She is on 5,000 units daily.  Assessment/Plan:   1. Vitamin D deficiency Low Vitamin D level contributes to fatigue and are associated with obesity, breast, and colon cancer. Olivia Paul will continue Vitamin D 5,000 IU daily, and she will follow-up for routine testing of Vitamin D, at least 2-3 times per year to avoid over-replacement.  2. Obesity with current BMI of 38.28 Olivia Paul is currently in the action stage of change. As such, her goal is to continue with weight loss efforts. She has agreed to the [employer].   Exercise goals: No exercise has been prescribed at this time.  Behavioral modification strategies: decreasing simple carbohydrates, no skipping meals, and meal planning and cooking strategies.  Promyce has agreed to follow-up with our clinic in 2 weeks. She was informed of the importance of frequent follow-up visits to maximize her success with intensive lifestyle modifications for her multiple health conditions.   Objective:   Blood pressure 129/76, pulse 91, temperature 98.4 F (36.9 C), height 5' (1.524 m), weight 196 lb (88.9 kg), SpO2 95 %. Body mass index is  38.28 kg/m.  General: Cooperative, alert, well developed, in no acute distress. HEENT: Conjunctivae and lids unremarkable. Cardiovascular: Regular rhythm.  Lungs: Normal work of breathing. Neurologic: No focal deficits.   Lab Results  Component Value Date   CREATININE 0.65 12/10/2020   BUN 8 12/10/2020   NA 142 12/10/2020   K 4.6 12/10/2020   CL 99 12/10/2020   CO2 26 12/10/2020   Lab Results  Component Value Date   ALT 14 02/24/2021   AST 14 12/10/2020   ALKPHOS 129 (H) 12/10/2020   BILITOT 0.5 12/10/2020   Lab Results  Component Value Date   HGBA1C 6.1 (H) 12/10/2020   HGBA1C 6.0 (H) 04/09/2020   HGBA1C 5.9 (H) 04/03/2019   HGBA1C 5.5 11/08/2018   HGBA1C 5.8 (H) 03/26/2018   Lab Results  Component Value Date   INSULIN 12.1 12/10/2020   Lab Results  Component Value Date   TSH 1.000 10/06/2020   Lab Results  Component Value Date   CHOL 156 02/24/2021   HDL 54 02/24/2021   LDLCALC 91 02/24/2021   TRIG 51 02/24/2021   CHOLHDL 2.9 02/24/2021   Lab Results  Component Value Date   VD25OH 47.4 12/10/2020   VD25OH 58.7 04/09/2020   VD25OH 57.5 04/03/2019   Lab Results  Component Value Date   WBC 10.2 10/06/2020   HGB 13.0 10/06/2020   HCT 41.0 10/06/2020   MCV 86 10/06/2020   PLT 430 10/06/2020   No results found for: IRON, TIBC, FERRITIN  Attestation Statements:   Reviewed by clinician on day of visit:  allergies, medications, problem list, medical history, surgical history, family history, social history, and previous encounter notes.  Time spent on visit including pre-visit chart review and post-visit care and charting was 32 minutes.    Wilhemena Durie, am acting as transcriptionist for [employer], PA-C.  I have reviewed the above documentation for accuracy and completeness, and I agree with the above. Abby Potash, PA-C

## 2021-03-30 ENCOUNTER — Ambulatory Visit (INDEPENDENT_AMBULATORY_CARE_PROVIDER_SITE_OTHER): Payer: 59 | Admitting: Physician Assistant

## 2021-03-30 ENCOUNTER — Encounter (INDEPENDENT_AMBULATORY_CARE_PROVIDER_SITE_OTHER): Payer: Self-pay | Admitting: Physician Assistant

## 2021-03-30 ENCOUNTER — Other Ambulatory Visit: Payer: Self-pay

## 2021-03-30 VITALS — BP 128/80 | HR 68 | Temp 97.9°F | Ht 60.0 in | Wt 193.0 lb

## 2021-03-30 DIAGNOSIS — Z6837 Body mass index (BMI) 37.0-37.9, adult: Secondary | ICD-10-CM | POA: Diagnosis not present

## 2021-03-30 DIAGNOSIS — R7303 Prediabetes: Secondary | ICD-10-CM | POA: Diagnosis not present

## 2021-03-30 DIAGNOSIS — E669 Obesity, unspecified: Secondary | ICD-10-CM | POA: Diagnosis not present

## 2021-03-30 NOTE — Progress Notes (Signed)
Chief Complaint:   OBESITY Olivia Paul is here to discuss her progress with her obesity treatment plan along with follow-up of her obesity related diagnoses. Olivia Paul is on the [employer] and states she is following her eating plan approximately 90% of the time. Olivia Paul states she is doing 0 minutes 0 times per week.  Today's visit was #: 7 Starting weight: 198 lbs Starting date: 12/10/2020 Today's weight: 193 lbs Today's date: 03/30/2021 Total lbs lost to date: 5 Total lbs lost since last in-office visit: 3  Interim History: Olivia Paul is eating less candy and she stopped her peanut butter toast. She is drinking diet soda instead of real soda. For work she is choosing higher protein, lower calorie microwave meals. She wants to incorporate more fish in her plan. Her hunger is controlled. She states she will have labs drawn next month with her primary care physician.   Subjective:   1. Pre-diabetes Olivia Paul's last A1c was 6.1. She is not on medications and her hunger is controlled.  Assessment/Plan:   1. Pre-diabetes Olivia Paul's A1c is to be rechecked with her primary care physician next month. She will continue her meal plan, weight loss, exercise, and decreasing simple carbohydrates to help decrease the risk of diabetes.   2. Obesity with current BMI of 37.69 Olivia Paul is currently in the action stage of change. As such, her goal is to continue with weight loss efforts. She has agreed to the [employer].   Exercise goals: No exercise has been prescribed at this time.  Behavioral modification strategies: increasing vegetables and meal planning and cooking strategies.  Olivia Paul has agreed to follow-up with our clinic in 2 weeks. She was informed of the importance of frequent follow-up visits to maximize her success with intensive lifestyle modifications for her multiple health conditions.   Objective:   Blood pressure 128/80, pulse 68, temperature 97.9 F (36.6 C),  height 5' (1.524 m), weight 193 lb (87.5 kg), SpO2 98 %. Body mass index is 37.69 kg/m.  General: Cooperative, alert, well developed, in no acute distress. HEENT: Conjunctivae and lids unremarkable. Cardiovascular: Regular rhythm.  Lungs: Normal work of breathing. Neurologic: No focal deficits.   Lab Results  Component Value Date   CREATININE 0.65 12/10/2020   BUN 8 12/10/2020   NA 142 12/10/2020   K 4.6 12/10/2020   CL 99 12/10/2020   CO2 26 12/10/2020   Lab Results  Component Value Date   ALT 14 02/24/2021   AST 14 12/10/2020   ALKPHOS 129 (H) 12/10/2020   BILITOT 0.5 12/10/2020   Lab Results  Component Value Date   HGBA1C 6.1 (H) 12/10/2020   HGBA1C 6.0 (H) 04/09/2020   HGBA1C 5.9 (H) 04/03/2019   HGBA1C 5.5 11/08/2018   HGBA1C 5.8 (H) 03/26/2018   Lab Results  Component Value Date   INSULIN 12.1 12/10/2020   Lab Results  Component Value Date   TSH 1.000 10/06/2020   Lab Results  Component Value Date   CHOL 156 02/24/2021   HDL 54 02/24/2021   LDLCALC 91 02/24/2021   TRIG 51 02/24/2021   CHOLHDL 2.9 02/24/2021   Lab Results  Component Value Date   VD25OH 47.4 12/10/2020   VD25OH 58.7 04/09/2020   VD25OH 57.5 04/03/2019   Lab Results  Component Value Date   WBC 10.2 10/06/2020   HGB 13.0 10/06/2020   HCT 41.0 10/06/2020   MCV 86 10/06/2020   PLT 430 10/06/2020   No results found for: IRON, TIBC, FERRITIN  Attestation Statements:   Reviewed by clinician on day of visit: allergies, medications, problem list, medical history, surgical history, family history, social history, and previous encounter notes.  Time spent on visit including pre-visit chart review and post-visit care and charting was 30 minutes.    Wilhemena Durie, am acting as transcriptionist for [employer], PA-C.  I have reviewed the above documentation for accuracy and completeness, and I agree with the above. Abby Potash, PA-C

## 2021-03-31 DIAGNOSIS — G44219 Episodic tension-type headache, not intractable: Secondary | ICD-10-CM | POA: Insufficient documentation

## 2021-04-19 ENCOUNTER — Ambulatory Visit (INDEPENDENT_AMBULATORY_CARE_PROVIDER_SITE_OTHER): Payer: 59 | Admitting: Internal Medicine

## 2021-04-19 ENCOUNTER — Encounter: Payer: Self-pay | Admitting: Internal Medicine

## 2021-04-19 ENCOUNTER — Other Ambulatory Visit: Payer: Self-pay

## 2021-04-19 VITALS — BP 114/72 | HR 93 | Temp 98.3°F | Ht 60.0 in | Wt 200.4 lb

## 2021-04-19 DIAGNOSIS — K219 Gastro-esophageal reflux disease without esophagitis: Secondary | ICD-10-CM | POA: Diagnosis not present

## 2021-04-19 DIAGNOSIS — Z0000 Encounter for general adult medical examination without abnormal findings: Secondary | ICD-10-CM

## 2021-04-19 DIAGNOSIS — E6609 Other obesity due to excess calories: Secondary | ICD-10-CM | POA: Diagnosis not present

## 2021-04-19 DIAGNOSIS — E559 Vitamin D deficiency, unspecified: Secondary | ICD-10-CM | POA: Diagnosis not present

## 2021-04-19 DIAGNOSIS — Z6839 Body mass index (BMI) 39.0-39.9, adult: Secondary | ICD-10-CM | POA: Diagnosis not present

## 2021-04-19 LAB — POCT URINALYSIS DIPSTICK
Bilirubin, UA: NEGATIVE
Blood, UA: NEGATIVE
Glucose, UA: NEGATIVE
Ketones, UA: NEGATIVE
Leukocytes, UA: NEGATIVE
Nitrite, UA: NEGATIVE
Protein, UA: NEGATIVE
Spec Grav, UA: 1.02 (ref 1.010–1.025)
Urobilinogen, UA: 0.2 E.U./dL
pH, UA: 6 (ref 5.0–8.0)

## 2021-04-19 MED ORDER — PANTOPRAZOLE SODIUM 40 MG PO TBEC: 40.0000 mg | DELAYED_RELEASE_TABLET | Freq: Every day | ORAL | 1 refills | Status: DC

## 2021-04-19 NOTE — Patient Instructions (Addendum)
Dandelion root tea - organic ? ?Health Maintenance, Female ?Adopting a healthy lifestyle and getting preventive care are important in promoting health and wellness. Ask your health care provider about: ?The right schedule for you to have regular tests and exams. ?Things you can do on your own to prevent diseases and keep yourself healthy. ?What should I know about diet, weight, and exercise? ?Eat a healthy diet ? ?Eat a diet that includes plenty of vegetables, fruits, low-fat dairy products, and lean protein. ?Do not eat a lot of foods that are high in solid fats, added sugars, or sodium. ?Maintain a healthy weight ?Body mass index (BMI) is used to identify weight problems. It estimates body fat based on height and weight. Your health care provider can help determine your BMI and help you achieve or maintain a healthy weight. ?Get regular exercise ?Get regular exercise. This is one of the most important things you can do for your health. Most adults should: ?Exercise for at least 150 minutes each week. The exercise should increase your heart rate and make you sweat (moderate-intensity exercise). ?Do strengthening exercises at least twice a week. This is in addition to the moderate-intensity exercise. ?Spend less time sitting. Even light physical activity can be beneficial. ?Watch cholesterol and blood lipids ?Have your blood tested for lipids and cholesterol at 65 years of age, then have this test every 5 years. ?Have your cholesterol levels checked more often if: ?Your lipid or cholesterol levels are high. ?You are older than 65 years of age. ?You are at high risk for heart disease. ?What should I know about cancer screening? ?Depending on your health history and family history, you may need to have cancer screening at various ages. This may include screening for: ?Breast cancer. ?Cervical cancer. ?Colorectal cancer. ?Skin cancer. ?Lung cancer. ?What should I know about heart disease, diabetes, and high blood  pressure? ?Blood pressure and heart disease ?High blood pressure causes heart disease and increases the risk of stroke. This is more likely to develop in people who have high blood pressure readings or are overweight. ?Have your blood pressure checked: ?Every 3-5 years if you are 19-78 years of age. ?Every year if you are 61 years old or older. ?Diabetes ?Have regular diabetes screenings. This checks your fasting blood sugar level. Have the screening done: ?Once every three years after age 13 if you are at a normal weight and have a low risk for diabetes. ?More often and at a younger age if you are overweight or have a high risk for diabetes. ?What should I know about preventing infection? ?Hepatitis B ?If you have a higher risk for hepatitis B, you should be screened for this virus. Talk with your health care provider to find out if you are at risk for hepatitis B infection. ?Hepatitis C ?Testing is recommended for: ?Everyone born from [birth year] through 1964. ?Anyone with known risk factors for hepatitis C. ?Sexually transmitted infections (STIs) ?Get screened for STIs, including gonorrhea and chlamydia, if: ?You are sexually active and are younger than 65 years of age. ?You are older than 65 years of age and your health care provider tells you that you are at risk for this type of infection. ?Your sexual activity has changed since you were last screened, and you are at increased risk for chlamydia or gonorrhea. Ask your health care provider if you are at risk. ?Ask your health care provider about whether you are at high risk for HIV. Your health care provider may recommend a  prescription medicine to help prevent HIV infection. If you choose to take medicine to prevent HIV, you should first get tested for HIV. You should then be tested every 3 months for as long as you are taking the medicine. ?Pregnancy ?If you are about to stop having your period (premenopausal) and you may become pregnant, seek counseling before you  get pregnant. ?Take 400 to 800 micrograms (mcg) of folic acid every day if you become pregnant. ?Ask for birth control (contraception) if you want to prevent pregnancy. ?Osteoporosis and menopause ?Osteoporosis is a disease in which the bones lose minerals and strength with aging. This can result in bone fractures. If you are 68 years old or older, or if you are at risk for osteoporosis and fractures, ask your health care provider if you should: ?Be screened for bone loss. ?Take a calcium or vitamin D supplement to lower your risk of fractures. ?Be given hormone replacement therapy (HRT) to treat symptoms of menopause. ?Follow these instructions at home: ?Alcohol use ?Do not drink alcohol if: ?Your health care provider tells you not to drink. ?You are pregnant, may be pregnant, or are planning to become pregnant. ?If you drink alcohol: ?Limit how much you have to: ?0-1 drink a day. ?Know how much alcohol is in your drink. In the [place] one drink equals one 12 oz bottle of beer (355 mL), one 5 oz glass of wine (148 mL), or one 1? oz glass of hard liquor (44 mL). ?Lifestyle ?Do not use any products that contain nicotine or tobacco. These products include cigarettes, chewing tobacco, and vaping devices, such as e-cigarettes. If you need help quitting, ask your health care provider. ?Do not use street drugs. ?Do not share needles. ?Ask your health care provider for help if you need support or information about quitting drugs. ?General instructions ?Schedule regular health, dental, and eye exams. ?Stay current with your vaccines. ?Tell your health care provider if: ?You often feel depressed. ?You have ever been abused or do not feel safe at home. ?Summary ?Adopting a healthy lifestyle and getting preventive care are important in promoting health and wellness. ?Follow your health care provider's instructions about healthy diet, exercising, and getting tested or screened for diseases. ?Follow your health care provider's  instructions on monitoring your cholesterol and blood pressure. ?This information is not intended to replace advice given to you by your health care provider. Make sure you discuss any questions you have with your health care provider. ?Document Revised: 06/22/2020 Document Reviewed: 06/22/2020 ?Elsevier Patient Education ? [place]. ? ?

## 2021-04-19 NOTE — Progress Notes (Signed)
I,Katawbba Wiggins,acting as a Education administrator for Maximino Greenland, MD.,have documented all relevant documentation on the behalf of Maximino Greenland, MD,as directed by  Maximino Greenland, MD while in the presence of Maximino Greenland, MD.  This visit occurred during the SARS-CoV-2 public health emergency.  Safety protocols were in place, including screening questions prior to the visit, additional usage of staff PPE, and extensive cleaning of exam room while observing appropriate contact time as indicated for disinfecting solutions.  Subjective:     Patient ID: Olivia Paul , female    DOB: 01-Mar-1955 , 66 y.o.   MRN: [id number]   Chief Complaint  Patient presents with   Annual Exam    HPI  She is here today for a full physical examination. She is followed by Dr. Drema Dallas for her GYN care, last pap was 2022. She denies having any headaches, chest pain and shortness of breath.     Past Medical History:  Diagnosis Date   Arthritis    lt knee   Asthma 2011   Chest pain    Complication of anesthesia    Fatty liver    Headache    sinus   PONV (postoperative nausea and vomiting)    Pre-diabetes    Recurrent upper respiratory infection (URI)    Vertigo    Vitamin D deficiency      Family History  Problem Relation Age of Onset   Kidney disease Mother    Hypertension Mother    Allergic rhinitis Mother    Heart disease Father    Allergic rhinitis Father    Asthma Father    Urticaria Sister    Breast cancer Paternal Grandmother    Breast cancer Maternal Aunt    Breast cancer Paternal Aunt    Angioedema Neg Hx    Atopy Neg Hx    Eczema Neg Hx    Immunodeficiency Neg Hx      Current Outpatient Medications:    pantoprazole (PROTONIX) 40 MG tablet, Take 1 tablet (40 mg total) by mouth daily., Disp: 30 tablet, Rfl: 1   albuterol (PROVENTIL) (2.5 MG/3ML) 0.083% nebulizer solution, Use 1 vial every 4-6 hours as needed for cough, wheeze, shortness of breath or chest tightness, Disp:  150 mL, Rfl: 0   albuterol (VENTOLIN HFA) 108 (90 Base) MCG/ACT inhaler, Inhale 2 puffs into the lungs every 4 (four) hours as needed for wheezing or shortness of breath., Disp: 18 g, Rfl: 1   atorvastatin (LIPITOR) 20 MG tablet, Take 1 tablet (20 mg total) by mouth daily., Disp: 30 tablet, Rfl: 11   BREO ELLIPTA 200-25 MCG/ACT AEPB, Inhale 1 puff into the lungs daily., Disp: 1 each, Rfl: 5   Cholecalciferol (VITAMIN D) 50 MCG (2000 UT) CAPS, Take 5,000 Units by mouth daily. , Disp: , Rfl:    clonazePAM (KLONOPIN) 0.5 MG tablet, TAKE 1 TABLET BY MOUTH TWICE A DAY AS NEEDED, Disp: 60 tablet, Rfl: 0   DULoxetine (CYMBALTA) 60 MG capsule, TAKE 1 CAPSULE BY MOUTH EVERY DAY, Disp: 90 capsule, Rfl: 2   EPINEPHrine 0.3 mg/0.3 mL IJ SOAJ injection, Inject 0.3 mLs (0.3 mg total) into the muscle as needed for anaphylaxis., Disp: 1 each, Rfl: 1   fexofenadine (ALLEGRA) 180 MG tablet, TAKE ONE TABLET ONCE DAILY FOR RUNNY NOSE OR ITCHING, Disp: 90 tablet, Rfl: 1   gabapentin (NEURONTIN) 300 MG capsule, daily. , Disp: , Rfl:    ipratropium (ATROVENT) 0.06 % nasal spray, Place 2 sprays into  both nostrils 4 (four) times daily as needed (Nasal congestion or drainage)., Disp: 15 mL, Rfl: 5   Krill Oil (OMEGA-3) 500 MG CAPS, Take 500 mg by mouth daily., Disp: , Rfl:    magnesium oxide (MAG-OX) 400 MG tablet, Take 400 mg by mouth daily., Disp: , Rfl:    meclizine (ANTIVERT) 50 MG tablet, Take 1/2 tab po qpm prn, Disp: 30 tablet, Rfl: 0   Menthol-Methyl Salicylate (TIGER BALM LINIMENT EX), Apply 1 application topically at bedtime as needed (pain)., Disp: , Rfl:    montelukast (SINGULAIR) 10 MG tablet, Take 1 tablet (10 mg total) by mouth at bedtime., Disp: 30 tablet, Rfl: 5   oxybutynin (DITROPAN-XL) 10 MG 24 hr tablet, Take 10 mg by mouth daily., Disp: , Rfl:    tiZANidine (ZANAFLEX) 4 MG tablet, Take 4 mg by mouth every 8 (eight) hours as needed., Disp: , Rfl:    traMADol (ULTRAM) 50 MG tablet, Take 50 mg by mouth  every 8 (eight) hours as needed for moderate pain. , Disp: , Rfl:    TURMERIC PO, Take by mouth., Disp: , Rfl:    vitamin E 400 UNIT capsule, Take 400 Units by mouth daily., Disp: , Rfl:    Allergies  Allergen Reactions   Meloxicam Other (See Comments)    Bruised her legs all over     Nsaids Other (See Comments)    Scarring and bruising of legs   Sulfa Antibiotics Other (See Comments)    Mouth broke out in sores   Oxycodone    Erythromycin Nausea And Vomiting      The patient states she uses post menopausal status for birth control. Last LMP was No LMP recorded. Patient is postmenopausal.. Negative for Dysmenorrhea. Negative for: breast discharge, breast lump(s), breast pain and breast self exam. Associated symptoms include abnormal vaginal bleeding. Pertinent negatives include abnormal bleeding (hematology), anxiety, decreased libido, depression, difficulty falling sleep, dyspareunia, history of infertility, nocturia, sexual dysfunction, sleep disturbances, urinary incontinence, urinary urgency, vaginal discharge and vaginal itching. Diet regular.The patient states her exercise level is  intermittent.  . The patient's tobacco use is:  Social History   Tobacco Use  Smoking Status Never  Smokeless Tobacco Never  . She has been exposed to passive smoke. The patient's alcohol use is:  Social History   Substance and Sexual Activity  Alcohol Use No   Alcohol/week: 0.0 standard drinks   Review of Systems  Constitutional: Negative.   HENT: Negative.    Eyes: Negative.   Respiratory: Negative.    Cardiovascular: Negative.   Gastrointestinal:  Positive for abdominal pain and diarrhea.       States she has been having diarrhea and indigestion since starting this new eating plan. She is now followed at [facility]. States she had something with tomatoes yesterday which has upset her stomach. Additionally, her meal plan includes yogurt and cottage cheese, she hasn't tolerated b/c she is  lactose intolerant.  Endocrine: Negative.   Genitourinary: Negative.   Musculoskeletal: Negative.   Skin: Negative.   Allergic/Immunologic: Negative.   Neurological: Negative.   Hematological: Negative.   Psychiatric/Behavioral: Negative.      Today's Vitals   04/19/21 0830  BP: 114/72  Pulse: 93  Temp: 98.3 F (36.8 C)  Weight: 200 lb 6.4 oz (90.9 kg)  Height: 5' (1.524 m)  PainSc: 8   PainLoc: Abdomen   Body mass index is 39.14 kg/m.  Wt Readings from Last 3 Encounters:  04/19/21 200 lb 6.4  oz (90.9 kg)  03/30/21 193 lb (87.5 kg)  03/15/21 196 lb (88.9 kg)    BP Readings from Last 3 Encounters:  04/19/21 114/72  03/30/21 128/80  03/15/21 129/76    Objective:  Physical Exam Vitals and nursing note reviewed.  Constitutional:      Appearance: Normal appearance. She is obese.  HENT:     Head: Normocephalic and atraumatic.     Right Ear: Tympanic membrane, ear canal and external ear normal.     Left Ear: Tympanic membrane, ear canal and external ear normal.     Nose:     Comments: Masked     Mouth/Throat:     Comments: Masked  Eyes:     Extraocular Movements: Extraocular movements intact.     Conjunctiva/sclera: Conjunctivae normal.     Pupils: Pupils are equal, round, and reactive to light.  Cardiovascular:     Rate and Rhythm: Normal rate and regular rhythm.     Pulses: Normal pulses.     Heart sounds: Normal heart sounds.  Pulmonary:     Effort: Pulmonary effort is normal.     Breath sounds: Normal breath sounds.  Chest:  Breasts:    Tanner Score is 5.     Right: Normal.     Left: Normal.  Abdominal:     General: Bowel sounds are normal.     Palpations: Abdomen is soft.     Comments: Obese, soft  Genitourinary:    Comments: deferred Musculoskeletal:        General: Normal range of motion.     Cervical back: Normal range of motion and neck supple.     Comments: Healed surgical scar on left knee  Skin:    General: Skin is warm and dry.   Neurological:     General: No focal deficit present.     Mental Status: She is alert and oriented to person, place, and time.  Psychiatric:        Mood and Affect: Mood normal.        Behavior: Behavior normal.     Assessment And Plan:     1. Routine general medical examination at health care facility Comments: A full exam was performed. Importance of monthly self breast exams was discussed with the patient. PATIENT IS ADVISED TO GET 30-45 MINUTES REGULAR EXERCISE NO LESS THAN FOUR TO FIVE DAYS PER WEEK - BOTH WEIGHTBEARING EXERCISES AND AEROBIC ARE RECOMMENDED.  PATIENT IS ADVISED TO FOLLOW A HEALTHY DIET WITH AT LEAST SIX FRUITS/VEGGIES PER DAY, DECREASE INTAKE OF RED MEAT, AND TO INCREASE FISH INTAKE TO TWO DAYS PER WEEK.  MEATS/FISH SHOULD NOT BE FRIED, BAKED OR BROILED IS PREFERABLE.  IT IS ALSO IMPORTANT TO CUT BACK ON YOUR SUGAR INTAKE. PLEASE AVOID ANYTHING WITH ADDED SUGAR, CORN SYRUP OR OTHER SWEETENERS. IF YOU MUST USE A SWEETENER, YOU CAN TRY STEVIA. IT IS ALSO IMPORTANT TO AVOID ARTIFICIALLY SWEETENERS AND DIET BEVERAGES. LASTLY, I SUGGEST WEARING SPF 50 SUNSCREEN ON EXPOSED PARTS AND ESPECIALLY WHEN IN THE DIRECT SUNLIGHT FOR AN EXTENDED PERIOD OF TIME.  PLEASE AVOID FAST FOOD RESTAURANTS AND INCREASE YOUR WATER INTAKE.  - Hemoglobin A1c - CBC - CMP14+EGFR - Insulin, random - POCT Urinalysis Dipstick (81002)  2. Gastroesophageal reflux disease without esophagitis Comments: She is reminded to stop eating 3 hours prior to going to bed. I will send pantoprazole 90m once daily to the pharmacy.   3. Vitamin D deficiency disease Comments: I will check vitamin D level and  supplement as needed.  - VITAMIN D 25 Hydroxy (Vit-D Deficiency, Fractures)  4. Class 2 obesity due to excess calories without serious comorbidity with body mass index (BMI) of 39.0 to 39.9 in adult Comments: She is encouraged to strive for BMI less than 30 to decrease cardiac risk. Advised to aim for at least  150 minutes of exercise per week.   Patient was given opportunity to ask questions. Patient verbalized understanding of the plan and was able to repeat key elements of the plan. All questions were answered to their satisfaction.   I, Maximino Greenland, MD, have reviewed all documentation for this visit. The documentation on 04/19/21 for the exam, diagnosis, procedures, and orders are all accurate and complete.   THE PATIENT IS ENCOURAGED TO PRACTICE SOCIAL DISTANCING DUE TO THE COVID-19 PANDEMIC.

## 2021-04-20 ENCOUNTER — Encounter: Payer: Self-pay | Admitting: Internal Medicine

## 2021-04-20 LAB — CMP14+EGFR
ALT: 13 IU/L (ref 0–32)
AST: 12 IU/L (ref 0–40)
Albumin/Globulin Ratio: 1.8 (ref 1.2–2.2)
Albumin: 4.2 g/dL (ref 3.8–4.8)
Alkaline Phosphatase: 129 IU/L — ABNORMAL HIGH (ref 44–121)
BUN/Creatinine Ratio: 11 — ABNORMAL LOW (ref 12–28)
BUN: 8 mg/dL (ref 8–27)
Bilirubin Total: 0.5 mg/dL (ref 0.0–1.2)
CO2: 23 mmol/L (ref 20–29)
Calcium: 9.3 mg/dL (ref 8.7–10.3)
Chloride: 102 mmol/L (ref 96–106)
Creatinine, Ser: 0.72 mg/dL (ref 0.57–1.00)
Globulin, Total: 2.4 g/dL (ref 1.5–4.5)
Glucose: 97 mg/dL (ref 70–99)
Potassium: 4.4 mmol/L (ref 3.5–5.2)
Sodium: 140 mmol/L (ref 134–144)
Total Protein: 6.6 g/dL (ref 6.0–8.5)
eGFR: 93 mL/min/{1.73_m2} (ref >59)

## 2021-04-20 LAB — CBC
Hematocrit: 40.8 % (ref 34.0–46.6)
Hemoglobin: 13.6 g/dL (ref 11.1–15.9)
MCH: 28.2 pg (ref 26.6–33.0)
MCHC: 33.3 g/dL (ref 31.5–35.7)
MCV: 85 fL (ref 79–97)
Platelets: 430 10*3/uL (ref 150–450)
RBC: 4.83 x10E6/uL (ref 3.77–5.28)
RDW: 14.6 % (ref 11.7–15.4)
WBC: 13.6 10*3/uL — ABNORMAL HIGH (ref 3.4–10.8)

## 2021-04-20 LAB — VITAMIN D 25 HYDROXY (VIT D DEFICIENCY, FRACTURES): Vit D, 25-Hydroxy: 46.5 ng/mL (ref 30.0–100.0)

## 2021-04-20 LAB — INSULIN, RANDOM: INSULIN: 25.3 u[IU]/mL — ABNORMAL HIGH (ref 2.6–24.9)

## 2021-04-20 LAB — HEMOGLOBIN A1C
Est. average glucose Bld gHb Est-mCnc: 126 mg/dL
Hgb A1c MFr Bld: 6 % — ABNORMAL HIGH (ref 4.8–5.6)

## 2021-04-27 ENCOUNTER — Encounter (INDEPENDENT_AMBULATORY_CARE_PROVIDER_SITE_OTHER): Payer: Self-pay

## 2021-04-29 ENCOUNTER — Ambulatory Visit (INDEPENDENT_AMBULATORY_CARE_PROVIDER_SITE_OTHER): Payer: 59 | Admitting: Physician Assistant

## 2021-05-29 ENCOUNTER — Other Ambulatory Visit: Payer: Self-pay | Admitting: Internal Medicine

## 2021-06-15 ENCOUNTER — Encounter: Payer: Self-pay | Admitting: Internal Medicine

## 2021-06-15 ENCOUNTER — Other Ambulatory Visit: Payer: Self-pay | Admitting: Internal Medicine

## 2021-06-16 MED ORDER — MECLIZINE HCL 50 MG PO TABS: ORAL_TABLET | ORAL | 0 refills | Status: DC

## 2021-07-02 ENCOUNTER — Encounter: Payer: Self-pay | Admitting: Adult Health

## 2021-07-02 ENCOUNTER — Ambulatory Visit (INDEPENDENT_AMBULATORY_CARE_PROVIDER_SITE_OTHER): Payer: 59 | Admitting: Adult Health

## 2021-07-02 VITALS — BP 110/70 | HR 96 | Temp 98.3°F | Ht 60.0 in | Wt 204.8 lb

## 2021-07-02 DIAGNOSIS — R400 Somnolence: Secondary | ICD-10-CM | POA: Insufficient documentation

## 2021-07-02 DIAGNOSIS — G4700 Insomnia, unspecified: Secondary | ICD-10-CM | POA: Diagnosis not present

## 2021-07-02 NOTE — Assessment & Plan Note (Signed)
Daytime sleepiness, fatigue, restless sleep, insomnia, BMI 40 all suspicious for underlying sleep apnea Patient education was given - discussed how weight can impact sleep and risk for sleep disordered breathing - discussed options to assist with weight loss: combination of diet modification, cardiovascular and strength training exercises   - had an extensive discussion regarding the adverse health consequences related to untreated sleep disordered breathing - specifically discussed the risks for hypertension, coronary artery disease, cardiac dysrhythmias, cerebrovascular disease, and diabetes - lifestyle modification discussed   - discussed how sleep disruption can increase risk of accidents, particularly when driving - safe driving practices were discussed   Plan  Patient Instructions  Set up for home sleep study  Healthy sleep regimen  Do not drive if sleepy  Activity as tolerated.  Follow up in 6 weeks to discuss results and .As needed

## 2021-07-02 NOTE — Patient Instructions (Signed)
Set up for home sleep study  Healthy sleep regimen  Do not drive if sleepy  Activity as tolerated.  Follow up in 6 weeks to discuss results and .As needed

## 2021-07-02 NOTE — Assessment & Plan Note (Signed)
Chronic insomnia-patient has multiple factors that may be impacting her sleep.  We will set up for home sleep study to rule out underlying sleep apnea.  Patient has chronic pain and overactive bladder that may be contributing to her restless sleep.  Patient education on healthy sleep regimen given. We will discuss in more detail at follow-up visit after her home sleep study  Plan  Patient Instructions  Set up for home sleep study  Healthy sleep regimen  Do not drive if sleepy  Activity as tolerated.  Follow up in 6 weeks to discuss results and .As needed

## 2021-07-02 NOTE — Progress Notes (Signed)
@Patient  ID: Olivia Paul, female    DOB: [birth year], 66 y.o.   MRN: [id number]  Chief Complaint  Patient presents with   Consult    Referring provider: Orie Rout, MD  HPI: 66 year old female seen for sleep consult Jul 02, 2021 for daytime fatigue, restless sleep and insomnia.  TEST/EVENTS :   07/02/2021 Sleep consult  Patient presents for a sleep consult today.  Kindly referred by her primary care provider Dr. Baird Cancer. Patient complains that she has had chronic insomnia and sleep issues for years.  Patient says that loss at times it takes her a while to go to sleep she always has to take something to help her sleep.  Even despite taking medication she wakes up numerous times throughout the night.  Feels tired throughout the daytime.  Never feels rested and has chronic fatigue.  Patient is unsure if she snores.  Patient says she just feels that she never sleeps very well.  Never feels rested.  Wakes up feeling unrefreshed.  She says she does have some sleepiness during the daytime but not significant.  Does not nap.  Does not drink any caffeine.  Patient has taken Ambien in the past.  But has not used for several years.  Currently taking Zanaflex and Neurontin to help with chronic pain and sleep issues.  Patient has also overactive bladder.  This is another issue that she gets up multiple times throughout the night.  Patient says she struggles with chronic pain with neck back knee and leg pain. Typically goes to bed between 9 to 11 PM.  Takes about half an hour to go to sleep.  She cannot go to sleep if she does not take something to help her with her pain.  Typically gets up 3-4 times a night.  And gets up at 6 AM.  Does not operate heavy machinery.  No significant weight gain.  Weight is at 204 pounds BMI is at 40. No symptoms suspicious for cataplexy or sleep paralysis.  Epworth score is 0.  Patient says she does feel sleepy but does not doze off with inactivity.  Medical history  significant for asthma, hyperlipidemia, chronic allergies, chronic pain in neck back knee and legs.  Has arthritis.  Has anxiety, GERD, chronic headaches, overactive bladder  Surgical history hysterectomy, appendectomy, trigger finger surgery, knee surgery  Social history patient is single.  Has 2 adult children.  Is a Education officer, museum.  Lives with her cousin.  No smoking, alcohol or drug use.  Family history positive for diabetes, chronic kidney disease, hypertension, coronary disease, cancer    Allergies  Allergen Reactions   Meloxicam Other (See Comments)    Bruised her legs all over     Nsaids Other (See Comments)    Scarring and bruising of legs   Sulfa Antibiotics Other (See Comments)    Mouth broke out in sores   Erythromycin Nausea And Vomiting    Immunization History  Administered Date(s) Administered   H1N1 11/16/2020   Influenza,inj,Quad PF,6+ Mos 12/04/2019, 10/22/2020   Influenza-Unspecified 01/07/2019   PFIZER(Purple Top)SARS-COV-2 Vaccination 03/09/2019, 03/30/2019, 12/01/2019, 11/18/2020   Pfizer Covid-19 Vaccine Bivalent Booster 42yrs & up 11/18/2020   Tdap 11/12/2018   Zoster Recombinat (Shingrix) 05/24/2016, 01/07/2019   Zoster, Live 05/12/2019    Past Medical History:  Diagnosis Date   Arthritis    lt knee   Asthma 2011   Chest pain    Complication of anesthesia    Fatty liver    Headache  sinus   PONV (postoperative nausea and vomiting)    Pre-diabetes    Recurrent upper respiratory infection (URI)    Vertigo    Vitamin D deficiency     Tobacco History: Social History   Tobacco Use  Smoking Status Never   Passive exposure: Past  Smokeless Tobacco Never   Counseling given: Not Answered   Outpatient Medications Prior to Visit  Medication Sig Dispense Refill   albuterol (PROVENTIL) (2.5 MG/3ML) 0.083% nebulizer solution Use 1 vial every 4-6 hours as needed for cough, wheeze, shortness of breath or chest tightness 150 mL 0   albuterol  (VENTOLIN HFA) 108 (90 Base) MCG/ACT inhaler Inhale 2 puffs into the lungs every 4 (four) hours as needed for wheezing or shortness of breath. 18 g 1   Ascorbic Acid (VITAMIN C) 100 MG tablet Take 100 mg by mouth daily.     atorvastatin (LIPITOR) 20 MG tablet Take 1 tablet (20 mg total) by mouth daily. 30 tablet 11   BREO ELLIPTA 200-25 MCG/ACT AEPB Inhale 1 puff into the lungs daily. 1 each 5   Cholecalciferol (VITAMIN D) 50 MCG (2000 UT) CAPS Take 5,000 Units by mouth daily.      DULoxetine (CYMBALTA) 60 MG capsule TAKE 1 CAPSULE BY MOUTH EVERY DAY 90 capsule 2   fexofenadine (ALLEGRA) 180 MG tablet TAKE ONE TABLET ONCE DAILY FOR RUNNY NOSE OR ITCHING 90 tablet 1   gabapentin (NEURONTIN) 300 MG capsule daily.      Krill Oil (OMEGA-3) 500 MG CAPS Take 500 mg by mouth daily.     magnesium oxide (MAG-OX) 400 MG tablet Take 400 mg by mouth daily.     meclizine (ANTIVERT) 50 MG tablet Take 1/2 tab po qpm prn 30 tablet 0   Menthol-Methyl Salicylate (TIGER BALM LINIMENT EX) Apply 1 application topically at bedtime as needed (pain).     montelukast (SINGULAIR) 10 MG tablet Take 1 tablet (10 mg total) by mouth at bedtime. 30 tablet 5   oxybutynin (DITROPAN-XL) 10 MG 24 hr tablet Take 10 mg by mouth daily.     pantoprazole (PROTONIX) 40 MG tablet TAKE 1 TABLET BY MOUTH EVERY DAY 30 tablet 1   tiZANidine (ZANAFLEX) 4 MG tablet Take 4 mg by mouth every 8 (eight) hours as needed.     traMADol (ULTRAM) 50 MG tablet Take 50 mg by mouth every 8 (eight) hours as needed for moderate pain.      TURMERIC PO Take by mouth.     vitamin E 400 UNIT capsule Take 400 Units by mouth daily.     vitamin k 100 MCG tablet Take 100 mcg by mouth daily.     clonazePAM (KLONOPIN) 0.5 MG tablet TAKE 1 TABLET BY MOUTH TWICE A DAY AS NEEDED (Patient not taking: Reported on 07/02/2021) 60 tablet 0   EPINEPHrine 0.3 mg/0.3 mL IJ SOAJ injection Inject 0.3 mLs (0.3 mg total) into the muscle as needed for anaphylaxis. (Patient not  taking: Reported on 07/02/2021) 1 each 1   ipratropium (ATROVENT) 0.06 % nasal spray Place 2 sprays into both nostrils 4 (four) times daily as needed (Nasal congestion or drainage). (Patient not taking: Reported on 07/02/2021) 15 mL 5   No facility-administered medications prior to visit.     Review of Systems:   Constitutional:   No  weight loss, night sweats,  Fevers, chills,  +fatigue, or  lassitude.  HEENT:   No headaches,  Difficulty swallowing,  Tooth/dental problems, or  Sore throat,  No sneezing, itching, ear ache, nasal congestion, post nasal drip,   CV:  No chest pain,  Orthopnea, PND, swelling in lower extremities, anasarca, dizziness, palpitations, syncope.   GI  No heartburn, indigestion, abdominal pain, nausea, vomiting, diarrhea, change in bowel habits, loss of appetite, bloody stools.   Resp: No shortness of breath with exertion or at rest.  No excess mucus, no productive cough,  No non-productive cough,  No coughing up of blood.  No change in color of mucus.  No wheezing.  No chest wall deformity  Skin: no rash or lesions.  GU: no dysuria, change in color of urine, no urgency or frequency.  No flank pain, no hematuria   MS: Chronic joint pain   Physical Exam  BP 110/70 (BP Location: Left Arm, Patient Position: Sitting, Cuff Size: Large)   Pulse 96   Temp 98.3 F (36.8 C) (Oral)   Ht 5' (1.524 m)   Wt 204 lb 12.8 oz (92.9 kg)   SpO2 99%   BMI 40.00 kg/m   GEN: A/Ox3; pleasant , NAD, well nourished    HEENT:  [place]/AT,  , NOSE-clear, THROAT-clear, no lesions, no postnasal drip or exudate noted.  Class III-IV MP airway  NECK:  Supple w/ fair ROM; no JVD; normal carotid impulses w/o bruits; no thyromegaly or nodules palpated; no lymphadenopathy.    RESP  Clear  P & A; w/o, wheezes/ rales/ or rhonchi. no accessory muscle use, no dullness to percussion  CARD:  RRR, no m/r/g, no peripheral edema, pulses intact, no cyanosis or clubbing.  GI:   Soft  & nt; nml bowel sounds; no organomegaly or masses detected.   Musco: Warm bil, no deformities or joint swelling noted.   Neuro: alert, no focal deficits noted.    Skin: Warm, no lesions or rashes    Lab Results:   BMET   BNP No results found for: BNP  ProBNP No results found for: PROBNP  Imaging: No results found.        View : No data to display.          No results found for: NITRICOXIDE      Assessment & Plan:   Daytime sleepiness Daytime sleepiness, fatigue, restless sleep, insomnia, BMI 40 all suspicious for underlying sleep apnea Patient education was given - discussed how weight can impact sleep and risk for sleep disordered breathing - discussed options to assist with weight loss: combination of diet modification, cardiovascular and strength training exercises   - had an extensive discussion regarding the adverse health consequences related to untreated sleep disordered breathing - specifically discussed the risks for hypertension, coronary artery disease, cardiac dysrhythmias, cerebrovascular disease, and diabetes - lifestyle modification discussed   - discussed how sleep disruption can increase risk of accidents, particularly when driving - safe driving practices were discussed   Plan  Patient Instructions  Set up for home sleep study  Healthy sleep regimen  Do not drive if sleepy  Activity as tolerated.  Follow up in 6 weeks to discuss results and .As needed        Insomnia Chronic insomnia-patient has multiple factors that may be impacting her sleep.  We will set up for home sleep study to rule out underlying sleep apnea.  Patient has chronic pain and overactive bladder that may be contributing to her restless sleep.  Patient education on healthy sleep regimen given. We will discuss in more detail at follow-up visit after her home sleep study  Plan  Patient Instructions  Set up for home sleep study  Healthy sleep regimen  Do not  drive if sleepy  Activity as tolerated.  Follow up in 6 weeks to discuss results and .As needed         Rexene Edison, NP 07/02/2021

## 2021-08-24 ENCOUNTER — Encounter: Payer: Self-pay | Admitting: Internal Medicine

## 2021-08-24 ENCOUNTER — Ambulatory Visit (INDEPENDENT_AMBULATORY_CARE_PROVIDER_SITE_OTHER): Payer: 59 | Admitting: Internal Medicine

## 2021-08-24 VITALS — BP 112/80 | HR 74 | Temp 98.4°F | Ht 60.0 in | Wt 203.6 lb

## 2021-08-24 DIAGNOSIS — Z6839 Body mass index (BMI) 39.0-39.9, adult: Secondary | ICD-10-CM

## 2021-08-24 DIAGNOSIS — R7303 Prediabetes: Secondary | ICD-10-CM

## 2021-08-24 DIAGNOSIS — E66812 Other obesity due to excess calories: Secondary | ICD-10-CM

## 2021-08-24 DIAGNOSIS — E7800 Pure hypercholesterolemia, unspecified: Secondary | ICD-10-CM

## 2021-08-24 DIAGNOSIS — E6609 Other obesity due to excess calories: Secondary | ICD-10-CM

## 2021-08-24 DIAGNOSIS — Z23 Encounter for immunization: Secondary | ICD-10-CM

## 2021-08-24 MED ORDER — ATORVASTATIN CALCIUM 20 MG PO TABS: 20.0000 mg | ORAL_TABLET | Freq: Every day | ORAL | 1 refills | Status: DC

## 2021-08-24 NOTE — Patient Instructions (Signed)

## 2021-08-24 NOTE — Progress Notes (Signed)
Rich Brave [place],acting as a Education administrator for Maximino Greenland, MD.,have documented all relevant documentation on the behalf of Maximino Greenland, MD,as directed by  Maximino Greenland, MD while in the presence of Maximino Greenland, MD.    Subjective:     Patient ID: Olivia Paul , female    DOB: 12-03-1955 , 66 y.o.   MRN: [id number]   Chief Complaint  Patient presents with   Prediabetes   Hyperlipidemia    HPI  Patient presents today for a prediabetes and chol check. She states she is now walking regularly with a group at work. She reports compliance with atorvastatin for her cholesterol.     Past Medical History:  Diagnosis Date   Arthritis    lt knee   Asthma 2011   Chest pain    Complication of anesthesia    Fatty liver    Headache    sinus   PONV (postoperative nausea and vomiting)    Pre-diabetes    Recurrent upper respiratory infection (URI)    Vertigo    Vitamin D deficiency      Family History  Problem Relation Age of Onset   Kidney disease Mother    Hypertension Mother    Allergic rhinitis Mother    Heart disease Father    Allergic rhinitis Father    Asthma Father    Urticaria Sister    Breast cancer Paternal Grandmother    Breast cancer Maternal Aunt    Breast cancer Paternal Aunt    Angioedema Neg Hx    Atopy Neg Hx    Eczema Neg Hx    Immunodeficiency Neg Hx      Current Outpatient Medications:    albuterol (PROVENTIL) (2.5 MG/3ML) 0.083% nebulizer solution, Use 1 vial every 4-6 hours as needed for cough, wheeze, shortness of breath or chest tightness, Disp: 150 mL, Rfl: 0   albuterol (VENTOLIN HFA) 108 (90 Base) MCG/ACT inhaler, Inhale 2 puffs into the lungs every 4 (four) hours as needed for wheezing or shortness of breath., Disp: 18 g, Rfl: 1   Ascorbic Acid (VITAMIN C) 100 MG tablet, Take 100 mg by mouth daily., Disp: , Rfl:    BREO ELLIPTA 200-25 MCG/ACT AEPB, Inhale 1 puff into the lungs daily., Disp: 1 each, Rfl: 5   Cholecalciferol (VITAMIN  D) 50 MCG (2000 UT) CAPS, Take 5,000 Units by mouth daily. , Disp: , Rfl:    DULoxetine (CYMBALTA) 60 MG capsule, TAKE 1 CAPSULE BY MOUTH EVERY DAY, Disp: 90 capsule, Rfl: 2   EPINEPHrine 0.3 mg/0.3 mL IJ SOAJ injection, Inject 0.3 mLs (0.3 mg total) into the muscle as needed for anaphylaxis., Disp: 1 each, Rfl: 1   fexofenadine (ALLEGRA) 180 MG tablet, TAKE ONE TABLET ONCE DAILY FOR RUNNY NOSE OR ITCHING, Disp: 90 tablet, Rfl: 1   gabapentin (NEURONTIN) 300 MG capsule, daily. , Disp: , Rfl:    ipratropium (ATROVENT) 0.06 % nasal spray, Place 2 sprays into both nostrils 4 (four) times daily as needed (Nasal congestion or drainage)., Disp: 15 mL, Rfl: 5   Krill Oil (OMEGA-3) 500 MG CAPS, Take 500 mg by mouth daily., Disp: , Rfl:    magnesium oxide (MAG-OX) 400 MG tablet, Take 400 mg by mouth daily., Disp: , Rfl:    meclizine (ANTIVERT) 50 MG tablet, Take 1/2 tab po qpm prn, Disp: 30 tablet, Rfl: 0   Menthol-Methyl Salicylate (TIGER BALM LINIMENT EX), Apply 1 application topically at bedtime as needed (pain)., Disp: ,  Rfl:    montelukast (SINGULAIR) 10 MG tablet, Take 1 tablet (10 mg total) by mouth at bedtime., Disp: 30 tablet, Rfl: 5   oxybutynin (DITROPAN-XL) 10 MG 24 hr tablet, Take 10 mg by mouth daily., Disp: , Rfl:    pantoprazole (PROTONIX) 40 MG tablet, TAKE 1 TABLET BY MOUTH EVERY DAY, Disp: 30 tablet, Rfl: 1   tiZANidine (ZANAFLEX) 4 MG tablet, Take 4 mg by mouth every 8 (eight) hours as needed., Disp: , Rfl:    traMADol (ULTRAM) 50 MG tablet, Take 50 mg by mouth every 8 (eight) hours as needed for moderate pain. , Disp: , Rfl:    TURMERIC PO, Take by mouth., Disp: , Rfl:    vitamin E 400 UNIT capsule, Take 400 Units by mouth daily., Disp: , Rfl:    vitamin k 100 MCG tablet, Take 100 mcg by mouth daily., Disp: , Rfl:    atorvastatin (LIPITOR) 20 MG tablet, Take 1 tablet (20 mg total) by mouth daily., Disp: 90 tablet, Rfl: 1   clonazePAM (KLONOPIN) 0.5 MG tablet, TAKE 1 TABLET BY MOUTH  TWICE A DAY AS NEEDED (Patient not taking: Reported on 07/02/2021), Disp: 60 tablet, Rfl: 0   Allergies  Allergen Reactions   Meloxicam Other (See Comments)    Bruised her legs all over     Nsaids Other (See Comments)    Scarring and bruising of legs   Sulfa Antibiotics Other (See Comments)    Mouth broke out in sores   Erythromycin Nausea And Vomiting     Review of Systems  Constitutional: Negative.   Eyes: Negative.   Respiratory: Negative.    Cardiovascular: Negative.   Gastrointestinal: Negative.   Endocrine: Negative for polydipsia, polyphagia and polyuria.  Musculoskeletal: Negative.   Skin: Negative.   Neurological: Negative.   Psychiatric/Behavioral: Negative.       Today's Vitals   08/24/21 0833  BP: 112/80  Pulse: 74  Temp: 98.4 F (36.9 C)  Weight: 203 lb 9.6 oz (92.4 kg)  Height: 5' (1.524 m)  PainSc: 0-No pain   Body mass index is 39.76 kg/m.  Wt Readings from Last 3 Encounters:  08/24/21 203 lb 9.6 oz (92.4 kg)  07/02/21 204 lb 12.8 oz (92.9 kg)  04/19/21 200 lb 6.4 oz (90.9 kg)     Objective:  Physical Exam Vitals and nursing note reviewed.  Constitutional:      Appearance: Normal appearance. She is obese.  HENT:     Head: Normocephalic and atraumatic.  Eyes:     Extraocular Movements: Extraocular movements intact.  Cardiovascular:     Rate and Rhythm: Normal rate and regular rhythm.     Heart sounds: Normal heart sounds.  Pulmonary:     Effort: Pulmonary effort is normal.     Breath sounds: Normal breath sounds.  Musculoskeletal:     Cervical back: Normal range of motion.  Skin:    General: Skin is warm.  Neurological:     General: No focal deficit present.     Mental Status: She is alert.  Psychiatric:        Mood and Affect: Mood normal.        Behavior: Behavior normal.        Assessment And Plan:     1. Pure hypercholesterolemia Comments: Chronic, currently on atorvastatin 45m daily. I planned to check lipid panel, but she  states headache specialist said her lipids were "fine".   2. Prediabetes Comments: Her a1c has been elevated in  the past. I will recheck her level today. She is encouraged to cut back on her intake of candy.  - Hemoglobin A1c - BMP8+eGFR  3. Class 2 obesity due to excess calories without serious comorbidity with body mass index (BMI) of 39.0 to 39.9 in adult Comments: She is aware of 3 lb weight gain, encouraged to cut back on her intake of refined carbs and to aim for at least 150 minutes of exercise per week.   4. Immunization due - Pneumococcal conjugate vaccine 20-valent (Prevnar-20)   Patient was given opportunity to ask questions. Patient verbalized understanding of the plan and was able to repeat key elements of the plan. All questions were answered to their satisfaction.   I, Maximino Greenland, MD, have reviewed all documentation for this visit. The documentation on 08/24/21 for the exam, diagnosis, procedures, and orders are all accurate and complete. c  IF YOU HAVE BEEN REFERRED TO A SPECIALIST, IT MAY TAKE 1-2 WEEKS TO SCHEDULE/PROCESS THE REFERRAL. IF YOU HAVE NOT HEARD FROM US/SPECIALIST IN TWO WEEKS, PLEASE GIVE [place] A CALL AT [phone] X [phone].   THE PATIENT IS ENCOURAGED TO PRACTICE SOCIAL DISTANCING DUE TO THE COVID-19 PANDEMIC.

## 2021-08-25 LAB — BMP8+EGFR
BUN/Creatinine Ratio: 13 (ref 12–28)
BUN: 9 mg/dL (ref 8–27)
CO2: 22 mmol/L (ref 20–29)
Calcium: 9.3 mg/dL (ref 8.7–10.3)
Chloride: 106 mmol/L (ref 96–106)
Creatinine, Ser: 0.7 mg/dL (ref 0.57–1.00)
Glucose: 102 mg/dL — ABNORMAL HIGH (ref 70–99)
Potassium: 4.9 mmol/L (ref 3.5–5.2)
Sodium: 144 mmol/L (ref 134–144)
eGFR: 96 mL/min/{1.73_m2} (ref >59)

## 2021-08-25 LAB — HEMOGLOBIN A1C
Est. average glucose Bld gHb Est-mCnc: 128 mg/dL
Hgb A1c MFr Bld: 6.1 % — ABNORMAL HIGH (ref 4.8–5.6)

## 2021-09-02 ENCOUNTER — Ambulatory Visit: Payer: 59 | Admitting: Allergy

## 2021-09-02 ENCOUNTER — Encounter: Payer: Self-pay | Admitting: Allergy

## 2021-09-02 VITALS — BP 130/64 | HR 82 | Temp 97.9°F | Resp 24 | Wt 207.8 lb

## 2021-09-02 DIAGNOSIS — N3281 Overactive bladder: Secondary | ICD-10-CM | POA: Insufficient documentation

## 2021-09-02 DIAGNOSIS — H1013 Acute atopic conjunctivitis, bilateral: Secondary | ICD-10-CM | POA: Diagnosis not present

## 2021-09-02 DIAGNOSIS — J4540 Moderate persistent asthma, uncomplicated: Secondary | ICD-10-CM | POA: Diagnosis not present

## 2021-09-02 DIAGNOSIS — R339 Retention of urine, unspecified: Secondary | ICD-10-CM | POA: Insufficient documentation

## 2021-09-02 DIAGNOSIS — J3089 Other allergic rhinitis: Secondary | ICD-10-CM

## 2021-09-02 DIAGNOSIS — Z780 Asymptomatic menopausal state: Secondary | ICD-10-CM | POA: Insufficient documentation

## 2021-09-02 MED ORDER — MONTELUKAST SODIUM 10 MG PO TABS: 10.0000 mg | ORAL_TABLET | Freq: Every day | ORAL | 5 refills | Status: DC

## 2021-09-02 MED ORDER — BREO ELLIPTA 200-25 MCG/ACT IN AEPB
1.0000 | INHALATION_SPRAY | Freq: Every day | RESPIRATORY_TRACT | 5 refills | Status: DC
Start: 2021-09-02 — End: 2021-12-30

## 2021-09-02 NOTE — Patient Instructions (Addendum)
Asthma - use Breo 1 puff once a day. Rinse mouth after ues - albuterol as needed 2 puffs every 4-6 hours for cough, wheeze, chest tightness, difficulty breathing - continue singulair 10mg  at bedtime daily - your lung function testing remains normal but is a little bit lower than it was 6 months ago  Asthma control goals:  Full participation in all desired activities (may need albuterol before activity) Albuterol use two time or less a week on average (not counting use with activity) Cough interfering with sleep two time or less a month Oral steroids no more than once a year No hospitalizations  Allergies - continue singulair as above - use Allegra 180mg  as needed - use nasal Atrovent 1-2 sprays each nostril up to 3-4 times a day as needed for nasal drainage or congestion.    Follow-up in this winter prior to your scheduled trip or sooner if needed

## 2021-09-02 NOTE — Progress Notes (Signed)
Follow-up Note  RE: Olivia Paul MRN: [id number] DOB: September 19, 1955 Date of Office Visit: 09/02/2021   History of present illness: Olivia Paul is a 66 y.o. female presenting today for follow-up of asthma and allergic rhinitis.  She was last seen in the office on 02/18/21 by myself.  She does feel that her asthma has been a little worse lately with the heat of the summer as well as the poor air quality.  Due to that she has been taking Breo daily consistently for past month.  She can tell a difference with consistent use of Breo then when she has not been consistent.  She has had to use albuterol this summer as she has had symptoms of wheezing and shortness of breath. She continues singulair daily.  She has not had any ED/UC visits for asthma nor systemic steroids since last visit.  She will take Allegra as needed at this time.  She has not had any significant nasal congestion, drainage or sneezing type symptoms.  She does still have frontal type headaches but does not have any sinus pressure or other congestion symptoms.  She has seen neurologist for headaches and does have medicines for headache control however they do seem to be causing some sedation type issues as it can be hard to get out of bed the next day.  Injections for migraines has been discussed but at this time does not want to do any injections into the scalp or head.  She did see her ENT after last visit with me who thinks that the headaches are likely from musculoskeletal issues with her neck.  She does get injections into the neck.  She states there is no concern for inner ear symptoms with the ENT as a cause of continued headache. She does have the nasal Atrovent at home but at this time has not been taking it but does plan to start using it as needed more to see if it may help with her headache control. She is planning to retire this year with plans to have a retirement trip to [place] in December!  Review of systems: Review  of Systems  Constitutional: Negative.   HENT: Negative.    Eyes: Negative.   Respiratory:  Positive for shortness of breath and wheezing.   Cardiovascular: Negative.   Gastrointestinal: Negative.   Musculoskeletal: Negative.   Skin: Negative.   Allergic/Immunologic: Negative.   Neurological:  Positive for headaches.     All other systems negative unless noted above in HPI  Past medical/social/surgical/family history have been reviewed and are unchanged unless specifically indicated below.  No changes  Medication List: Current Outpatient Medications  Medication Sig Dispense Refill   albuterol (PROVENTIL) (2.5 MG/3ML) 0.083% nebulizer solution Use 1 vial every 4-6 hours as needed for cough, wheeze, shortness of breath or chest tightness 150 mL 0   albuterol (VENTOLIN HFA) 108 (90 Base) MCG/ACT inhaler Inhale 2 puffs into the lungs every 4 (four) hours as needed for wheezing or shortness of breath. 18 g 1   Ascorbic Acid (VITAMIN C) 100 MG tablet Take 100 mg by mouth daily.     atorvastatin (LIPITOR) 20 MG tablet Take 1 tablet (20 mg total) by mouth daily. 90 tablet 1   BREO ELLIPTA 200-25 MCG/ACT AEPB Inhale 1 puff into the lungs daily. 1 each 5   Cholecalciferol (VITAMIN D) 50 MCG (2000 UT) CAPS Take 5,000 Units by mouth daily.      DULoxetine (CYMBALTA) 60 MG capsule  TAKE 1 CAPSULE BY MOUTH EVERY DAY 90 capsule 2   EPINEPHrine 0.3 mg/0.3 mL IJ SOAJ injection Inject 0.3 mLs (0.3 mg total) into the muscle as needed for anaphylaxis. 1 each 1   fexofenadine (ALLEGRA) 180 MG tablet TAKE ONE TABLET ONCE DAILY FOR RUNNY NOSE OR ITCHING 90 tablet 1   gabapentin (NEURONTIN) 300 MG capsule daily.      ipratropium (ATROVENT) 0.06 % nasal spray Place 2 sprays into both nostrils 4 (four) times daily as needed (Nasal congestion or drainage). 15 mL 5   Krill Oil (OMEGA-3) 500 MG CAPS Take 500 mg by mouth daily.     magnesium oxide (MAG-OX) 400 MG tablet Take 400 mg by mouth daily.     meclizine  (ANTIVERT) 50 MG tablet Take 1/2 tab po qpm prn 30 tablet 0   Menthol-Methyl Salicylate (TIGER BALM LINIMENT EX) Apply 1 application topically at bedtime as needed (pain).     montelukast (SINGULAIR) 10 MG tablet Take 1 tablet (10 mg total) by mouth at bedtime. 30 tablet 5   oxybutynin (DITROPAN-XL) 10 MG 24 hr tablet Take 10 mg by mouth daily.     pantoprazole (PROTONIX) 40 MG tablet TAKE 1 TABLET BY MOUTH EVERY DAY 30 tablet 1   tiZANidine (ZANAFLEX) 4 MG tablet Take 4 mg by mouth every 8 (eight) hours as needed.     traMADol (ULTRAM) 50 MG tablet Take 50 mg by mouth every 8 (eight) hours as needed for moderate pain.      TURMERIC PO Take by mouth.     vitamin E 400 UNIT capsule Take 400 Units by mouth daily.     vitamin k 100 MCG tablet Take 100 mcg by mouth daily.     clonazePAM (KLONOPIN) 0.5 MG tablet TAKE 1 TABLET BY MOUTH TWICE A DAY AS NEEDED (Patient not taking: Reported on 07/02/2021) 60 tablet 0   No current facility-administered medications for this visit.     Known medication allergies: Allergies  Allergen Reactions   Meloxicam Other (See Comments)    Bruised her legs all over     Nsaids Other (See Comments)    Scarring and bruising of legs   Sulfa Antibiotics Other (See Comments)    Mouth broke out in sores   Cat Hair Extract    Pollen Extract    Erythromycin Nausea And Vomiting     Physical examination: Blood pressure 130/64, pulse 82, temperature 97.9 F (36.6 C), temperature source Temporal, resp. rate (!) 24, weight 207 lb 12.8 oz (94.3 kg), SpO2 97 %.  General: Alert, interactive, in no acute distress. HEENT: PERRLA, TMs pearly gray, turbinates non-edematous without discharge, post-pharynx non erythematous. Neck: Supple without lymphadenopathy. Lungs: Clear to auscultation without wheezing, rhonchi or rales. {no increased work of breathing. CV: Normal S1, S2 without murmurs. Abdomen: Nondistended, nontender. Skin: Warm and dry, without lesions or  rashes. Extremities:  No clubbing, cyanosis or edema. Neuro:   Grossly intact.  Diagnositics/Labs: Spirometry: FEV1: 1.45L 82%, FVC: 1.6L 72%, ratio consistent with nonobstructive pattern but is a bit reduced from previous study.    Assessment and plan:   Asthma - use Breo 1 puff once a day. Rinse mouth after ues - albuterol as needed 2 puffs every 4-6 hours for cough, wheeze, chest tightness, difficulty breathing - continue singulair 10mg  at bedtime daily - your lung function testing remains normal but is a little bit lower than it was 6 months ago  Asthma control goals:  Full participation  in all desired activities (may need albuterol before activity) Albuterol use two time or less a week on average (not counting use with activity) Cough interfering with sleep two time or less a month Oral steroids no more than once a year No hospitalizations  Allergic rhinitis with conjunctivitis - continue singulair as above - use Allegra 180mg  as needed - use nasal Atrovent 1-2 sprays each nostril up to 3-4 times a day as needed for nasal drainage or congestion.    Follow-up in this winter prior to your scheduled trip or sooner if needed  I appreciate the opportunity to take part in Olivia Paul's care. Please do not hesitate to contact me with questions.  Sincerely,   05-02-1985, MD Allergy/Immunology [facility]

## 2021-09-15 ENCOUNTER — Ambulatory Visit: Payer: 59

## 2021-09-15 DIAGNOSIS — R400 Somnolence: Secondary | ICD-10-CM

## 2021-09-22 ENCOUNTER — Ambulatory Visit: Payer: 59

## 2021-09-22 ENCOUNTER — Encounter (INDEPENDENT_AMBULATORY_CARE_PROVIDER_SITE_OTHER): Payer: Self-pay

## 2021-09-22 DIAGNOSIS — G4733 Obstructive sleep apnea (adult) (pediatric): Secondary | ICD-10-CM | POA: Diagnosis not present

## 2021-10-04 DIAGNOSIS — G4733 Obstructive sleep apnea (adult) (pediatric): Secondary | ICD-10-CM | POA: Diagnosis not present

## 2021-10-05 ENCOUNTER — Telehealth: Payer: Self-pay | Admitting: Adult Health

## 2021-10-05 NOTE — Telephone Encounter (Signed)
Called and got patient scheduled for mychart video visit with TP   10/12/21 2:30pm  Nothing further needed

## 2021-10-05 NOTE — Telephone Encounter (Signed)
Home sleep study done on September 22, 2021 shows moderate sleep apnea with AHI at 15/hour and SPO2 low at 84% Please set up office visit to discuss sleep study results may be in person or virtual and may double book.

## 2021-10-05 NOTE — Telephone Encounter (Signed)
-----   Message from Courtney Paris sent at 10/04/2021  3:50 PM EDT ----- Hello: this patient's HST report is ready for review

## 2021-10-12 ENCOUNTER — Encounter: Payer: Self-pay | Admitting: Adult Health

## 2021-10-12 ENCOUNTER — Telehealth (INDEPENDENT_AMBULATORY_CARE_PROVIDER_SITE_OTHER): Payer: 59 | Admitting: Adult Health

## 2021-10-12 DIAGNOSIS — R400 Somnolence: Secondary | ICD-10-CM

## 2021-10-12 DIAGNOSIS — G4733 Obstructive sleep apnea (adult) (pediatric): Secondary | ICD-10-CM | POA: Diagnosis not present

## 2021-10-12 NOTE — Progress Notes (Signed)
Reviewed and agree with assessment/plan.   Coralyn Helling, MD [facility] Pulmonary/Critical Care 10/12/2021, 2:41 PM Pager:  [phone]

## 2021-10-12 NOTE — Patient Instructions (Signed)
Begin CPAP at bedtime.  Goal is to wear all night long for at least 6 or more hours. Work on healthy weight Do not drive if sleepy Healthy sleep regimen Up in 3 months and as needed

## 2021-10-12 NOTE — Progress Notes (Signed)
Virtual Visit via Video Note  I connected with Olivia Paul on 10/12/21 at  2:30 PM EDT by a video enabled telemedicine application and verified that I am speaking with the correct person using two identifiers.  Location: Patient: Home  Provider: Office    I discussed the limitations of evaluation and management by telemedicine and the availability of in person appointments. The patient expressed understanding and agreed to proceed.  History of Present Illness: 66 year old female seen for sleep consult Jul 02, 2021 for daytime sleepiness restless sleep found to have moderate obstructive sleep apnea  Today's video visit is a 72-month follow-up for sleep apnea.  Patient was seen last visit for sleep consult.  She was having restless sleep and daytime sleepiness.  She was set up for home sleep study that was completed on September 22, 2021 that showed moderate sleep apnea with AHI at 15/hour and SPO2 low at 84%.  We discussed her sleep study results.  Went over treatment options including weight loss, positional sleep and CPAP.  Patient would like to proceed with CPAP therapy .   Past Medical History:  Diagnosis Date   Arthritis    lt knee   Asthma 2011   Chest pain    Complication of anesthesia    Fatty liver    Headache    sinus   PONV (postoperative nausea and vomiting)    Pre-diabetes    Recurrent upper respiratory infection (URI)    Vertigo    Vitamin D deficiency    Current Outpatient Medications on File Prior to Visit  Medication Sig Dispense Refill   albuterol (PROVENTIL) (2.5 MG/3ML) 0.083% nebulizer solution Use 1 vial every 4-6 hours as needed for cough, wheeze, shortness of breath or chest tightness 150 mL 0   albuterol (VENTOLIN HFA) 108 (90 Base) MCG/ACT inhaler Inhale 2 puffs into the lungs every 4 (four) hours as needed for wheezing or shortness of breath. 18 g 1   Ascorbic Acid (VITAMIN C) 100 MG tablet Take 100 mg by mouth daily.     atorvastatin (LIPITOR) 20 MG  tablet Take 1 tablet (20 mg total) by mouth daily. 90 tablet 1   BREO ELLIPTA 200-25 MCG/ACT AEPB Inhale 1 puff into the lungs daily. 1 each 5   Cholecalciferol (VITAMIN D) 50 MCG (2000 UT) CAPS Take 5,000 Units by mouth daily.      DULoxetine (CYMBALTA) 60 MG capsule TAKE 1 CAPSULE BY MOUTH EVERY DAY 90 capsule 2   EPINEPHrine 0.3 mg/0.3 mL IJ SOAJ injection Inject 0.3 mLs (0.3 mg total) into the muscle as needed for anaphylaxis. 1 each 1   fexofenadine (ALLEGRA) 180 MG tablet TAKE ONE TABLET ONCE DAILY FOR RUNNY NOSE OR ITCHING 90 tablet 1   gabapentin (NEURONTIN) 300 MG capsule daily.      ipratropium (ATROVENT) 0.06 % nasal spray Place 2 sprays into both nostrils 4 (four) times daily as needed (Nasal congestion or drainage). 15 mL 5   Krill Oil (OMEGA-3) 500 MG CAPS Take 500 mg by mouth daily.     magnesium oxide (MAG-OX) 400 MG tablet Take 400 mg by mouth daily.     meclizine (ANTIVERT) 50 MG tablet Take 1/2 tab po qpm prn 30 tablet 0   Menthol-Methyl Salicylate (TIGER BALM LINIMENT EX) Apply 1 application topically at bedtime as needed (pain).     montelukast (SINGULAIR) 10 MG tablet Take 1 tablet (10 mg total) by mouth at bedtime. 30 tablet 5   oxybutynin (DITROPAN-XL) 10  MG 24 hr tablet Take 10 mg by mouth daily.     pantoprazole (PROTONIX) 40 MG tablet TAKE 1 TABLET BY MOUTH EVERY DAY 30 tablet 1   tiZANidine (ZANAFLEX) 4 MG tablet Take 4 mg by mouth every 8 (eight) hours as needed.     traMADol (ULTRAM) 50 MG tablet Take 50 mg by mouth every 8 (eight) hours as needed for moderate pain.      TURMERIC PO Take by mouth.     vitamin E 400 UNIT capsule Take 400 Units by mouth daily.     vitamin k 100 MCG tablet Take 100 mcg by mouth daily.     clonazePAM (KLONOPIN) 0.5 MG tablet TAKE 1 TABLET BY MOUTH TWICE A DAY AS NEEDED (Patient not taking: Reported on 10/12/2021) 60 tablet 0   zonisamide (ZONEGRAN) 50 MG capsule Take 50 mg by mouth at bedtime.     No current facility-administered  medications on file prior to visit.       Observations/Objective:  TEST/EVENTS :  Home sleep study done on September 22, 2021 shows moderate sleep apnea with AHI at 15/hour and SPO2 low at 84%  Assessment and Plan: Moderate obstructive sleep apnea.  Patient education on sleep apnea and treatment options  - discussed how weight can impact sleep and risk for sleep disordered breathing - discussed options to assist with weight loss: combination of diet modification, cardiovascular and strength training exercises   - had an extensive discussion regarding the adverse health consequences related to untreated sleep disordered breathing - specifically discussed the risks for hypertension, coronary artery disease, cardiac dysrhythmias, cerebrovascular disease, and diabetes - lifestyle modification discussed   - discussed how sleep disruption can increase risk of accidents, particularly when driving - safe driving practices were discussed   Plan  Patient Instructions  Begin CPAP at bedtime.  Goal is to wear all night long for at least 6 or more hours. Work on healthy weight Do not drive if sleepy Healthy sleep regimen Up in 3 months and as needed    Follow Up Instructions:    I discussed the assessment and treatment plan with the patient. The patient was provided an opportunity to ask questions and all were answered. The patient agreed with the plan and demonstrated an understanding of the instructions.   The patient was advised to call back or seek an in-person evaluation if the symptoms worsen or if the condition fails to improve as anticipated.  I provided 22  minutes of non-face-to-face time during this encounter.   Rubye Oaks, NP

## 2021-11-08 ENCOUNTER — Other Ambulatory Visit: Payer: Self-pay | Admitting: Internal Medicine

## 2021-12-23 ENCOUNTER — Other Ambulatory Visit: Payer: Self-pay | Admitting: Internal Medicine

## 2021-12-23 DIAGNOSIS — Z1231 Encounter for screening mammogram for malignant neoplasm of breast: Secondary | ICD-10-CM

## 2021-12-27 ENCOUNTER — Ambulatory Visit: Payer: 59 | Admitting: Internal Medicine

## 2021-12-27 ENCOUNTER — Encounter: Payer: Self-pay | Admitting: Internal Medicine

## 2021-12-27 VITALS — BP 120/78 | HR 86 | Temp 97.9°F | Ht 60.0 in | Wt 209.0 lb

## 2021-12-27 DIAGNOSIS — F39 Unspecified mood [affective] disorder: Secondary | ICD-10-CM | POA: Diagnosis not present

## 2021-12-27 DIAGNOSIS — L309 Dermatitis, unspecified: Secondary | ICD-10-CM

## 2021-12-27 DIAGNOSIS — R7303 Prediabetes: Secondary | ICD-10-CM | POA: Diagnosis not present

## 2021-12-27 DIAGNOSIS — Z6841 Body Mass Index (BMI) 40.0 and over, adult: Secondary | ICD-10-CM

## 2021-12-27 LAB — CMP14+EGFR
ALT: 10 IU/L (ref 0–32)
AST: 15 IU/L (ref 0–40)
Albumin/Globulin Ratio: 1.5 (ref 1.2–2.2)
Albumin: 4.1 g/dL (ref 3.9–4.9)
Alkaline Phosphatase: 147 IU/L — ABNORMAL HIGH (ref 44–121)
BUN/Creatinine Ratio: 14 (ref 12–28)
BUN: 10 mg/dL (ref 8–27)
Bilirubin Total: 0.5 mg/dL (ref 0.0–1.2)
CO2: 25 mmol/L (ref 20–29)
Calcium: 9.7 mg/dL (ref 8.7–10.3)
Chloride: 103 mmol/L (ref 96–106)
Creatinine, Ser: 0.74 mg/dL (ref 0.57–1.00)
Globulin, Total: 2.8 g/dL (ref 1.5–4.5)
Glucose: 89 mg/dL (ref 70–99)
Potassium: 4.6 mmol/L (ref 3.5–5.2)
Sodium: 144 mmol/L (ref 134–144)
Total Protein: 6.9 g/dL (ref 6.0–8.5)
eGFR: 90 mL/min/{1.73_m2} (ref >59)

## 2021-12-27 LAB — CBC WITH DIFFERENTIAL/PLATELET
Basophils Absolute: 0.1 10*3/uL (ref 0.0–0.2)
Basos: 1 %
EOS (ABSOLUTE): 0.2 10*3/uL (ref 0.0–0.4)
Eos: 2 %
Hematocrit: 39.4 % (ref 34.0–46.6)
Hemoglobin: 12.8 g/dL (ref 11.1–15.9)
Immature Grans (Abs): 0 10*3/uL (ref 0.0–0.1)
Immature Granulocytes: 0 %
Lymphocytes Absolute: 2.4 10*3/uL (ref 0.7–3.1)
Lymphs: 24 %
MCH: 26.8 pg (ref 26.6–33.0)
MCHC: 32.5 g/dL (ref 31.5–35.7)
MCV: 82 fL (ref 79–97)
Monocytes Absolute: 0.6 10*3/uL (ref 0.1–0.9)
Monocytes: 6 %
Neutrophils Absolute: 6.6 10*3/uL (ref 1.4–7.0)
Neutrophils: 67 %
Platelets: 412 10*3/uL (ref 150–450)
RBC: 4.78 x10E6/uL (ref 3.77–5.28)
RDW: 14.2 % (ref 11.7–15.4)
WBC: 9.8 10*3/uL (ref 3.4–10.8)

## 2021-12-27 LAB — HEMOGLOBIN A1C
Est. average glucose Bld gHb Est-mCnc: 128 mg/dL
Hgb A1c MFr Bld: 6.1 % — ABNORMAL HIGH (ref 4.8–5.6)

## 2021-12-27 MED ORDER — TRIAMCINOLONE ACETONIDE 0.1 % EX CREA: TOPICAL_CREAM | CUTANEOUS | 0 refills | Status: DC

## 2021-12-27 NOTE — Progress Notes (Signed)
Barnet Glasgow Martin,acting as a Education administrator for Maximino Greenland, MD.,have documented all relevant documentation on the behalf of Maximino Greenland, MD,as directed by  Maximino Greenland, MD while in the presence of Maximino Greenland, MD.    Subjective:     Patient ID: Olivia Paul , female    DOB: 06/26/1955 , 66 y.o.   MRN: [id number]   Chief Complaint  Patient presents with   Prediabetes    HPI  Patient presents today for prediabetes check, patient reports compliance with medications. She denies having any headaches, chest pain and shortness of breath.  She is concerned about a rash on her arm. Patient states she has a few bumps on her arm that itch, she states she goes to the allergist Wednesday.  Patient states she initially had a bugbite, but when it spread she thinks it is caused by something else. She denies finding any new bugs in her home, she reports there was a "bug" issue at her job.   BP Readings from Last 3 Encounters: 12/27/21 : 120/78 09/02/21 : 130/64 08/24/21 : 112/80       Past Medical History:  Diagnosis Date   Arthritis    lt knee   Asthma 2011   Chest pain    Complication of anesthesia    Fatty liver    Headache    sinus   PONV (postoperative nausea and vomiting)    Pre-diabetes    Recurrent upper respiratory infection (URI)    Vertigo    Vitamin D deficiency      Family History  Problem Relation Age of Onset   Kidney disease Mother    Hypertension Mother    Allergic rhinitis Mother    Heart disease Father    Allergic rhinitis Father    Asthma Father    Urticaria Sister    Breast cancer Paternal Grandmother    Breast cancer Maternal Aunt    Breast cancer Paternal Aunt    Angioedema Neg Hx    Atopy Neg Hx    Eczema Neg Hx    Immunodeficiency Neg Hx      Current Outpatient Medications:    albuterol (PROVENTIL) (2.5 MG/3ML) 0.083% nebulizer solution, Use 1 vial every 4-6 hours as needed for cough, wheeze, shortness of breath or chest tightness,  Disp: 150 mL, Rfl: 0   albuterol (VENTOLIN HFA) 108 (90 Base) MCG/ACT inhaler, Inhale 2 puffs into the lungs every 4 (four) hours as needed for wheezing or shortness of breath., Disp: 18 g, Rfl: 1   Ascorbic Acid (VITAMIN C) 100 MG tablet, Take 100 mg by mouth daily., Disp: , Rfl:    atorvastatin (LIPITOR) 20 MG tablet, Take 1 tablet (20 mg total) by mouth daily., Disp: 90 tablet, Rfl: 1   BREO ELLIPTA 200-25 MCG/ACT AEPB, Inhale 1 puff into the lungs daily., Disp: 1 each, Rfl: 5   Cholecalciferol (VITAMIN D) 50 MCG (2000 UT) CAPS, Take 5,000 Units by mouth daily. , Disp: , Rfl:    DULoxetine (CYMBALTA) 60 MG capsule, TAKE 1 CAPSULE BY MOUTH EVERY DAY, Disp: 30 capsule, Rfl: 8   EPINEPHrine 0.3 mg/0.3 mL IJ SOAJ injection, Inject 0.3 mLs (0.3 mg total) into the muscle as needed for anaphylaxis., Disp: 1 each, Rfl: 1   fexofenadine (ALLEGRA) 180 MG tablet, TAKE ONE TABLET ONCE DAILY FOR RUNNY NOSE OR ITCHING, Disp: 90 tablet, Rfl: 1   gabapentin (NEURONTIN) 300 MG capsule, daily. , Disp: , Rfl:    ipratropium (  ATROVENT) 0.06 % nasal spray, Place 2 sprays into both nostrils 4 (four) times daily as needed (Nasal congestion or drainage)., Disp: 15 mL, Rfl: 5   Krill Oil (OMEGA-3) 500 MG CAPS, Take 500 mg by mouth daily., Disp: , Rfl:    magnesium oxide (MAG-OX) 400 MG tablet, Take 400 mg by mouth daily., Disp: , Rfl:    meclizine (ANTIVERT) 50 MG tablet, Take 1/2 tab po qpm prn, Disp: 30 tablet, Rfl: 0   Menthol-Methyl Salicylate (TIGER BALM LINIMENT EX), Apply 1 application topically at bedtime as needed (pain)., Disp: , Rfl:    montelukast (SINGULAIR) 10 MG tablet, Take 1 tablet (10 mg total) by mouth at bedtime., Disp: 30 tablet, Rfl: 5   oxybutynin (DITROPAN-XL) 10 MG 24 hr tablet, Take 10 mg by mouth daily., Disp: , Rfl:    pantoprazole (PROTONIX) 40 MG tablet, TAKE 1 TABLET BY MOUTH EVERY DAY, Disp: 30 tablet, Rfl: 1   tiZANidine (ZANAFLEX) 4 MG tablet, Take 4 mg by mouth every 8 (eight) hours as  needed., Disp: , Rfl:    traMADol (ULTRAM) 50 MG tablet, Take 50 mg by mouth every 8 (eight) hours as needed for moderate pain. , Disp: , Rfl:    triamcinolone cream (KENALOG) 0.1 %, APPLY TO AFFECTED AREA TWICE DAILY AS NEEDED, Disp: 45 g, Rfl: 0   TURMERIC PO, Take by mouth., Disp: , Rfl:    vitamin E 400 UNIT capsule, Take 400 Units by mouth daily., Disp: , Rfl:    vitamin k 100 MCG tablet, Take 100 mcg by mouth daily., Disp: , Rfl:    zonisamide (ZONEGRAN) 50 MG capsule, Take 50 mg by mouth at bedtime., Disp: , Rfl:    clonazePAM (KLONOPIN) 0.5 MG tablet, TAKE 1 TABLET BY MOUTH TWICE A DAY AS NEEDED (Patient not taking: Reported on 10/12/2021), Disp: 60 tablet, Rfl: 0   Allergies  Allergen Reactions   Meloxicam Other (See Comments)    Bruised her legs all over     Nsaids Other (See Comments)    Scarring and bruising of legs   Sulfa Antibiotics Other (See Comments)    Mouth broke out in sores   Cat Hair Extract    Pollen Extract    Erythromycin Nausea And Vomiting     Review of Systems  Constitutional: Negative.   HENT: Negative.    Eyes: Negative.   Respiratory: Negative.    Cardiovascular: Negative.   Gastrointestinal: Negative.   Skin:  Positive for rash.     Today's Vitals   12/27/21 0953  BP: 120/78  Pulse: 86  Temp: 97.9 F (36.6 C)  TempSrc: Oral  Weight: 209 lb (94.8 kg)  Height: 5' (1.524 m)  PainSc: 0-No pain   Body mass index is 40.82 kg/m.  Wt Readings from Last 3 Encounters:  12/27/21 209 lb (94.8 kg)  09/02/21 207 lb 12.8 oz (94.3 kg)  08/24/21 203 lb 9.6 oz (92.4 kg)    Objective:  Physical Exam Vitals and nursing note reviewed.  Constitutional:      Appearance: Normal appearance. She is obese.  HENT:     Head: Normocephalic and atraumatic.     Nose:     Comments: Masked     Mouth/Throat:     Comments: Masked  Eyes:     Extraocular Movements: Extraocular movements intact.  Cardiovascular:     Rate and Rhythm: Normal rate and regular  rhythm.     Heart sounds: Normal heart sounds.  Pulmonary:  Effort: Pulmonary effort is normal.     Breath sounds: Normal breath sounds.  Musculoskeletal:     Cervical back: Normal range of motion.  Skin:    General: Skin is warm.     Comments: She has hyperpigmented lesions on b/l forearms. Some lesions appear to be hyperkeratotic. No surrounding erythema noted.   Neurological:     General: No focal deficit present.     Mental Status: She is alert.  Psychiatric:        Mood and Affect: Mood normal.        Behavior: Behavior normal.      Assessment And Plan:     1. Prediabetes Comments: Chronic, I will check labs as below. I will make further recommendations once her labs are avail for review. - Hemoglobin A1c - CMP14+EGFR - Amb Referral To Provider Referral Exercise Program (P.R.E.P)  2. Dermatitis Comments: I am not sure what has caused her sx. I will send rx triamcinolone cream to apply to affected area daily. I will refer her to Derm if her sx persist. - triamcinolone cream (KENALOG) 0.1 %; APPLY TO AFFECTED AREA TWICE DAILY AS NEEDED  Dispense: 45 g; Refill: 0 - CBC with Diff  3. Mood disorder ([place]) Comments: Chronic, she will c/w duloxetine.  4. Class 3 severe obesity due to excess calories with serious comorbidity and body mass index (BMI) of 40.0 to 44.9 in adult [facility]) Comments: BMI 40. She is encouraged to aim for at least 150 minutes of exercise per week, while initially striving for BMI<35. She agrees to [employer] referral . - Amb Referral To Provider Referral Exercise Program (P.R.E.P)   Patient was given opportunity to ask questions. Patient verbalized understanding of the plan and was able to repeat key elements of the plan. All questions were answered to their satisfaction.   I, Maximino Greenland, MD, have reviewed all documentation for this visit. The documentation on 12/27/21 for the exam, diagnosis, procedures, and orders are all accurate and complete.   IF YOU  HAVE BEEN REFERRED TO A SPECIALIST, IT MAY TAKE 1-2 WEEKS TO SCHEDULE/PROCESS THE REFERRAL. IF YOU HAVE NOT HEARD FROM US/SPECIALIST IN TWO WEEKS, PLEASE GIVE [place] A CALL AT [phone] X [phone].   THE PATIENT IS ENCOURAGED TO PRACTICE SOCIAL DISTANCING DUE TO THE COVID-19 PANDEMIC.

## 2021-12-27 NOTE — Patient Instructions (Signed)

## 2021-12-30 ENCOUNTER — Ambulatory Visit: Payer: 59 | Admitting: Allergy

## 2021-12-30 ENCOUNTER — Other Ambulatory Visit: Payer: Self-pay

## 2021-12-30 ENCOUNTER — Encounter: Payer: Self-pay | Admitting: Allergy

## 2021-12-30 VITALS — BP 134/86 | HR 85 | Temp 98.5°F | Resp 16 | Ht 60.0 in | Wt 210.3 lb

## 2021-12-30 DIAGNOSIS — J4540 Moderate persistent asthma, uncomplicated: Secondary | ICD-10-CM

## 2021-12-30 DIAGNOSIS — L282 Other prurigo: Secondary | ICD-10-CM

## 2021-12-30 DIAGNOSIS — J3089 Other allergic rhinitis: Secondary | ICD-10-CM | POA: Diagnosis not present

## 2021-12-30 DIAGNOSIS — H1013 Acute atopic conjunctivitis, bilateral: Secondary | ICD-10-CM

## 2021-12-30 DIAGNOSIS — L309 Dermatitis, unspecified: Secondary | ICD-10-CM

## 2021-12-30 MED ORDER — CETIRIZINE HCL 10 MG PO TABS: 10.0000 mg | ORAL_TABLET | Freq: Every day | ORAL | 5 refills | Status: DC

## 2021-12-30 MED ORDER — ALBUTEROL SULFATE HFA 108 (90 BASE) MCG/ACT IN AERS: 2.0000 | INHALATION_SPRAY | RESPIRATORY_TRACT | 1 refills | Status: DC | PRN

## 2021-12-30 MED ORDER — MONTELUKAST SODIUM 10 MG PO TABS: 10.0000 mg | ORAL_TABLET | Freq: Every day | ORAL | 5 refills | Status: DC

## 2021-12-30 MED ORDER — IPRATROPIUM BROMIDE 0.06 % NA SOLN: NASAL | 5 refills | Status: DC

## 2021-12-30 MED ORDER — BREO ELLIPTA 200-25 MCG/ACT IN AEPB: 1.0000 | INHALATION_SPRAY | Freq: Every day | RESPIRATORY_TRACT | 5 refills | Status: DC

## 2021-12-30 NOTE — Progress Notes (Signed)
Follow-up Note  RE: Olivia Paul MRN: [id number] DOB: Jun 26, 1955 Date of Office Visit: 12/30/2021   History of present illness: Olivia Paul is a 66 y.o. female presenting today for follow-up of asthma and allergic rhinitis with conjunctivitis.  She was last seen in the office on 09/02/2021 by myself.  She has not had any major health changes, surgeries or hospitalizations since his last visit.  She has an upcoming trip to [place] in December.  She also will be retiring at the end of December but she is very excited about. She does notice that she has been having a rash on her arms that she believes is due to bites at work.  The rash is itchy.  It has become hyperpigmented.  See her PCP regarding he was prescribed triamcinolone to use that does help with the itching.  She has used calamine and other over the counter anti-itch cream.  She has noted more wheezing with the changes in weather.  She states she has not used albuterol inhaler for the wheeze and it does not resolve.  She continues to use her Breo 1 puff once a day and states she has been consistent with this.  She also takes Singulair daily.  She has not had any ED or urgent care visits or systemic steroid needs. She has been taking zyrtec mostly to help with the itching with the rash as above.  She also is Allegra for allergy symptom relief as well as nasal Atrovent for nasal drainage or congestion  Review of systems: Review of Systems  Constitutional: Negative.   HENT: Negative.    Eyes: Negative.   Respiratory:  Positive for wheezing.   Cardiovascular: Negative.   Gastrointestinal: Negative.   Musculoskeletal: Negative.   Skin:  Positive for rash.  Allergic/Immunologic: Negative.   Neurological: Negative.      All other systems negative unless noted above in HPI  Past medical/social/surgical/family history have been reviewed and are unchanged unless specifically indicated below.  No changes  Medication  List: Current Outpatient Medications  Medication Sig Dispense Refill   albuterol (PROVENTIL) (2.5 MG/3ML) 0.083% nebulizer solution Use 1 vial every 4-6 hours as needed for cough, wheeze, shortness of breath or chest tightness 150 mL 0   Ascorbic Acid (VITAMIN C) 100 MG tablet Take 100 mg by mouth daily.     atorvastatin (LIPITOR) 20 MG tablet Take 1 tablet (20 mg total) by mouth daily. 90 tablet 1   cetirizine (ZYRTEC) 10 MG tablet Take 1 tablet (10 mg total) by mouth daily. 30 tablet 5   Cholecalciferol (VITAMIN D) 50 MCG (2000 UT) CAPS Take 5,000 Units by mouth daily.      clonazePAM (KLONOPIN) 0.5 MG tablet TAKE 1 TABLET BY MOUTH TWICE A DAY AS NEEDED 60 tablet 0   DULoxetine (CYMBALTA) 60 MG capsule TAKE 1 CAPSULE BY MOUTH EVERY DAY 30 capsule 8   EPINEPHrine 0.3 mg/0.3 mL IJ SOAJ injection Inject 0.3 mLs (0.3 mg total) into the muscle as needed for anaphylaxis. 1 each 1   fexofenadine (ALLEGRA) 180 MG tablet TAKE ONE TABLET ONCE DAILY FOR RUNNY NOSE OR ITCHING 90 tablet 1   gabapentin (NEURONTIN) 300 MG capsule daily.      Krill Oil (OMEGA-3) 500 MG CAPS Take 500 mg by mouth daily.     magnesium oxide (MAG-OX) 400 MG tablet Take 400 mg by mouth daily.     meclizine (ANTIVERT) 50 MG tablet Take 1/2 tab po qpm prn 30  tablet 0   Menthol-Methyl Salicylate (TIGER BALM LINIMENT EX) Apply 1 application topically at bedtime as needed (pain).     oxybutynin (DITROPAN-XL) 10 MG 24 hr tablet Take 10 mg by mouth daily.     pantoprazole (PROTONIX) 40 MG tablet TAKE 1 TABLET BY MOUTH EVERY DAY 30 tablet 1   tiZANidine (ZANAFLEX) 4 MG tablet Take 4 mg by mouth every 8 (eight) hours as needed.     traMADol (ULTRAM) 50 MG tablet Take 50 mg by mouth every 8 (eight) hours as needed for moderate pain.      triamcinolone cream (KENALOG) 0.1 % APPLY TO AFFECTED AREA TWICE DAILY AS NEEDED 45 g 0   TURMERIC PO Take by mouth.     vitamin E 400 UNIT capsule Take 400 Units by mouth daily.     vitamin k 100 MCG  tablet Take 100 mcg by mouth daily.     zonisamide (ZONEGRAN) 50 MG capsule Take 50 mg by mouth at bedtime.     albuterol (VENTOLIN HFA) 108 (90 Base) MCG/ACT inhaler Inhale 2 puffs into the lungs every 4 (four) hours as needed for wheezing or shortness of breath. 18 g 1   BREO ELLIPTA 200-25 MCG/ACT AEPB Inhale 1 puff into the lungs daily. 60 each 5   ipratropium (ATROVENT) 0.06 % nasal spray 1-2 sprays each nostril up to 3-4 times a day as needed for nasal drainage or congestion 15 mL 5   montelukast (SINGULAIR) 10 MG tablet Take 1 tablet (10 mg total) by mouth at bedtime. 30 tablet 5   No current facility-administered medications for this visit.     Known medication allergies: Allergies  Allergen Reactions   Meloxicam Other (See Comments)    Bruised her legs all over     Nsaids Other (See Comments)    Scarring and bruising of legs   Sulfa Antibiotics Other (See Comments)    Mouth broke out in sores   Cat Hair Extract    Pollen Extract    Erythromycin Nausea And Vomiting     Physical examination: Blood pressure 134/86, pulse 85, temperature 98.5 F (36.9 C), resp. rate 16, height 5' (1.524 m), weight 210 lb 4.8 oz (95.4 kg), SpO2 96 %.  General: Alert, interactive, in no acute distress. HEENT: PERRLA, TMs pearly gray, turbinates non-edematous without discharge, post-pharynx non erythematous. Neck: Supple without lymphadenopathy. Lungs: Clear to auscultation without wheezing, rhonchi or rales. {no increased work of breathing. CV: Normal S1, S2 without murmurs. Abdomen: Nondistended, nontender. Skin: Hyperpigmented macules some with excoriations on the forearms b/l . Extremities:  No clubbing, cyanosis or edema. Neuro:   Grossly intact.  Diagnositics/Labs:  Spirometry: FEV1: 1.52L 86%, FVC: 1.74L 79%, ratio consistent with non-obstructive pattern  Assessment and plan:   Asthma - use Breo 1 puff once a day. Rinse mouth after ues - albuterol as needed 2 puffs every  4-6 hours for cough, wheeze, chest tightness, difficulty breathing - continue singulair 10mg  at bedtime daily - your lung function testing looks great today and is improved since last visit!  Asthma control goals:  Full participation in all desired activities (may need albuterol before activity) Albuterol use two time or less a week on average (not counting use with activity) Cough interfering with sleep two time or less a month Oral steroids no more than once a year No hospitalizations  Allergies - continue singulair as above - use Allegra 180mg  or Zyrtec 10mg  daily as needed - use nasal Atrovent 1-2  sprays each nostril up to 3-4 times a day as needed for nasal drainage or congestion.    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  Can mix with your cream moisturizer in equal parts and apply daily as moisturizer - use sunscreen (prefer spf 50) daily with your upcoming travels - use Zyrtec or Allegra daily for itch control  Follow-up in 6 months or sooner if needed  I appreciate the opportunity to take part in Parrie's care. Please do not hesitate to contact me with questions.  Sincerely,   Margo Aye, MD Allergy/Immunology [facility]

## 2021-12-30 NOTE — Patient Instructions (Addendum)
Asthma - use Breo 1 puff once a day. Rinse mouth after ues - albuterol as needed 2 puffs every 4-6 hours for cough, wheeze, chest tightness, difficulty breathing - continue singulair 10mg  at bedtime daily - your lung function testing looks great today and is improved since last visit!  Asthma control goals:  Full participation in all desired activities (may need albuterol before activity) Albuterol use two time or less a week on average (not counting use with activity) Cough interfering with sleep two time or less a month Oral steroids no more than once a year No hospitalizations  Allergies - continue singulair as above - use Allegra 180mg  or Zyrtec 10mg  daily as needed - use nasal Atrovent 1-2 sprays each nostril up to 3-4 times a day as needed for nasal drainage or congestion.    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  Can mix with your cream moisturizer in equal parts and apply daily as moisturizer - use sunscreen (prefer spf 50) daily with your upcoming travels - use Zyrtec or Allegra daily for itch control  Follow-up in 6 months or sooner if needed

## 2021-12-31 MED ORDER — TRIAMCINOLONE ACETONIDE 0.1 % EX CREA: TOPICAL_CREAM | CUTANEOUS | 1 refills | Status: AC

## 2022-01-01 ENCOUNTER — Encounter: Payer: Self-pay | Admitting: Internal Medicine

## 2022-01-01 LAB — ALKALINE PHOSPHATASE, ISOENZYMES
Alkaline Phosphatase: 152 IU/L — ABNORMAL HIGH (ref 44–121)
BONE FRACTION: 43 % (ref 14–68)
INTESTINAL FRAC.: 2 % (ref 0–18)
LIVER FRACTION: 55 % (ref 18–85)

## 2022-01-01 LAB — SPECIMEN STATUS REPORT

## 2022-01-03 ENCOUNTER — Other Ambulatory Visit: Payer: 59

## 2022-01-03 ENCOUNTER — Other Ambulatory Visit: Payer: Self-pay

## 2022-01-03 DIAGNOSIS — R748 Abnormal levels of other serum enzymes: Secondary | ICD-10-CM

## 2022-01-04 ENCOUNTER — Encounter: Payer: Self-pay | Admitting: Internal Medicine

## 2022-01-04 LAB — VITAMIN D 25 HYDROXY (VIT D DEFICIENCY, FRACTURES): Vit D, 25-Hydroxy: 56.6 ng/mL (ref 30.0–100.0)

## 2022-01-05 ENCOUNTER — Ambulatory Visit: Payer: 59 | Admitting: Nurse Practitioner

## 2022-01-05 ENCOUNTER — Encounter: Payer: Self-pay | Admitting: Nurse Practitioner

## 2022-01-05 ENCOUNTER — Encounter: Payer: Self-pay | Admitting: Internal Medicine

## 2022-01-05 VITALS — BP 132/76 | HR 83 | Temp 98.3°F | Ht 60.0 in | Wt 210.0 lb

## 2022-01-05 DIAGNOSIS — R350 Frequency of micturition: Secondary | ICD-10-CM | POA: Diagnosis not present

## 2022-01-05 DIAGNOSIS — N3281 Overactive bladder: Secondary | ICD-10-CM | POA: Diagnosis not present

## 2022-01-05 LAB — POCT URINALYSIS DIPSTICK
Bilirubin, UA: NEGATIVE
Blood, UA: NEGATIVE
Glucose, UA: NEGATIVE
Ketones, UA: NEGATIVE
Nitrite, UA: NEGATIVE
Protein, UA: POSITIVE — AB
Spec Grav, UA: 1.025 (ref 1.010–1.025)
Urobilinogen, UA: 1 E.U./dL
pH, UA: 6.5 (ref 5.0–8.0)

## 2022-01-05 MED ORDER — AMOXICILLIN-POT CLAVULANATE 875-125 MG PO TABS: 1.0000 | ORAL_TABLET | Freq: Two times a day (BID) | ORAL | 0 refills | Status: DC

## 2022-01-05 NOTE — Patient Instructions (Signed)

## 2022-01-05 NOTE — Progress Notes (Signed)
I,Olivia Paul,acting as a Neurosurgeon for [employer], FNP.,have documented all relevant documentation on the behalf of Olivia Felts, FNP,as directed by  Olivia Felts, FNP while in the presence of Olivia Felts, FNP.  Subjective:     Patient ID: Olivia Paul , female    DOB: 25-Sep-1955 , 66 y.o.   MRN: [id number]   Chief Complaint  Patient presents with   Urinary Frequency    HPI  Patient presents today for urinary frequency. She was seen last month for similar symptoms. She has been voiding all night. Denies abdominal pain. She does have a history of overactive bladder. She was treated for a urinary tract infection at the Urgent care  Urinary Frequency  This is a new problem. The current episode started in the past 7 days. Associated symptoms include frequency, hesitancy and urgency. Pertinent negatives include no chills, discharge or flank pain. She has tried nothing for the symptoms.     Past Medical History:  Diagnosis Date   Arthritis    lt knee   Asthma 2011   Chest pain    Complication of anesthesia    Fatty liver    Headache    sinus   PONV (postoperative nausea and vomiting)    Pre-diabetes    Recurrent upper respiratory infection (URI)    Vertigo    Vitamin D deficiency      Family History  Problem Relation Age of Onset   Kidney disease Mother    Hypertension Mother    Allergic rhinitis Mother    Heart disease Father    Allergic rhinitis Father    Asthma Father    Urticaria Sister    Breast cancer Paternal Grandmother    Breast cancer Maternal Aunt    Breast cancer Paternal Aunt    Angioedema Neg Hx    Atopy Neg Hx    Eczema Neg Hx    Immunodeficiency Neg Hx      Current Outpatient Medications:    amoxicillin-clavulanate (AUGMENTIN) 875-125 MG tablet, Take 1 tablet by mouth 2 (two) times daily., Disp: 14 tablet, Rfl: 0   albuterol (PROVENTIL) (2.5 MG/3ML) 0.083% nebulizer solution, Use 1 vial every 4-6 hours as needed for cough, wheeze, shortness  of breath or chest tightness, Disp: 150 mL, Rfl: 0   albuterol (VENTOLIN HFA) 108 (90 Base) MCG/ACT inhaler, Inhale 2 puffs into the lungs every 4 (four) hours as needed for wheezing or shortness of breath., Disp: 18 g, Rfl: 1   Ascorbic Acid (VITAMIN C) 100 MG tablet, Take 100 mg by mouth daily., Disp: , Rfl:    atorvastatin (LIPITOR) 20 MG tablet, Take 1 tablet (20 mg total) by mouth daily., Disp: 90 tablet, Rfl: 1   BREO ELLIPTA 200-25 MCG/ACT AEPB, Inhale 1 puff into the lungs daily., Disp: 60 each, Rfl: 5   cetirizine (ZYRTEC) 10 MG tablet, Take 1 tablet (10 mg total) by mouth daily., Disp: 30 tablet, Rfl: 5   Cholecalciferol (VITAMIN D) 50 MCG (2000 UT) CAPS, Take 5,000 Units by mouth daily. , Disp: , Rfl:    clonazePAM (KLONOPIN) 0.5 MG tablet, TAKE 1 TABLET BY MOUTH TWICE A DAY AS NEEDED, Disp: 60 tablet, Rfl: 0   DULoxetine (CYMBALTA) 60 MG capsule, TAKE 1 CAPSULE BY MOUTH EVERY DAY, Disp: 30 capsule, Rfl: 8   EPINEPHrine 0.3 mg/0.3 mL IJ SOAJ injection, Inject 0.3 mLs (0.3 mg total) into the muscle as needed for anaphylaxis., Disp: 1 each, Rfl: 1   fexofenadine (ALLEGRA) 180 MG  tablet, TAKE ONE TABLET ONCE DAILY FOR RUNNY NOSE OR ITCHING, Disp: 90 tablet, Rfl: 1   gabapentin (NEURONTIN) 300 MG capsule, daily. , Disp: , Rfl:    ipratropium (ATROVENT) 0.06 % nasal spray, 1-2 sprays each nostril up to 3-4 times a day as needed for nasal drainage or congestion, Disp: 15 mL, Rfl: 5   Krill Oil (OMEGA-3) 500 MG CAPS, Take 500 mg by mouth daily., Disp: , Rfl:    magnesium oxide (MAG-OX) 400 MG tablet, Take 400 mg by mouth daily., Disp: , Rfl:    meclizine (ANTIVERT) 50 MG tablet, Take 1/2 tab po qpm prn, Disp: 30 tablet, Rfl: 0   Menthol-Methyl Salicylate (TIGER BALM LINIMENT EX), Apply 1 application topically at bedtime as needed (pain)., Disp: , Rfl:    montelukast (SINGULAIR) 10 MG tablet, Take 1 tablet (10 mg total) by mouth at bedtime., Disp: 30 tablet, Rfl: 5   oxybutynin (DITROPAN-XL) 10  MG 24 hr tablet, Take 10 mg by mouth daily., Disp: , Rfl:    pantoprazole (PROTONIX) 40 MG tablet, TAKE 1 TABLET BY MOUTH EVERY DAY, Disp: 30 tablet, Rfl: 1   tiZANidine (ZANAFLEX) 4 MG tablet, Take 4 mg by mouth every 8 (eight) hours as needed., Disp: , Rfl:    traMADol (ULTRAM) 50 MG tablet, Take 50 mg by mouth every 8 (eight) hours as needed for moderate pain. , Disp: , Rfl:    triamcinolone cream (KENALOG) 0.1 %, APPLY TO AFFECTED AREA TWICE DAILY AS NEEDED, Disp: 453.6 g, Rfl: 1   TURMERIC PO, Take by mouth., Disp: , Rfl:    vitamin E 400 UNIT capsule, Take 400 Units by mouth daily., Disp: , Rfl:    vitamin k 100 MCG tablet, Take 100 mcg by mouth daily., Disp: , Rfl:    zonisamide (ZONEGRAN) 50 MG capsule, Take 50 mg by mouth at bedtime., Disp: , Rfl:    Allergies  Allergen Reactions   Meloxicam Other (See Comments)    Bruised her legs all over     Nsaids Other (See Comments)    Scarring and bruising of legs   Sulfa Antibiotics Other (See Comments)    Mouth broke out in sores   Cat Hair Extract    Pollen Extract    Erythromycin Nausea And Vomiting     Review of Systems  Constitutional: Negative.  Negative for chills.  Respiratory: Negative.    Cardiovascular: Negative.   Gastrointestinal: Negative.   Genitourinary:  Positive for frequency, hesitancy and urgency. Negative for flank pain.  Neurological: Negative.      Today's Vitals   01/05/22 0936  BP: 132/76  Pulse: 83  Temp: 98.3 F (36.8 C)  TempSrc: Oral  Weight: 210 lb (95.3 kg)  Height: 5' (1.524 m)   Body mass index is 41.01 kg/m.   Objective:  Physical Exam Vitals reviewed.  Constitutional:      General: She is not in acute distress.    Appearance: Normal appearance.  Pulmonary:     Effort: Pulmonary effort is normal. No respiratory distress.  Neurological:     General: No focal deficit present.     Mental Status: She is alert and oriented to person, place, and time.     Cranial Nerves: No cranial  nerve deficit.     Motor: No weakness.         Assessment And Plan:     1. Urinary frequency Comments: Will treat for UTI due to symptoms. Small leukocytes and positive protein.  Will send for culture. - POCT Urinalysis Dipstick (81002) - Culture, Urine - amoxicillin-clavulanate (AUGMENTIN) 875-125 MG tablet; Take 1 tablet by mouth 2 (two) times daily.  Dispense: 14 tablet; Refill: 0  2. Overactive bladder Comments: She is to follow up with Urology on December 1st. Her symptoms may be resultant in worsening symptoms.     Patient was given opportunity to ask questions. Patient verbalized understanding of the plan and was able to repeat key elements of the plan. All questions were answered to their satisfaction.  Olivia Felts, FNP   I, Olivia Felts, FNP, have reviewed all documentation for this visit. The documentation on 01/05/22 for the exam, diagnosis, procedures, and orders are all accurate and complete.   IF YOU HAVE BEEN REFERRED TO A SPECIALIST, IT MAY TAKE 1-2 WEEKS TO SCHEDULE/PROCESS THE REFERRAL. IF YOU HAVE NOT HEARD FROM US/SPECIALIST IN TWO WEEKS, PLEASE GIVE [place] A CALL AT [phone].   THE PATIENT IS ENCOURAGED TO PRACTICE SOCIAL DISTANCING DUE TO THE COVID-19 PANDEMIC.

## 2022-01-08 LAB — URINE CULTURE

## 2022-01-14 ENCOUNTER — Other Ambulatory Visit: Payer: Self-pay | Admitting: Obstetrics and Gynecology

## 2022-01-14 DIAGNOSIS — M8580 Other specified disorders of bone density and structure, unspecified site: Secondary | ICD-10-CM

## 2022-03-01 ENCOUNTER — Ambulatory Visit
Admission: RE | Admit: 2022-03-01 | Discharge: 2022-03-01 | Disposition: A | Discharge status: Home / Self Care | Payer: Medicare Other | Source: Ambulatory Visit | Attending: Internal Medicine | Admitting: Internal Medicine

## 2022-03-01 DIAGNOSIS — Z1231 Encounter for screening mammogram for malignant neoplasm of breast: Secondary | ICD-10-CM

## 2022-03-04 ENCOUNTER — Telehealth: Payer: Self-pay

## 2022-03-04 NOTE — Telephone Encounter (Signed)
Call to pt about next PREP class starting on 03/28/22. Talked about program and schedule.  Patient also referred to DPP (virtual program) Forwarded flyer for PREP to pt for information.  She would like to do both programs.  Explained the fee for PREP. Requested she call me back if the 03/28/22 MW 230p-345pm works for her.

## 2022-03-10 ENCOUNTER — Other Ambulatory Visit: Payer: Self-pay | Admitting: Internal Medicine

## 2022-03-18 ENCOUNTER — Telehealth: Payer: Self-pay

## 2022-03-18 NOTE — Telephone Encounter (Signed)
Call to pt reference next PREP class starting on 03/28/22. Left message requesting call back to discuss.

## 2022-03-24 NOTE — Progress Notes (Signed)
[facility] PREP Evaluation  Patient Details  Name: Olivia Paul MRN: [id number] Date of Birth: 09/30/1955 Age: 67 y.o. PCP: Glendale Chard, MD  Vitals:   03/24/22 1144  BP: 118/74  Pulse: 72  SpO2: 98%  Weight: 209 lb 9.6 oz (95.1 kg)     [facility] Eval - 03/24/22 1100       [facility] "PREP" Location   [facility] "PREP" Location [facility]      Referral    Referring Provider Sanders    Reason for referral Inactivity;High Cholesterol;Obesitity/Overweight   pre-diabetes   Program Start Date 03/28/22   MW 230p-345p x 12wks     Measurement   Waist Circumference 44.5 inches    Hip Circumference 48 inches    Body fat 49.5 percent      Information for Trainer   Goals weight loss of 20 lbs, get A1C under 5.7    Current Exercise none    Orthopedic Concerns L TKR, Right knee pain, neck and back pain, right shoulder pain when raised    Pertinent Medical History prediabetes, high chol, OSA on CPAP, migraines, osteopenia, asthma    Restrictions/Precautions Other   no falls, right knee pain may contribute to challenge balance   Medications that affect exercise Medication causing dizziness/drowsiness;Asthma inhaler   hasn't had to use rescue inhaler in a while     Timed Up and Go (TUGS)   Timed Up and Go Low risk <9 seconds      Mobility and Daily Activities   I find it easy to walk up or down two or more flights of stairs. 1    I have no trouble taking out the trash. 4    I do housework such as vacuuming and dusting on my own without difficulty. 2    I can easily lift a gallon of milk (8lbs). 4    I can easily walk a mile. 1    I have no trouble reaching into high cupboards or reaching down to pick up something from the floor. 2    I do not have trouble doing out-door work such as Armed forces logistics/support/administrative officer, raking leaves, or gardening. 1      Mobility and Daily Activities   I feel younger than my age. 4    I feel independent. 4    I feel energetic. 3    I live an active life.  2    I feel strong.  2    I feel healthy. 2    I feel active as other people my age. 2      How fit and strong are you.   Fit and Strong Total Score 34            Past Medical History:  Diagnosis Date   Arthritis    lt knee   Asthma 2011   Chest pain    Complication of anesthesia    Fatty liver    Headache    sinus   PONV (postoperative nausea and vomiting)    Pre-diabetes    Recurrent upper respiratory infection (URI)    Vertigo    Vitamin D deficiency    Past Surgical History:  Procedure Laterality Date   BREAST BIOPSY     BREAST EXCISIONAL BIOPSY     KNEE ARTHROSCOPY WITH MENISCAL REPAIR Left 02/21/2018   LIPOMA EXCISION     TOTAL KNEE ARTHROPLASTY Left 11/16/2018   Procedure: TOTAL KNEE ARTHROPLASTY;  Surgeon: Dorna Leitz, MD;  Location: [facility];  Service: Orthopedics;  Laterality: Left;   total knee arthroplasty-revision Left 08/31/2020   Dr, Mike Craze   TRIGGER FINGER RELEASE Right 12/25/2020   Social History   Tobacco Use  Smoking Status Never   Passive exposure: Past  Smokeless Tobacco Never    Barnett Hatter 03/24/2022, 11:51 AM

## 2022-03-25 ENCOUNTER — Ambulatory Visit
Admission: RE | Admit: 2022-03-25 | Discharge: 2022-03-25 | Disposition: A | Discharge status: Home / Self Care | Payer: Medicare Other | Source: Ambulatory Visit | Attending: Obstetrics and Gynecology | Admitting: Obstetrics and Gynecology

## 2022-03-25 DIAGNOSIS — M8580 Other specified disorders of bone density and structure, unspecified site: Secondary | ICD-10-CM

## 2022-03-29 ENCOUNTER — Telehealth: Payer: Self-pay

## 2022-03-29 NOTE — Telephone Encounter (Signed)
Call to check on interest in starting PREP. Did not attend 1st class. Still interested.  Has costochondritis and plans on being in on Wednesday for class.

## 2022-03-30 NOTE — Progress Notes (Signed)
[facility] PREP Weekly Session  Patient Details  Name: Olivia Paul MRN: [id number] Date of Birth: [birth year] Age: 67 y.o. PCP: Glendale Chard, MD  There were no vitals filed for this visit.   [facility] Weekly seesion - 03/30/22 1500       [facility] "PREP" Location   [facility] "PREP" Location [facility]      Weekly Session   Topic Discussed Goal setting and welcome to the program   scale of perceived exertion   Classes attended to date 2             Barnett Hatter 03/30/2022, 3:57 PM

## 2022-04-05 NOTE — Progress Notes (Signed)
[facility] PREP Weekly Session  Patient Details  Name: Olivia Paul MRN: [id number] Date of Birth: Mar 14, 1955 Age: 67 y.o. PCP: Glendale Chard, MD  Vitals:   04/04/22 1704  Weight: 210 lb (95.3 kg)     [facility] Weekly seesion - 04/05/22 1700       [facility] "PREP" Location   [facility] "PREP" Location [facility]      Weekly Session   Topic Discussed Importance of resistance training;Other ways to be active    Minutes exercised this week 150 minutes    Classes attended to date [place] 04/05/2022, 5:05 PM

## 2022-04-12 NOTE — Progress Notes (Signed)
[facility] PREP Weekly Session  Patient Details  Name: Olivia Paul MRN: [id number] Date of Birth: 1955-10-07 Age: 67 y.o. PCP: Glendale Chard, MD  Vitals:   04/11/22 1430  Weight: 207 lb 9.6 oz (94.2 kg)     [facility] Weekly seesion - 04/12/22 1100       [facility] "PREP" Location   [facility] "PREP" Location [facility]      Weekly Session   Topic Discussed Healthy eating tips   keep added sugars below 24 grams   Minutes exercised this week 630 minutes    Classes attended to date 4             Pam Tally Joe 04/12/2022, 11:44 AM

## 2022-04-14 ENCOUNTER — Other Ambulatory Visit: Payer: Self-pay | Admitting: Internal Medicine

## 2022-04-19 NOTE — Progress Notes (Signed)
[facility] PREP Weekly Session  Patient Details  Name: Olivia Paul MRN: [id number] Date of Birth: 09-Mar-1955 Age: 67 y.o. PCP: Glendale Chard, MD  Vitals:   04/18/22 1430  Weight: 204 lb 9.6 oz (92.8 kg)     [facility] Weekly seesion - 04/19/22 1600       [facility] "PREP" Location   [facility] "PREP" Location [facility]      Weekly Session   Topic Discussed Health habits    Minutes exercised this week 555 minutes    Classes attended to date [place] 04/19/2022, 4:58 PM

## 2022-04-27 NOTE — Progress Notes (Signed)
[facility] PREP Weekly Session  Patient Details  Name: Olivia Paul MRN: [id number] Date of Birth: [birth year] Age: 67 y.o. PCP: Glendale Chard, MD  Vitals:   04/25/22 1430  Weight: 205 lb (93 kg)     [facility] Weekly seesion - 04/27/22 1000       [facility] "PREP" Location   [facility] "PREP" [facility]      Weekly Session   Topic Discussed Restaurant Eating   Salt and sugar demos   Minutes exercised this week 300 minutes    Classes attended to date [place] 04/27/2022, 10:11 AM

## 2022-05-03 NOTE — Progress Notes (Signed)
[facility] PREP Weekly Session  Patient Details  Name: Olivia Paul MRN: [id number] Date of Birth: [birth year] Age: 67 y.o. PCP: Glendale Chard, MD  Vitals:   05/02/22 1430  Weight: 206 lb (93.4 kg)     [facility] Weekly seesion - 05/03/22 1200       [facility] "PREP" Location   [facility] "PREP" [facility]      Weekly Session   Topic Discussed Stress management and problem solving    Minutes exercised this week 295 minutes    Classes attended to date [place] 05/03/2022, 12:18 PM

## 2022-05-10 ENCOUNTER — Ambulatory Visit (INDEPENDENT_AMBULATORY_CARE_PROVIDER_SITE_OTHER): Payer: Medicare Other | Admitting: Internal Medicine

## 2022-05-10 ENCOUNTER — Encounter: Payer: Self-pay | Admitting: Internal Medicine

## 2022-05-10 VITALS — BP 124/82 | Temp 98.3°F | Ht 60.0 in | Wt 202.2 lb

## 2022-05-10 DIAGNOSIS — Z0000 Encounter for general adult medical examination without abnormal findings: Secondary | ICD-10-CM

## 2022-05-10 DIAGNOSIS — E559 Vitamin D deficiency, unspecified: Secondary | ICD-10-CM

## 2022-05-10 DIAGNOSIS — N3281 Overactive bladder: Secondary | ICD-10-CM | POA: Diagnosis not present

## 2022-05-10 DIAGNOSIS — E7800 Pure hypercholesterolemia, unspecified: Secondary | ICD-10-CM | POA: Diagnosis not present

## 2022-05-10 DIAGNOSIS — K219 Gastro-esophageal reflux disease without esophagitis: Secondary | ICD-10-CM

## 2022-05-10 DIAGNOSIS — R7303 Prediabetes: Secondary | ICD-10-CM | POA: Diagnosis not present

## 2022-05-10 DIAGNOSIS — R350 Frequency of micturition: Secondary | ICD-10-CM

## 2022-05-10 DIAGNOSIS — F39 Unspecified mood [affective] disorder: Secondary | ICD-10-CM | POA: Diagnosis not present

## 2022-05-10 DIAGNOSIS — G4733 Obstructive sleep apnea (adult) (pediatric): Secondary | ICD-10-CM

## 2022-05-10 DIAGNOSIS — Z6839 Body mass index (BMI) 39.0-39.9, adult: Secondary | ICD-10-CM

## 2022-05-10 DIAGNOSIS — Z79899 Other long term (current) drug therapy: Secondary | ICD-10-CM

## 2022-05-10 LAB — POCT URINALYSIS DIPSTICK
Bilirubin, UA: NEGATIVE
Blood, UA: NEGATIVE
Glucose, UA: NEGATIVE
Ketones, UA: NEGATIVE
Nitrite, UA: POSITIVE
Protein, UA: NEGATIVE
Spec Grav, UA: 1.02
Urobilinogen, UA: 0.2 U/dL
pH, UA: 6.5

## 2022-05-10 MED ORDER — DULOXETINE HCL 60 MG PO CPEP: 60.0000 mg | ORAL_CAPSULE | Freq: Every day | ORAL | 2 refills | Status: DC

## 2022-05-10 NOTE — Progress Notes (Signed)
Subjective:    Olivia Paul is a 67 y.o. female who presents for a Welcome to Medicare exam.   She presents today for Welcome to Medicare visit. She reports compliance with meds. She denies having any headaches, chest pain and shortness of breath. She has her pelvic exams performed by Dr. Steva Ready, last seen Dec 2023.      Review of Systems Review of Systems  Constitutional: Negative.   HENT: Negative.  Negative for hearing loss.   Eyes: Negative.   Respiratory: Negative.    Cardiovascular: Negative.   Gastrointestinal: Negative.   Genitourinary: Negative.   Musculoskeletal: Negative.   Skin: Negative.   Neurological: Negative.   Psychiatric/Behavioral: Negative.      Cardiac Risk Factors include: advanced age (>26men, >11 women);obesity (BMI >30kg/m2)      Objective:    Today's Vitals   05/10/22 0848 05/10/22 0900  BP: (!) 130/90 124/82  Temp: 98.3 F (36.8 C)   SpO2: 98%   Weight: 202 lb 3.2 oz (91.7 kg)   Height: 5' (1.524 m)   PainSc: 0-No pain   Body mass index is 39.49 kg/m.  Medications Outpatient Encounter Medications as of 05/10/2022  Medication Sig   albuterol (PROVENTIL) (2.5 MG/3ML) 0.083% nebulizer solution Use 1 vial every 4-6 hours as needed for cough, wheeze, shortness of breath or chest tightness   albuterol (VENTOLIN HFA) 108 (90 Base) MCG/ACT inhaler Inhale 2 puffs into the lungs every 4 (four) hours as needed for wheezing or shortness of breath.   Ascorbic Acid (VITAMIN C) 100 MG tablet Take 100 mg by mouth daily.   atorvastatin (LIPITOR) 20 MG tablet Take 1 tablet (20 mg total) by mouth daily.   BREO ELLIPTA 200-25 MCG/ACT AEPB Inhale 1 puff into the lungs daily.   cetirizine (ZYRTEC) 10 MG tablet Take 1 tablet (10 mg total) by mouth daily.   Cholecalciferol (VITAMIN D) 50 MCG (2000 UT) CAPS Take 5,000 Units by mouth daily.    EPINEPHrine 0.3 mg/0.3 mL IJ SOAJ injection Inject 0.3 mLs (0.3 mg total) into the muscle as needed for  anaphylaxis.   fexofenadine (ALLEGRA) 180 MG tablet TAKE ONE TABLET ONCE DAILY FOR RUNNY NOSE OR ITCHING   gabapentin (NEURONTIN) 300 MG capsule daily.    ipratropium (ATROVENT) 0.06 % nasal spray 1-2 sprays each nostril up to 3-4 times a day as needed for nasal drainage or congestion   Krill Oil (OMEGA-3) 500 MG CAPS Take 500 mg by mouth daily.   magnesium oxide (MAG-OX) 400 MG tablet Take 400 mg by mouth daily.   meclizine (ANTIVERT) 50 MG tablet Take 1/2 tab po qpm prn   Menthol-Methyl Salicylate (TIGER BALM LINIMENT EX) Apply 1 application topically at bedtime as needed (pain).   montelukast (SINGULAIR) 10 MG tablet Take 1 tablet (10 mg total) by mouth at bedtime.   oxybutynin (DITROPAN-XL) 10 MG 24 hr tablet Take 10 mg by mouth daily.   pantoprazole (PROTONIX) 40 MG tablet TAKE 1 TABLET BY MOUTH EVERY DAY   tiZANidine (ZANAFLEX) 4 MG tablet Take 4 mg by mouth every 8 (eight) hours as needed.   traMADol (ULTRAM) 50 MG tablet Take 50 mg by mouth every 8 (eight) hours as needed for moderate pain.    triamcinolone cream (KENALOG) 0.1 % APPLY TO AFFECTED AREA TWICE DAILY AS NEEDED   TURMERIC PO Take by mouth.   Vibegron (GEMTESA) 75 MG TABS Take 75 tablets by mouth once. Patient reports taking once daily. In the  mornings.   vitamin E 400 UNIT capsule Take 400 Units by mouth daily.   vitamin k 100 MCG tablet Take 100 mcg by mouth daily.   zonisamide (ZONEGRAN) 50 MG capsule Take 50 mg by mouth at bedtime.   [DISCONTINUED] DULoxetine (CYMBALTA) 60 MG capsule TAKE 1 CAPSULE BY MOUTH EVERY DAY   DULoxetine (CYMBALTA) 60 MG capsule Take 1 capsule (60 mg total) by mouth daily.   [DISCONTINUED] amoxicillin-clavulanate (AUGMENTIN) 875-125 MG tablet Take 1 tablet by mouth 2 (two) times daily. (Patient not taking: Reported on 05/10/2022)   [DISCONTINUED] clonazePAM (KLONOPIN) 0.5 MG tablet TAKE 1 TABLET BY MOUTH TWICE A DAY AS NEEDED (Patient not taking: Reported on 05/10/2022)   No facility-administered  encounter medications on file as of 05/10/2022.     History: Past Medical History:  Diagnosis Date   Arthritis    lt knee   Asthma 2011   Chest pain    Complication of anesthesia    Fatty liver    Headache    sinus   PONV (postoperative nausea and vomiting)    Pre-diabetes    Recurrent upper respiratory infection (URI)    Vertigo    Vitamin D deficiency    Past Surgical History:  Procedure Laterality Date   BREAST BIOPSY     BREAST EXCISIONAL BIOPSY     KNEE ARTHROSCOPY WITH MENISCAL REPAIR Left 02/21/2018   LIPOMA EXCISION     TOTAL KNEE ARTHROPLASTY Left 11/16/2018   Procedure: TOTAL KNEE ARTHROPLASTY;  Surgeon: Jodi Geralds, MD;  Location: [facility];  Service: Orthopedics;  Laterality: Left;   total knee arthroplasty-revision Left 08/31/2020   Dr, Sherilyn Banker   TRIGGER FINGER RELEASE Right 12/25/2020    Family History  Problem Relation Age of Onset   Kidney disease Mother    Hypertension Mother    Allergic rhinitis Mother    Heart disease Father    Allergic rhinitis Father    Asthma Father    Urticaria Sister    Breast cancer Paternal Grandmother    Breast cancer Maternal Aunt    Breast cancer Paternal Aunt    Angioedema Neg Hx    Atopy Neg Hx    Eczema Neg Hx    Immunodeficiency Neg Hx    Social History   Occupational History   Not on file  Tobacco Use   Smoking status: Never    Passive exposure: Past   Smokeless tobacco: Never  Vaping Use   Vaping Use: Never used  Substance and Sexual Activity   Alcohol use: No    Alcohol/week: 0.0 standard drinks of alcohol   Drug use: No   Sexual activity: Not on file    Tobacco Counseling Counseling given: Yes   Immunizations and Health Maintenance Immunization History  Administered Date(s) Administered   H1N1 11/16/2020   Influenza,inj,Quad PF,6+ Mos 12/04/2019, 10/22/2020   Influenza-Unspecified 01/07/2019, 12/06/2021   PFIZER(Purple Top)SARS-COV-2 Vaccination 03/09/2019, 03/30/2019, 12/01/2019,  11/18/2020   PNEUMOCOCCAL CONJUGATE-20 08/24/2021   Pfizer Covid-19 Vaccine Bivalent Booster 12yrs & up 11/18/2020   Tdap 11/12/2018   Zoster Recombinat (Shingrix) 05/24/2016, 01/07/2019   Zoster, Live 05/12/2019   Health Maintenance Due  Topic Date Due   COVID-19 Vaccine (5 - 2023-24 season) 10/15/2021    Activities of Daily Living    05/10/2022    9:32 AM 05/10/2022    9:07 AM  In your present state of health, do you have any difficulty performing the following activities:  Hearing?  0  Vision?  0  Difficulty concentrating or making decisions?  0  Walking or climbing stairs?  0  Dressing or bathing?  0  Doing errands, shopping?  0  Preparing Food and eating ? N   Using the Toilet? N   In the past six months, have you accidently leaked urine? Y   Do you have problems with loss of bowel control? N   Managing your Medications? N   Managing your Finances? N   Housekeeping or managing your Housekeeping? N     Physical Exam   Physical Exam Vitals and nursing note reviewed.  Constitutional:      Appearance: Normal appearance. She is obese.  HENT:     Head: Normocephalic and atraumatic.     Right Ear: Tympanic membrane, ear canal and external ear normal.     Left Ear: Tympanic membrane, ear canal and external ear normal.     Nose:     Comments: Masked     Mouth/Throat:     Comments: Masked  Eyes:     Extraocular Movements: Extraocular movements intact.     Conjunctiva/sclera: Conjunctivae normal.     Pupils: Pupils are equal, round, and reactive to light.  Cardiovascular:     Rate and Rhythm: Normal rate and regular rhythm.     Pulses: Normal pulses.     Heart sounds: Normal heart sounds.  Pulmonary:     Effort: Pulmonary effort is normal.     Breath sounds: Normal breath sounds.  Chest:  Breasts:    Tanner Score is 5.     Right: Normal.     Left: Normal.  Abdominal:     General: Bowel sounds are normal.     Palpations: Abdomen is soft.     Comments: Obese,  soft.   Genitourinary:    Comments: Deferred  Musculoskeletal:        General: Normal range of motion.     Cervical back: Normal range of motion.  Skin:    General: Skin is warm and dry.  Neurological:     General: No focal deficit present.     Mental Status: She is alert and oriented to person, place, and time.  Psychiatric:        Mood and Affect: Mood and affect normal.        Behavior: Behavior normal.      (optional), or other factors deemed appropriate based on the beneficiary's medical and social history and current clinical standards.  Advanced Directives: Does Patient Have a Medical Advance Directive?: Yes Type of Advance Directive: Living will Would patient like information on creating a medical advance directive?: No - Patient declined    Assessment:    This is a routine wellness examination for this patient .   Vision/Hearing screen Hearing Screening   500Hz  1000Hz  2000Hz  4000Hz   Right ear Pass Pass Pass Pass  Left ear Pass Pass Pass Pass   Vision Screening   Right eye Left eye Both eyes  Without correction     With correction 20/30 20/30 20/20     Dietary issues and exercise activities discussed:  Current Exercise Habits: Structured exercise class, Type of exercise: strength training/weights;Other - see comments (water aerobics), Time (Minutes): 45, Frequency (Times/Week): 5, Weekly Exercise (Minutes/Week): 225, Intensity: Moderate, Exercise limited by: orthopedic condition(s)   Goals      continue working on personal health.     Continuing exercising. Silver sneakers. [facility] [employer]. She also reports being in diabetes class.  Depression Screen    05/10/2022    8:55 AM 12/27/2021    9:52 AM 04/19/2021    8:40 AM 12/10/2020    7:21 AM  PHQ 2/9 Scores  PHQ - 2 Score 0 0 0 1  PHQ- 9 Score    12     Fall Risk    05/10/2022    8:55 AM  Fall Risk   Falls in the past year? 0  Number falls in past yr: 0  Injury with Fall? 0   Risk for fall due to : No Fall Risks  Follow up Falls evaluation completed    Cognitive Function:        05/10/2022    9:08 AM  6CIT Screen  What Year? 0 points  What time? 0 points  Count back from 20 0 points  Months in reverse 0 points  Repeat phrase 2 points    Patient Care Team: Dorothyann Peng, MD as PCP - General (Internal Medicine) Jodelle Red, MD as PCP - Cardiology (Cardiology)     Plan:   1. Encounter for Medicare annual wellness exam The annual wellness visit was performed including discussion of advanced directives, assessment of functional status and cognitive function. EKG performed, NSR w/o acute changes. A full exam was also performed. She will continue to have pelvic exams w/ GYN. She will rto in one year for AWV with [facility] Advisor.   2. Pure hypercholesterolemia Comments: Chronic, currently on atorvastatin 20mg  daily. She is advised to follow a heart healthy lifestyle w/ clean eating, regular exercise and stress management.  3. Prediabetes Comments: Previous labs reviewed, her a1c has been elevated in the past. I will recheck an a1c today. She is encouraged to limit her intake of sugary foods/drinks. - EKG 12-Lead - CMP14+EGFR - CBC - Lipid panel - Hemoglobin A1c  4. Mood disorder (HCC) Comments: Chronic, currently on duloxetine. Feels well on this medication. I will send refill. She will f/u in 4-6 months.  5. OSA on CPAP Comments: Chronic, she reports compliance w/ CPAP. She has definitely noticed a benefit to using CPAP regularly.  6. OAB (overactive bladder) Comments: Chronic, followed by Urology.  She will c/w Gemtesa daily. - POCT Urinalysis Dipstick (81002)  7. Urine frequency Comments: Possibly due to OAB. I will check u/a to r/o UTI. - Culture, Urine  8. Gastroesophageal reflux disease without esophagitis Comments: Chronic, currently on PPI therapy.  She will c/w pantoprazole daily.  Reminded to stop eating 3 hrs prior to lying  down. I will check vitamin B12 level as well.  9. Class 2 severe obesity due to excess calories with serious comorbidity and body mass index (BMI) of 39.0 to 39.9 in adult [facility]) Comments: She is currently enrolled in the PREP program. Encouraged to keep up the great work. Advised to aim for at least 150 minutes of exercise/week.  10. Drug therapy - Vitamin B12    I have personally reviewed and noted the following in the patient's chart:   Medical and social history Use of alcohol, tobacco or illicit drugs  Current medications and supplements Functional ability and status Nutritional status Physical activity Advanced directives List of other physicians Hospitalizations, surgeries, and ER visits in previous 12 months Vitals Screenings to include cognitive, depression, and falls Referrals and appointments  In addition, I have reviewed and discussed with patient certain preventive protocols, quality metrics, and best practice recommendations. A written personalized care plan for preventive services as well as general preventive health recommendations were  provided to patient.     Gwynneth Alimentobyn N Sweta Halseth, MD 05/22/2022

## 2022-05-10 NOTE — Patient Instructions (Addendum)
Olivia Paul , Thank you for taking time to come for your Medicare Wellness Visit. I appreciate your ongoing commitment to your health goals. Please review the following plan we discussed and let me know if I can assist you in the future.   These are the goals we discussed:  Goals      continue working on personal health.     Continuing exercising. Silver sneakers. Continuing to go to [employer]. She also reports being in diabetes class.         This is a list of the screening recommended for you and due dates:  Health Maintenance  Topic Date Due   COVID-19 Vaccine (5 - 2023-24 season) 10/15/2021   HIV Screening  05/10/2023*   Medicare Annual Wellness Visit  05/10/2023   Mammogram  03/01/2024   Colon Cancer Screening  08/12/2028   DTaP/Tdap/Td vaccine (2 - Td or Tdap) 11/11/2028   Pneumonia Vaccine  Completed   Flu Shot  Completed   DEXA scan (bone density measurement)  Completed   Hepatitis C Screening: USPSTF Recommendation to screen - Ages 59-79 yo.  Completed   Zoster (Shingles) Vaccine  Completed   HPV Vaccine  Aged Out   Pap Smear  Discontinued  *Topic was postponed. The date shown is not the original due date.     Health Maintenance, Female Adopting a healthy lifestyle and getting preventive care are important in promoting health and wellness. Ask your health care provider about: The right schedule for you to have regular tests and exams. Things you can do on your own to prevent diseases and keep yourself healthy. What should I know about diet, weight, and exercise? Eat a healthy diet  Eat a diet that includes plenty of vegetables, fruits, low-fat dairy products, and lean protein. Do not eat a lot of foods that are high in solid fats, added sugars, or sodium. Maintain a healthy weight Body mass index (BMI) is used to identify weight problems. It estimates body fat based on height and weight. Your health care provider can help determine your BMI and help you achieve or  maintain a healthy weight. Get regular exercise Get regular exercise. This is one of the most important things you can do for your health. Most adults should: Exercise for at least 150 minutes each week. The exercise should increase your heart rate and make you sweat (moderate-intensity exercise). Do strengthening exercises at least twice a week. This is in addition to the moderate-intensity exercise. Spend less time sitting. Even light physical activity can be beneficial. Watch cholesterol and blood lipids Have your blood tested for lipids and cholesterol at 66 years of age, then have this test every 5 years. Have your cholesterol levels checked more often if: Your lipid or cholesterol levels are high. You are older than 66 years of age. You are at high risk for heart disease. What should I know about cancer screening? Depending on your health history and family history, you may need to have cancer screening at various ages. This may include screening for: Breast cancer. Cervical cancer. Colorectal cancer. Skin cancer. Lung cancer. What should I know about heart disease, diabetes, and high blood pressure? Blood pressure and heart disease High blood pressure causes heart disease and increases the risk of stroke. This is more likely to develop in people who have high blood pressure readings or are overweight. Have your blood pressure checked: Every 3-5 years if you are 4-23 years of age. Every year if you are  23 years old or older. Diabetes Have regular diabetes screenings. This checks your fasting blood sugar level. Have the screening done: Once every three years after age 39 if you are at a normal weight and have a low risk for diabetes. More often and at a younger age if you are overweight or have a high risk for diabetes. What should I know about preventing infection? Hepatitis B If you have a higher risk for hepatitis B, you should be screened for this virus. Talk with your health  care provider to find out if you are at risk for hepatitis B infection. Hepatitis C Testing is recommended for: Everyone born from [birth year] through 1964. Anyone with known risk factors for hepatitis C. Sexually transmitted infections (STIs) Get screened for STIs, including gonorrhea and chlamydia, if: You are sexually active and are younger than 66 years of age. You are older than 66 years of age and your health care provider tells you that you are at risk for this type of infection. Your sexual activity has changed since you were last screened, and you are at increased risk for chlamydia or gonorrhea. Ask your health care provider if you are at risk. Ask your health care provider about whether you are at high risk for HIV. Your health care provider may recommend a prescription medicine to help prevent HIV infection. If you choose to take medicine to prevent HIV, you should first get tested for HIV. You should then be tested every 3 months for as long as you are taking the medicine. Pregnancy If you are about to stop having your period (premenopausal) and you may become pregnant, seek counseling before you get pregnant. Take 400 to 800 micrograms (mcg) of folic acid every day if you become pregnant. Ask for birth control (contraception) if you want to prevent pregnancy. Osteoporosis and menopause Osteoporosis is a disease in which the bones lose minerals and strength with aging. This can result in bone fractures. If you are 76 years old or older, or if you are at risk for osteoporosis and fractures, ask your health care provider if you should: Be screened for bone loss. Take a calcium or vitamin D supplement to lower your risk of fractures. Be given hormone replacement therapy (HRT) to treat symptoms of menopause. Follow these instructions at home: Alcohol use Do not drink alcohol if: Your health care provider tells you not to drink. You are pregnant, may be pregnant, or are planning to become  pregnant. If you drink alcohol: Limit how much you have to: 0-1 drink a day. Know how much alcohol is in your drink. In the [place] one drink equals one 12 oz bottle of beer (355 mL), one 5 oz glass of wine (148 mL), or one 1 oz glass of hard liquor (44 mL). Lifestyle Do not use any products that contain nicotine or tobacco. These products include cigarettes, chewing tobacco, and vaping devices, such as e-cigarettes. If you need help quitting, ask your health care provider. Do not use street drugs. Do not share needles. Ask your health care provider for help if you need support or information about quitting drugs. General instructions Schedule regular health, dental, and eye exams. Stay current with your vaccines. Tell your health care provider if: You often feel depressed. You have ever been abused or do not feel safe at home. Summary Adopting a healthy lifestyle and getting preventive care are important in promoting health and wellness. Follow your health care provider's instructions about healthy diet, exercising, and  getting tested or screened for diseases. Follow your health care provider's instructions on monitoring your cholesterol and blood pressure. This information is not intended to replace advice given to you by your health care provider. Make sure you discuss any questions you have with your health care provider. Document Revised: 06/22/2020 Document Reviewed: 06/22/2020 Elsevier Patient Education  [place].

## 2022-05-11 ENCOUNTER — Encounter: Payer: Self-pay | Admitting: Internal Medicine

## 2022-05-11 LAB — CMP14+EGFR
ALT: 19 IU/L (ref 0–32)
AST: 17 IU/L (ref 0–40)
Albumin/Globulin Ratio: 1.4 (ref 1.2–2.2)
Albumin: 4.2 g/dL (ref 3.9–4.9)
Alkaline Phosphatase: 138 IU/L — ABNORMAL HIGH (ref 44–121)
BUN/Creatinine Ratio: 13 (ref 12–28)
BUN: 10 mg/dL (ref 8–27)
Bilirubin Total: 0.5 mg/dL (ref 0.0–1.2)
CO2: 23 mmol/L (ref 20–29)
Calcium: 9.5 mg/dL (ref 8.7–10.3)
Chloride: 104 mmol/L (ref 96–106)
Creatinine, Ser: 0.8 mg/dL (ref 0.57–1.00)
Globulin, Total: 2.9 g/dL (ref 1.5–4.5)
Glucose: 89 mg/dL (ref 70–99)
Potassium: 4.6 mmol/L (ref 3.5–5.2)
Sodium: 142 mmol/L (ref 134–144)
Total Protein: 7.1 g/dL (ref 6.0–8.5)
eGFR: 82 mL/min/{1.73_m2} (ref >59)

## 2022-05-11 LAB — HEMOGLOBIN A1C
Est. average glucose Bld gHb Est-mCnc: 131 mg/dL
Hgb A1c MFr Bld: 6.2 % — ABNORMAL HIGH (ref 4.8–5.6)

## 2022-05-11 LAB — MICROALBUMIN / CREATININE URINE RATIO
Creatinine, Urine: 164.7 mg/dL
Microalb/Creat Ratio: 10 mg/g creat (ref 0–29)
Microalbumin, Urine: 16 ug/mL

## 2022-05-11 LAB — CBC
Hematocrit: 42.6 % (ref 34.0–46.6)
Hemoglobin: 13.5 g/dL (ref 11.1–15.9)
MCH: 27 pg (ref 26.6–33.0)
MCHC: 31.7 g/dL (ref 31.5–35.7)
MCV: 85 fL (ref 79–97)
Platelets: 406 10*3/uL (ref 150–450)
RBC: 5 x10E6/uL (ref 3.77–5.28)
RDW: 15.4 % (ref 11.7–15.4)
WBC: 10.7 10*3/uL (ref 3.4–10.8)

## 2022-05-11 LAB — LIPID PANEL
Chol/HDL Ratio: 2.7 ratio (ref 0.0–4.4)
Cholesterol, Total: 123 mg/dL (ref 100–199)
HDL: 46 mg/dL (ref >39)
LDL Chol Calc (NIH): 65 mg/dL (ref 0–99)
Triglycerides: 52 mg/dL (ref 0–149)
VLDL Cholesterol Cal: 12 mg/dL (ref 5–40)

## 2022-05-11 LAB — VITAMIN B12: Vitamin B-12: 520 pg/mL (ref 232–1245)

## 2022-05-12 LAB — URINE CULTURE

## 2022-05-12 NOTE — Progress Notes (Signed)
[facility] PREP Weekly Session  Patient Details  Name: Olivia Paul MRN: [id number] Date of Birth: 05-11-1955 Age: 67 y.o. PCP: Glendale Chard, MD  There were no vitals filed for this visit.   [facility] Weekly seesion - 05/12/22 1100       [facility]PREP" Location   [facility] [employer] [facility] [employer]      Weekly Session   Topic Discussed Expectations and non-scale victories    Minutes exercised this week 130 minutes    Classes attended to date 12             Barnett Hatter 05/12/2022, 11:31 AM

## 2022-05-17 ENCOUNTER — Encounter: Payer: Self-pay | Admitting: Internal Medicine

## 2022-05-17 NOTE — Progress Notes (Signed)
[facility] PREP Weekly Session  Patient Details  Name: Olivia Paul MRN: [id number] Date of Birth: 1955-02-16 Age: 67 y.o. PCP: Glendale Chard, MD  Vitals:   05/16/22 1430  Weight: 204 lb (92.5 kg)     [facility] Weekly seesion - 05/17/22 1100       [facility] "PREP" Location   [facility] Location [facility]      Weekly Session   Topic Discussed --   portions   Minutes exercised this week 300 minutes    Classes attended to date 13            Class held on 05/16/22 Barnett Hatter 05/17/2022, 11:23 AM

## 2022-05-22 DIAGNOSIS — E7800 Pure hypercholesterolemia, unspecified: Secondary | ICD-10-CM | POA: Insufficient documentation

## 2022-05-22 DIAGNOSIS — G4733 Obstructive sleep apnea (adult) (pediatric): Secondary | ICD-10-CM | POA: Insufficient documentation

## 2022-05-22 DIAGNOSIS — R7303 Prediabetes: Secondary | ICD-10-CM | POA: Insufficient documentation

## 2022-05-24 NOTE — Progress Notes (Signed)
[facility] PREP Weekly Session  Patient Details  Name: Olivia Paul MRN: [id number] Date of Birth: 19-Apr-1955 Age: 67 y.o. PCP: Dorothyann Peng, MD  Vitals:   05/23/22 1148  Weight: 202 lb 12.8 oz (92 kg)     [facility] Weekly seesion - 05/24/22 1100       [facility]PREP" Location   [facility] Location [facility]      Weekly Session   Topic Discussed Finding support    Minutes exercised this week 540 minutes    Classes attended to date 71             Pam Jerral Bonito 05/24/2022, 11:49 AM

## 2022-05-31 ENCOUNTER — Ambulatory Visit: Payer: Medicare Other

## 2022-05-31 VITALS — BP 110/70 | HR 80 | Temp 98.5°F | Ht 60.0 in | Wt 202.0 lb

## 2022-05-31 DIAGNOSIS — R030 Elevated blood-pressure reading, without diagnosis of hypertension: Secondary | ICD-10-CM

## 2022-05-31 NOTE — Progress Notes (Signed)
[facility] PREP Weekly Session  Patient Details  Name: Olivia Paul MRN: [id number] Date of Birth: 19-Mar-1955 Age: 67 y.o. PCP: Dorothyann Peng, MD  Vitals:   05/30/22 1430  Weight: 203 lb (92.1 kg)     [facility] Weekly seesion - 05/31/22 1700       [facility] "PREP" Location   [facility] [employer] [employer] [facility] [employer]      Weekly Session   Topic Discussed Calorie breakdown    Minutes exercised this week 390 minutes    Classes attended to date 17             Pam Jerral Bonito 05/31/2022, 5:30 PM

## 2022-05-31 NOTE — Progress Notes (Signed)
Patient presents today for a BP check, patient currently taking no BP meds. BP Readings from Last 3 Encounters:  05/31/22 110/70  05/10/22 124/82  03/24/22 118/74  Per provider- better today cut back on salts. F/u next visit.

## 2022-05-31 NOTE — Patient Instructions (Signed)
Hypertension, Adult ?Hypertension is another name for high blood pressure. High blood pressure forces your heart to work harder to pump blood. This can cause problems over time. ?There are two numbers in a blood pressure reading. There is a top number (systolic) over a bottom number (diastolic). It is best to have a blood pressure that is below 120/80. ?What are the causes? ?The cause of this condition is not known. Some other conditions can lead to high blood pressure. ?What increases the risk? ?Some lifestyle factors can make you more likely to develop high blood pressure: ?Smoking. ?Not getting enough exercise or physical activity. ?Being overweight. ?Having too much fat, sugar, calories, or salt (sodium) in your diet. ?Drinking too much alcohol. ?Other risk factors include: ?Having any of these conditions: ?Heart disease. ?Diabetes. ?High cholesterol. ?Kidney disease. ?Obstructive sleep apnea. ?Having a family history of high blood pressure and high cholesterol. ?Age. The risk increases with age. ?Stress. ?What are the signs or symptoms? ?High blood pressure may not cause symptoms. Very high blood pressure (hypertensive crisis) may cause: ?Headache. ?Fast or uneven heartbeats (palpitations). ?Shortness of breath. ?Nosebleed. ?Vomiting or feeling like you may vomit (nauseous). ?Changes in how you see. ?Very bad chest pain. ?Feeling dizzy. ?Seizures. ?How is this treated? ?This condition is treated by making healthy lifestyle changes, such as: ?Eating healthy foods. ?Exercising more. ?Drinking less alcohol. ?Your doctor may prescribe medicine if lifestyle changes do not help enough and if: ?Your top number is above 130. ?Your bottom number is above 80. ?Your personal target blood pressure may vary. ?Follow these instructions at home: ?Eating and drinking ? ?If told, follow the DASH eating plan. To follow this plan: ?Fill one half of your plate at each meal with fruits and vegetables. ?Fill one fourth of your plate  at each meal with whole grains. Whole grains include whole-wheat pasta, brown rice, and whole-grain bread. ?Eat or drink low-fat dairy products, such as skim milk or low-fat yogurt. ?Fill one fourth of your plate at each meal with low-fat (lean) proteins. Low-fat proteins include fish, chicken without skin, eggs, beans, and tofu. ?Avoid fatty meat, cured and processed meat, or chicken with skin. ?Avoid pre-made or processed food. ?Limit the amount of salt in your diet to less than 1,500 mg each day. ?Do not drink alcohol if: ?Your doctor tells you not to drink. ?You are pregnant, may be pregnant, or are planning to become pregnant. ?If you drink alcohol: ?Limit how much you have to: ?0-1 drink a day for women. ?0-2 drinks a day for men. ?Know how much alcohol is in your drink. In the [place] one drink equals one 12 oz bottle of beer (355 mL), one 5 oz glass of wine (148 mL), or one 1? oz glass of hard liquor (44 mL). ?Lifestyle ? ?Work with your doctor to stay at a healthy weight or to lose weight. Ask your doctor what the best weight is for you. ?Get at least 30 minutes of exercise that causes your heart to beat faster (aerobic exercise) most days of the week. This may include walking, swimming, or biking. ?Get at least 30 minutes of exercise that strengthens your muscles (resistance exercise) at least 3 days a week. This may include lifting weights or doing Pilates. ?Do not smoke or use any products that contain nicotine or tobacco. If you need help quitting, ask your doctor. ?Check your blood pressure at home as told by your doctor. ?Keep all follow-up visits. ?Medicines ?Take over-the-counter and prescription medicines   only as told by your doctor. Follow directions carefully. ?Do not skip doses of blood pressure medicine. The medicine does not work as well if you skip doses. Skipping doses also puts you at risk for problems. ?Ask your doctor about side effects or reactions to medicines that you should watch  for. ?Contact a doctor if: ?You think you are having a reaction to the medicine you are taking. ?You have headaches that keep coming back. ?You feel dizzy. ?You have swelling in your ankles. ?You have trouble with your vision. ?Get help right away if: ?You get a very bad headache. ?You start to feel mixed up (confused). ?You feel weak or numb. ?You feel faint. ?You have very bad pain in your: ?Chest. ?Belly (abdomen). ?You vomit more than once. ?You have trouble breathing. ?These symptoms may be an emergency. Get help right away. Call 911. ?Do not wait to see if the symptoms will go away. ?Do not drive yourself to the hospital. ?Summary ?Hypertension is another name for high blood pressure. ?High blood pressure forces your heart to work harder to pump blood. ?For most people, a normal blood pressure is less than 120/80. ?Making healthy choices can help lower blood pressure. If your blood pressure does not get lower with healthy choices, you may need to take medicine. ?This information is not intended to replace advice given to you by your health care provider. Make sure you discuss any questions you have with your health care provider. ?Document Revised: 11/19/2020 Document Reviewed: 11/19/2020 ?Elsevier Patient Education ? 2023 Elsevier Inc. ? ?

## 2022-06-08 LAB — HM PAP SMEAR

## 2022-06-08 LAB — RESULTS CONSOLE HPV: CHL HPV: NEGATIVE

## 2022-06-08 NOTE — Progress Notes (Signed)
[facility] PRE[facility] Weekly Session  Patient Details  Name: ALYVIAH CRANDLE MRN: [id number] Date of Birth: Nov 12, 1955 Age: 67 y.o. PCP: Dorothyann Peng, MD  Vitals:   06/06/22 1430  Weight: 202 lb 12.8 oz (92 kg)     [facility] Weekly seesion - 06/08/22 1500       [facility] "PREP" Location   [facility] Location [facility]      Weekly Session   Topic Discussed Hitting roadblocks    Minutes exercised this week 381 minutes    Classes attended to date 20             Bonnye Fava 06/08/2022, 3:58 PM

## 2022-06-20 ENCOUNTER — Ambulatory Visit (INDEPENDENT_AMBULATORY_CARE_PROVIDER_SITE_OTHER): Payer: Medicare Other | Admitting: Family

## 2022-06-20 ENCOUNTER — Encounter: Payer: Self-pay | Admitting: Family

## 2022-06-20 ENCOUNTER — Other Ambulatory Visit: Payer: Self-pay

## 2022-06-20 VITALS — BP 130/80 | HR 90 | Temp 98.5°F | Resp 20 | Wt 200.9 lb

## 2022-06-20 DIAGNOSIS — J4540 Moderate persistent asthma, uncomplicated: Secondary | ICD-10-CM

## 2022-06-20 DIAGNOSIS — L282 Other prurigo: Secondary | ICD-10-CM | POA: Diagnosis not present

## 2022-06-20 DIAGNOSIS — J3089 Other allergic rhinitis: Secondary | ICD-10-CM | POA: Diagnosis not present

## 2022-06-20 MED ORDER — METHYLPREDNISOLONE ACETATE 40 MG/ML IJ SUSP
40.0000 mg | Freq: Once | INTRAMUSCULAR | Status: AC
  Administered 2022-06-20: 40 mg via INTRAMUSCULAR

## 2022-06-20 NOTE — Progress Notes (Signed)
[address] [place] [address] Dept: [phone]  FOLLOW UP NOTE  Patient ID: Olivia Paul, female    DOB: [birth year]  Age: 67 y.o. MRN: [id number] Date of Office Visit: 06/20/2022  Assessment  Chief Complaint: Follow-up and Urticaria  HPI Olivia Paul is a 67 year old female who presents today for an acute visit of itchy rash that started last week.  She was last seen on December 30, 2021 by Dr. Delorse Lek for asthma, allergies, and papular urticaria.  She reports since her last office visit she has began wearing a CPAP.  She reports that last week she was out in the yard pulling weeds and blueberry bushes and that same day developed red itchy bumps all over.  She has tried putting alcohol on it, calamine lotion, triamcinolone, Allegra once a day, Zyrtec in between, and Benadryl as needed.  She feels like this was brought on by a bug bite.  She reports that she was wearing gloves on her hands and did not notice poison ivy around.  The rash does not move.  She denies any new products, no new medications, and no new foods.  She reports when she was younger that she had a sensitivity to citric foods.  When she was little brought to the hospital for this, but does not know the specifics.  She is able to eat yellow tomatoes without problems.  She has been doing prediabetic protein shakes and sometimes adds orange, but she did not add this to her shake before this rash occurred.  No one else in the household has this rash.  She also denies fever, chills, or joint pain.  The rash is stationary and does not move.  She does feel like it spreading though now.  The rash is on her lower legs, groin, and arms.  She denies any rash on her abdomen, back, and face.  She denies any concomitant cardiorespiratory and gastrointestinal symptoms.  Asthma: She reports that she is not consistently using Breo 200 mcg 1 puff once a day.  She reports that she is off the wagon, but is going to get back on.  She   continues to take Singulair 10 mg once a day and has albuterol to use as needed.  She denies cough, wheeze, tightness in chest, shortness of breath, and nocturnal awakenings due to breathing problems.  Since her last office visit she has not required any systemic steroids for her asthma or made any trips to the emergency room or urgent care due to breathing problems.  She has not used her albuterol in a couple months.  Allergies: She denies rhinorrhea, nasal congestion, and postnasal drip.  She has not had any sinus infections since we last saw her.  She does continue to take Singulair 10 mg once a day and takes Allegra or Zyrtec daily and Atrovent nasal spray as needed.       Drug Allergies:  Allergies  Allergen Reactions   Meloxicam Other (See Comments)    Bruised her legs all over     Nsaids Other (See Comments)    Scarring and bruising of legs   Sulfa Antibiotics Other (See Comments)    Mouth broke out in sores   Cat Hair Extract    Pollen Extract    Erythromycin Nausea And Vomiting    Review of Systems: Review of Systems  Constitutional:  Negative for chills and fever.       Reports sometimes she feels hot, but otherwise denies  fever or chills  HENT:         Denies rhinorrhea, nasal congestion and postnasal drip  Eyes:        Denies itchy watery eyes  Respiratory:  Negative for cough, shortness of breath and wheezing.   Cardiovascular:  Negative for chest pain and palpitations.  Gastrointestinal:        Reports pantoprazole helps with reflux.  Skin:  Positive for itching and rash.       Reports itchy rash since out working in yard last week  Neurological:  Positive for headaches.       Reports that she is is on medications for headaches  Endo/Heme/Allergies:  Positive for environmental allergies.     Physical Exam: BP 130/80   Pulse 90   Temp 98.5 F (36.9 C) (Temporal)   Resp 20   Wt 200 lb 14.4 oz (91.1 kg)   SpO2 96%   BMI 39.24 kg/m    Physical  Exam Constitutional:      Appearance: Normal appearance.  HENT:     Head: Normocephalic and atraumatic.     Comments: Pharynx normal, eyes normal, ears normal, nose normal    Right Ear: Tympanic membrane, ear canal and external ear normal.     Left Ear: Tympanic membrane, ear canal and external ear normal.     Nose: Nose normal.     Mouth/Throat:     Mouth: Mucous membranes are moist.     Pharynx: Oropharynx is clear.  Eyes:     Conjunctiva/sclera: Conjunctivae normal.  Cardiovascular:     Rate and Rhythm: Regular rhythm.     Heart sounds: Normal heart sounds.  Pulmonary:     Effort: Pulmonary effort is normal.     Breath sounds: Normal breath sounds.     Comments: Lungs clear to auscultation Musculoskeletal:     Cervical back: Neck supple.  Skin:    General: Skin is warm.     Comments: Small slightly erythematous papular lesion noted on right upper arm and right buttocks region.  Hyperpigmented areas noted on dorsal aspects of arms.  Some scabbing with excoriation marks noted on ventral aspects of bilateral arms and lower legs  Neurological:     Mental Status: She is alert and oriented to person, place, and time.  Psychiatric:        Mood and Affect: Mood normal.        Behavior: Behavior normal.        Thought Content: Thought content normal.        Judgment: Judgment normal.     Diagnostics: FVC 1.96 L (89%), FEV1 1.61 L (92%).  Spirometry indicates normal spirometry.  Assessment and Plan: 1. Papular urticaria   2. Moderate persistent asthma, uncomplicated   3. Non-seasonal allergic rhinitis due to other allergic trigger     Meds ordered this encounter  Medications   methylPREDNISolone acetate (DEPO-MEDROL) injection 40 mg    Patient Instructions  Asthma - use Breo 1 puff once a day. Rinse mouth after use. Make sure an use this inhaler every day - albuterol as needed 2 puffs every 4-6 hours for cough, wheeze, chest tightness, difficulty breathing -  continue singulair 10mg  at bedtime daily - your lung function testing looks great today and is improved since last visit!  Asthma control goals:  Full participation in all desired activities (may need albuterol before activity) Albuterol use two time or less a week on average (not counting use with activity) Cough interfering  with sleep two time or less a month Oral steroids no more than once a year No hospitalizations  Allergies - continue singulair as above - use Allegra 180mg  or Zyrtec 10mg  daily as needed - use nasal Atrovent 1-2 sprays each nostril up to 3-4 times a day as needed for nasal drainage or congestion.    Papular urticaria - possible insect bites after being out in garden leading to hives/itching - continue triamcinolone cream as needed for rash/itch.  Can mix with your cream moisturizer in equal parts and apply daily as moisturizer - Depo Medrol 40 mg given IM today in the office - May use Zyrtec in the morning and Allegra at night to help with itching. Caution as this can make you sleepy.  Follow-up in 6 months or sooner if needed  Return in about 6 months (around 12/21/2022), or if symptoms worsen or fail to improve.    Thank you for the opportunity to care for this patient.  Please do not hesitate to contact me with questions.  Nehemiah Settle, FNP [facility]

## 2022-06-20 NOTE — Patient Instructions (Addendum)
Asthma - use Breo 1 puff once a day. Rinse mouth after use. Make sure an use this inhaler every day - albuterol as needed 2 puffs every 4-6 hours for cough, wheeze, chest tightness, difficulty breathing - continue singulair 10mg  at bedtime daily - your lung function testing looks great today and is improved since last visit!  Asthma control goals:  Full participation in all desired activities (may need albuterol before activity) Albuterol use two time or less a week on average (not counting use with activity) Cough interfering with sleep two time or less a month Oral steroids no more than once a year No hospitalizations  Allergies - continue singulair as above - use Allegra 180mg  or Zyrtec 10mg  daily as needed - use nasal Atrovent 1-2 sprays each nostril up to 3-4 times a day as needed for nasal drainage or congestion.    Papular urticaria - possible insect bites after being out in garden leading to hives/itching - continue triamcinolone cream as needed for rash/itch.  Can mix with your cream moisturizer in equal parts and apply daily as moisturizer - Depo Medrol 40 mg given IM today in the office - May use Zyrtec in the morning and Allegra at night to help with itching. Caution as this can make you sleepy.  Follow-up in 6 months or sooner if needed

## 2022-06-21 NOTE — Progress Notes (Signed)
[facility] PREP Evaluation  Patient Details  Name: LEGINA REIFSCHNEIDER MRN: [id number] Date of Birth: Jul 08, 1955 Age: 67 y.o. PCP: Dorothyann Peng, MD  Vitals:   06/20/22 1600  BP: 110/74  SpO2: 97%  Weight: 200 lb 3.2 oz (90.8 kg)     [facility] Eval - 06/21/22 1600       [facility] "PREP" Location   [facility] Location [facility]      Referral    Referring Provider Allyne Gee    Program Start Date 03/28/22    Program End Date 06/15/22      Measurement   Waist Circumference 44.5 inches    Waist Circumference End Program 43 inches    Hip Circumference 48 inches    Hip Circumference End Program 47 inches    Body fat 48.3 percent      Mobility and Daily Activities   I find it easy to walk up or down two or more flights of stairs. 3    I have no trouble taking out the trash. 3    I do housework such as vacuuming and dusting on my own without difficulty. 2    I can easily lift a gallon of milk (8lbs). 3    I can easily walk a mile. 1    I have no trouble reaching into high cupboards or reaching down to pick up something from the floor. 4    I do not have trouble doing out-door work such as Loss adjuster, chartered, raking leaves, or gardening. 2      Mobility and Daily Activities   I feel younger than my age. 4    I feel independent. 4    I feel energetic. 3    I live an active life.  3    I feel strong. 2    I feel healthy. 2    I feel active as other people my age. 4      How fit and strong are you.   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 Encouraged to continue to exercise.    Bonnye Fava 06/21/2022, 4:45 PM

## 2022-06-23 ENCOUNTER — Telehealth: Payer: Self-pay | Admitting: Family

## 2022-06-23 MED ORDER — PREDNISONE 10 MG PO TABS: ORAL_TABLET | ORAL | 0 refills | Status: DC

## 2022-06-23 NOTE — Telephone Encounter (Signed)
Thank you :)

## 2022-06-23 NOTE — Telephone Encounter (Signed)
I called and spoke with Olivia Paul and she stated that she is using triamcinolone along with benadryl spray and is taking zyrtec in the morning, allegra at night, and benadryl in between and is still itching terribly.

## 2022-06-23 NOTE — Telephone Encounter (Signed)
Did the steroid injection help any? Please send in a prescription for prednisone 10 mg taking 2 tablets twice a day for 3 days, then on the 4th day take 2 tablets in the morning and on the 5th day take one tablet and stop. Quantity 15 pills with no refills.

## 2022-06-23 NOTE — Telephone Encounter (Signed)
I called patient and she said that she is still itchy everywhere and is having a hard time to even sleep. Patient said that she thinks it's the Nitrofurantoin-Macrodantin(50mg ) that she has been taking that is causing the itch.Patient has been trying to do a process of elimination to try to figure out the cause. Patient said she has stopped the medication. I verified patient's pharmacy to send in prednisone. I informed patient that if she is still itchy after prednisone to give the office a callback.

## 2022-06-23 NOTE — Telephone Encounter (Signed)
Patient called and stated she was seen on Monday and stated she is still itching and wanted to know if something can be called in for her. Call back number [phone].

## 2022-06-28 ENCOUNTER — Encounter: Payer: Self-pay | Admitting: Allergy

## 2022-06-29 ENCOUNTER — Ambulatory Visit (INDEPENDENT_AMBULATORY_CARE_PROVIDER_SITE_OTHER): Payer: Medicare Other | Admitting: Internal Medicine

## 2022-06-29 ENCOUNTER — Other Ambulatory Visit: Payer: Self-pay

## 2022-06-29 ENCOUNTER — Encounter: Payer: Self-pay | Admitting: Internal Medicine

## 2022-06-29 VITALS — BP 122/84 | HR 96 | Temp 98.3°F | Ht 60.0 in | Wt 193.2 lb

## 2022-06-29 DIAGNOSIS — R159 Full incontinence of feces: Secondary | ICD-10-CM

## 2022-06-29 DIAGNOSIS — M6289 Other specified disorders of muscle: Secondary | ICD-10-CM

## 2022-06-29 DIAGNOSIS — L299 Pruritus, unspecified: Secondary | ICD-10-CM

## 2022-06-29 DIAGNOSIS — Z6837 Body mass index (BMI) 37.0-37.9, adult: Secondary | ICD-10-CM

## 2022-06-29 MED ORDER — HYDROXYZINE HCL 25 MG PO TABS: 25.0000 mg | ORAL_TABLET | Freq: Three times a day (TID) | ORAL | 0 refills | Status: DC | PRN

## 2022-06-29 MED ORDER — CLOBETASOL PROPIONATE 0.05 % EX CREA: TOPICAL_CREAM | CUTANEOUS | 1 refills | Status: AC

## 2022-06-29 NOTE — Patient Instructions (Addendum)
Pelvic Floor Dysfunction, Female  Pelvic floor dysfunction (PFD) is a condition that results when the group of muscles and connective tissues that support the organs in the pelvis (pelvic floor muscles) do not work well. These muscles and their connections form a sling that supports the colon and bladder. In women, they also support the uterus. PFD causes pelvic floor muscles to be too weak, too tight, or both. In PFD, muscle movements are not coordinated. This may cause bowel or bladder problems. It may also cause pain. What are the causes? This condition may be caused by an injury to the pelvic area or by a weakening of pelvic muscles. This often results from pregnancy and childbirth or other types of strain. In many cases, the exact cause is not known. What increases the risk? The following factors may make you more likely to develop this condition: Having chronic bladder tissue inflammation (interstitial cystitis). Being an older person. Being overweight. History of radiation treatment for cancer in the pelvic region. Previous pelvic surgery, such as removal of the uterus (hysterectomy). What are the signs or symptoms? Symptoms of this condition vary and may include: Bladder symptoms, such as: Trouble starting urination and emptying the bladder. Frequent urinary tract infections. Leaking urine when coughing, laughing, or exercising (stress incontinence). Having to pass urine urgently or frequently. Pain when passing urine. Bowel symptoms, such as: Constipation. Urgent or frequent bowel movements. Incomplete bowel movements. Painful bowel movements. Leaking stool or gas. Unexplained genital or rectal pain. Genital or rectal muscle spasms. Low back pain. Other symptoms may include: A heavy, full, or aching feeling in the vagina. A bulge that protrudes into the vagina. Pain during or after sex. How is this diagnosed? This condition may be diagnosed based on: Your symptoms and  medical history. A physical exam. During the exam, your health care provider may check your pelvic muscles for tightness, spasm, pain, or weakness. This may include a rectal exam and a pelvic exam. In some cases, you may have diagnostic tests, such as: Electrical muscle function tests. Urine flow testing. X-ray tests of bowel function. Ultrasound of the pelvic organs. How is this treated? Treatment for this condition depends on the symptoms. Treatment options include: Physical therapy. This may include Kegel exercises to help relax or strengthen the pelvic floor muscles. Biofeedback. This type of therapy provides feedback on how tight your pelvic floor muscles are so that you can learn to control them. Internal or external massage therapy. A treatment that involves electrical stimulation of the pelvic floor muscles to help control pain (transcutaneous electrical nerve stimulation, or TENS). Sound wave therapy (ultrasound) to reduce muscle spasms. Medicines, such as: Muscle relaxants. Bladder control medicines. Surgery to reconstruct or support pelvic floor muscles may be an option if other treatments do not help. Follow these instructions at home: Activity Do your usual activities as told by your health care provider. Ask your health care provider if you should modify any activities. Do pelvic floor strengthening or relaxing exercises at home as told by your physical therapist. Lifestyle Maintain a healthy weight. Eat foods that are high in fiber, such as beans, whole grains, and fresh fruits and vegetables. Limit foods that are high in fat and processed sugars, such as fried or sweet foods. Manage stress with relaxation techniques such as yoga or meditation. General instructions If you have problems with leakage: Use absorbable pads or wear padded underwear. Wash frequently with mild soap. Keep your genital and anal area as clean and dry as  possible. Ask your health care provider if  you should try a barrier cream to prevent skin irritation. Take warm baths to relieve pelvic muscle tension or spasms. Take over-the-counter and prescription medicines only as told by your health care provider. Keep all follow-up visits. How is this prevented? The cause of PFD is not always known, but there are a few things you can do to reduce the risk of developing this condition, including: Staying at a healthy weight. Getting regular exercise. Managing stress. Contact a health care provider if: Your symptoms are not improving with home care. You have signs or symptoms of PFD that get worse at home. You develop new signs or symptoms. You have signs of a urinary tract infection, such as: Fever. Chills. Increased urinary frequency. A burning feeling when urinating. You have not had a bowel movement in 3 days (constipation). Summary Pelvic floor dysfunction results when the muscles and connective tissues in your pelvic floor do not work well. These muscles and their connections form a sling that supports your colon and bladder. In women, they also support the uterus. PFD may be caused by an injury to the pelvic area or by a weakening of pelvic muscles. PFD causes pelvic floor muscles to be too weak, too tight, or a combination of both. Symptoms may vary from person to person. In most cases, PFD can be treated with physical therapies and medicines. Surgery may be an option if other treatments do not help. This information is not intended to replace advice given to you by your health care provider. Make sure you discuss any questions you have with your health care provider. Document Revised: 06/10/2020 Document Reviewed: 06/10/2020 Elsevier Patient Education  2023 Elsevier Inc.  Document Revised: 06/12/2020 Document Reviewed: 06/12/2020 Elsevier Patient Education  2023 [employer].

## 2022-06-29 NOTE — Progress Notes (Signed)
I,Victoria T Hamilton,acting as a scribe for Gwynneth Aliment, MD.,have documented all relevant documentation on the behalf of Gwynneth Aliment, MD,as directed by  Gwynneth Aliment, MD while in the presence of Gwynneth Aliment, MD.    Subjective:     Patient ID: Olivia Paul , female    DOB: [birth year] , 67 y.o.   MRN: [id number]   Chief Complaint  Patient presents with   Hyperlipidemia   Prediabetes    HPI  She presents today for Urogyn referral. She was recently diagnosed with pelvic floor dysfunction. She was   She reports visiting allergist specialist last Monday, she received steroid injection along with prednisone. She is here today with scabs on her arms & legs.  She also would like to go over MRI.       Past Medical History:  Diagnosis Date   Arthritis    lt knee   Asthma 2011   Chest pain    Complication of anesthesia    Fatty liver    Headache    sinus   PONV (postoperative nausea and vomiting)    Pre-diabetes    Recurrent upper respiratory infection (URI)    Vertigo    Vitamin D deficiency      Family History  Problem Relation Age of Onset   Kidney disease Mother    Hypertension Mother    Allergic rhinitis Mother    Heart disease Father    Allergic rhinitis Father    Asthma Father    Urticaria Sister    Breast cancer Paternal Grandmother    Breast cancer Maternal Aunt    Breast cancer Paternal Aunt    Angioedema Neg Hx    Atopy Neg Hx    Eczema Neg Hx    Immunodeficiency Neg Hx      Current Outpatient Medications:    albuterol (PROVENTIL) (2.5 MG/3ML) 0.083% nebulizer solution, Use 1 vial every 4-6 hours as needed for cough, wheeze, shortness of breath or chest tightness, Disp: 150 mL, Rfl: 0   albuterol (VENTOLIN HFA) 108 (90 Base) MCG/ACT inhaler, Inhale 2 puffs into the lungs every 4 (four) hours as needed for wheezing or shortness of breath., Disp: 18 g, Rfl: 1   Ascorbic Acid (VITAMIN C) 100 MG tablet, Take 100 mg by mouth daily., Disp: ,  Rfl:    atorvastatin (LIPITOR) 20 MG tablet, Take 1 tablet (20 mg total) by mouth daily., Disp: 90 tablet, Rfl: 1   BREO ELLIPTA 200-25 MCG/ACT AEPB, Inhale 1 puff into the lungs daily., Disp: 60 each, Rfl: 5   cetirizine (ZYRTEC) 10 MG tablet, Take 1 tablet (10 mg total) by mouth daily., Disp: 30 tablet, Rfl: 5   Cholecalciferol (VITAMIN D) 50 MCG (2000 UT) CAPS, Take 5,000 Units by mouth daily. , Disp: , Rfl:    DULoxetine (CYMBALTA) 60 MG capsule, Take 1 capsule (60 mg total) by mouth daily., Disp: 90 capsule, Rfl: 2   EPINEPHrine 0.3 mg/0.3 mL IJ SOAJ injection, Inject 0.3 mLs (0.3 mg total) into the muscle as needed for anaphylaxis., Disp: 1 each, Rfl: 1   fexofenadine (ALLEGRA) 180 MG tablet, TAKE ONE TABLET ONCE DAILY FOR RUNNY NOSE OR ITCHING, Disp: 90 tablet, Rfl: 1   gabapentin (NEURONTIN) 300 MG capsule, daily. , Disp: , Rfl:    hydrOXYzine (ATARAX) 25 MG tablet, Take 1 tablet (25 mg total) by mouth every 8 (eight) hours as needed for itching., Disp: 30 tablet, Rfl: 0   ipratropium (ATROVENT) 0.06 %  nasal spray, 1-2 sprays each nostril up to 3-4 times a day as needed for nasal drainage or congestion, Disp: 15 mL, Rfl: 5   Krill Oil (OMEGA-3) 500 MG CAPS, Take 500 mg by mouth daily., Disp: , Rfl:    magnesium oxide (MAG-OX) 400 MG tablet, Take 400 mg by mouth daily., Disp: , Rfl:    meclizine (ANTIVERT) 50 MG tablet, Take 1/2 tab po qpm prn, Disp: 30 tablet, Rfl: 0   Menthol-Methyl Salicylate (TIGER BALM LINIMENT EX), Apply 1 application topically at bedtime as needed (pain)., Disp: , Rfl:    montelukast (SINGULAIR) 10 MG tablet, Take 1 tablet (10 mg total) by mouth at bedtime., Disp: 30 tablet, Rfl: 5   nitrofurantoin (MACRODANTIN) 50 MG capsule, Take 50 mg by mouth at bedtime., Disp: , Rfl:    oxybutynin (DITROPAN-XL) 10 MG 24 hr tablet, Take 10 mg by mouth daily., Disp: , Rfl:    pantoprazole (PROTONIX) 40 MG tablet, TAKE 1 TABLET BY MOUTH EVERY DAY, Disp: 30 tablet, Rfl: 1    tiZANidine (ZANAFLEX) 4 MG tablet, Take 4 mg by mouth every 8 (eight) hours as needed., Disp: , Rfl:    traMADol (ULTRAM) 50 MG tablet, Take 50 mg by mouth every 8 (eight) hours as needed for moderate pain. , Disp: , Rfl:    triamcinolone cream (KENALOG) 0.1 %, APPLY TO AFFECTED AREA TWICE DAILY AS NEEDED, Disp: 453.6 g, Rfl: 1   TURMERIC PO, Take by mouth., Disp: , Rfl:    Vibegron (GEMTESA) 75 MG TABS, Take 75 tablets by mouth once. Patient reports taking once daily. In the mornings., Disp: , Rfl:    vitamin E 400 UNIT capsule, Take 400 Units by mouth daily., Disp: , Rfl:    vitamin k 100 MCG tablet, Take 100 mcg by mouth daily., Disp: , Rfl:    zonisamide (ZONEGRAN) 50 MG capsule, Take 50 mg by mouth at bedtime., Disp: , Rfl:    clobetasol cream (TEMOVATE) 0.05 %, Apply topically 2 (TWO) times a day as needed to areas of ras, Disp: 60 g, Rfl: 1   predniSONE (DELTASONE) 10 MG tablet, Take 2 tablets twice a day for 3 days, then on the 4th day take 2 tablets in the morning and on the 5th day take one tablet and stop (Patient not taking: Reported on 06/29/2022), Disp: 15 tablet, Rfl: 0   Allergies  Allergen Reactions   Meloxicam Other (See Comments)    Bruised her legs all over     Nsaids Other (See Comments)    Scarring and bruising of legs   Sulfa Antibiotics Other (See Comments)    Mouth broke out in sores   Cat Hair Extract    Pollen Extract    Erythromycin Nausea And Vomiting     Review of Systems  Constitutional: Negative.   Respiratory: Negative.    Cardiovascular: Negative.   Genitourinary:  Positive for urgency.  Skin:  Positive for rash.       She reports visiting allergist specialist last Monday, she received steroid injection along with prednisone. She is here today with scabs on her arms & legs.   Neurological: Negative.   Psychiatric/Behavioral: Negative.       Today's Vitals   06/29/22 0921  BP: 122/84  Pulse: 96  Temp: 98.3 F (36.8 C)  SpO2: 98%  Weight: 193  lb 3.2 oz (87.6 kg)  Height: 5' (1.524 m)   Wt Readings from Last 3 Encounters:  06/29/22 193 lb 3.2  oz (87.6 kg)  06/20/22 200 lb 3.2 oz (90.8 kg)  06/20/22 200 lb 14.4 oz (91.1 kg)    Body mass index is 37.73 kg/m.  The ASCVD Risk score (Arnett DK, et al., 2019) failed to calculate for the following reasons:   The valid total cholesterol range is 130 to 320 mg/dL ++ Objective:  Physical Exam Vitals and nursing note reviewed.  Constitutional:      Appearance: Normal appearance. She is obese.  HENT:     Head: Normocephalic and atraumatic.  Eyes:     Extraocular Movements: Extraocular movements intact.  Cardiovascular:     Rate and Rhythm: Normal rate and regular rhythm.     Heart sounds: Normal heart sounds.  Pulmonary:     Effort: Pulmonary effort is normal.     Breath sounds: Normal breath sounds.  Musculoskeletal:     Cervical back: Normal range of motion.  Skin:    General: Skin is warm.  Neurological:     General: No focal deficit present.     Mental Status: She is alert.  Psychiatric:        Mood and Affect: Mood normal.        Behavior: Behavior normal.     Assessment And Plan:     1. Pelvic floor dysfunction in female Comments: She agrees w/ UroGYN referral. GI notes reviewed in Care Everywhere. MRI pelvis reviewed in detail as well. - Ambulatory referral to Urogynecology  2. Incontinence of feces, unspecified fecal incontinence type - Ambulatory referral to Urogynecology  3. Pruritus Comments: She was recently seen by Allergy. Papular rash noted today. She will f/u wit Allergy. I will send rx hydroxyzine to use prn.  4. Class 2 severe obesity due to excess calories with serious comorbidity and body mass index (BMI) of 37.0 to 37.9 in adult [facility]) She is encouraged to strive for BMI less than 30 to decrease cardiac risk. Advised to aim for at least 150 minutes of exercise per week.   Return if symptoms worsen or fail to improve.  Patient was given  opportunity to ask questions. Patient verbalized understanding of the plan and was able to repeat key elements of the plan. All questions were answered to their satisfaction.   I, Gwynneth Aliment, MD, have reviewed all documentation for this visit. The documentation on 06/29/22 for the exam, diagnosis, procedures, and orders are all accurate and complete.   IF YOU HAVE BEEN REFERRED TO A SPECIALIST, IT MAY TAKE 1-2 WEEKS TO SCHEDULE/PROCESS THE REFERRAL. IF YOU HAVE NOT HEARD FROM US/SPECIALIST IN TWO WEEKS, PLEASE GIVE [place] A CALL AT [phone].   THE PATIENT IS ENCOURAGED TO PRACTICE SOCIAL DISTANCING DUE TO THE COVID-19 PANDEMIC.

## 2022-07-14 ENCOUNTER — Other Ambulatory Visit: Payer: Self-pay | Admitting: Internal Medicine

## 2022-08-11 ENCOUNTER — Other Ambulatory Visit: Payer: Self-pay | Admitting: Internal Medicine

## 2022-08-15 ENCOUNTER — Ambulatory Visit: Payer: Medicare Other | Admitting: Obstetrics and Gynecology

## 2022-08-31 ENCOUNTER — Ambulatory Visit (INDEPENDENT_AMBULATORY_CARE_PROVIDER_SITE_OTHER): Payer: Medicare Other | Admitting: Obstetrics and Gynecology

## 2022-08-31 ENCOUNTER — Encounter: Payer: Self-pay | Admitting: Obstetrics and Gynecology

## 2022-08-31 VITALS — BP 120/84 | HR 79 | Ht 59.0 in | Wt 196.0 lb

## 2022-08-31 DIAGNOSIS — R350 Frequency of micturition: Secondary | ICD-10-CM

## 2022-08-31 DIAGNOSIS — N993 Prolapse of vaginal vault after hysterectomy: Secondary | ICD-10-CM

## 2022-08-31 DIAGNOSIS — N816 Rectocele: Secondary | ICD-10-CM

## 2022-08-31 DIAGNOSIS — R159 Full incontinence of feces: Secondary | ICD-10-CM

## 2022-08-31 DIAGNOSIS — N390 Urinary tract infection, site not specified: Secondary | ICD-10-CM | POA: Diagnosis not present

## 2022-08-31 DIAGNOSIS — N8110 Cystocele, unspecified: Secondary | ICD-10-CM

## 2022-08-31 LAB — POCT URINALYSIS DIPSTICK
Bilirubin, UA: NEGATIVE
Blood, UA: NEGATIVE
Glucose, UA: NEGATIVE
Ketones, UA: NEGATIVE
Leukocytes, UA: NEGATIVE
Nitrite, UA: NEGATIVE
Protein, UA: NEGATIVE
Spec Grav, UA: 1.015 (ref 1.010–1.025)
Urobilinogen, UA: 0.2 E.U./dL
pH, UA: 7 (ref 5.0–8.0)

## 2022-08-31 MED ORDER — NITROFURANTOIN MACROCRYSTAL 50 MG PO CAPS: 50.0000 mg | ORAL_CAPSULE | Freq: Every day | ORAL | 0 refills | Status: DC

## 2022-08-31 NOTE — Patient Instructions (Addendum)
Fill out bladder diary  Consider surgical options.   We will do bladder testing to determine your leakage style as well.  After bladder testing we will have you come back and meet with the surgeon to decide.   Our goal would be to get you off the antibiotics and on an antiinfective.  You have stage 2-3 out of 4 Anterior Vaginal wall prolapse (Bladder wall), Stage 2 Apical prolapse (Vaginal vault), and stage 2 out of 4 Posterior vaginal wall prolapse (Rectal wall)

## 2022-08-31 NOTE — Progress Notes (Signed)
[facility] New Patient Evaluation and Consultation  Referring Provider: Dorothyann Peng, MD PCP: Dorothyann Peng, MD Date of Service: 08/31/2022  SUBJECTIVE Chief Complaint: New Patient (Initial Visit) Olivia Paul is a 67 y.o. female here for a consult for incontinence and prolapse./)  History of Present Illness: Olivia Paul is a 67 y.o. Black or African-American female seen in consultation at the request of Dr. Allyne Gee for evaluation of prolapse. Reports she has tried Oxybutynin and Gemtesa for her OAB symptoms. She is taking both for her OAB symptoms. She reports she is also on prophylactic antibiotics.   Review of records significant for: Las A1c 6.1 05/10/22 Reports hx of Sleep apnea and wears a cpap Is on Macrobid 50mg  daily for UTI prophylaxis Using Premarin for atrophy/UTI prevention   Urinary Symptoms: Leaks urine with cough/ sneeze, laughing, exercise, with movement to the bathroom, with urgency, without sensation, and while asleep Leaks unknown time(s) per days.  Pad use: 3 or more pads per day.   She is bothered by her UI symptoms.  Day time voids 6-10.  Nocturia: 3 times per night to void. Voiding dysfunction: she empties her bladder well.  does not use a catheter to empty bladder.  When urinating, she feels she has no difficulties Drinks: Water, Gatorade, Sugar free soda, 1 cup decaf coffee per day  UTIs: 4 or more UTI's in the last year.    E.Coli that is pansensitive has been growing  Denies history of blood in urine, kidney or bladder stones, pyelonephritis, bladder cancer, and kidney cancer  Pelvic Organ Prolapse Symptoms:                  She Admits to a feeling of a bulge the vaginal area.  She Admits to seeing a bulge.  This bulge is bothersome.  Bowel Symptom: Bowel movements: 1 time(s) per day Stool consistency: soft  Straining: no.  Splinting: no.  Incomplete evacuation: no.  She Admits to accidental bowel leakage / fecal  incontinence  Occurs: intermittently   Consistency with leakage: soft  (Stool  Bowel regimen: none Last colonoscopy: Date 05/27/22, Results diverticulosis  Sexual Function Sexually active: no.  Sexual orientation: Straight Pain with sex: No  Pelvic Pain Admits to pelvic pain Location: lower stomach Pain occurs: intermittently  Prior pain treatment: None Improved by: Laying down    Past Medical History:  Past Medical History:  Diagnosis Date   Arthritis    lt knee   Asthma 2011   Chest pain    Complication of anesthesia    Fatty liver    Headache    sinus   PONV (postoperative nausea and vomiting)    Pre-diabetes    Recurrent upper respiratory infection (URI)    Vertigo    Vitamin D deficiency      Past Surgical History:   Past Surgical History:  Procedure Laterality Date   BREAST BIOPSY     BREAST EXCISIONAL BIOPSY     KNEE ARTHROSCOPY WITH MENISCAL REPAIR Left 02/21/2018   LIPOMA EXCISION     TOTAL KNEE ARTHROPLASTY Left 11/16/2018   Procedure: TOTAL KNEE ARTHROPLASTY;  Surgeon: Jodi Geralds, MD;  Location: [facility];  Service: Orthopedics;  Laterality: Left;   total knee arthroplasty-revision Left 08/31/2020   Dr, Sherilyn Banker   TRIGGER FINGER RELEASE Right 12/25/2020     Past OB/GYN History: G2 P2 Menopausal: Yes Last pap smear was 2023.  Any history of abnormal pap smears: no.   Medications: She has  a current medication list which includes the following prescription(s): albuterol, albuterol, vitamin c, atorvastatin, breo ellipta, cetirizine, vitamin d, clobetasol cream, duloxetine, epinephrine, fexofenadine, gabapentin, hydroxyzine, ipratropium, omega-3, magnesium oxide, meclizine, menthol-methyl salicylate, montelukast, oxybutynin, pantoprazole, tizanidine, tramadol, triamcinolone cream, turmeric, gemtesa, vitamin e, vitamin k, zonisamide, and nitrofurantoin.   Allergies: Patient is allergic to meloxicam, nsaids, sulfa antibiotics, cat hair extract,  pollen extract, and erythromycin.   Social History:  Social History   Tobacco Use   Smoking status: Never    Passive exposure: Past   Smokeless tobacco: Never  Vaping Use   Vaping status: Never Used  Substance Use Topics   Alcohol use: No    Alcohol/week: 0.0 standard drinks of alcohol   Drug use: No    Relationship status: single She lives with a relative.   She is not employed. Regular exercise: Yes: water aerobics History of abuse: No  Family History:   Family History  Problem Relation Age of Onset   Kidney disease Mother    Hypertension Mother    Allergic rhinitis Mother    Heart disease Father    Allergic rhinitis Father    Asthma Father    Urticaria Sister    Breast cancer Paternal Grandmother    Breast cancer Maternal Aunt    Breast cancer Paternal Aunt    Angioedema Neg Hx    Atopy Neg Hx    Eczema Neg Hx    Immunodeficiency Neg Hx      Review of Systems: Review of Systems  Constitutional:  Positive for malaise/fatigue.  Respiratory:  Negative for cough, shortness of breath and wheezing.   Cardiovascular:  Negative for chest pain, palpitations and leg swelling.  Gastrointestinal:  Negative for abdominal pain, blood in stool and constipation.  Genitourinary:  Positive for frequency. Negative for flank pain.       +Vaginal D/c  Neurological:  Positive for weakness and headaches. Negative for dizziness.  Endo/Heme/Allergies:  Does not bruise/bleed easily.  Psychiatric/Behavioral:  Negative for depression and suicidal ideas. The patient is not nervous/anxious.      OBJECTIVE Physical Exam: Vitals:   08/31/22 0818  BP: 120/84  Pulse: 79  Weight: 196 lb (88.9 kg)  Height: 4\' 11"  (1.499 m)    Physical Exam Constitutional:      Appearance: Normal appearance.  Pulmonary:     Effort: Pulmonary effort is normal.  Abdominal:     General: Abdomen is flat.  Skin:    General: Skin is warm and dry.  Neurological:     Mental Status: She is alert and  oriented to person, place, and time.  Psychiatric:        Mood and Affect: Mood normal.        Thought Content: Thought content normal.        Judgment: Judgment normal.      GU / Detailed Urogynecologic Evaluation:  Pelvic Exam: Normal external female genitalia; Bartholin's and Skene's glands normal in appearance; urethral meatus normal in appearance, no urethral masses or discharge.   CST: negative   s/p hysterectomy: Speculum exam reveals normal vaginal mucosa with  atrophy and normal vaginal cuff.  Adnexa normal adnexa.    With apex supported, anterior compartment defect was present  Pelvic floor strength I/V  Poor muscle coordination  Pelvic floor musculature: Right levator non-tender, Right obturator non-tender, Left levator non-tender, Left obturator non-tender  POP-Q:   POP-Q  0  Aa   0                                           Ba  -4                                              C   3                                            Gh  4                                            Pb  6                                            tvl   -1                                            Ap  -1                                            Bp                                                 D      Rectal Exam:  Normal external rectal exam.   Post-Void Residual (PVR) by Bladder Scan: In order to evaluate bladder emptying, we discussed obtaining a postvoid residual and she agreed to this procedure.  Procedure: The ultrasound unit was placed on the patient's abdomen in the suprapubic region after the patient had voided. A PVR of 66 ml was obtained by bladder scan.  Laboratory Results: POC urine: Negative for all components  ASSESSMENT AND PLAN Ms. Pies is a 67 y.o. with:  1. Prolapse of anterior vaginal wall   2. Vaginal vault prolapse after hysterectomy   3. Prolapse of posterior vaginal wall   4. Urinary frequency    5. Recurrent UTI   6. Incontinence of feces, unspecified fecal incontinence type    Patient has stage II-III/IV anterior vaginal wall prolapse. She does not have good coordination of her pelvic floor muscles, so when asking to push she was not able to give a good valsalva to see movement in the pelvic floor.  Patient has stage II/IV vaginal vault prolapse after hysterectomy. Again, POP-Q was difficult for visualization based on patient's poor muscle coordination. She reports when she had her uterine prolapse and was put under anesthesia, she was told she had stage IV/IV uterine prolapse but that it was not  well visualized on exam due to her pelvic floor dysfunction.  Patient has stage II/IV Posterior vaginal wall prolapse. For treatment of pelvic organ prolapse, we discussed options for management including expectant management, conservative management, and surgical management, such as Kegels, a pessary, pelvic floor physical therapy, and specific surgical procedures. She was most interested in discussing surgical options today. We discussed Sacrocolpopexy, Sacrocpinous ligament fixation.  We discussed that for her urinary frequency/urgency she can continue on the [facility] for now. She may want to discuss other options such as SNM, bladder botox, or PTNS, but she was already overwhelmed with prolapse treatment options and did not want to further overwhelm her.  Patient is currently on 50mg  Nitrofurantoin daily from [facility] for recurrent UTIs. Provided one refill at her request but the goal is to get her off antibiotics and either on a preventative or anti-infective so she does not stay on antibiotics. There was some possible concern of patient having colonization for E.coli, but with her reported fecal incontinence, they are most likely related.  Patient reports she is having loss of stool that is Bristol stool scale #4. She reports she does not have the sensation or urgency with loss of stool. She  would benefit from pelvic floor PT and possibly SNM but will focus on prolapse for now and then can focus more on the other problems.   Patient to return for Urodynamics and then follow up with surgeon for surgical planning.     Selmer Dominion, NP

## 2022-09-19 ENCOUNTER — Ambulatory Visit (INDEPENDENT_AMBULATORY_CARE_PROVIDER_SITE_OTHER): Payer: Medicare Other | Admitting: Obstetrics and Gynecology

## 2022-09-19 VITALS — BP 177/77 | HR 78

## 2022-09-19 DIAGNOSIS — R350 Frequency of micturition: Secondary | ICD-10-CM

## 2022-09-19 DIAGNOSIS — N3281 Overactive bladder: Secondary | ICD-10-CM

## 2022-09-19 LAB — POCT URINALYSIS DIPSTICK
Bilirubin, UA: NEGATIVE
Blood, UA: NEGATIVE
Glucose, UA: NEGATIVE
Ketones, UA: NEGATIVE
Leukocytes, UA: NEGATIVE
Nitrite, UA: NEGATIVE
Protein, UA: NEGATIVE
Spec Grav, UA: 1.025 (ref 1.010–1.025)
Urobilinogen, UA: 0.2 E.U./dL
pH, UA: 5 (ref 5.0–8.0)

## 2022-09-19 NOTE — Progress Notes (Unsigned)
[facility] Urodynamics Procedure  Referring Physician: Dorothyann Peng, MD Date of Procedure: 09/19/2022  Olivia Paul is a 67 y.o. female who presents for urodynamic evaluation. Indication(s) for study: {UDS indications:24784}  Vital Signs: BP (!) 177/77   Pulse 78   Laboratory Results: A {clean catch/ cath:24785} urine specimen revealed:  POC urine:    Voiding Diary: The patient voided {NUMBERS; 1-20:18551} times per day. Voided volumes ranged from *** mL to *** mL, with an average of *** mL.  Intervals between voids ranged from *** hr to ***hr with an average interval between voids of *** hr.  Intake per day ranged from ***mL to ***mL.  Output ranged from ***mL to ***mL.  She had *** incontinence episodes per day (*** UUI, *** SUI) and *** episodes of nocturia.  She drinks: *** oz of caffeinated beverages, *** oz of juice, and *** oz of alcohol per day.  Procedure Timeout:  The correct patient was verified and the correct procedure was verified. The patient was in the correct position and safety precautions were reviewed based on at the patient's history.  Urodynamic Procedure A 38F dual lumen urodynamics catheter was placed under sterile conditions into the patient's bladder. A 38F catheter was placed into the {vagina/ rectum:24786} in order to measure abdominal pressure. EMG patches were placed in the appropriate position.  All connections were confirmed and calibrations/adjusted made. Saline was instilled into the bladder through the dual lumen catheters.  Cough/valsalva pressures were measured periodically during filling.  Patient was allowed to void.  The bladder was then emptied of its residual.  UROFLOW: Revealed a Qmax of *** mL/sec.  She voided *** mL and had a residual of *** mL.  It was a {uroflow:24789} pattern and represented normal habits ***though interpretation limited due to low voided volume.***  CMG: This was performed with sterile water in the  sitting position at a fill rate of 30*** mL/min.    First sensation of fullness was *** mLs,  First urge was *** mLs,  Strong urge was *** mLs and  Capacity was *** mLs  Stress incontinence {WAS/WAS NOT:[phone]::"was not"} demonstrated {highest/ lowest:24787} {Desc; negative/positive:13464} CLPP was *** cmH20 at *** ml. {highest/ lowest:24787} {Desc; negative/positive:13464} VLPP was *** cmH20at *** ml. {highest/ lowest:24787} {Desc; negative/positive:13464} Barrier CLPP was *** cmH20 at *** ml. {highest/ lowest:24787} {Desc; negative/positive:13464} Barrier VLPP was *** cmH20 at *** ml.  Detrusor function was {Desc; normal/underactive/overactive:13463}, {With/no:32556} phasic contractions seen.  The first occurred at *** mL to *** cm of water and {WAS/WAS NOT:[phone]::"was not"} associated with {urge/ WUJW:11914}.  Compliance:  ***. End fill detrusor pressure was ***cmH20.  Calculated compliance was ***[id number]/[id number]  UPP: MUCP {With/without:5700} barrier reduction was *** cm of water.    MICTURITION STUDY: Voiding was performed {reduction:24791} in the sitting position.  Pdet at Qmax was *** cm of water.  Qmax was *** mL/sec.  It was a {uroflow:24789} pattern.  She voided *** mL and had a residual of *** mL.  It was a volitional void, sustained detrusor contraction was {DESC; PRESENT/NOT PRESENT:21021351} and abdominal straining was {DESC; PRESENT/NOT PRESENT:21021351}  EMG: This was performed with patches.  She had voluntary contractions, recruitment with fill was {DESC; PRESENT/NOT PRESENT:21021351} and urethral sphincter was {relaxed:24792} with void.  The details of the procedure with the study tracings have been scanned into EPIC.   Urodynamic Impression:  1. Sensation was {DESC; NORMAL/REDUCED/ABSENT/INCREASED:23133}; capacity was {DESC; NORMAL/REDUCED/ABSENT/INCREASED:23133} 2. Stress Incontinence {WAS/WAS NOT:[phone]::"was not"} demonstrated at {ISD:24793} pressures; 3.  Detrusor  Overactivity {WAS/WAS NOT:[phone]::"was not"} demonstrated {With-without:32421} leakage. 4. Emptying was {dysfunctional:24794} with a {elevated:24795} PVR, a sustained detrusor contraction {DESC; PRESENT/NOT PRESENT:21021351},  abdominal straining {DESC; PRESENT/NOT PRESENT:21021351}, {dyssynergic:24796} urethral sphincter activity on EMG.  Plan: - The patient will follow up  to discuss the findings and treatment options.

## 2022-09-22 ENCOUNTER — Ambulatory Visit (INDEPENDENT_AMBULATORY_CARE_PROVIDER_SITE_OTHER): Payer: Medicare Other | Admitting: Allergy

## 2022-09-22 ENCOUNTER — Other Ambulatory Visit: Payer: Self-pay

## 2022-09-22 ENCOUNTER — Encounter: Payer: Self-pay | Admitting: Allergy

## 2022-09-22 ENCOUNTER — Ambulatory Visit: Payer: Medicare Other | Admitting: Allergy

## 2022-09-22 VITALS — BP 134/82 | HR 83 | Temp 98.0°F | Resp 18 | Ht 59.0 in | Wt 199.1 lb

## 2022-09-22 DIAGNOSIS — L508 Other urticaria: Secondary | ICD-10-CM

## 2022-09-22 DIAGNOSIS — J3089 Other allergic rhinitis: Secondary | ICD-10-CM

## 2022-09-22 DIAGNOSIS — H1013 Acute atopic conjunctivitis, bilateral: Secondary | ICD-10-CM

## 2022-09-22 DIAGNOSIS — J4540 Moderate persistent asthma, uncomplicated: Secondary | ICD-10-CM | POA: Diagnosis not present

## 2022-09-22 MED ORDER — FEXOFENADINE HCL 180 MG PO TABS: ORAL_TABLET | ORAL | 1 refills | Status: DC

## 2022-09-22 MED ORDER — HYDROXYZINE HCL 25 MG PO TABS: 25.0000 mg | ORAL_TABLET | Freq: Every evening | ORAL | 0 refills | Status: DC | PRN

## 2022-09-22 MED ORDER — CETIRIZINE HCL 10 MG PO TABS: 10.0000 mg | ORAL_TABLET | Freq: Every day | ORAL | 5 refills | Status: AC

## 2022-09-22 MED ORDER — ALBUTEROL SULFATE (2.5 MG/3ML) 0.083% IN NEBU: INHALATION_SOLUTION | RESPIRATORY_TRACT | 0 refills | Status: DC

## 2022-09-22 MED ORDER — IPRATROPIUM BROMIDE 0.06 % NA SOLN: NASAL | 5 refills | Status: DC

## 2022-09-22 MED ORDER — ALBUTEROL SULFATE HFA 108 (90 BASE) MCG/ACT IN AERS: 2.0000 | INHALATION_SPRAY | RESPIRATORY_TRACT | 1 refills | Status: DC | PRN

## 2022-09-22 MED ORDER — PREDNISONE 20 MG PO TABS: 20.0000 mg | ORAL_TABLET | Freq: Two times a day (BID) | ORAL | 0 refills | Status: AC

## 2022-09-22 MED ORDER — BREO ELLIPTA 200-25 MCG/ACT IN AEPB: 1.0000 | INHALATION_SPRAY | Freq: Every day | RESPIRATORY_TRACT | 5 refills | Status: DC

## 2022-09-22 MED ORDER — MONTELUKAST SODIUM 10 MG PO TABS: 10.0000 mg | ORAL_TABLET | Freq: Every day | ORAL | 5 refills | Status: DC

## 2022-09-22 MED ORDER — FAMOTIDINE 20 MG PO TABS: 20.0000 mg | ORAL_TABLET | Freq: Two times a day (BID) | ORAL | 5 refills | Status: DC

## 2022-09-22 NOTE — Patient Instructions (Addendum)
Urticaria, chronic - having recurrent episodes of hives and itching - at this time etiology of hives is unknown but likely with a allergen component.  Hives can be caused by a variety of different triggers including illness/infection, foods, medications, stings, exercise, pressure, vibrations, extremes of temperature to name a few however majority of the time there is no identifiable trigger.   - start following high-dose antihistamine regimen:  Allegra in AM, Zyrtec or Allegra in PM, Pepcid 20mg  1 tab twice a day in AM and in PM, Singulair daily.   Can continue use of hydroxyzine 25mg  at bedtime.   - discussed starting Xolair monthly injections for hive control.  Benefits, risks and protocol discussed and informational brochure provided.  Tammy, our Public librarian, will call you regarding insurance approval and starting.   - take Prednisone 20mg  twice a day for next 5 days - you can stop applying creams/ointments as likely not going to be effective - continue daily moisturizing skin  Asthma - use Breo 1 puff once a day. Rinse mouth after use.  - albuterol as needed 2 puffs every 4-6 hours for cough, wheeze, chest tightness, difficulty breathing - continue singulair 10mg  at bedtime daily  Asthma control goals:  Full participation in all desired activities (may need albuterol before activity) Albuterol use two time or less a week on average (not counting use with activity) Cough interfering with sleep two time or less a month Oral steroids no more than once a year No hospitalizations  Allergies - continue singulair as above - use Allegra 180mg  or Zyrtec 10mg  daily as needed - use nasal Atrovent 1-2 sprays each nostril up to 3-4 times a day as needed for nasal drainage or congestion.     Follow-up in 6 months or sooner if needed

## 2022-09-22 NOTE — Progress Notes (Signed)
Follow-up Note  RE: Olivia Paul MRN: [id number] DOB: 1956-01-19 Date of Office Visit: 09/22/2022   History of present illness: Olivia Paul is a 67 y.o. female presenting today for itching and rash.  She was last seen in the office on 06/20/2022 by our nurse practitioner Amada Jupiter.  At that visit she had a similar symptoms that she has now with urticarial rash.  She was given a Depo-Medrol injection in the office however she did not find much improvement in her itching and rash symptoms with this and several days later called back with her symptoms and was prescribed a prednisone burst pack.  This helped with her symptoms at that time.  The rash has returned and she is quite itchy and she is concerned about the scars that is leaving behind.  The hives are everywhere.  Right now she states it is worse in between her breast area.  She is taking hydroxyzine at night to help with the itching.  She takes Zyrtec in the evening which makes her drowsy.  She typically takes Allegra during the day as this does not make her drowsy.  She has been applying everything she can think of including calamine lotion and rubbing alcohol.  She would like for some relief of the symptoms.  This has been an ongoing issue now for the most of the year. She states she has been taking all of her routine medications as directed which includes her Breo daily and Singulair daily.  Review of systems: 10pt ROS negative unless noted above in HPI  Past medical/social/surgical/family history have been reviewed and are unchanged unless specifically indicated below.  No changes  Medication List: Current Outpatient Medications  Medication Sig Dispense Refill   Ascorbic Acid (VITAMIN C) 100 MG tablet Take 100 mg by mouth daily.     atorvastatin (LIPITOR) 20 MG tablet TAKE 1 TABLET BY MOUTH EVERY DAY 90 tablet 1   Cholecalciferol (VITAMIN D) 50 MCG (2000 UT) CAPS Take 5,000 Units by mouth daily.      clobetasol cream (TEMOVATE)  0.05 % Apply topically 2 (TWO) times a day as needed to areas of ras 60 g 1   DULoxetine (CYMBALTA) 60 MG capsule Take 1 capsule (60 mg total) by mouth daily. 90 capsule 2   EPINEPHrine 0.3 mg/0.3 mL IJ SOAJ injection Inject 0.3 mLs (0.3 mg total) into the muscle as needed for anaphylaxis. 1 each 1   famotidine (PEPCID) 20 MG tablet Take 1 tablet (20 mg total) by mouth 2 (two) times daily. 30 tablet 5   gabapentin (NEURONTIN) 300 MG capsule daily.      Krill Oil (OMEGA-3) 500 MG CAPS Take 500 mg by mouth daily.     magnesium oxide (MAG-OX) 400 MG tablet Take 400 mg by mouth daily.     meclizine (ANTIVERT) 50 MG tablet Take 1/2 tab po qpm prn 30 tablet 0   Menthol-Methyl Salicylate (TIGER BALM LINIMENT EX) Apply 1 application topically at bedtime as needed (pain).     nitrofurantoin (MACRODANTIN) 50 MG capsule Take 1 capsule (50 mg total) by mouth at bedtime. 30 capsule 0   oxybutynin (DITROPAN-XL) 10 MG 24 hr tablet Take 10 mg by mouth daily.     pantoprazole (PROTONIX) 40 MG tablet TAKE 1 TABLET BY MOUTH EVERY DAY 90 tablet 1   predniSONE (DELTASONE) 20 MG tablet Take 1 tablet (20 mg total) by mouth 2 (two) times daily with a meal for 5 days. 10 tablet 0  tiZANidine (ZANAFLEX) 4 MG tablet Take 4 mg by mouth every 8 (eight) hours as needed.     traMADol (ULTRAM) 50 MG tablet Take 50 mg by mouth every 8 (eight) hours as needed for moderate pain.      triamcinolone cream (KENALOG) 0.1 % APPLY TO AFFECTED AREA TWICE DAILY AS NEEDED 453.6 g 1   TURMERIC PO Take by mouth.     Vibegron (GEMTESA) 75 MG TABS Take 75 tablets by mouth once. Patient reports taking once daily. In the mornings.     vitamin E 400 UNIT capsule Take 400 Units by mouth daily.     vitamin k 100 MCG tablet Take 100 mcg by mouth daily.     zonisamide (ZONEGRAN) 50 MG capsule Take 50 mg by mouth at bedtime.     albuterol (PROVENTIL) (2.5 MG/3ML) 0.083% nebulizer solution Use 1 vial every 4-6 hours as needed for cough, wheeze,  shortness of breath or chest tightness 150 mL 0   albuterol (VENTOLIN HFA) 108 (90 Base) MCG/ACT inhaler Inhale 2 puffs into the lungs every 4 (four) hours as needed for wheezing or shortness of breath. 18 g 1   BREO ELLIPTA 200-25 MCG/ACT AEPB Inhale 1 puff into the lungs daily. 60 each 5   cetirizine (ZYRTEC) 10 MG tablet Take 1 tablet (10 mg total) by mouth daily. 30 tablet 5   fexofenadine (ALLEGRA) 180 MG tablet TAKE ONE TABLET ONCE DAILY FOR RUNNY NOSE OR ITCHING 90 tablet 1   hydrOXYzine (ATARAX) 25 MG tablet Take 1 tablet (25 mg total) by mouth at bedtime as needed for itching. 30 tablet 0   ipratropium (ATROVENT) 0.06 % nasal spray 1-2 sprays each nostril up to 3-4 times a day as needed for nasal drainage or congestion 15 mL 5   montelukast (SINGULAIR) 10 MG tablet Take 1 tablet (10 mg total) by mouth at bedtime. 30 tablet 5   No current facility-administered medications for this visit.     Known medication allergies: Allergies  Allergen Reactions   Meloxicam Other (See Comments)    Bruised her legs all over     Nsaids Other (See Comments)    Scarring and bruising of legs   Sulfa Antibiotics Other (See Comments)    Mouth broke out in sores   Cat Hair Extract    Pollen Extract    Erythromycin Nausea And Vomiting     Physical examination: Blood pressure 134/82, pulse 83, temperature 98 F (36.7 C), resp. rate 18, height 4\' 11"  (1.499 m), weight 199 lb 1.6 oz (90.3 kg), SpO2 97%.  General: Alert, interactive, in no acute distress. HEENT: PERRLA, TMs pearly gray, turbinates non-edematous without discharge, post-pharynx non erythematous. Neck: Supple without lymphadenopathy. Lungs: Clear to auscultation without wheezing, rhonchi or rales. {no increased work of breathing. CV: Normal S1, S2 without murmurs. Abdomen: Nondistended, nontender. Skin: Erythematous papules between and under breasts, left medial knee area with an erythematous papule, bilateral forearms with  hyperpigmented macules . Extremities:  No clubbing, cyanosis or edema. Neuro:   Grossly intact.  Diagnositics/Labs: Spirometry: FEV1: 1.53L 88%, FVC: 1.77 L 80%, ratio consistent with nonobstructive pattern  Assessment and plan: Urticaria, chronic - having recurrent episodes of hives and itching - at this time etiology of hives is unknown but likely with a allergen component.  Hives can be caused by a variety of different triggers including illness/infection, foods, medications, stings, exercise, pressure, vibrations, extremes of temperature to name a few however majority of the time there is  no identifiable trigger.   - start following high-dose antihistamine regimen:  Allegra in AM, Zyrtec or Allegra in PM, Pepcid 20mg  1 tab twice a day in AM and in PM, Singulair daily.   Can continue use of hydroxyzine 25mg  at bedtime.   - discussed starting Xolair monthly injections for hive control.  Benefits, risks and protocol discussed and informational brochure provided.  Tammy, our Public librarian, will call you regarding insurance approval and starting.   - take Prednisone 20mg  twice a day for next 5 days - you can stop applying creams/ointments as likely not going to be effective - continue daily moisturizing skin  Asthma - use Breo 1 puff once a day. Rinse mouth after use.  - albuterol as needed 2 puffs every 4-6 hours for cough, wheeze, chest tightness, difficulty breathing - continue singulair 10mg  at bedtime daily  Asthma control goals:  Full participation in all desired activities (may need albuterol before activity) Albuterol use two time or less a week on average (not counting use with activity) Cough interfering with sleep two time or less a month Oral steroids no more than once a year No hospitalizations  Allergic rhinitis - continue singulair as above - use Allegra 180mg  or Zyrtec 10mg  daily as needed - use nasal Atrovent 1-2 sprays each nostril up to 3-4 times a day  as needed for nasal drainage or congestion.     Follow-up in 6 months or sooner if needed  I appreciate the opportunity to take part in Amylee's care. Please do not hesitate to contact me with questions.  Sincerely,   Margo Aye, MD Allergy/Immunology [facility]

## 2022-09-27 ENCOUNTER — Telehealth: Payer: Self-pay | Admitting: *Deleted

## 2022-09-27 NOTE — Telephone Encounter (Signed)
-----   Message from [facility] sent at 09/22/2022  1:37 PM EDT ----- Patient is interested in starting Xolair for hive control.  She would like to do at home administration after the 3 doses in office.

## 2022-09-27 NOTE — Telephone Encounter (Signed)
L/m for patient to contact me to discuss Xolair

## 2022-09-28 ENCOUNTER — Encounter: Payer: Self-pay | Admitting: Obstetrics and Gynecology

## 2022-09-28 ENCOUNTER — Ambulatory Visit (INDEPENDENT_AMBULATORY_CARE_PROVIDER_SITE_OTHER): Payer: Medicare Other | Admitting: Obstetrics and Gynecology

## 2022-09-28 VITALS — BP 132/88 | HR 85

## 2022-09-28 DIAGNOSIS — N993 Prolapse of vaginal vault after hysterectomy: Secondary | ICD-10-CM

## 2022-09-28 DIAGNOSIS — N3281 Overactive bladder: Secondary | ICD-10-CM | POA: Diagnosis not present

## 2022-09-28 DIAGNOSIS — N8110 Cystocele, unspecified: Secondary | ICD-10-CM

## 2022-09-28 DIAGNOSIS — N816 Rectocele: Secondary | ICD-10-CM

## 2022-09-28 NOTE — Progress Notes (Signed)
[facility] Return Visit  SUBJECTIVE  History of Present Illness: Olivia Paul is a 67 y.o. female seen in follow-up for prolapse, bladder and bowel incontinence. She underwent urodynamic testing.   Urodynamic Impression:  1. Sensation was increased; capacity was reduced 2. Stress Incontinence was not demonstrated at normal pressures; 3. 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 4. Emptying was dysfunctional with a normal PVR, a sustained detrusor contraction not present,  abdominal straining present, dyssynergic urethral sphincter activity on EMG. 5. Low compliance demonstrated with calculated compliance being 17.1  She has been on the gemtesa and oxybutynin. She is only taking gemtesa. She has seen some improvement, but still has nights where she gets up frequently.   Has a history of prior hysterectomy for prolapse and prolapse repair.   Past Medical History: Patient  has a past medical history of Arthritis, Asthma (2011), Chest pain, Complication of anesthesia, Fatty liver, Headache, PONV (postoperative nausea and vomiting), Pre-diabetes, Recurrent upper respiratory infection (URI), Vertigo, and Vitamin D deficiency.   Past Surgical History: She  has a past surgical history that includes Breast biopsy; Breast excisional biopsy; Knee arthroscopy with meniscal repair (Left, 02/21/2018); Total knee arthroplasty (Left, 11/16/2018); total knee arthroplasty-revision (Left, 08/31/2020); Lipoma excision; Trigger finger release (Right, 12/25/2020); Vaginal hysterectomy; and Vaginal prolapse repair.   Medications: She has a current medication list which includes the following prescription(s): albuterol, albuterol, vitamin c, atorvastatin, breo ellipta, cetirizine, vitamin d, clobetasol cream, duloxetine, epinephrine, famotidine, fexofenadine, gabapentin, hydroxyzine, ipratropium, omega-3, magnesium oxide, meclizine, menthol-methyl salicylate, montelukast,  nitrofurantoin, oxybutynin, pantoprazole, tizanidine, tramadol, triamcinolone cream, turmeric, gemtesa, vitamin e, vitamin k, and zonisamide.   Allergies: Patient is allergic to meloxicam, nsaids, sulfa antibiotics, cat hair extract, pollen extract, and erythromycin.   Social History: Patient  reports that she has never smoked. She has been exposed to tobacco smoke. She has never used smokeless tobacco. She reports that she does not drink alcohol and does not use drugs.      OBJECTIVE     Physical Exam: Vitals:   09/28/22 1106 09/28/22 1108  BP: (!) 144/84 132/88  Pulse: 79 85   Gen: No apparent distress, A&O x 3.  Detailed Urogynecologic Evaluation:  Normal external genitalia. On speculum, normal vaginal mucosa. On bimanual, no masses present.    POP-Q  0                                            Aa   0                                           Ba  -5                                              C   2                                            Gh  3.5  Pb  7                                            tvl   -1                                            Ap  -1                                            Bp                                                 D      ASSESSMENT AND PLAN    Ms. Gratton is a 67 y.o. with:  1. Prolapse of anterior vaginal wall   2. 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 Overactive bladder    - We reviwed treatment for OAB. She wants to stay on the [facility] for now and focus on prolapse repair. She would be a good candidate for sacral neuromodulation since this treats both OAB and fecal incontinence. She is open to this option but I advised to do one surgery at a time.   Plan for surgery: Exam under anesthesia, robotic assisted sacrocolpopexy, cystoscopy  - We reviewed the patient's specific anatomic and functional findings, with the assistance of  diagrams, and together finalized the above procedure. The planned surgical procedures were discussed along with the surgical risks outlined below, which were also provided on a detailed handout. Additional treatment options including expectant management, conservative management, medical management were discussed where appropriate.  We reviewed the benefits and risks of each treatment option.   General Surgical Risks: For all procedures, there are risks of bleeding, infection, damage to surrounding organs including but not limited to bowel, bladder, blood vessels, ureters and nerves, and need for further surgery if an injury were to occur. These risks are all low with minimally invasive surgery.   There are risks of numbness and weakness at any body site or buttock/rectal pain.  It is possible that baseline pain can be worsened by surgery, either with or without mesh. If surgery is vaginal, there is also a low risk of possible conversion to laparoscopy or open abdominal incision where indicated. Very rare risks include blood transfusion, blood clot, heart attack, pneumonia, or death.   There is also a risk of short-term postoperative urinary retention with need to use a catheter. About half of patients need to go home from surgery with a catheter, which is then later removed in the office. The risk of long-term need for a catheter is very low. There is also a risk of worsening of overactive bladder.    Prolapse (with or without mesh): Risk factors for surgical failure  include things that put pressure on your pelvis and the surgical repair, including obesity, chronic cough, and heavy lifting or straining (including lifting children or adults, straining on the toilet, or lifting heavy objects such as furniture  or anything weighing >25 lbs. Risks of recurrence is 20-30% with vaginal native tissue repair and a less than 10% with sacrocolpopexy with mesh.    Sacrocolpopexy: Mesh implants may provide more  prolapse support, but do have some unique risks to consider. It is important to understand that mesh is permanent and cannot be easily removed. Risks of abdominal sacrocolpopexy mesh include mesh exposure (~3-6%), painful intercourse (recent studies show lower rates after surgery compared to before, with ~5-8% risk of new onset), and very rare risks of bowel or bladder injury or infection (<1%). The risk of mesh exposure is more likely in a woman with risks for poor healing (prior radiation, poorly controlled diabetes, or immunocompromised). The risk of new or worsened chronic pain after mesh implant is more common in women with baseline chronic pain and/or poorly controlled anxiety or depression. There is an FDA safety notification on vaginal mesh procedures for prolapse but NOT abdominal mesh procedures and therefore does not apply to your surgery. We have extensive experience and training with mesh placement and we have close postoperative follow up to identify any potential complications from mesh.    - For preop Visit:  She is required to have a visit within 30 days of her surgery.    - Medical clearance: not required  - Anticoagulant use: No - Medicaid Hysterectomy form: No - Accepts blood transfusion: No - Expected length of stay: outpatient  Request sent for surgery scheduling.   Marguerita Beards, MD

## 2022-09-28 NOTE — Patient Instructions (Signed)
Plan for surgery: Exam under anesthesia, robotic assisted sacrocolpopexy, cystoscopy

## 2022-09-29 NOTE — Telephone Encounter (Signed)
Reached out to patient regarding Xolair and patient assistance program since patient has medicare and affordability issue for same. Will be in touch regarding status after I mail her consent and submit

## 2022-10-10 ENCOUNTER — Other Ambulatory Visit: Payer: Self-pay | Admitting: Obstetrics and Gynecology

## 2022-10-11 ENCOUNTER — Encounter: Payer: Self-pay | Admitting: Internal Medicine

## 2022-10-11 DIAGNOSIS — Z8616 Personal history of COVID-19: Secondary | ICD-10-CM

## 2022-10-11 HISTORY — DX: Personal history of COVID-19: Z86.16

## 2022-10-12 ENCOUNTER — Other Ambulatory Visit: Payer: Self-pay | Admitting: Family Medicine

## 2022-10-12 ENCOUNTER — Encounter: Payer: Self-pay | Admitting: Family Medicine

## 2022-10-12 ENCOUNTER — Telehealth (INDEPENDENT_AMBULATORY_CARE_PROVIDER_SITE_OTHER): Payer: Medicare Other | Admitting: Family Medicine

## 2022-10-12 DIAGNOSIS — J45909 Unspecified asthma, uncomplicated: Secondary | ICD-10-CM

## 2022-10-12 DIAGNOSIS — R059 Cough, unspecified: Secondary | ICD-10-CM

## 2022-10-12 DIAGNOSIS — J4540 Moderate persistent asthma, uncomplicated: Secondary | ICD-10-CM

## 2022-10-12 DIAGNOSIS — U071 COVID-19: Secondary | ICD-10-CM

## 2022-10-12 MED ORDER — PAXLOVID (150/100) 10 X 150 MG & 10 X 100MG PO TBPK
1.0000 | ORAL_TABLET | Freq: Two times a day (BID) | ORAL | 0 refills | Status: AC
Start: 2022-10-12 — End: 2022-10-17

## 2022-10-12 MED ORDER — BENZONATATE 100 MG PO CAPS: 100.0000 mg | ORAL_CAPSULE | Freq: Three times a day (TID) | ORAL | 1 refills | Status: DC | PRN

## 2022-10-12 MED ORDER — PREDNISOLONE 5 MG PO TABS
5.0000 mg | ORAL_TABLET | ORAL | 0 refills | Status: DC
Start: 2022-10-12 — End: 2022-11-08

## 2022-10-12 NOTE — Patient Instructions (Signed)

## 2022-10-12 NOTE — Progress Notes (Signed)
Virtual Visit via video call    This visit type was conducted due to national recommendations for restrictions regarding the COVID-19 Pandemic (e.g. social distancing) in an effort to limit this patient's exposure and mitigate transmission in our community.  Patients identity confirmed using two different identifiers.  This format is felt to be most appropriate for this patient at this time.  All issues noted in this document were discussed and addressed.  No physical exam was performed (except for noted visual exam findings with Video Visits).    Date:  10/18/2022   ID:  Olivia Paul, DOB [birth year], MRN [id number]  Patient Location:  Home  Provider location:   Office    Chief Complaint:  Headache  History of Present Illness:    Olivia Paul is a 67 y.o. female who presents via video conferencing for a telehealth visit today.     Patient tested positive for covid yesterday. She reports feeling terrible. She has a headache, chills,congestion and cough. She states she has been achy all over, patient has a medical diagnosis of Asthma.     Past Medical History:  Diagnosis Date   Arthritis    lt knee   Asthma 2011   Chest pain    Complication of anesthesia    Fatty liver    Headache    sinus   PONV (postoperative nausea and vomiting)    Pre-diabetes    Recurrent upper respiratory infection (URI)    Vertigo    Vitamin D deficiency    Past Surgical History:  Procedure Laterality Date   BREAST BIOPSY     BREAST EXCISIONAL BIOPSY     KNEE ARTHROSCOPY WITH MENISCAL REPAIR Left 02/21/2018   LIPOMA EXCISION     TOTAL KNEE ARTHROPLASTY Left 11/16/2018   Procedure: TOTAL KNEE ARTHROPLASTY;  Surgeon: Jodi Geralds, MD;  Location: [facility];  Service: Orthopedics;  Laterality: Left;   total knee arthroplasty-revision Left 08/31/2020   Dr, Sherilyn Banker   TRIGGER FINGER RELEASE Right 12/25/2020   VAGINAL HYSTERECTOMY     VAGINAL PROLAPSE REPAIR       Current Meds   Medication Sig   albuterol (PROVENTIL) (2.5 MG/3ML) 0.083% nebulizer solution Use 1 vial every 4-6 hours as needed for cough, wheeze, shortness of breath or chest tightness   albuterol (VENTOLIN HFA) 108 (90 Base) MCG/ACT inhaler Inhale 2 puffs into the lungs every 4 (four) hours as needed for wheezing or shortness of breath.   Ascorbic Acid (VITAMIN C) 100 MG tablet Take 100 mg by mouth daily.   atorvastatin (LIPITOR) 20 MG tablet TAKE 1 TABLET BY MOUTH EVERY DAY   benzonatate (TESSALON PERLES) 100 MG capsule Take 1 capsule (100 mg total) by mouth 3 (three) times daily as needed for cough.   BREO ELLIPTA 200-25 MCG/ACT AEPB Inhale 1 puff into the lungs daily.   cetirizine (ZYRTEC) 10 MG tablet Take 1 tablet (10 mg total) by mouth daily.   Cholecalciferol (VITAMIN D) 50 MCG (2000 UT) CAPS Take 5,000 Units by mouth daily.    clobetasol cream (TEMOVATE) 0.05 % Apply topically 2 (TWO) times a day as needed to areas of ras   DULoxetine (CYMBALTA) 60 MG capsule Take 1 capsule (60 mg total) by mouth daily.   EPINEPHrine 0.3 mg/0.3 mL IJ SOAJ injection Inject 0.3 mLs (0.3 mg total) into the muscle as needed for anaphylaxis.   famotidine (PEPCID) 20 MG tablet Take 1 tablet (20 mg total) by mouth 2 (two) times daily.  fexofenadine (ALLEGRA) 180 MG tablet TAKE ONE TABLET ONCE DAILY FOR RUNNY NOSE OR ITCHING   gabapentin (NEURONTIN) 300 MG capsule daily.    hydrOXYzine (ATARAX) 25 MG tablet Take 1 tablet (25 mg total) by mouth at bedtime as needed for itching.   ipratropium (ATROVENT) 0.06 % nasal spray 1-2 sprays each nostril up to 3-4 times a day as needed for nasal drainage or congestion   Krill Oil (OMEGA-3) 500 MG CAPS Take 500 mg by mouth daily.   magnesium oxide (MAG-OX) 400 MG tablet Take 400 mg by mouth daily.   meclizine (ANTIVERT) 50 MG tablet Take 1/2 tab po qpm prn   Menthol-Methyl Salicylate (TIGER BALM LINIMENT EX) Apply 1 application topically at bedtime as needed (pain).   montelukast  (SINGULAIR) 10 MG tablet Take 1 tablet (10 mg total) by mouth at bedtime.   [EXPIRED] nirmatrelvir & ritonavir (PAXLOVID, 150/100,) 10 x 150 MG & 10 x 100MG  TBPK Take 1 tablet by mouth in the morning and at bedtime for 5 days.   nitrofurantoin (MACRODANTIN) 50 MG capsule TAKE 1 CAPSULE BY MOUTH AT BEDTIME.   oxybutynin (DITROPAN-XL) 10 MG 24 hr tablet Take 10 mg by mouth daily.   pantoprazole (PROTONIX) 40 MG tablet TAKE 1 TABLET BY MOUTH EVERY DAY   prednisoLONE 5 MG TABS tablet Take 1 tablet (5 mg total) by mouth as directed.   tiZANidine (ZANAFLEX) 4 MG tablet Take 4 mg by mouth every 8 (eight) hours as needed.   traMADol (ULTRAM) 50 MG tablet Take 50 mg by mouth every 8 (eight) hours as needed for moderate pain.    triamcinolone cream (KENALOG) 0.1 % APPLY TO AFFECTED AREA TWICE DAILY AS NEEDED   TURMERIC PO Take by mouth.   Vibegron (GEMTESA) 75 MG TABS Take 75 tablets by mouth once. Patient reports taking once daily. In the mornings.   vitamin E 400 UNIT capsule Take 400 Units by mouth daily.   vitamin k 100 MCG tablet Take 100 mcg by mouth daily.   zonisamide (ZONEGRAN) 50 MG capsule Take 50 mg by mouth at bedtime.     Allergies:   Meloxicam, Nsaids, Sulfa antibiotics, Cat hair extract, Pollen extract, and Erythromycin   Social History   Tobacco Use   Smoking status: Never    Passive exposure: Past   Smokeless tobacco: Never  Vaping Use   Vaping status: Never Used  Substance Use Topics   Alcohol use: No    Alcohol/week: 0.0 standard drinks of alcohol   Drug use: No     Family Hx: The patient's family history includes Allergic rhinitis in her father and mother; Asthma in her father; Breast cancer in her maternal aunt, paternal aunt, and paternal grandmother; Heart disease in her father; Hypertension in her mother; Kidney disease in her mother; Urticaria in her sister. There is no history of Angioedema, Atopy, Eczema, or Immunodeficiency.  ROS:   Please see the history of  present illness.    Review of Systems  Constitutional:  Positive for chills and malaise/fatigue.  Respiratory:  Positive for cough and shortness of breath.   Gastrointestinal: Negative.   Genitourinary: Negative.   Musculoskeletal: Negative.     All other systems reviewed and are negative.   Labs/Other Tests and Data Reviewed:    Recent Labs: 05/10/2022: ALT 19; BUN 10; Creatinine, Ser 0.80; Hemoglobin 13.5; Platelets 406; Potassium 4.6; Sodium 142   Recent Lipid Panel Lab Results  Component Value Date/Time   CHOL 123 05/10/2022 10:05 AM  TRIG 52 05/10/2022 10:05 AM   HDL 46 05/10/2022 10:05 AM   CHOLHDL 2.7 05/10/2022 10:05 AM   LDLCALC 65 05/10/2022 10:05 AM    Wt Readings from Last 3 Encounters:  09/22/22 199 lb 1.6 oz (90.3 kg)  08/31/22 196 lb (88.9 kg)  06/29/22 193 lb 3.2 oz (87.6 kg)     Exam:    Vital Signs:  There were no vitals taken for this visit.    Physical Exam Vitals reviewed: This is a virtual  visit.  Neurological:     Mental Status: She is alert.     ASSESSMENT & PLAN:        COVID-19 Education: The signs and symptoms of COVID-19 were discussed with the patient and how to seek care for testing (follow up with PCP or arrange E-visit).  The importance of social distancing was discussed today.  Patient Risk:   After full review of this patients clinical status, I feel that they are at least moderate risk at this time.  Time:   Today, I have spent 16 minutes/ seconds with the patient with telehealth technology discussing above diagnoses.     Medication Adjustments/Labs and Tests Ordered: Current medicines are reviewed at length with the patient today.  Concerns regarding medicines are outlined above.        Disposition:  Follow up     Signed,  Ellender Hose FNP-C

## 2022-10-14 ENCOUNTER — Other Ambulatory Visit: Payer: Self-pay

## 2022-10-18 DIAGNOSIS — R059 Cough, unspecified: Secondary | ICD-10-CM | POA: Insufficient documentation

## 2022-10-18 DIAGNOSIS — U071 COVID-19: Secondary | ICD-10-CM | POA: Insufficient documentation

## 2022-10-18 NOTE — Assessment & Plan Note (Signed)
Prescribed Paxlovid and Dose Pak of Prednisone 5mg 

## 2022-10-19 ENCOUNTER — Other Ambulatory Visit: Payer: Self-pay | Admitting: Allergy

## 2022-10-21 NOTE — Addendum Note (Signed)
Addended byMoshe Salisbury, Delpha Perko E on: 10/21/2022 09:30 AM   Modules accepted: Level of Service

## 2022-10-28 ENCOUNTER — Encounter: Payer: Self-pay | Admitting: Obstetrics and Gynecology

## 2022-10-28 ENCOUNTER — Ambulatory Visit (INDEPENDENT_AMBULATORY_CARE_PROVIDER_SITE_OTHER): Payer: Medicare Other | Admitting: Obstetrics and Gynecology

## 2022-10-28 VITALS — BP 126/82 | HR 81 | Wt 197.6 lb

## 2022-10-28 DIAGNOSIS — Z01818 Encounter for other preprocedural examination: Secondary | ICD-10-CM

## 2022-10-28 MED ORDER — DOCUSATE SODIUM 100 MG PO CAPS
100.0000 mg | ORAL_CAPSULE | Freq: Two times a day (BID) | ORAL | 0 refills | Status: DC
Start: 2022-10-28 — End: 2023-05-16

## 2022-10-28 MED ORDER — ACETAMINOPHEN 500 MG PO TABS: 500.0000 mg | ORAL_TABLET | Freq: Four times a day (QID) | ORAL | 0 refills | Status: DC | PRN

## 2022-10-28 MED ORDER — TRAMADOL HCL 50 MG PO TABS
50.0000 mg | ORAL_TABLET | Freq: Three times a day (TID) | ORAL | 0 refills | Status: AC | PRN
Start: 2022-10-28 — End: 2022-11-02

## 2022-10-28 MED ORDER — ONDANSETRON HCL 4 MG PO TABS
4.0000 mg | ORAL_TABLET | Freq: Three times a day (TID) | ORAL | 0 refills | Status: DC | PRN
Start: 2022-10-28 — End: 2023-05-16

## 2022-10-28 NOTE — H&P (Signed)
[facility] H&P  Subjective Chief Complaint: Olivia Paul presents for a preoperative encounter.   History of Present Illness: Olivia Paul is a 67 y.o. female who presents for preoperative visit.  She is scheduled to undergo Exam under anesthesia, robotic assisted sacrocolpopexy, cystoscopy  on 11/08/22.  Her symptoms include Pelvic organ prolapse, and she was was found to have Stage II anterior, Stage I posterior, Stage I apical prolapse.   Urodynamics showed: Urodynamic Impression:  1. Sensation was increased; capacity was reduced 2. Stress Incontinence was not demonstrated at normal pressures; 3. 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 4. Emptying was dysfunctional with a normal PVR, a sustained detrusor contraction not present,  abdominal straining present, dyssynergic urethral sphincter activity on EMG. 5. Low compliance demonstrated with calculated compliance being 17.1  Past Medical History:  Diagnosis Date   Arthritis    lt knee   Asthma 2011   Chest pain    Complication of anesthesia    Fatty liver    Headache    sinus   PONV (postoperative nausea and vomiting)    Pre-diabetes    Recurrent upper respiratory infection (URI)    Vertigo    Vitamin D deficiency      Past Surgical History:  Procedure Laterality Date   BREAST BIOPSY     BREAST EXCISIONAL BIOPSY     KNEE ARTHROSCOPY WITH MENISCAL REPAIR Left 02/21/2018   LIPOMA EXCISION     TOTAL KNEE ARTHROPLASTY Left 11/16/2018   Procedure: TOTAL KNEE ARTHROPLASTY;  Surgeon: Jodi Geralds, MD;  Location: [facility];  Service: Orthopedics;  Laterality: Left;   total knee arthroplasty-revision Left 08/31/2020   Dr, Sherilyn Banker   TRIGGER FINGER RELEASE Right 12/25/2020   VAGINAL HYSTERECTOMY     VAGINAL PROLAPSE REPAIR      is allergic to meloxicam, nsaids, sulfa antibiotics, cat hair extract, pollen extract, and erythromycin.   Family History  Problem Relation Age of  Onset   Kidney disease Mother    Hypertension Mother    Allergic rhinitis Mother    Heart disease Father    Allergic rhinitis Father    Asthma Father    Urticaria Sister    Breast cancer Paternal Grandmother    Breast cancer Maternal Aunt    Breast cancer Paternal Aunt    Angioedema Neg Hx    Atopy Neg Hx    Eczema Neg Hx    Immunodeficiency Neg Hx     Social History   Tobacco Use   Smoking status: Never    Passive exposure: Past   Smokeless tobacco: Never  Vaping Use   Vaping status: Never Used  Substance Use Topics   Alcohol use: No    Alcohol/week: 0.0 standard drinks of alcohol   Drug use: No     Review of Systems was negative for a full 10 system review except as noted in the History of Present Illness.  No current facility-administered medications for this encounter.  Current Outpatient Medications:    albuterol (PROVENTIL) (2.5 MG/3ML) 0.083% nebulizer solution, Use 1 vial every 4-6 hours as needed for cough, wheeze, shortness of breath or chest tightness, Disp: 150 mL, Rfl: 0   albuterol (VENTOLIN HFA) 108 (90 Base) MCG/ACT inhaler, Inhale 2 puffs into the lungs every 4 (four) hours as needed for wheezing or shortness of breath., Disp: 18 g, Rfl: 1   Ascorbic Acid (VITAMIN C) 100 MG tablet, Take 100 mg by mouth daily., Disp: , Rfl:  atorvastatin (LIPITOR) 20 MG tablet, TAKE 1 TABLET BY MOUTH EVERY DAY, Disp: 90 tablet, Rfl: 1   benzonatate (TESSALON PERLES) 100 MG capsule, Take 1 capsule (100 mg total) by mouth 3 (three) times daily as needed for cough., Disp: 30 capsule, Rfl: 1   BREO ELLIPTA 200-25 MCG/ACT AEPB, Inhale 1 puff into the lungs daily., Disp: 60 each, Rfl: 5   cetirizine (ZYRTEC) 10 MG tablet, Take 1 tablet (10 mg total) by mouth daily., Disp: 30 tablet, Rfl: 5   Cholecalciferol (VITAMIN D) 50 MCG (2000 UT) CAPS, Take 5,000 Units by mouth daily. , Disp: , Rfl:    clobetasol cream (TEMOVATE) 0.05 %, Apply topically 2 (TWO) times a day as needed to  areas of ras, Disp: 60 g, Rfl: 1   DULoxetine (CYMBALTA) 60 MG capsule, Take 1 capsule (60 mg total) by mouth daily., Disp: 90 capsule, Rfl: 2   EPINEPHrine 0.3 mg/0.3 mL IJ SOAJ injection, Inject 0.3 mLs (0.3 mg total) into the muscle as needed for anaphylaxis., Disp: 1 each, Rfl: 1   famotidine (PEPCID) 20 MG tablet, Take 1 tablet (20 mg total) by mouth 2 (two) times daily., Disp: 30 tablet, Rfl: 5   fexofenadine (ALLEGRA) 180 MG tablet, TAKE ONE TABLET ONCE DAILY FOR RUNNY NOSE OR ITCHING, Disp: 90 tablet, Rfl: 1   gabapentin (NEURONTIN) 300 MG capsule, daily. , Disp: , Rfl:    hydrOXYzine (ATARAX) 25 MG tablet, TAKE 1 TABLET BY MOUTH AT BEDTIME AS NEEDED FOR ITCHING., Disp: 30 tablet, Rfl: 0   ipratropium (ATROVENT) 0.06 % nasal spray, 1-2 sprays each nostril up to 3-4 times a day as needed for nasal drainage or congestion, Disp: 15 mL, Rfl: 5   Krill Oil (OMEGA-3) 500 MG CAPS, Take 500 mg by mouth daily., Disp: , Rfl:    magnesium oxide (MAG-OX) 400 MG tablet, Take 400 mg by mouth daily., Disp: , Rfl:    meclizine (ANTIVERT) 50 MG tablet, Take 1/2 tab po qpm prn, Disp: 30 tablet, Rfl: 0   Menthol-Methyl Salicylate (TIGER BALM LINIMENT EX), Apply 1 application topically at bedtime as needed (pain)., Disp: , Rfl:    montelukast (SINGULAIR) 10 MG tablet, Take 1 tablet (10 mg total) by mouth at bedtime., Disp: 30 tablet, Rfl: 5   nitrofurantoin (MACRODANTIN) 50 MG capsule, TAKE 1 CAPSULE BY MOUTH AT BEDTIME., Disp: 30 capsule, Rfl: 0   pantoprazole (PROTONIX) 40 MG tablet, TAKE 1 TABLET BY MOUTH EVERY DAY, Disp: 90 tablet, Rfl: 1   prednisoLONE 5 MG TABS tablet, Take 1 tablet (5 mg total) by mouth as directed., Disp: 21 tablet, Rfl: 0   tiZANidine (ZANAFLEX) 4 MG tablet, Take 4 mg by mouth every 8 (eight) hours as needed., Disp: , Rfl:    traMADol (ULTRAM) 50 MG tablet, Take 50 mg by mouth every 8 (eight) hours as needed for moderate pain. , Disp: , Rfl:    triamcinolone cream (KENALOG) 0.1 %,  APPLY TO AFFECTED AREA TWICE DAILY AS NEEDED, Disp: 453.6 g, Rfl: 1   TURMERIC PO, Take by mouth., Disp: , Rfl:    Vibegron (GEMTESA) 75 MG TABS, Take 75 tablets by mouth once. Patient reports taking once daily. In the mornings., Disp: , Rfl:    vitamin E 400 UNIT capsule, Take 400 Units by mouth daily., Disp: , Rfl:    vitamin k 100 MCG tablet, Take 100 mcg by mouth daily., Disp: , Rfl:    zonisamide (ZONEGRAN) 50 MG capsule, Take 50 mg by  mouth at bedtime. Pt said she is taking 100mg , Disp: , Rfl:    Objective There were no vitals filed for this visit.   Gen: NAD CV: S1 S2 RRR Lungs: Clear to auscultation bilaterally Abd: soft, nontender   Previous Pelvic Exam showed: Normal external genitalia. On speculum, normal vaginal mucosa. On bimanual, no masses present.      POP-Q   0                                            Aa   0                                           Ba   -5                                              C    2                                            Gh   3.5                                            Pb   7                                            tvl    -1                                            Ap   -1                                            Bp                                          Assessment/ Plan  Assessment: The patient is a 67 y.o. year old scheduled to undergo Exam under anesthesia, robotic assisted sacrocolpopexy, cystoscopy . Verbal consent was obtained for these procedures.

## 2022-10-28 NOTE — Progress Notes (Addendum)
[facility]ne[facility] Pre-Operative Exam  Subjective Chief Complaint: Olivia Paul presents for a preoperative encounter.   History of Present Illness: Olivia Paul is a 67 y.o. female who presents for preoperative visit.  She is scheduled to undergo Exam under anesthesia, robotic assisted sacrocolpopexy, cystoscopy  on 11/08/22.  Her symptoms include Pelvic organ prolapse, and she was was found to have Stage II anterior, Stage I posterior, Stage I apical prolapse.   Urodynamics showed: Urodynamic Impression:  1. Sensation was increased; capacity was reduced 2. Stress Incontinence was not demonstrated at normal pressures; 3. 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 4. Emptying was dysfunctional with a normal PVR, a sustained detrusor contraction not present,  abdominal straining present, dyssynergic urethral sphincter activity on EMG. 5. Low compliance demonstrated with calculated compliance being 17.1  Past Medical History:  Diagnosis Date   Arthritis    lt knee   Asthma 2011   Chest pain    Complication of anesthesia    Fatty liver    Headache    sinus   PONV (postoperative nausea and vomiting)    Pre-diabetes    Recurrent upper respiratory infection (URI)    Vertigo    Vitamin D deficiency      Past Surgical History:  Procedure Laterality Date   BREAST BIOPSY     BREAST EXCISIONAL BIOPSY     KNEE ARTHROSCOPY WITH MENISCAL REPAIR Left 02/21/2018   LIPOMA EXCISION     TOTAL KNEE ARTHROPLASTY Left 11/16/2018   Procedure: TOTAL KNEE ARTHROPLASTY;  Surgeon: Jodi Geralds, MD;  Location: [facility];  Service: Orthopedics;  Laterality: Left;   total knee arthroplasty-revision Left 08/31/2020   Dr, Sherilyn Banker   TRIGGER FINGER RELEASE Right 12/25/2020   VAGINAL HYSTERECTOMY     VAGINAL PROLAPSE REPAIR      is allergic to meloxicam, nsaids, sulfa antibiotics, cat hair extract, pollen extract, and erythromycin.   Family History  Problem  Relation Age of Onset   Kidney disease Mother    Hypertension Mother    Allergic rhinitis Mother    Heart disease Father    Allergic rhinitis Father    Asthma Father    Urticaria Sister    Breast cancer Paternal Grandmother    Breast cancer Maternal Aunt    Breast cancer Paternal Aunt    Angioedema Neg Hx    Atopy Neg Hx    Eczema Neg Hx    Immunodeficiency Neg Hx     Social History   Tobacco Use   Smoking status: Never    Passive exposure: Past   Smokeless tobacco: Never  Vaping Use   Vaping status: Never Used  Substance Use Topics   Alcohol use: No    Alcohol/week: 0.0 standard drinks of alcohol   Drug use: No     Review of Systems was negative for a full 10 system review except as noted in the History of Present Illness.   Current Outpatient Medications:    albuterol (PROVENTIL) (2.5 MG/3ML) 0.083% nebulizer solution, Use 1 vial every 4-6 hours as needed for cough, wheeze, shortness of breath or chest tightness, Disp: 150 mL, Rfl: 0   albuterol (VENTOLIN HFA) 108 (90 Base) MCG/ACT inhaler, Inhale 2 puffs into the lungs every 4 (four) hours as needed for wheezing or shortness of breath., Disp: 18 g, Rfl: 1   Ascorbic Acid (VITAMIN C) 100 MG tablet, Take 100 mg by mouth daily., Disp: , Rfl:    atorvastatin (LIPITOR) 20 MG tablet,  TAKE 1 TABLET BY MOUTH EVERY DAY, Disp: 90 tablet, Rfl: 1   benzonatate (TESSALON PERLES) 100 MG capsule, Take 1 capsule (100 mg total) by mouth 3 (three) times daily as needed for cough., Disp: 30 capsule, Rfl: 1   BREO ELLIPTA 200-25 MCG/ACT AEPB, Inhale 1 puff into the lungs daily., Disp: 60 each, Rfl: 5   cetirizine (ZYRTEC) 10 MG tablet, Take 1 tablet (10 mg total) by mouth daily., Disp: 30 tablet, Rfl: 5   Cholecalciferol (VITAMIN D) 50 MCG (2000 UT) CAPS, Take 5,000 Units by mouth daily. , Disp: , Rfl:    clobetasol cream (TEMOVATE) 0.05 %, Apply topically 2 (TWO) times a day as needed to areas of ras, Disp: 60 g, Rfl: 1   DULoxetine  (CYMBALTA) 60 MG capsule, Take 1 capsule (60 mg total) by mouth daily., Disp: 90 capsule, Rfl: 2   EPINEPHrine 0.3 mg/0.3 mL IJ SOAJ injection, Inject 0.3 mLs (0.3 mg total) into the muscle as needed for anaphylaxis., Disp: 1 each, Rfl: 1   famotidine (PEPCID) 20 MG tablet, Take 1 tablet (20 mg total) by mouth 2 (two) times daily., Disp: 30 tablet, Rfl: 5   fexofenadine (ALLEGRA) 180 MG tablet, TAKE ONE TABLET ONCE DAILY FOR RUNNY NOSE OR ITCHING, Disp: 90 tablet, Rfl: 1   gabapentin (NEURONTIN) 300 MG capsule, daily. , Disp: , Rfl:    hydrOXYzine (ATARAX) 25 MG tablet, TAKE 1 TABLET BY MOUTH AT BEDTIME AS NEEDED FOR ITCHING., Disp: 30 tablet, Rfl: 0   ipratropium (ATROVENT) 0.06 % nasal spray, 1-2 sprays each nostril up to 3-4 times a day as needed for nasal drainage or congestion, Disp: 15 mL, Rfl: 5   Krill Oil (OMEGA-3) 500 MG CAPS, Take 500 mg by mouth daily., Disp: , Rfl:    magnesium oxide (MAG-OX) 400 MG tablet, Take 400 mg by mouth daily., Disp: , Rfl:    meclizine (ANTIVERT) 50 MG tablet, Take 1/2 tab po qpm prn, Disp: 30 tablet, Rfl: 0   Menthol-Methyl Salicylate (TIGER BALM LINIMENT EX), Apply 1 application topically at bedtime as needed (pain)., Disp: , Rfl:    montelukast (SINGULAIR) 10 MG tablet, Take 1 tablet (10 mg total) by mouth at bedtime., Disp: 30 tablet, Rfl: 5   nitrofurantoin (MACRODANTIN) 50 MG capsule, TAKE 1 CAPSULE BY MOUTH AT BEDTIME., Disp: 30 capsule, Rfl: 0   pantoprazole (PROTONIX) 40 MG tablet, TAKE 1 TABLET BY MOUTH EVERY DAY, Disp: 90 tablet, Rfl: 1   prednisoLONE 5 MG TABS tablet, Take 1 tablet (5 mg total) by mouth as directed., Disp: 21 tablet, Rfl: 0   tiZANidine (ZANAFLEX) 4 MG tablet, Take 4 mg by mouth every 8 (eight) hours as needed., Disp: , Rfl:    traMADol (ULTRAM) 50 MG tablet, Take 50 mg by mouth every 8 (eight) hours as needed for moderate pain. , Disp: , Rfl:    triamcinolone cream (KENALOG) 0.1 %, APPLY TO AFFECTED AREA TWICE DAILY AS NEEDED,  Disp: 453.6 g, Rfl: 1   TURMERIC PO, Take by mouth., Disp: , Rfl:    Vibegron (GEMTESA) 75 MG TABS, Take 75 tablets by mouth once. Patient reports taking once daily. In the mornings., Disp: , Rfl:    vitamin E 400 UNIT capsule, Take 400 Units by mouth daily., Disp: , Rfl:    vitamin k 100 MCG tablet, Take 100 mcg by mouth daily., Disp: , Rfl:    zonisamide (ZONEGRAN) 50 MG capsule, Take 50 mg by mouth at bedtime. Pt said  she is taking 100mg , Disp: , Rfl:    Objective Vitals:   10/28/22 0937  BP: 126/82  Pulse: 81    Gen: NAD CV: S1 S2 RRR Lungs: Clear to auscultation bilaterally Abd: soft, nontender   Previous Pelvic Exam showed: Normal external genitalia. On speculum, normal vaginal mucosa. On bimanual, no masses present.      POP-Q   0                                            Aa   0                                           Ba   -5                                              C    2                                            Gh   3.5                                            Pb   7                                            tvl    -1                                            Ap   -1                                            Bp                                          Assessment/ Plan  Assessment: The patient is a 67 y.o. year old scheduled to undergo Exam under anesthesia, robotic assisted sacrocolpopexy, cystoscopy . Verbal consent was obtained for these procedures.  Plan: General Surgical Consent: The patient has previously been counseled on alternative treatments, and the decision by the patient and provider was to proceed with the procedure listed above.  For all procedures, there are risks of bleeding, infection, damage to surrounding organs including but not limited to bowel, bladder, blood vessels, ureters and nerves, and need for further surgery if an injury were to occur. These risks are all low with minimally invasive surgery.   There are  risks of numbness and weakness  at any body site or buttock/rectal pain.  It is possible that baseline pain can be worsened by surgery, either with or without mesh. If surgery is vaginal, there is also a low risk of possible conversion to laparoscopy or open abdominal incision where indicated. Very rare risks include blood transfusion, blood clot, heart attack, pneumonia, or death.   There is also a risk of short-term postoperative urinary retention with need to use a catheter. About half of patients need to go home from surgery with a catheter, which is then later removed in the office. The risk of long-term need for a catheter is very low. There is also a risk of worsening of overactive bladder.     Prolapse (with or without mesh): Risk factors for surgical failure  include things that put pressure on your pelvis and the surgical repair, including obesity, chronic cough, and heavy lifting or straining (including lifting children or adults, straining on the toilet, or lifting heavy objects such as furniture or anything weighing >25 lbs. Risks of recurrence is 20-30% with vaginal native tissue repair and a less than 10% with sacrocolpopexy with mesh.    Sacrocolpopexy: Mesh implants may provide more prolapse support, but do have some unique risks to consider. It is important to understand that mesh is permanent and cannot be easily removed. Risks of abdominal sacrocolpopexy mesh include mesh exposure (~3-6%), painful intercourse (recent studies show lower rates after surgery compared to before, with ~5-8% risk of new onset), and very rare risks of bowel or bladder injury or infection (<1%). The risk of mesh exposure is more likely in a woman with risks for poor healing (prior radiation, poorly controlled diabetes, or immunocompromised). The risk of new or worsened chronic pain after mesh implant is more common in women with baseline chronic pain and/or poorly controlled anxiety or depression. There is an FDA  safety notification on vaginal mesh procedures for prolapse but NOT abdominal mesh procedures and therefore does not apply to your surgery. We have extensive experience and training with mesh placement and we have close postoperative follow up to identify any potential complications from mesh.    We discussed consent for blood products. Risks for blood transfusion include allergic reactions, other reactions that can affect different body organs and managed accordingly, transmission of infectious diseases such as HIV or Hepatitis. However, the blood is screened. Patient consents for blood products.  Pre-operative instructions:  She was instructed to not take Aspirin/NSAIDs x 7days prior to surgery.  Antibiotic prophylaxis was ordered as indicated.  Catheter use: Patient will go home with foley if needed after post-operative voiding trial.  Post-operative instructions:  She was provided with specific post-operative instructions, including precautions and signs/symptoms for which we would recommend contacting us, in addition to daytime and after-hours contact phone numbers. This was provided on a handout.   Post-operative medications: Prescriptions for tylenol, miralax, and oxycodone were sent to her pharmacy. Discussed using ibuprofen and tylenol on a schedule to limit use of narcotics.   Laboratory testing:  We will check labs: As requested by anesthesia  Preoperative clearance:  She does not require surgical clearance.    Post-operative follow-up:  A post-operative appointment will be made for 6 weeks from the date of surgery. If she needs a post-operative nurse visit for a voiding trial, that will be set up after she leaves the hospital.    Patient will call the clinic or use MyChart should anything change or any new issues arise.  Caprini Score 5-will  order Lovenox with pre op orders  Selmer Dominion, NP

## 2022-10-28 NOTE — Addendum Note (Signed)
Addended by: Selmer Dominion on: 10/28/2022 10:54 AM   Modules accepted: Orders

## 2022-11-02 ENCOUNTER — Encounter (HOSPITAL_BASED_OUTPATIENT_CLINIC_OR_DEPARTMENT_OTHER): Payer: Self-pay | Admitting: Obstetrics and Gynecology

## 2022-11-02 ENCOUNTER — Other Ambulatory Visit: Payer: Self-pay

## 2022-11-02 NOTE — Progress Notes (Signed)
Spoke w/ via phone for pre-op interview---pt Lab needs dos---- none        Lab results------lov allergy asthma christine dale fnp 06-20-2022 epic COVID test -----patient states asymptomatic no test needed Arrive at -------900 11-08-2022 NPO after MN NO Solid Food.  Clear liquids from MN until---800 Med rec completed Medications to take morning of surgery -----gemtesa, albuterol inhaler prn/bring inhaler, albuterol nebulizer, breo ellipta inhaler Diabetic medication -----n/a Patient instructed no nail polish to be worn day of surgery Patient instructed to bring photo id and insurance card day of surgery Patient aware to have Driver (ride ) / caregiver   Olivia Paul son   for 24 hours after surgery -  Patient Special Instructions -----bring cpap mask tubing and machine and leave in car Pre-Op special Instructions -----n/a Patient verbalized understanding of instructions that were given at this phone interview. Patient denies chest pain, sob, fever, cough at the interview.

## 2022-11-07 NOTE — Progress Notes (Signed)
Pt was aware to be here at 0530

## 2022-11-07 NOTE — Anesthesia Preprocedure Evaluation (Addendum)
Anesthesia Evaluation  Patient identified by MRN, date of birth, ID band Patient awake    Reviewed: Allergy & Precautions, NPO status , Patient's Chart, lab work & pertinent test results  History of Anesthesia Complications (+) PONV and history of anesthetic complications  Airway Mallampati: II  TM Distance: >3 FB Neck ROM: Full    Dental no notable dental hx. (+) Teeth Intact, Dental Advisory Given   Pulmonary asthma , sleep apnea and Continuous Positive Airway Pressure Ventilation    Pulmonary exam normal breath sounds clear to auscultation       Cardiovascular negative cardio ROS Normal cardiovascular exam Rhythm:Regular Rate:Normal     Neuro/Psych  Headaches PSYCHIATRIC DISORDERS Anxiety        GI/Hepatic Neg liver ROS,GERD  ,,  Endo/Other    Morbid obesity (BMI 40)  Renal/GU negative Renal ROS  negative genitourinary   Musculoskeletal  (+) Arthritis ,    Abdominal   Peds  Hematology negative hematology ROS (+)   Anesthesia Other Findings   Reproductive/Obstetrics                             Anesthesia Physical Anesthesia Plan  ASA: 3  Anesthesia Plan: General   Post-op Pain Management: Tylenol PO (pre-op)* and Ketamine IV*   Induction: Intravenous  PONV Risk Score and Plan: 4 or greater and Midazolam, Dexamethasone, Ondansetron and Scopolamine patch - Pre-op  Airway Management Planned: Oral ETT  Additional Equipment:   Intra-op Plan:   Post-operative Plan: Extubation in OR  Informed Consent: I have reviewed the patients History and Physical, chart, labs and discussed the procedure including the risks, benefits and alternatives for the proposed anesthesia with the patient or authorized representative who has indicated his/her understanding and acceptance.     Dental advisory given  Plan Discussed with: CRNA  Anesthesia Plan Comments: (2 IVs)       Anesthesia  Quick Evaluation

## 2022-11-08 ENCOUNTER — Ambulatory Visit (HOSPITAL_BASED_OUTPATIENT_CLINIC_OR_DEPARTMENT_OTHER): Payer: Medicare Other | Admitting: Anesthesiology

## 2022-11-08 ENCOUNTER — Other Ambulatory Visit: Payer: Self-pay

## 2022-11-08 ENCOUNTER — Encounter (HOSPITAL_BASED_OUTPATIENT_CLINIC_OR_DEPARTMENT_OTHER): Payer: Self-pay | Admitting: Obstetrics and Gynecology

## 2022-11-08 ENCOUNTER — Ambulatory Visit (HOSPITAL_BASED_OUTPATIENT_CLINIC_OR_DEPARTMENT_OTHER)
Admission: RE | Admit: 2022-11-08 | Discharge: 2022-11-08 | Disposition: A | Discharge status: Home / Self Care | Payer: Medicare Other | Attending: Obstetrics and Gynecology | Admitting: Obstetrics and Gynecology

## 2022-11-08 ENCOUNTER — Telehealth: Payer: Self-pay | Admitting: Obstetrics and Gynecology

## 2022-11-08 ENCOUNTER — Encounter (HOSPITAL_BASED_OUTPATIENT_CLINIC_OR_DEPARTMENT_OTHER)
Admission: RE | Disposition: A | Discharge status: Home / Self Care | Payer: Self-pay | Source: Home / Self Care | Attending: Obstetrics and Gynecology

## 2022-11-08 DIAGNOSIS — F419 Anxiety disorder, unspecified: Secondary | ICD-10-CM | POA: Insufficient documentation

## 2022-11-08 DIAGNOSIS — J45909 Unspecified asthma, uncomplicated: Secondary | ICD-10-CM | POA: Insufficient documentation

## 2022-11-08 DIAGNOSIS — N814 Uterovaginal prolapse, unspecified: Secondary | ICD-10-CM

## 2022-11-08 DIAGNOSIS — Z6841 Body Mass Index (BMI) 40.0 and over, adult: Secondary | ICD-10-CM | POA: Diagnosis not present

## 2022-11-08 DIAGNOSIS — G4730 Sleep apnea, unspecified: Secondary | ICD-10-CM | POA: Diagnosis not present

## 2022-11-08 DIAGNOSIS — N83291 Other ovarian cyst, right side: Secondary | ICD-10-CM | POA: Diagnosis not present

## 2022-11-08 DIAGNOSIS — K219 Gastro-esophageal reflux disease without esophagitis: Secondary | ICD-10-CM | POA: Insufficient documentation

## 2022-11-08 DIAGNOSIS — N993 Prolapse of vaginal vault after hysterectomy: Secondary | ICD-10-CM | POA: Diagnosis not present

## 2022-11-08 DIAGNOSIS — D270 Benign neoplasm of right ovary: Secondary | ICD-10-CM | POA: Diagnosis not present

## 2022-11-08 DIAGNOSIS — Z01818 Encounter for other preprocedural examination: Secondary | ICD-10-CM

## 2022-11-08 HISTORY — DX: Sleep apnea, unspecified: G47.30

## 2022-11-08 HISTORY — PX: XI ROBOTIC ASSISTED OOPHORECTOMY: SHX6823

## 2022-11-08 HISTORY — DX: Gastro-esophageal reflux disease without esophagitis: K21.9

## 2022-11-08 HISTORY — PX: ROBOTIC ASSISTED LAPAROSCOPIC SACROCOLPOPEXY: SHX5388

## 2022-11-08 HISTORY — DX: Anxiety disorder, unspecified: F41.9

## 2022-11-08 HISTORY — DX: Presence of spectacles and contact lenses: Z97.3

## 2022-11-08 HISTORY — PX: CYSTOSCOPY: SHX5120

## 2022-11-08 LAB — GLUCOSE, CAPILLARY: Glucose-Capillary: 125 mg/dL — ABNORMAL HIGH (ref 70–99)

## 2022-11-08 SURGERY — SACROCOLPOPEXY, ROBOT-ASSISTED, LAPAROSCOPIC
Anesthesia: General | Site: Pelvis | Laterality: Right

## 2022-11-08 MED ORDER — HYDROMORPHONE HCL 2 MG/ML IJ SOLN
INTRAMUSCULAR | Status: AC
  Filled 2022-11-08: qty 1

## 2022-11-08 MED ORDER — STERILE WATER FOR IRRIGATION IR SOLN
Status: DC | PRN
  Administered 2022-11-08: 500 mL

## 2022-11-08 MED ORDER — KETAMINE HCL 50 MG/5ML IJ SOSY
PREFILLED_SYRINGE | INTRAMUSCULAR | Status: AC
  Filled 2022-11-08: qty 5

## 2022-11-08 MED ORDER — SIMETHICONE 80 MG PO CHEW
80.0000 mg | CHEWABLE_TABLET | Freq: Four times a day (QID) | ORAL | Status: DC | PRN
  Administered 2022-11-08: 80 mg via ORAL

## 2022-11-08 MED ORDER — ACETAMINOPHEN 325 MG PO TABS
ORAL_TABLET | ORAL | Status: AC
  Filled 2022-11-08: qty 2

## 2022-11-08 MED ORDER — MIDAZOLAM HCL 2 MG/2ML IJ SOLN
INTRAMUSCULAR | Status: DC | PRN
  Administered 2022-11-08: 2 mg via INTRAVENOUS

## 2022-11-08 MED ORDER — ACETAMINOPHEN 500 MG PO TABS
ORAL_TABLET | ORAL | Status: AC
  Filled 2022-11-08: qty 2

## 2022-11-08 MED ORDER — LACTATED RINGERS IV SOLN: INTRAVENOUS | Status: DC

## 2022-11-08 MED ORDER — LIDOCAINE HCL (PF) 2 % IJ SOLN
INTRAMUSCULAR | Status: AC
  Filled 2022-11-08: qty 5

## 2022-11-08 MED ORDER — BUPIVACAINE HCL (PF) 0.25 % IJ SOLN
INTRAMUSCULAR | Status: DC | PRN
  Administered 2022-11-08: 12 mL

## 2022-11-08 MED ORDER — POLYETHYLENE GLYCOL 3350 17 G PO PACK: 17.0000 g | PACK | Freq: Every day | ORAL | Status: DC | PRN

## 2022-11-08 MED ORDER — HYDROMORPHONE HCL 1 MG/ML IJ SOLN
INTRAMUSCULAR | Status: DC | PRN
Start: 2022-11-08 — End: 2022-11-08
  Administered 2022-11-08: .5 mg via INTRAVENOUS

## 2022-11-08 MED ORDER — ONDANSETRON HCL 4 MG PO TABS: 4.0000 mg | ORAL_TABLET | Freq: Four times a day (QID) | ORAL | Status: DC | PRN

## 2022-11-08 MED ORDER — SODIUM CHLORIDE 0.9 % IR SOLN
Status: DC | PRN
  Administered 2022-11-08: 1000 mL via INTRAVESICAL

## 2022-11-08 MED ORDER — GABAPENTIN 300 MG PO CAPS
ORAL_CAPSULE | ORAL | Status: AC
  Filled 2022-11-08: qty 1

## 2022-11-08 MED ORDER — LIDOCAINE 2% (20 MG/ML) 5 ML SYRINGE
INTRAMUSCULAR | Status: DC | PRN
  Administered 2022-11-08: 80 mg via INTRAVENOUS

## 2022-11-08 MED ORDER — ACETAMINOPHEN 325 MG PO TABS
650.0000 mg | ORAL_TABLET | ORAL | Status: DC | PRN
  Administered 2022-11-08: 650 mg via ORAL

## 2022-11-08 MED ORDER — DIPHENHYDRAMINE HCL 50 MG/ML IJ SOLN
INTRAMUSCULAR | Status: AC
  Filled 2022-11-08: qty 1

## 2022-11-08 MED ORDER — ACETAMINOPHEN 500 MG PO TABS: 1000.0000 mg | ORAL_TABLET | ORAL | Status: DC

## 2022-11-08 MED ORDER — DEXAMETHASONE SODIUM PHOSPHATE 10 MG/ML IJ SOLN
INTRAMUSCULAR | Status: DC | PRN
  Administered 2022-11-08: 10 mg via INTRAVENOUS

## 2022-11-08 MED ORDER — SIMETHICONE 80 MG PO CHEW
CHEWABLE_TABLET | ORAL | Status: AC
  Filled 2022-11-08: qty 1

## 2022-11-08 MED ORDER — PHENAZOPYRIDINE HCL 100 MG PO TABS
ORAL_TABLET | ORAL | Status: AC
  Filled 2022-11-08: qty 2

## 2022-11-08 MED ORDER — ONDANSETRON HCL 4 MG/2ML IJ SOLN: 4.0000 mg | Freq: Four times a day (QID) | INTRAMUSCULAR | Status: DC | PRN

## 2022-11-08 MED ORDER — ROCURONIUM BROMIDE 10 MG/ML (PF) SYRINGE
PREFILLED_SYRINGE | INTRAVENOUS | Status: DC | PRN
  Administered 2022-11-08: 20 mg via INTRAVENOUS
  Administered 2022-11-08: 10 mg via INTRAVENOUS
  Administered 2022-11-08: 50 mg via INTRAVENOUS

## 2022-11-08 MED ORDER — PHENYLEPHRINE 80 MCG/ML (10ML) SYRINGE FOR IV PUSH (FOR BLOOD PRESSURE SUPPORT)
PREFILLED_SYRINGE | INTRAVENOUS | Status: DC | PRN
  Administered 2022-11-08: 160 ug via INTRAVENOUS

## 2022-11-08 MED ORDER — ENOXAPARIN SODIUM 40 MG/0.4ML IJ SOSY
40.0000 mg | PREFILLED_SYRINGE | INTRAMUSCULAR | Status: AC
  Administered 2022-11-08: 40 mg via SUBCUTANEOUS

## 2022-11-08 MED ORDER — ACETAMINOPHEN 500 MG PO TABS: 1000.0000 mg | ORAL_TABLET | Freq: Once | ORAL | Status: AC

## 2022-11-08 MED ORDER — PHENAZOPYRIDINE HCL 100 MG PO TABS: 200.0000 mg | ORAL_TABLET | ORAL | Status: DC

## 2022-11-08 MED ORDER — FENTANYL CITRATE (PF) 100 MCG/2ML IJ SOLN: 25.0000 ug | INTRAMUSCULAR | Status: DC | PRN

## 2022-11-08 MED ORDER — EPHEDRINE SULFATE-NACL 50-0.9 MG/10ML-% IV SOSY
PREFILLED_SYRINGE | INTRAVENOUS | Status: DC | PRN
  Administered 2022-11-08 (×2): 5 mg via INTRAVENOUS

## 2022-11-08 MED ORDER — CEFAZOLIN SODIUM-DEXTROSE 2-4 GM/100ML-% IV SOLN
2.0000 g | INTRAVENOUS | Status: AC
  Administered 2022-11-08: 2 g via INTRAVENOUS

## 2022-11-08 MED ORDER — ROCURONIUM BROMIDE 10 MG/ML (PF) SYRINGE
PREFILLED_SYRINGE | INTRAVENOUS | Status: AC
  Filled 2022-11-08: qty 10

## 2022-11-08 MED ORDER — SUGAMMADEX SODIUM 200 MG/2ML IV SOLN
INTRAVENOUS | Status: DC | PRN
  Administered 2022-11-08: 200 mg via INTRAVENOUS

## 2022-11-08 MED ORDER — CEFAZOLIN SODIUM-DEXTROSE 2-4 GM/100ML-% IV SOLN
INTRAVENOUS | Status: AC
  Filled 2022-11-08: qty 100

## 2022-11-08 MED ORDER — PROPOFOL 10 MG/ML IV BOLUS
INTRAVENOUS | Status: DC | PRN
  Administered 2022-11-08: 100 mg via INTRAVENOUS

## 2022-11-08 MED ORDER — FENTANYL CITRATE (PF) 250 MCG/5ML IJ SOLN
INTRAMUSCULAR | Status: DC | PRN
  Administered 2022-11-08 (×2): 50 ug via INTRAVENOUS
  Administered 2022-11-08 (×2): 25 ug via INTRAVENOUS
  Administered 2022-11-08: 50 ug via INTRAVENOUS

## 2022-11-08 MED ORDER — DEXAMETHASONE SODIUM PHOSPHATE 10 MG/ML IJ SOLN
INTRAMUSCULAR | Status: AC
  Filled 2022-11-08: qty 1

## 2022-11-08 MED ORDER — GABAPENTIN 300 MG PO CAPS: 300.0000 mg | ORAL_CAPSULE | ORAL | Status: DC

## 2022-11-08 MED ORDER — ONDANSETRON HCL 4 MG/2ML IJ SOLN
INTRAMUSCULAR | Status: DC | PRN
  Administered 2022-11-08: 4 mg via INTRAVENOUS

## 2022-11-08 MED ORDER — FENTANYL CITRATE (PF) 250 MCG/5ML IJ SOLN
INTRAMUSCULAR | Status: AC
  Filled 2022-11-08: qty 5

## 2022-11-08 MED ORDER — PHENYLEPHRINE 80 MCG/ML (10ML) SYRINGE FOR IV PUSH (FOR BLOOD PRESSURE SUPPORT)
PREFILLED_SYRINGE | INTRAVENOUS | Status: AC
  Filled 2022-11-08: qty 10

## 2022-11-08 MED ORDER — ONDANSETRON HCL 4 MG/2ML IJ SOLN
INTRAMUSCULAR | Status: AC
  Filled 2022-11-08: qty 2

## 2022-11-08 MED ORDER — KETAMINE HCL 10 MG/ML IJ SOLN
INTRAMUSCULAR | Status: DC | PRN
Start: 2022-11-08 — End: 2022-11-08
  Administered 2022-11-08: 30 mg via INTRAVENOUS

## 2022-11-08 MED ORDER — DIPHENHYDRAMINE HCL 50 MG/ML IJ SOLN
INTRAMUSCULAR | Status: DC | PRN
Start: 2022-11-08 — End: 2022-11-08
  Administered 2022-11-08: 12.5 mg via INTRAVENOUS

## 2022-11-08 MED ORDER — MIDAZOLAM HCL 2 MG/2ML IJ SOLN
INTRAMUSCULAR | Status: AC
  Filled 2022-11-08: qty 2

## 2022-11-08 MED ORDER — OXYCODONE HCL 5 MG PO TABS: 5.0000 mg | ORAL_TABLET | ORAL | Status: DC | PRN

## 2022-11-08 MED ORDER — PROPOFOL 10 MG/ML IV BOLUS
INTRAVENOUS | Status: AC
  Filled 2022-11-08: qty 20

## 2022-11-08 MED ORDER — ENOXAPARIN SODIUM 40 MG/0.4ML IJ SOSY
PREFILLED_SYRINGE | INTRAMUSCULAR | Status: AC
  Filled 2022-11-08: qty 0.4

## 2022-11-08 SURGICAL SUPPLY — 78 items
ADH SKN CLS APL DERMABOND .7 (GAUZE/BANDAGES/DRESSINGS) ×3
APL PRP STRL LF DISP 70% ISPRP (MISCELLANEOUS) ×3
BAG DRN RND TRDRP ANRFLXCHMBR (UROLOGICAL SUPPLIES)
BAG URINE DRAIN 2000ML AR STRL (UROLOGICAL SUPPLIES) IMPLANT
CATH FOLEY 3WAY 5CC 16FR (CATHETERS) ×3 IMPLANT
CHLORAPREP W/TINT 26 (MISCELLANEOUS) ×3 IMPLANT
COVER BACK TABLE 60X90IN (DRAPES) ×3 IMPLANT
COVER TIP SHEARS 8 DVNC (MISCELLANEOUS) ×3 IMPLANT
DEFOGGER SCOPE WARMER CLEARIFY (MISCELLANEOUS) ×3 IMPLANT
DERMABOND ADVANCED .7 DNX12 (GAUZE/BANDAGES/DRESSINGS) ×3 IMPLANT
DRAPE ARM DVNC X/XI (DISPOSABLE) ×12 IMPLANT
DRAPE COLUMN DVNC XI (DISPOSABLE) ×3 IMPLANT
DRAPE SHEET LG 3/4 BI-LAMINATE (DRAPES) ×3 IMPLANT
DRAPE SURG IRRIG POUCH 19X23 (DRAPES) ×3 IMPLANT
DRAPE UTILITY XL STRL (DRAPES) ×3 IMPLANT
DRIVER NDL LRG 8 DVNC XI (INSTRUMENTS) ×3 IMPLANT
DRIVER NDL MEGA SUTCUT DVNCXI (INSTRUMENTS) ×3 IMPLANT
DRIVER NDLE LRG 8 DVNC XI (INSTRUMENTS) ×3
DRIVER NDLE MEGA SUTCUT DVNCXI (INSTRUMENTS) ×3
ELECT REM PT RETURN 9FT ADLT (ELECTROSURGICAL) ×3
ELECTRODE REM PT RTRN 9FT ADLT (ELECTROSURGICAL) ×3 IMPLANT
FORCEPS BPLR 8 MD DVNC XI (FORCEP) ×3 IMPLANT
GAUZE 4X4 16PLY ~~LOC~~+RFID DBL (SPONGE) IMPLANT
GLOVE BIOGEL PI IND STRL 6.5 (GLOVE) ×12 IMPLANT
GLOVE BIOGEL PI IND STRL 7.0 (GLOVE) ×6 IMPLANT
GLOVE ECLIPSE 6.0 STRL STRAW (GLOVE) ×9 IMPLANT
GOWN STRL REUS W/TWL LRG LVL3 (GOWN DISPOSABLE) ×3 IMPLANT
GRASPER TIP-UP FEN DVNC XI (INSTRUMENTS) ×3 IMPLANT
HOLDER FOLEY CATH W/STRAP (MISCELLANEOUS) ×3 IMPLANT
IRRIG SUCT STRYKERFLOW 2 WTIP (MISCELLANEOUS) ×3
IRRIGATION SUCT STRKRFLW 2 WTP (MISCELLANEOUS) ×3 IMPLANT
IV NS 1000ML (IV SOLUTION) ×3
IV NS 1000ML BAXH (IV SOLUTION) IMPLANT
KIT PINK PAD W/HEAD ARE REST (MISCELLANEOUS) ×3
KIT PINK PAD W/HEAD ARM REST (MISCELLANEOUS) ×3 IMPLANT
KIT TURNOVER CYSTO (KITS) ×3 IMPLANT
LEGGING LITHOTOMY PAIR STRL (DRAPES) ×3 IMPLANT
MANIFOLD NEPTUNE II (INSTRUMENTS) ×3 IMPLANT
MANIPULATOR ADVINCU DEL 2.5 PL (MISCELLANEOUS) IMPLANT
MANIPULATOR ADVINCU DEL 3.0 PL (MISCELLANEOUS) IMPLANT
MANIPULATOR ADVINCU DEL 3.5 PL (MISCELLANEOUS) IMPLANT
MANIPULATOR ADVINCU DEL 4.0 PL (MISCELLANEOUS) IMPLANT
MESH VERTESSA LITE -Y 2X4X3 (Mesh General) IMPLANT
NDL INSUFFLATION 14GA 120MM (NEEDLE) ×3 IMPLANT
NEEDLE INSUFFLATION 14GA 120MM (NEEDLE) ×3
OBTURATOR OPTICAL STND 8 DVNC (TROCAR) ×3
OBTURATOR OPTICALSTD 8 DVNC (TROCAR) ×3 IMPLANT
PACK CYSTO (CUSTOM PROCEDURE TRAY) ×3 IMPLANT
PACK ROBOT WH (CUSTOM PROCEDURE TRAY) ×3 IMPLANT
PACK ROBOTIC GOWN (GOWN DISPOSABLE) ×3 IMPLANT
PAD OB MATERNITY 4.3X12.25 (PERSONAL CARE ITEMS) ×3 IMPLANT
PAD PREP 24X48 CUFFED NSTRL (MISCELLANEOUS) ×3 IMPLANT
POUCH LAPAROSCOPIC INSTRUMENT (MISCELLANEOUS) IMPLANT
PROTECTOR NERVE ULNAR (MISCELLANEOUS) ×3 IMPLANT
SCISSORS MNPLR CVD DVNC XI (INSTRUMENTS) ×3 IMPLANT
SCRUB CHG 4% DYNA-HEX 4OZ (MISCELLANEOUS) ×3 IMPLANT
SEAL UNIV 5-12 XI (MISCELLANEOUS) ×15 IMPLANT
SEALER VESSEL EXT DVNC XI (MISCELLANEOUS) IMPLANT
SET IRRIG Y TYPE TUR BLADDER L (SET/KITS/TRAYS/PACK) ×3 IMPLANT
SET TUBE SMOKE EVAC HIGH FLOW (TUBING) ×3 IMPLANT
SLEEVE SCD COMPRESS KNEE MED (STOCKING) ×3 IMPLANT
SPIKE FLUID TRANSFER (MISCELLANEOUS) ×6 IMPLANT
SUT GORETEX NAB #0 THX26 36IN (SUTURE) ×3 IMPLANT
SUT MNCRL AB 4-0 PS2 18 (SUTURE) ×3 IMPLANT
SUT MON AB 2-0 SH 27 (SUTURE) ×3 IMPLANT
SUT V-LOC BARB 180 2/0GR9 GS23 (SUTURE) ×6
SUT VIC AB 0 CT1 27 (SUTURE) ×6
SUT VIC AB 0 CT1 27XBRD ANTBC (SUTURE) ×6 IMPLANT
SUT VIC AB 2-0 SH 27 (SUTURE) ×3
SUT VIC AB 2-0 SH 27XBRD (SUTURE) IMPLANT
SUT VICRYL 0 UR6 27IN ABS (SUTURE) IMPLANT
SUT VLOC 180 0 9IN GS21 (SUTURE) IMPLANT
SUT VLOC 180 2-0 9IN GS21 (SUTURE) IMPLANT
SUTURE V-LC BRB 180 2/0GR9GS23 (SUTURE) ×6 IMPLANT
SYS RETRIEVAL 5MM INZII UNIV (BASKET) ×3
SYSTEM RETRIEVL 5MM INZII UNIV (BASKET) IMPLANT
TOWEL OR 17X24 6PK STRL BLUE (TOWEL DISPOSABLE) ×3 IMPLANT
WATER STERILE IRR 500ML POUR (IV SOLUTION) IMPLANT

## 2022-11-08 NOTE — Telephone Encounter (Signed)
Olivia Paul underwent Robotic assisted sacrocolpopexy, right salpingo- oophorectomy, cystoscopy  on 11/08/22.   She passed her voiding trial.  was backfilled into the bladder Voided  PVR by bladder scan was 0ml.   She was discharged without a catheter. Please call her for a routine post op check. Thanks!  Marguerita Beards, MD

## 2022-11-08 NOTE — Anesthesia Procedure Notes (Signed)
Procedure Name: Intubation Date/Time: 11/08/2022 7:48 AM  Performed by: Dairl Ponder, CRNAPre-anesthesia Checklist: Patient identified, Emergency Drugs available, Suction available and Patient being monitored Patient Re-evaluated:Patient Re-evaluated prior to induction Oxygen Delivery Method: Circle System Utilized Preoxygenation: Pre-oxygenation with 100% oxygen Induction Type: IV induction Ventilation: Mask ventilation without difficulty Laryngoscope Size: Mac and 3 Grade View: Grade I Tube type: Oral Tube size: 7.0 mm Number of attempts: 1 Airway Equipment and Method: Stylet and Oral airway Placement Confirmation: ETT inserted through vocal cords under direct vision, positive ETCO2 and breath sounds checked- equal and bilateral Secured at: 21 cm Tube secured with: Tape Dental Injury: Teeth and Oropharynx as per pre-operative assessment

## 2022-11-08 NOTE — Transfer of Care (Signed)
Immediate Anesthesia Transfer of Care Note  Patient: LIYLA MCATEER  Procedure(s) Performed: XI ROBOTIC ASSISTED LAPAROSCOPIC SACROCOLPOPEXY (Pelvis) CYSTOSCOPY (Bladder) XI ROBOTIC ASSISTED OOPHORECTOMY (Right: Pelvis)  Patient Location: PACU  Anesthesia Type:General  Level of Consciousness: sedated  Airway & Oxygen Therapy: Patient Spontanous Breathing and Patient connected to nasal cannula oxygen  Post-op Assessment: Report given to RN and Post -op Vital signs reviewed and stable  Post vital signs: Reviewed and stable  Last Vitals:  Vitals Value Taken Time  BP 139/86 11/08/22 1123  Temp    Pulse 83 11/08/22 1129  Resp 25 11/08/22 1129  SpO2 93 % 11/08/22 1129  Vitals shown include unfiled device data.  Last Pain:  Vitals:   11/08/22 0642  TempSrc: Oral  PainSc: 6       Patients Stated Pain Goal: 4 (11/08/22 4098)  Complications: No notable events documented.

## 2022-11-08 NOTE — Discharge Instructions (Signed)

## 2022-11-08 NOTE — Anesthesia Postprocedure Evaluation (Signed)
Anesthesia Post Note  Patient: Olivia Paul  Procedure(s) Performed: XI ROBOTIC ASSISTED LAPAROSCOPIC SACROCOLPOPEXY (Pelvis) CYSTOSCOPY (Bladder) XI ROBOTIC ASSISTED OOPHORECTOMY (Right: Pelvis)     Patient location during evaluation: PACU Anesthesia Type: General Level of consciousness: awake and alert Pain management: pain level controlled Vital Signs Assessment: post-procedure vital signs reviewed and stable Respiratory status: spontaneous breathing, nonlabored ventilation, respiratory function stable and patient connected to nasal cannula oxygen Cardiovascular status: blood pressure returned to baseline and stable Postop Assessment: no apparent nausea or vomiting Anesthetic complications: no  No notable events documented.  Last Vitals:  Vitals:   11/08/22 1436 11/08/22 1500  BP:  129/78  Pulse: 70 72  Resp: (!) 22 (!) 27  Temp:    SpO2: 95% 95%    Last Pain:  Vitals:   11/08/22 1357  TempSrc:   PainSc: 0-No pain                 Daronte Shostak L Henrik Orihuela

## 2022-11-08 NOTE — OR Nursing (Signed)
Patient still sleepy answers questions appropriatly. Patient asked how surger went, uses incentive spirometer upon requst but still goes back to sleep after she answers questions.  Sats 97-97on 2liters nasal canula. Patients son states she slept one hour last night. Margarita Mail rn

## 2022-11-08 NOTE — Op Note (Addendum)
Operative Note  Preoperative Diagnosis: anterior vaginal prolapse, posterior vaginal prolapse, and vaginal vault prolapse after hysterectomy  Postoperative Diagnosis: anterior vaginal prolapse, posterior vaginal prolapse, and vaginal vault prolapse after hysterectomy and right ovarian cyst  Procedures performed:  Robotic assisted sacrocolpopexy, right salpingo- oophorectomy, cystoscopy  Implants:  Implant Name Type Inv. Item Serial No. Manufacturer Lot No. LRB No. Used Action  MESH Grayland Ormond [id number] [phone] Mesh [employer] MESH Arlys John  [facility] MEDICAL [phone]  1 Implanted    Attending Surgeon: Lanetta Inch, MD  Assistant Surgeon: Jay Schlichter, MD  Anesthesia: General endotracheal  Findings: 1. On vaginal exam, stage II prolapse noted  2. On laparoscopy, mild adhesions of bowel to left pelvic sidewall. Approximately 3cm right ovarian cyst.    3. On cystoscopy, normal bladder and urethra without injury, lesion or foreign body. Brisk bilateral ureteral efflux noted.   Specimens:  ID Type Source Tests Collected by Time Destination  1 : right ovary and fallopian tube Tissue PATH Ovary biopsy SURGICAL PATHOLOGY Marguerita Beards, MD 11/08/2022 [phone]     Estimated blood loss: 25 mL  IV fluids: see flowsheet  Urine output: 30 mL  Complications: none  Procedure in Detail:  After informed consent was obtained, the patient was taken to the operating room, where general anesthesia was induced and found to be adequate. She was placed in dorsolithotomy position in yellowfin stirrups. Her hips were noted not to be hyperflexed or hyperextended. Her arms were padded with gel pads and tucked to her sides. Her hands were surrounded by foam. A padded strap was placed across her chest with foam between the pad and her skin. She was noted to be appropriately positioned with all pressure points well padded and off tension. A tilt test showed no slippage. She was prepped and  draped in the usual sterile fashion.  A sterile Foley catheter was inserted.   0.25% plain Marcaine was injected at the umbilicus and an incision was made with a scalpel. A Veress needle was inserted into the incision, CO2 insufflation was started, a low opening pressure was noted, and pneumoperitoneum was obtained. The Veress needle was removed and a 8mm robotic trocar was placed. Entry into the peritoneal cavity was confirmed. After determining placement for the other ports, Local anesthetic was injected at each site and two 8 mm incisions were made for robotic ports at 10 cm lateral to and at the level of the umbilical port. Two additional 8 mm incisions were made 10 cm lateral to these and 30 degrees down followed by 8 mm robotic ports - the right side for an assistant port. All trocars were placed sequentially under direct visualization of the camera. The patient was placed in Trendelenburg. The sacrum appeared to be free of any adhesive disease.The robot was docked on the patient's right side. Monopolar endoshears were placed in the right arm, a [place] bipolar grasper was placed in the 2nd arm of the patient's left side, and a Tip up grasper was placed in the 3rd arm on the patient's left side.    Due to the ovarian cyst on the right, decision was made to perform oophorectomy. Left ovary was obscured due to bowel adhesions on the left. The right ovary and tube were grasped. A window was made into the peritoneum under the IP ligament and away from the right ureter. The right IP ligament was cauterized and cut. The fallopian tube and ovary were placed into a bag in the abdomen.  Attention was then turned to the sacral promontory. The peritoneum overlying the sacral promontory was tented up, dissected sharply with monopolar scissors and electrosurgery using layer by layer technique. The overlying areolar and adipose tissue were taken down until the anterior longitudinal sacral ligament was identified.  The peritoneal incision was extended down to the posterior cul-de-sac. This was performed with care to avoid the ureter on the right side and the sigmoid colon and its mesentary on the left side.   Small vessels were cauterized along the way to obtain excellent hemostasis. Two CV2 Gortex sutures were placed into the anterior longitudinal ligament. With a Briesky in the vagina, anterior vaginal dissection was then performed with sharp dissection and electrosurgery to separate the vesicovaginal space to the level of the urethra. The posterior vaginal dissection was then performed with sharp dissection and electrosurgery in order to dissect the rectum away from the posterior vagina.    A "Y" mesh was then inserted into the abdomen after trimming to appropriate size. With the probe in the vagina, the anterior leaf of the Y mesh was affixed to the anterior portion of the vagina using a 2-0 v-loc suture in a spiral pattern to distribute the suture evenly across the surface of the anterior mesh leaf. In a similar fashion, the posterior leaf of the Y mesh was attached to the posterior surface of the vagina with 2-0 v-loc suture.  The distal end of the mesh was then brought to overlie the sacrum. The correct amount of tension was determined in order to elevate the vagina, but not put the mesh under tension. The distal end of the mesh was then affixed to the anterior longitudinal sacral ligament using the  stitches of CV2 Gortex. The excess distal mesh was then cut and removed. The peritoneum was reapproximated over the mesh using 2-0 monocryl. The bladder flap was incorporated to completely retroperitonealize the mesh. All pedicles were carefully inspected and noted to be hemostatic as the CO2 gas was deflated. All instruments were removed from the patient's abdomen.    The Foley catheter was removed.  A 70-degree cystoscope was introduced, and 360-degree inspection revealed no injury, lesion or foreign body in the  bladder. Brisk bilateral ureteral efflux was noted with the assistance of pyridium.  The bladder was drained and the cystoscope was removed.  The Foley catheter was replaced.   The robot was undocked. The bag with the ovary was pulled through the umbilical incision. The trocar was removed and the bag brought to the surface. The fascia was extender. The cyst was then incised and fluid drained. The bag was then brought through the incision. The fascia was tagged with kochers and then closed with a 0-vicryl suture. The camera was inserted to ensure closure and no trapped bowel. The camera was then removed. The CO2 gas was removed and the ports were removed.  The skin incisions were closed with subcutaneous stitches of 4-0 Monocryl and covered with skin glue.    The patient tolerated the procedure well. Sponge, lap, and needle counts were correct x 2. She was awakened from anesthesia and transferred to the recovery room in stable condition.   Marguerita Beards, MD  Dr Bartholomew Crews assistance was required due to the complexity of the case and need for skilled assistant.

## 2022-11-08 NOTE — Interval H&P Note (Signed)
History and Physical Interval Note:  11/08/2022 7:17 AM  Olivia Paul  has presented today for surgery, with the diagnosis of anterior vaginal prolapse; posterior vaginal prolapse; prolapse of vaginal vault after hysterectomy.  The various methods of treatment have been discussed with the patient and family. After consideration of risks, benefits and other options for treatment, the patient has consented to  Procedure(s): XI ROBOTIC ASSISTED LAPAROSCOPIC SACROCOLPOPEXY (N/A) CYSTOSCOPY (N/A) as a surgical intervention.  The patient's history has been reviewed, patient examined, no change in status, stable for surgery.  I have reviewed the patient's chart and labs.  Questions were answered to the patient's satisfaction.     Marguerita Beards

## 2022-11-08 NOTE — OR Nursing (Signed)
11:22 patient extremely juicy and gurgly and not responsive upon arrival from OR.  Oxygen in the 90"s , oral airway in place nasal canula at 4 liters.Attempted to suction with [place]. Suctioned clear liquid secretions.  Approx 5- minutes upon arrival Dr. Armond Hang called to bedside . She suctioned patient with soft [place]. Sats in high 90's. Margarita Mail rn

## 2022-11-09 ENCOUNTER — Encounter (HOSPITAL_BASED_OUTPATIENT_CLINIC_OR_DEPARTMENT_OTHER): Payer: Self-pay | Admitting: Obstetrics and Gynecology

## 2022-11-09 ENCOUNTER — Other Ambulatory Visit: Payer: Self-pay | Admitting: Obstetrics and Gynecology

## 2022-11-09 LAB — SURGICAL PATHOLOGY

## 2022-11-09 NOTE — Telephone Encounter (Signed)
Called and Spoke to patient:   Patient reports at it's highest her pain has been about a 5/10. She reports her pain has been well controlled with Tylenol, Ibuprofen and Tramadol.   Patient reports she has had a significant amount of incontinence and was told a the surgical center not to take her Leslye Peer and she was wearing a pad and depends. I encouraged her that she can re-start her Gemtesa.  She denies bleeding.  Reports she has had a bowel movement without difficulty.    She reports she is having some congestion and has been doing nebulizer treatments.   She was asking if there was a reason her left ovary was left in place and we discussed it can be harder to get to that ovary at times. She is also asking if she should continue the Nitrofurantoin 50mg  nightly.

## 2022-11-10 ENCOUNTER — Ambulatory Visit: Payer: Self-pay | Admitting: Internal Medicine

## 2022-11-21 ENCOUNTER — Telehealth: Payer: Self-pay

## 2022-11-21 NOTE — Telephone Encounter (Signed)
Erroneous entry

## 2022-12-15 ENCOUNTER — Ambulatory Visit (INDEPENDENT_AMBULATORY_CARE_PROVIDER_SITE_OTHER): Payer: Medicare Other | Admitting: Internal Medicine

## 2022-12-15 ENCOUNTER — Encounter: Payer: Self-pay | Admitting: Internal Medicine

## 2022-12-15 VITALS — BP 130/82 | HR 78 | Temp 98.3°F | Ht 59.0 in | Wt 192.6 lb

## 2022-12-15 DIAGNOSIS — Z6838 Body mass index (BMI) 38.0-38.9, adult: Secondary | ICD-10-CM

## 2022-12-15 DIAGNOSIS — R7303 Prediabetes: Secondary | ICD-10-CM

## 2022-12-15 DIAGNOSIS — J4540 Moderate persistent asthma, uncomplicated: Secondary | ICD-10-CM

## 2022-12-15 DIAGNOSIS — E66812 Obesity, class 2: Secondary | ICD-10-CM

## 2022-12-15 DIAGNOSIS — G4733 Obstructive sleep apnea (adult) (pediatric): Secondary | ICD-10-CM | POA: Diagnosis not present

## 2022-12-15 DIAGNOSIS — E7800 Pure hypercholesterolemia, unspecified: Secondary | ICD-10-CM

## 2022-12-15 DIAGNOSIS — Z8249 Family history of ischemic heart disease and other diseases of the circulatory system: Secondary | ICD-10-CM

## 2022-12-15 DIAGNOSIS — N3281 Overactive bladder: Secondary | ICD-10-CM

## 2022-12-15 NOTE — Patient Instructions (Signed)

## 2022-12-16 LAB — HEMOGLOBIN A1C
Est. average glucose Bld gHb Est-mCnc: 128 mg/dL
Hgb A1c MFr Bld: 6.1 % — ABNORMAL HIGH (ref 4.8–5.6)

## 2022-12-16 LAB — CMP14+EGFR
ALT: 11 [IU]/L (ref 0–32)
AST: 17 [IU]/L (ref 0–40)
Albumin: 4.2 g/dL (ref 3.9–4.9)
Alkaline Phosphatase: 153 [IU]/L — ABNORMAL HIGH (ref 44–121)
BUN/Creatinine Ratio: 19 (ref 12–28)
BUN: 14 mg/dL (ref 8–27)
Bilirubin Total: 0.4 mg/dL (ref 0.0–1.2)
CO2: 24 mmol/L (ref 20–29)
Calcium: 9.7 mg/dL (ref 8.7–10.3)
Chloride: 106 mmol/L (ref 96–106)
Creatinine, Ser: 0.73 mg/dL (ref 0.57–1.00)
Globulin, Total: 2.9 g/dL (ref 1.5–4.5)
Glucose: 83 mg/dL (ref 70–99)
Potassium: 4.3 mmol/L (ref 3.5–5.2)
Sodium: 144 mmol/L (ref 134–144)
Total Protein: 7.1 g/dL (ref 6.0–8.5)
eGFR: 91 mL/min/{1.73_m2} (ref >59)

## 2022-12-16 LAB — LIPOPROTEIN A (LPA): Lipoprotein (a): 261 nmol/L — ABNORMAL HIGH (ref <75.0)

## 2022-12-17 ENCOUNTER — Other Ambulatory Visit: Payer: Self-pay | Admitting: Internal Medicine

## 2022-12-17 DIAGNOSIS — E7841 Elevated Lipoprotein(a): Secondary | ICD-10-CM

## 2022-12-19 NOTE — Assessment & Plan Note (Signed)
Chronic, most recent operative notes reviewed in detail.

## 2022-12-19 NOTE — Assessment & Plan Note (Signed)
Chronic, she was congratulated on her 7lb weight loss since September 2024. Encouraged to aim to lose ten percent of her body weight to decrease cardiac risk.

## 2022-12-19 NOTE — Assessment & Plan Note (Signed)
Previous labs reviewed, her a1c has been elevated in the past. I will recheck an a1c today. She is encouraged to limit her intake of sugary foods/drinks

## 2022-12-19 NOTE — Assessment & Plan Note (Signed)
Chronic, reminded to wear CPAP at least four hours per night. She has acknowledged improvement in her daytime fatigue with use of OSA.

## 2022-12-19 NOTE — Assessment & Plan Note (Signed)
Chronic, she is currently on atorvastatin 20mg  daily. Encouraged to follow a heart healthy lifestyle.

## 2022-12-19 NOTE — Assessment & Plan Note (Signed)
Chronic, stable. She will continue with use of Breo 200-26mcg, inhaling one puff daily. Encouraged to avoid known triggers.

## 2022-12-20 ENCOUNTER — Ambulatory Visit (INDEPENDENT_AMBULATORY_CARE_PROVIDER_SITE_OTHER): Payer: Medicare Other | Admitting: Obstetrics and Gynecology

## 2022-12-20 ENCOUNTER — Encounter: Payer: Self-pay | Admitting: Obstetrics and Gynecology

## 2022-12-20 VITALS — BP 119/85 | HR 87

## 2022-12-20 DIAGNOSIS — R159 Full incontinence of feces: Secondary | ICD-10-CM

## 2022-12-20 DIAGNOSIS — Z9889 Other specified postprocedural states: Secondary | ICD-10-CM

## 2022-12-20 DIAGNOSIS — Z48816 Encounter for surgical aftercare following surgery on the genitourinary system: Secondary | ICD-10-CM | POA: Diagnosis not present

## 2022-12-20 DIAGNOSIS — M62838 Other muscle spasm: Secondary | ICD-10-CM

## 2022-12-20 DIAGNOSIS — N3281 Overactive bladder: Secondary | ICD-10-CM | POA: Diagnosis not present

## 2022-12-20 NOTE — Patient Instructions (Addendum)
Today we talked about ways to manage bladder urgency such as altering your diet to avoid irritative beverages and foods (bladder diet) as well as attempting to decrease stress and other exacerbating factors.    The Most Bothersome Foods* The Least Bothersome Foods*  Coffee - Regular & Decaf Tea - caffeinated Carbonated beverages - cola, non-colas, diet & caffeine-free Alcohols - Beer, Red Wine, White Wine, [address] - Grapefruit, [place], Orange, [employer] - Cranberry, Grapefruit, Orange, Pineapple Vegetables - Tomato & Tomato Products Flavor Enhancers - Hot peppers, Spicy foods, Chili, Horseradish, Vinegar, Monosodium glutamate (MSG) Artificial Sweeteners - NutraSweet, Sweet 'N Low, Equal (sweetener), Saccharin Ethnic foods - [place], [place], [place] food [employer] - low-fat & whole Fruits - Bananas, Blueberries, Honeydew melon, Pears, Raisins, Watermelon Vegetables - Broccoli, [address] Sprouts, [place], Carrots, Cauliflower, [place], Cucumber, Mushrooms, Peas, Radishes, Squash, Zucchini, White potatoes, Sweet potatoes & yams Poultry - Chicken, Eggs, [place], [employer] - Beef, Diplomatic Services operational officer, Lamb Seafood - Shrimp, [place] fish, Salmon Grains - Oat, Rice Snacks - Pretzels, Popcorn  *Lenward Chancellor et al. Diet and its role in interstitial cystitis/bladder pain syndrome (IC/BPS) and comorbid conditions. BJU [employer]. BJU Int. 2012 Jan 11.   Start metamucil once a day for bowel leakage.

## 2022-12-20 NOTE — Progress Notes (Signed)
[facility]  Date of Visit: 12/20/2022  History of Present Illness: Olivia Paul is a 67 y.o. female scheduled today for a post-operative visit.   Surgery: s/p Robotic assisted sacrocolpopexy, right salpingo- oophorectomy, cystoscopy on 11/08/22  She passed her postoperative void trial.   Postoperative course has been uncomplicated.   Today she reports still having some pain in lower abdomen and sides. Does not have back pain. Pain is not every day. Denies burning with urination. Still also taking nitrofurantoin prophylaxis. Recently has been drinking maybe 1 cup coffee (usually decaf), sometimes lemonade, gatorade or soda or seltzer water.   UTI in the last 6 weeks? No  Pain?  See above She has returned to her normal activity (except for postop restrictions) Vaginal bulge? No  Stress incontinence: No  Urgency/frequency: Yes  Urge incontinence: Yes - has been worsening, lost control the other day. She is still taking the [place].  Voiding dysfunction: No  Bowel issues:  has been having some bowel leakage recently.   Having loose stools.   Subjective Success: Do you usually have a bulge or something falling out that you can see or feel in the vaginal area? No  Retreatment Success: Any retreatment with surgery or pessary for any compartment? No   Pathology:  FALLOPIAN TUBE AND OVARY, RIGHT, OOPHORECTOMY:  -Ovary: Benign serous cystadenoma  - Fallopian tube: Benign, no significant pathologic changes   Medications: She has a current medication list which includes the following prescription(s): acetaminophen, albuterol, albuterol, vitamin c, atorvastatin, benzonatate, breo ellipta, cetirizine, vitamin d, clobetasol cream, docusate sodium, duloxetine, epinephrine, famotidine, fexofenadine, gabapentin, hydroxyzine, ipratropium, omega-3, magnesium oxide, meclizine, menthol-methyl salicylate, montelukast, nitrofurantoin, ondansetron, pantoprazole, tizanidine, tramadol, triamcinolone  cream, turmeric, vitamin e, vitamin k, and zonisamide.   Allergies: Patient is allergic to meloxicam, nsaids, sulfa antibiotics, cat hair extract, pollen extract, and erythromycin.   Physical Exam: BP 119/85   Pulse 87   Abdomen: soft, non-tender, without masses or organomegaly Laparoscopic Incisions: healing well.  Pelvic Examination: Vagina: Normal mucosa.  No tenderness along the anterior or posterior vagina. No apical tenderness. No pelvic masses. No visible or palpable mesh. + tenderness in obturator internus bilaterally.   POP-Q: POP-Q  -3                                            Aa   -3                                           Ba  -7.5                                              C   1.5                                            Gh  3.5  Pb  7.5                                            tvl   -2.5                                            Ap  -2.5                                            Bp                                                 D     ---------------------------------------------------------  Assessment and Plan:  1. Post-operative state   2. OAB (overactive bladder)   3. Levator spasm   4. Incontinence of feces, unspecified fecal incontinence type    Post op  - Pathology results were reviewed with the patient today and she verbalized understanding that the results were benign.  - Can resume regular activity including exercise and intercourse,  if desired.  - Discussed avoidance of heavy lifting and straining long term to reduce the risk of recurrence.   2. Incontinence - keep a food/ drink diary to identify triggers  - Metamucil daily for stool bulking.  - Continue with Gemtesa daily - Discussed option of pelvic PT as this can also help her pelvic floor muscle spasm. Referral placed.  - Reviewed option of sacral nerve stimulation and [employer] brochure provided. She wants to consider this if she is  not seeing improvement with more conservative measures.   Return 6 weeks  Marguerita Beards, MD

## 2022-12-30 ENCOUNTER — Ambulatory Visit: Payer: Medicare Other | Admitting: Allergy

## 2023-01-16 ENCOUNTER — Other Ambulatory Visit: Payer: Self-pay | Admitting: Internal Medicine

## 2023-01-16 DIAGNOSIS — Z0000 Encounter for general adult medical examination without abnormal findings: Secondary | ICD-10-CM

## 2023-01-18 ENCOUNTER — Encounter: Payer: Self-pay | Admitting: Obstetrics and Gynecology

## 2023-01-18 ENCOUNTER — Other Ambulatory Visit: Payer: Self-pay

## 2023-01-18 MED ORDER — GEMTESA 75 MG PO TABS: 75.0000 mg | ORAL_TABLET | Freq: Every day | ORAL | 0 refills | Status: DC

## 2023-01-18 MED ORDER — GEMTESA 75 MG PO TABS: 75.0000 mg | ORAL_TABLET | Freq: Every day | ORAL | 2 refills | Status: DC

## 2023-01-18 NOTE — Progress Notes (Signed)
30 day rx sent to the pharmacy. Dr. Florian Buff will order refills during the patients appointment on 01/31/23

## 2023-01-31 ENCOUNTER — Encounter: Payer: Self-pay | Admitting: Obstetrics and Gynecology

## 2023-01-31 ENCOUNTER — Ambulatory Visit (INDEPENDENT_AMBULATORY_CARE_PROVIDER_SITE_OTHER): Payer: Medicare Other | Admitting: Obstetrics and Gynecology

## 2023-01-31 VITALS — BP 96/65 | HR 74

## 2023-01-31 DIAGNOSIS — R159 Full incontinence of feces: Secondary | ICD-10-CM | POA: Diagnosis not present

## 2023-01-31 DIAGNOSIS — N3281 Overactive bladder: Secondary | ICD-10-CM | POA: Diagnosis not present

## 2023-01-31 MED ORDER — GEMTESA 75 MG PO TABS: 75.0000 mg | ORAL_TABLET | Freq: Every day | ORAL | 11 refills | Status: DC

## 2023-01-31 NOTE — Progress Notes (Signed)
 Urogynecology  Date of Visit: 01/31/2023  History of Present Illness: [name] is a 67 y.o. female scheduled today for a post-operative visit.   Surgery: s/p Robotic assisted sacrocolpopexy, right salpingo- oophorectomy, cystoscopy on 11/08/22  She passed her postoperative void trial.   Urinary leakage is improved. She can make it to the bathroom better than before. Still wears a pad in case. Wants to continue with [facility] for now but may be interested in [facility] if she does not see improvement in her symptoms with pelvic PT. Does not want to be on medication long term.   She has been taking metamucil and no longer having bowel leakage.    Medications: She has a current medication list which includes the following prescription(s): acetaminophen, albuterol, albuterol, vitamin c, atorvastatin, benzonatate, breo ellipta, cetirizine, vitamin d, clobetasol cream, docusate sodium, duloxetine, epinephrine, famotidine, fexofenadine, gabapentin, hydroxyzine, ipratropium, omega-3, magnesium oxide, meclizine, menthol-methyl salicylate, montelukast, nitrofurantoin, ondansetron, pantoprazole, tizanidine, tramadol, triamcinolone cream, turmeric, gemtesa, vitamin e, vitamin k, and zonisamide.   Allergies: Patient is allergic to meloxicam, nsaids, sulfa antibiotics, cat hair extract, pollen extract, and erythromycin.   Physical Exam: BP 96/65   Pulse 74    Pelvic exam deferred  ---------------------------------------------------------  Assessment and Plan:  1. OAB (overactive bladder)   2. Incontinence of feces, unspecified fecal incontinence type     - Continue Metamucil daily for stool bulking and prevention of constipation.  - Continue with Gemtesa daily, refills provided. Will revisit SN[facility] at next visit.  - Has pelvic PT scheduled in Feb.   Return end of March or sooner if needed  Marguerita Beards, MD  Time spent: I spent 20 minutes dedicated to the care of this patient on the  date of this encounter to include pre-visit review of records, face-to-face time with the patient  and post visit documentation.

## 2023-03-03 ENCOUNTER — Ambulatory Visit
Admission: RE | Admit: 2023-03-03 | Discharge: 2023-03-03 | Disposition: A | Discharge status: Home / Self Care | Payer: Medicare Other | Source: Ambulatory Visit | Attending: Internal Medicine | Admitting: Internal Medicine

## 2023-03-03 DIAGNOSIS — Z0000 Encounter for general adult medical examination without abnormal findings: Secondary | ICD-10-CM

## 2023-03-15 ENCOUNTER — Ambulatory Visit (HOSPITAL_BASED_OUTPATIENT_CLINIC_OR_DEPARTMENT_OTHER): Payer: Medicare Other | Admitting: Cardiology

## 2023-03-15 ENCOUNTER — Encounter (HOSPITAL_BASED_OUTPATIENT_CLINIC_OR_DEPARTMENT_OTHER): Payer: Self-pay | Admitting: Cardiology

## 2023-03-15 VITALS — BP 124/74 | HR 87 | Ht 59.0 in | Wt 205.0 lb

## 2023-03-15 DIAGNOSIS — E7800 Pure hypercholesterolemia, unspecified: Secondary | ICD-10-CM | POA: Diagnosis not present

## 2023-03-15 DIAGNOSIS — R7303 Prediabetes: Secondary | ICD-10-CM

## 2023-03-15 DIAGNOSIS — E7841 Elevated Lipoprotein(a): Secondary | ICD-10-CM | POA: Diagnosis not present

## 2023-03-15 DIAGNOSIS — Z8249 Family history of ischemic heart disease and other diseases of the circulatory system: Secondary | ICD-10-CM

## 2023-03-15 DIAGNOSIS — Z7189 Other specified counseling: Secondary | ICD-10-CM | POA: Diagnosis not present

## 2023-03-15 NOTE — Patient Instructions (Signed)
Medication Instructions:  Your physician recommends that you continue on your current medications as directed. Please refer to the Current Medication list given to you today.  *If you need a refill on your cardiac medications before your next appointment, please call your pharmacy*   Follow-Up: At [facility], you and your health needs are our priority.  As part of our continuing mission to provide you with exceptional heart care, we have created designated Provider Care Teams.  These Care Teams include your primary Cardiologist (physician) and Advanced Practice Providers (APPs -  Physician Assistants and Nurse Practitioners) who all work together to provide you with the care you need, when you need it.  We recommend signing up for the patient portal called "MyChart".  Sign up information is provided on this After Visit Summary.  MyChart is used to connect with patients for Virtual Visits (Telemedicine).  Patients are able to view lab/test results, encounter notes, upcoming appointments, etc.  Non-urgent messages can be sent to your provider as well.   To learn more about what you can do with MyChart, go to [email or web address].    Your next appointment:   12 month(s)  The format for your next appointment:   In Person  Provider:   Jodelle Red, MD

## 2023-03-15 NOTE — Progress Notes (Unsigned)
Cardiology Office Note:  .   Date:  03/15/2023  ID:  Olivia Paul, DOB 1955/09/26, MRN [id number] PCP: Dorothyann Peng, MD   HeartCare Providers Cardiologist:  Jodelle Red, MD {  History of Present Illness: Olivia Paul is a 68 y.o. female with elevated Lpa, family history of heart disease, hypercholesterolemia, obesity, asthma, OSA on CPAP. I initially met her in 2021, and she returns today for consultation for elevated lp(a).  Today: Had lp(a) drawn in October, elevated value of 261. She had this drawn after her maternal uncle passed away (he was 53), noted to have very clogged arteries. Coroner recommended her family be screened.  Lipids from 05/10/22 show Tchol 123, TG 52, HDL 46, LDL 65.  FH: father's side has some heart disease. Cousin's daughter had heart issues. On mother's side, mother had heart failure and kidney disease. Maternal uncle with heart failure and cancer. Maternal grandmother had enlarged heart.  We had extensive discussion regarding lp(a).  Had prior chest pain from GERD/asthma, but none recently.  Review[facility] lifestyle[facility] Did prediabetes class to focus on healthy eating.  ROS: Denies chest pain, shortness of breath at rest or with normal exertion (Except with asthma exacerbation).  No PND, orthopnea, LE edema or unexpected weight gain. No syncope or palpitations. ROS otherwise negative except as noted.   Studies Reviewed: Marland Kitchen    EKG:  EKG Interpretation Date/Time:  Wednesday March 15 2023 14:42:08 EST Ventricular Rate:  83 PR Interval:  144 QRS Duration:  70 QT Interval:  374 QTC Calculation: 439 R Axis:   10  Text Interpretation: Normal sinus rhythm Normal ECG Confirmed by Jodelle Red [phone] on 03/15/2023 3:38:51 PM    Physical Exam:   VS:  BP 124/74   Pulse 87   Ht 4\' 11"  (1.499 m)   Wt 205 lb (93 kg)   SpO2 98%   BMI 41.40 kg/m    Wt Readings from Last 3 Encounters:  03/15/23  205 lb (93 kg)  12/15/22 192 lb 9.6 oz (87.4 kg)  11/08/22 199 lb 6.4 oz (90.4 kg)    GEN: Well nourished, well developed in no acute distress HEENT: Normal, moist mucous membranes NECK: No JVD CARDIAC: regular rhythm, normal S1 and S2, no rubs or gallops. No murmur. VASCULAR: Radial and DP pulses 2+ bilaterally. No carotid bruits RESPIRATORY:  Clear to auscultation without rales, wheezing or rhonchi  ABDOMEN: Soft, non-tender, non-distended MUSCULOSKELETAL:  Ambulates independently SKIN: Warm and dry, no edema NEUROLOGIC:  Alert and oriented x 3. No focal neuro deficits noted. PSYCHIATRIC:  Normal affect    ASSESSMENT AND PLAN: .    Hypercholesterolemia -well controlled on atorvastatin 20 mg dose.  Elevated lp(a) Family history of heart disease -discussed at length today. No specific treatment for this right now, especially for primary prevention, but discussed optimizing risk factors and waiting for clinical trial results for targeted agents  Prediabetes Obesity, BMI 41 -last A1c 6.2 -has done program for prediabetes focused on diet, lifestyle, activity  OSA on CPAP: uses most of time  CV risk counseling and prevention -recommend heart healthy/Mediterranean diet, with whole grains, fruits, vegetable, fish, lean meats, nuts, and olive oil. Limit salt. -recommend moderate walking, 3-5 times/week for 30-50 minutes each session. Aim for at least 150 minutes.week. Goal should be pace of 3 miles/hours, or walking 1.5 miles in 30 minutes -recommend avoidance of tobacco products. Avoid excess alcohol.  Dispo: 1 year or sooner  as needed  Signed, Jodelle Red, MD   Jodelle Red, MD, PhD, [facility]  [facility]  [place]  [facility] at [facility] [address], [address] [place], [place] [address] [phone]

## 2023-03-23 ENCOUNTER — Encounter: Payer: Self-pay | Admitting: Allergy

## 2023-03-23 ENCOUNTER — Ambulatory Visit (INDEPENDENT_AMBULATORY_CARE_PROVIDER_SITE_OTHER): Payer: Medicare Other | Admitting: Allergy

## 2023-03-23 ENCOUNTER — Other Ambulatory Visit: Payer: Self-pay

## 2023-03-23 VITALS — BP 118/62 | HR 74 | Temp 98.3°F | Resp 18 | Wt 203.4 lb

## 2023-03-23 DIAGNOSIS — J3089 Other allergic rhinitis: Secondary | ICD-10-CM | POA: Diagnosis not present

## 2023-03-23 DIAGNOSIS — L508 Other urticaria: Secondary | ICD-10-CM | POA: Diagnosis not present

## 2023-03-23 DIAGNOSIS — L309 Dermatitis, unspecified: Secondary | ICD-10-CM

## 2023-03-23 DIAGNOSIS — J4540 Moderate persistent asthma, uncomplicated: Secondary | ICD-10-CM

## 2023-03-23 DIAGNOSIS — H1013 Acute atopic conjunctivitis, bilateral: Secondary | ICD-10-CM

## 2023-03-23 MED ORDER — ALBUTEROL SULFATE HFA 108 (90 BASE) MCG/ACT IN AERS: 2.0000 | INHALATION_SPRAY | RESPIRATORY_TRACT | 1 refills | Status: AC | PRN

## 2023-03-23 MED ORDER — MONTELUKAST SODIUM 10 MG PO TABS: 10.0000 mg | ORAL_TABLET | Freq: Every day | ORAL | 5 refills | Status: DC

## 2023-03-23 MED ORDER — BREO ELLIPTA 200-25 MCG/ACT IN AEPB: 1.0000 | INHALATION_SPRAY | Freq: Every day | RESPIRATORY_TRACT | 5 refills | Status: DC

## 2023-03-23 MED ORDER — FAMOTIDINE 20 MG PO TABS: 20.0000 mg | ORAL_TABLET | Freq: Two times a day (BID) | ORAL | 5 refills | Status: DC

## 2023-03-23 MED ORDER — ALBUTEROL SULFATE (2.5 MG/3ML) 0.083% IN NEBU: INHALATION_SOLUTION | RESPIRATORY_TRACT | 0 refills | Status: AC

## 2023-03-23 NOTE — Progress Notes (Signed)
 Follow-up Note  RE: Olivia Paul MRN: [id number] DOB: December 15, 1955 Date of Office Visit: 03/23/2023   History of present illness: Olivia Paul is a 68 y.o. female presenting today for follow-up of urticaria, asthma, allergic rhinitis.  She was last seen in the office on 09/22/2022 by myself. Discussed the use of AI scribe software for clinical note transcription with the patient, who gave verbal consent to proceed.  During a trip to [place]  for Christmas, she experienced respiratory symptoms, likely exacerbated by a change in weather and exposure to snow. She traveled by car to [place]  and returned by train. Upon returning home, she developed coughing, severe headaches, wheezing, nasal congestion, and phlegm production. She did not undergo testing for flu, RSV, or COVID-19, attributing her symptoms to asthma and allergies.  She managed her symptoms with her usual asthma regimen, including using a nebulizer three times a day, nasal spray once or twice daily, Singulair , and Allegra . Despite these measures, she experienced significant shortness of breath, especially when walking up stairs, and continued to have wheezing. She also used her inhaler and Breo during this period, although she lost her Breo inhaler at one point and had to use an alternative inhaler she found at home. She did not require antibiotics and did not seek urgent care or additional medical testing during her illness.  She has since started to feel better, although she still experiences some shortness of breath and wheezing. She attributes some of her ongoing symptoms to not being on her regular Breo regimen. In addition to her asthma medications, she used an emergency vitamin supplement EmergenC mixed with orange juice to boost her immune system.  No recent rash or hive issues, and she has not been taking Pepcid  as she has not had a year for the hives. She continues to use her regular antihistamine and Singulair .       Review of systems: 10pt ROS negative unless noted above in HPI   All other systems negative unless noted above in HPI  Past medical/social/surgical/family history have been reviewed and are unchanged unless specifically indicated below.  No changes  Medication List: Current Outpatient Medications  Medication Sig Dispense Refill   acetaminophen  (TYLENOL ) 500 MG tablet Take 1 tablet (500 mg total) by mouth every 6 (six) hours as needed (pain). 30 tablet 0   Ascorbic Acid (VITAMIN C) 100 MG tablet Take 100 mg by mouth daily.     atorvastatin  (LIPITOR) 20 MG tablet TAKE 1 TABLET BY MOUTH EVERY DAY (Patient taking differently: Take 20 mg by mouth every evening.) 90 tablet 1   cetirizine  (ZYRTEC ) 10 MG tablet Take 1 tablet (10 mg total) by mouth daily. (Patient taking differently: Take 10 mg by mouth as needed.) 30 tablet 5   Cholecalciferol (VITAMIN D ) 50 MCG (2000 UT) CAPS Take 5,000 Units by mouth daily.      clobetasol  cream (TEMOVATE ) 0.05 % Apply topically 2 (TWO) times a day as needed to areas of ras 60 g 1   DULoxetine  (CYMBALTA ) 60 MG capsule Take 1 capsule (60 mg total) by mouth daily. (Patient taking differently: Take 60 mg by mouth every evening.) 90 capsule 2   EPINEPHrine  0.3 mg/0.3 mL IJ SOAJ injection Inject 0.3 mLs (0.3 mg total) into the muscle as needed for anaphylaxis. 1 each 1   fexofenadine  (ALLEGRA ) 180 MG tablet TAKE ONE TABLET ONCE DAILY FOR RUNNY NOSE OR ITCHING (Patient taking differently: daily. TAKE ONE TABLET ONCE DAILY FOR RUNNY NOSE OR ITCHING)  90 tablet 1   gabapentin  (NEURONTIN ) 300 MG capsule as needed.     ipratropium (ATROVENT ) 0.06 % nasal spray 1-2 sprays each nostril up to 3-4 times a day as needed for nasal drainage or congestion 15 mL 5   Menthol-Methyl Salicylate (TIGER BALM LINIMENT EX) Apply 1 application topically at bedtime as needed (pain).     pantoprazole  (PROTONIX ) 40 MG tablet TAKE 1 TABLET BY MOUTH EVERY DAY (Patient taking differently: at  bedtime.) 90 tablet 1   tiZANidine  (ZANAFLEX ) 4 MG tablet Take 4 mg by mouth every 8 (eight) hours as needed.     traMADol  (ULTRAM ) 50 MG tablet Take 50 mg by mouth every 8 (eight) hours as needed for moderate pain.      triamcinolone  cream (KENALOG ) 0.1 % APPLY TO AFFECTED AREA TWICE DAILY AS NEEDED 453.6 g 1   TURMERIC PO Take by mouth as needed.     Vibegron  (GEMTESA ) 75 MG TABS Take 1 tablet (75 mg total) by mouth daily. 30 tablet 11   zonisamide (ZONEGRAN) 50 MG capsule Take 100 mg by mouth at bedtime. Pt said she is taking 100mg      albuterol  (PROVENTIL ) (2.5 MG/3ML) 0.083% nebulizer solution Use 1 vial every 4-6 hours as needed for cough, wheeze, shortness of breath or chest tightness 150 mL 0   albuterol  (VENTOLIN  HFA) 108 (90 Base) MCG/ACT inhaler Inhale 2 puffs into the lungs every 4 (four) hours as needed for wheezing or shortness of breath. 18 g 1   benzonatate  (TESSALON  PERLES) 100 MG capsule Take 1 capsule (100 mg total) by mouth 3 (three) times daily as needed for cough. (Patient not taking: Reported on 11/02/2022) 30 capsule 1   BREO ELLIPTA  200-25 MCG/ACT AEPB Inhale 1 puff into the lungs daily. 60 each 5   docusate sodium  (COLACE) 100 MG capsule Take 1 capsule (100 mg total) by mouth 2 (two) times daily. (Patient not taking: Reported on 12/15/2022) 10 capsule 0   famotidine  (PEPCID ) 20 MG tablet Take 1 tablet (20 mg total) by mouth 2 (two) times daily. 30 tablet 5   hydrOXYzine  (ATARAX ) 25 MG tablet TAKE 1 TABLET BY MOUTH AT BEDTIME AS NEEDED FOR ITCHING. (Patient not taking: Reported on 03/23/2023) 30 tablet 0   Krill Oil (OMEGA-3) 500 MG CAPS Take 500 mg by mouth daily. (Patient not taking: Reported on 03/23/2023)     magnesium  oxide (MAG-OX) 400 MG tablet Take 400 mg by mouth daily. (Patient not taking: Reported on 03/23/2023)     meclizine  (ANTIVERT ) 50 MG tablet Take 1/2 tab po qpm prn (Patient not taking: Reported on 03/23/2023) 30 tablet 0   montelukast  (SINGULAIR ) 10 MG tablet Take  1 tablet (10 mg total) by mouth at bedtime. 30 tablet 5   nitrofurantoin  (MACRODANTIN ) 50 MG capsule TAKE 1 CAPSULE BY MOUTH EVERYDAY AT BEDTIME (Patient not taking: Reported on 03/23/2023) 30 capsule 1   ondansetron  (ZOFRAN ) 4 MG tablet Take 1 tablet (4 mg total) by mouth every 8 (eight) hours as needed for nausea or vomiting. (Patient not taking: Reported on 03/23/2023) 10 tablet 0   vitamin E 400 UNIT capsule Take 400 Units by mouth daily. (Patient not taking: Reported on 03/23/2023)     vitamin k 100 MCG tablet Take 100 mcg by mouth daily. (Patient not taking: Reported on 03/23/2023)     No current facility-administered medications for this visit.     Known medication allergies: Allergies  Allergen Reactions   Meloxicam Other (See Comments)  Bruised her legs all over     Nsaids Other (See Comments)    Scarring and bruising of legs   Sulfa Antibiotics Other (See Comments)    Mouth broke out in sores   Cat Dander    Pollen Extract    Erythromycin Nausea And Vomiting     Physical examination: Blood pressure 118/62, pulse 74, temperature 98.3 F (36.8 C), temperature source Temporal, resp. rate 18, weight 203 lb 6.4 oz (92.3 kg), SpO2 97%.  General: Alert, interactive, in no acute distress. HEENT: PERRLA, TMs pearly gray, turbinates non-edematous without discharge, post-pharynx non erythematous. Neck: Supple without lymphadenopathy. Lungs: Clear to auscultation without wheezing, rhonchi or rales. {no increased work of breathing. CV: Normal S1, S2 without murmurs. Abdomen: Nondistended, nontender. Skin: Warm and dry, without lesions or rashes. Extremities:  No clubbing, cyanosis or edema. Neuro:   Grossly intact.  Diagnositics/Labs:  Spirometry: FEV1: 1.59L 92%, FVC: 1.81L 83%, ratio consistent with nonobstructive pattern  Assessment and plan:   Urticaria, chronic - improved/resolved for now - etiology of hives likely with a allergen component.  Hives can be caused by a variety  of different triggers including illness/infection, foods, medications, stings, exercise, pressure, vibrations, extremes of temperature to name a few however majority of the time there is no identifiable trigger.   - if hives return take high-dose antihistamine regimen:  Allegra  in AM, Zyrtec  or Allegra  in PM, Pepcid  20mg  1 tab twice a day in AM and in PM, Singulair  daily.   Can continue use of hydroxyzine  25mg  at bedtime.   - we have discussed previously Xolair monthly injections for hive control if hives are not controlled with high-dose antihistamine regimen above.   - continue daily moisturizing skin  Asthma - with recent illness still some residual symptoms - for next 2 weeks take Trelegy 1 puff once a day.   Once completed go back to your Breo 200mcg 1 puff once a day. Rinse mouth after use.  - albuterol  as needed 2 puffs every 4-6 hours for cough, wheeze, chest tightness, difficulty breathing - continue singulair  10mg  at bedtime daily  Asthma control goals:  Full participation in all desired activities (may need albuterol  before activity) Albuterol  use two time or less a week on average (not counting use with activity) Cough interfering with sleep two time or less a month Oral steroids no more than once a year No hospitalizations  Allergic rhinitis - continue singulair  as above - use Allegra  180mg  or Zyrtec  10mg  daily as needed - use nasal Atrovent  1-2 sprays each nostril up to 3-4 times a day as needed for nasal drainage or congestion.     Follow-up in 6 months or sooner if needed  I appreciate the opportunity to take part in Kaislee's care. Please do not hesitate to contact me with questions.  Sincerely,   Danita Brain, MD Allergy /Immunology [facility]

## 2023-03-23 NOTE — Patient Instructions (Addendum)
 Urticaria, chronic - improved/resolved for now - etiology of hives likely with a allergen component.  Hives can be caused by a variety of different triggers including illness/infection, foods, medications, stings, exercise, pressure, vibrations, extremes of temperature to name a few however majority of the time there is no identifiable trigger.   - if hives return take high-dose antihistamine regimen:  Allegra  in AM, Zyrtec  or Allegra  in PM, Pepcid  20mg  1 tab twice a day in AM and in PM, Singulair  daily.   Can continue use of hydroxyzine  25mg  at bedtime.   - we have discussed previously Xolair monthly injections for hive control if hives are not controlled with high-dose antihistamine regimen above.   - continue daily moisturizing skin  Asthma - with recent illness still some residual symptoms - for next 2 weeks take Trelegy 1 puff once a day.   Once completed go back to your Breo 200mcg 1 puff once a day. Rinse mouth after use.  - albuterol  as needed 2 puffs every 4-6 hours for cough, wheeze, chest tightness, difficulty breathing - continue singulair  10mg  at bedtime daily  Asthma control goals:  Full participation in all desired activities (may need albuterol  before activity) Albuterol  use two time or less a week on average (not counting use with activity) Cough interfering with sleep two time or less a month Oral steroids no more than once a year No hospitalizations  Allergies - continue singulair  as above - use Allegra  180mg  or Zyrtec  10mg  daily as needed - use nasal Atrovent  1-2 sprays each nostril up to 3-4 times a day as needed for nasal drainage or congestion.     Follow-up in 6 months or sooner if needed

## 2023-03-27 NOTE — Therapy (Deleted)
 OUTPATIENT PHYSICAL THERAPY FEMALE PELVIC EVALUATION   Patient Name: Olivia Paul MRN: [id number] DOB:[birth year], 68 y.o., female Today's Date: 03/27/2023  END OF SESSION:   Past Medical History:  Diagnosis Date   Anxiety    Arthritis    lt knee   Asthma 2011   Complication of anesthesia    Fatty liver    GERD (gastroesophageal reflux disease)    Headache    sinus   History of COVID-19 10/11/2022   PONV (postoperative nausea and vomiting)    Pre-diabetes    Sleep apnea    cpap set on 4   Vitamin D deficiency    Wears glasses    Past Surgical History:  Procedure Laterality Date   APPENDECTOMY  1986   BREAST BIOPSY     BREAST EXCISIONAL BIOPSY     CYSTOSCOPY N/A 11/08/2022   Procedure: CYSTOSCOPY;  Surgeon: Marguerita Beards, MD;  Location: [facility];  Service: Gynecology;  Laterality: N/A;   KNEE ARTHROSCOPY WITH MENISCAL REPAIR Left 02/21/2018   LIPOMA EXCISION     ROBOTIC ASSISTED LAPAROSCOPIC SACROCOLPOPEXY N/A 11/08/2022   Procedure: XI ROBOTIC ASSISTED LAPAROSCOPIC SACROCOLPOPEXY;  Surgeon: Marguerita Beards, MD;  Location: [facility];  Service: Gynecology;  Laterality: N/A;   TOTAL KNEE ARTHROPLASTY Left 11/16/2018   Procedure: TOTAL KNEE ARTHROPLASTY;  Surgeon: Jodi Geralds, MD;  Location: [facility];  Service: Orthopedics;  Laterality: Left;   total knee arthroplasty-revision Left 08/31/2020   Dr, Sherilyn Banker   TRIGGER FINGER RELEASE Right 12/25/2020   VAGINAL HYSTERECTOMY  1986   VAGINAL PROLAPSE REPAIR     1986   XI ROBOTIC ASSISTED OOPHORECTOMY Right 11/08/2022   Procedure: XI ROBOTIC ASSISTED OOPHORECTOMY;  Surgeon: Marguerita Beards, MD;  Location: [facility];  Service: Gynecology;  Laterality: Right;   Patient Active Problem List   Diagnosis Date Noted   Vaginal vault prolapse after hysterectomy 11/08/2022   COVID-19 10/18/2022   Cough in adult 10/18/2022   Pure hypercholesterolemia  05/22/2022   Prediabetes 05/22/2022   OSA on CPAP 05/22/2022   Mood disorder (HCC) 12/27/2021   Urinary retention 09/02/2021   Menopause 09/02/2021   OAB (overactive bladder) 09/02/2021   Daytime sleepiness 07/02/2021   Insomnia 07/02/2021   Episodic tension-type headache, not intractable 03/31/2021   Lumbar radiculopathy 01/29/2021   Acquired trigger finger of right middle finger 11/26/2020   Impingement syndrome of right shoulder region 10/22/2020   Cervical radiculopathy 10/16/2020   Dizziness 08/02/2020   Class 2 severe obesity due to excess calories with serious comorbidity and body mass index (BMI) of 38.0 to 38.9 in adult [facility]) 08/02/2020   Family history of heart disease 08/02/2020   Primary osteoarthritis of left knee 11/16/2018   Unilateral primary osteoarthritis, left knee 10/29/2018   Xanthelasma of right eyelid 03/26/2018   Anxiety 03/26/2018   Perennial allergic rhinitis 02/02/2018   Vitamin D deficiency 07/27/2017   Moderate persistent asthma, uncomplicated 05/11/2017   Drug allergy 05/11/2017   Basal cell papilloma 03/20/2015   Changing skin lesion 01/13/2015   Asthma 11/05/2014   Allergic rhinoconjunctivitis 11/05/2014    PCP: Dorothyann Peng, MD  REFERRING PROVIDER: Marguerita Beards, MD   REFERRING DIAG:  N32.81 (ICD-10-CM) - OAB (overactive bladder)  W09.811 (ICD-10-CM) - Levator spasm  R15.9 (ICD-10-CM) - Incontinence of feces, unspecified fecal incontinence type    THERAPY DIAG:  No diagnosis found.  Rationale for Evaluation and Treatment: Rehabilitation  ONSET DATE: ***  SUBJECTIVE:  SUBJECTIVE STATEMENT: *** Fluid intake: {Yes/No:304960894}   PAIN:  Are you having pain? {yes/no:20286} NPRS scale: ***/10 Pain location: {pelvic pain  location:27098}  Pain type: {type:313116} Pain description: {PAIN DESCRIPTION:21022940}   Aggravating factors: *** Relieving factors: ***  PRECAUTIONS: {Therapy precautions:24002}  RED FLAGS: {PT Red Flags:29287}   WEIGHT BEARING RESTRICTIONS: {Yes ***/No:24003}  FALLS:  Has patient fallen in last 6 months? {fallsyesno:27318}  LIVING ENVIRONMENT: Lives with: {OPRC lives with:25569::"lives with their family"} Lives in: {Lives in:25570} Stairs: {opstairs:27293} Has following equipment at home: {Assistive devices:23999}  OCCUPATION: ***  PLOF: {PLOF:24004}  PATIENT GOALS: ***  PERTINENT HISTORY:  s/p Robotic assisted sacrocolpopexy, right salpingo- oophorectomy, cystoscopy on 11/08/22  Sexual abuse: {Yes/No:304960894}  BOWEL MOVEMENT: Pain with bowel movement: {yes/no:20286} Type of bowel movement:{PT BM type:27100} Fully empty rectum: {Yes/No:304960894} Leakage: {Yes/No:304960894} Pads: {Yes/No:304960894} Fiber supplement: {Yes/No:304960894}  URINATION: Pain with urination: {yes/no:20286} Fully empty bladder: {Yes/No:304960894} Stream: {PT urination:27102} Urgency: {Yes/No:304960894} Frequency: *** Leakage: {PT leakage:27103} Pads: {Yes/No:304960894}  INTERCOURSE: Pain with intercourse: {pain with intercourse PA:27099} Ability to have vaginal penetration:  {Yes/No:304960894} Climax: *** Marinoff Scale: ***/3  PREGNANCY: Vaginal deliveries *** Tearing {Yes***/No:304960894} C-section deliveries *** Currently pregnant {Yes***/No:304960894}  PROLAPSE: {PT prolapse:27101}   OBJECTIVE:  Note: Objective measures were completed at Evaluation unless otherwise noted.  DIAGNOSTIC FINDINGS:  ***  PATIENT SURVEYS:  {rehab surveys:24030}  PFIQ-7 ***  COGNITION: Overall cognitive status: {cognition:24006}     SENSATION: Light touch: {intact/deficits:24005} Proprioception: {intact/deficits:24005}  MUSCLE LENGTH: Hamstrings: Right *** deg; Left ***  deg Thomas test: Right *** deg; Left *** deg  LUMBAR SPECIAL TESTS:  {lumbar special test:25242}  FUNCTIONAL TESTS:  {Functional tests:24029}  GAIT: Distance walked: *** Assistive device utilized: {Assistive devices:23999} Level of assistance: {Levels of assistance:24026} Comments: ***  POSTURE: {posture:25561}  PELVIC ALIGNMENT:  LUMBARAROM/PROM:  A/PROM A/PROM  eval  Flexion   Extension   Right lateral flexion   Left lateral flexion   Right rotation   Left rotation    (Blank rows = not tested)  LOWER EXTREMITY ROM:  {AROM/PROM:27142} ROM Right eval Left eval  Hip flexion    Hip extension    Hip abduction    Hip adduction    Hip internal rotation    Hip external rotation    Knee flexion    Knee extension    Ankle dorsiflexion    Ankle plantarflexion    Ankle inversion    Ankle eversion     (Blank rows = not tested)  LOWER EXTREMITY MMT:  MMT Right eval Left eval  Hip flexion    Hip extension    Hip abduction    Hip adduction    Hip internal rotation    Hip external rotation    Knee flexion    Knee extension    Ankle dorsiflexion    Ankle plantarflexion    Ankle inversion    Ankle eversion     PALPATION:   General  ***                External Perineal Exam ***                             Internal Pelvic Floor ***  Patient confirms identification and approves PT to assess internal pelvic floor and treatment {yes/no:20286}  PELVIC MMT:   MMT eval  Vaginal   Internal Anal Sphincter   External Anal Sphincter   Puborectalis   Diastasis Recti   (Blank rows =  not tested)        TONE: ***  PROLAPSE: ***  TODAY'S TREATMENT:                                                                                                                              DATE: ***  EVAL ***   PATIENT EDUCATION:  Education details: *** Person educated: {Person educated:25204} Education method: {Education Method:25205} Education comprehension:  {Education Comprehension:25206}  HOME EXERCISE PROGRAM: ***  ASSESSMENT:  CLINICAL IMPRESSION: Patient is a *** y.o. *** who was seen today for physical therapy evaluation and treatment for ***.   OBJECTIVE IMPAIRMENTS: {opptimpairments:25111}.   ACTIVITY LIMITATIONS: {activitylimitations:27494}  PARTICIPATION LIMITATIONS: {participationrestrictions:25113}  PERSONAL FACTORS: {Personal factors:25162} are also affecting patient's functional outcome.   REHAB POTENTIAL: {rehabpotential:25112}  CLINICAL DECISION MAKING: {clinical decision making:25114}  EVALUATION COMPLEXITY: {Evaluation complexity:25115}   GOALS: Goals reviewed with patient? {yes/no:20286}  SHORT TERM GOALS: Target date: ***  *** Baseline: Goal status: INITIAL  2.  *** Baseline:  Goal status: INITIAL  3.  *** Baseline:  Goal status: INITIAL  4.  *** Baseline:  Goal status: INITIAL  5.  *** Baseline:  Goal status: INITIAL  6.  *** Baseline:  Goal status: INITIAL  LONG TERM GOALS: Target date: ***  *** Baseline:  Goal status: INITIAL  2.  *** Baseline:  Goal status: INITIAL  3.  *** Baseline:  Goal status: INITIAL  4.  *** Baseline:  Goal status: INITIAL  5.  *** Baseline:  Goal status: INITIAL  6.  *** Baseline:  Goal status: INITIAL  PLAN:  PT FREQUENCY: {rehab frequency:25116}  PT DURATION: {rehab duration:25117}  PLANNED INTERVENTIONS: {rehab planned interventions:25118::"97110-Therapeutic exercises","97530- Therapeutic [phone]- Neuromuscular re-education","97535- Self JXBJ","47829- Manual therapy"}  PLAN FOR NEXT SESSION: ***   Shaquavia Whisonant, PT 03/27/2023, 8:02 AM

## 2023-03-28 ENCOUNTER — Encounter: Payer: Medicare Other | Admitting: Physical Therapy

## 2023-04-03 NOTE — Therapy (Unsigned)
OUTPATIENT PHYSICAL THERAPY FEMALE PELVIC EVALUATION   Patient Name: IRIANNA GILDAY MRN: [id number] DOB:1955-09-21, 68 y.o., female Today's Date: 04/04/2023  END OF SESSION:  PT End of Session - 04/04/23 1027     Visit Number 1    Date for PT Re-Evaluation 06/27/23    Authorization Type [employer] medicare    Authorization - Visit Number 1    Authorization - Number of Visits 10    PT Start Time 1030    PT Stop Time 1115    PT Time Calculation (min) 45 min    Activity Tolerance Patient tolerated treatment well    Behavior During Therapy WFL for tasks assessed/performed             Past Medical History:  Diagnosis Date   Anxiety    Arthritis    lt knee   Asthma 2011   Complication of anesthesia    Fatty liver    GERD (gastroesophageal reflux disease)    Headache    sinus   History of COVID-19 10/11/2022   PONV (postoperative nausea and vomiting)    Pre-diabetes    Sleep apnea    cpap set on 4   Vitamin D deficiency    Wears glasses    Past Surgical History:  Procedure Laterality Date   APPENDECTOMY  1986   BREAST BIOPSY     BREAST EXCISIONAL BIOPSY     CYSTOSCOPY N/A 11/08/2022   Procedure: CYSTOSCOPY;  Surgeon: Marguerita Beards, MD;  Location: [facility] [place];  Service: Gynecology;  Laterality: N/A;   KNEE ARTHROSCOPY WITH MENISCAL REPAIR Left 02/21/2018   LIPOMA EXCISION     ROBOTIC ASSISTED LAPAROSCOPIC SACROCOLPOPEXY N/A 11/08/2022   Procedure: XI ROBOTIC ASSISTED LAPAROSCOPIC SACROCOLPOPEXY;  Surgeon: Marguerita Beards, MD;  Location: [facility];  Service: Gynecology;  Laterality: N/A;   TOTAL KNEE ARTHROPLASTY Left 11/16/2018   Procedure: TOTAL KNEE ARTHROPLASTY;  Surgeon: Jodi Geralds, MD;  Location: [facility];  Service: Orthopedics;  Laterality: Left;   total knee arthroplasty-revision Left 08/31/2020   Dr, Sherilyn Banker   TRIGGER FINGER RELEASE Right 12/25/2020   VAGINAL HYSTERECTOMY  1986   VAGINAL PROLAPSE REPAIR      1986   XI ROBOTIC ASSISTED OOPHORECTOMY Right 11/08/2022   Procedure: XI ROBOTIC ASSISTED OOPHORECTOMY;  Surgeon: Marguerita Beards, MD;  Location: [facility];  Service: Gynecology;  Laterality: Right;   Patient Active Problem List   Diagnosis Date Noted   Vaginal vault prolapse after hysterectomy 11/08/2022   COVID-19 10/18/2022   Cough in adult 10/18/2022   Pure hypercholesterolemia 05/22/2022   Prediabetes 05/22/2022   OSA on CPAP 05/22/2022   Mood disorder (HCC) 12/27/2021   Urinary retention 09/02/2021   Menopause 09/02/2021   OAB (overactive bladder) 09/02/2021   Daytime sleepiness 07/02/2021   Insomnia 07/02/2021   Episodic tension-type headache, not intractable 03/31/2021   Lumbar radiculopathy 01/29/2021   Acquired trigger finger of right middle finger 11/26/2020   Impingement syndrome of right shoulder region 10/22/2020   Cervical radiculopathy 10/16/2020   Dizziness 08/02/2020   Class 2 severe obesity due to excess calories with serious comorbidity and body mass index (BMI) of 38.0 to 38.9 in adult [facility]) 08/02/2020   Family history of heart disease 08/02/2020   Primary osteoarthritis of left knee 11/16/2018   Unilateral primary osteoarthritis, left knee 10/29/2018   Xanthelasma of right eyelid 03/26/2018   Anxiety 03/26/2018   Perennial allergic rhinitis 02/02/2018   Vitamin D deficiency  07/27/2017   Moderate persistent asthma, uncomplicated 05/11/2017   Drug allergy 05/11/2017   Basal cell papilloma 03/20/2015   Changing skin lesion 01/13/2015   Asthma 11/05/2014   Allergic rhinoconjunctivitis 11/05/2014    PCP: Dorothyann Peng, MD  REFERRING PROVIDER: Marguerita Beards, MD   REFERRING DIAG:  N32.81 (ICD-10-CM) - OAB (overactive bladder)  [phone] (ICD-10-CM) - Levator spasm  R15.9 (ICD-10-CM) - Incontinence of feces, unspecified fecal incontinence type    THERAPY DIAG:  Muscle weakness (generalized) - Plan: PT plan of care  cert/re-cert  Other lack of coordination - Plan: PT plan of care cert/re-cert  Rationale for Evaluation and Treatment: Rehabilitation  ONSET DATE: 1983  SUBJECTIVE:                                                                                                                                                                                           SUBJECTIVE STATEMENT: Surgery: s/p Robotic assisted sacrocolpopexy, right salpingo- oophorectomy, cystoscopy on 11/08/22 Patient has had overactive bladder for years.  Fluid intake:   PAIN:  Are you having pain? No   PRECAUTIONS: None  RED FLAGS: None   WEIGHT BEARING RESTRICTIONS: No  FALLS:  Has patient fallen in last 6 months? Yes. Number of falls 1 time and slipped on ice not due to balance.  OCCUPATION: retired  ACTIVITY LEVEL : water aerobics  PLOF: Independent  PATIENT GOALS: reduce urinary leakage, not know where every bathroom is in the store  PERTINENT HISTORY:  s/p Robotic assisted sacrocolpopexy, right salpingo- oophorectomy, cystoscopy on 11/08/22 ; Appendectomy 1986; vaginal prolapse repair with hysterectomy 1986;  Left knee replacement Sexual abuse: No  BOWEL MOVEMENT: Pain with bowel movement: No Type of bowel movement:Type (Bristol Stool Scale) Type 4 and Frequency 1-2 days Fully empty rectum: Yes:   Leakage: Yes: does not always realize she had stool leakage but has not had it in the last several weeks Fiber supplement/laxative Yes  and Metamucil  URINATION: Pain with urination: No Fully empty bladder: Yes:   Stream: Strong Urgency: Yes leaks when pulling her pants down Frequency: average Leakage: Urge to void, Walking to the bathroom, and during the night Pads: Yes: wears a pad when goes out for activity  INTERCOURSE: no activity  Pain with intercourse: none  PREGNANCY: Vaginal deliveries 2 Tearing No Episiotomy Yes   PROLAPSE: None   OBJECTIVE:  Note: Objective measures were completed  at Evaluation unless otherwise noted.  DIAGNOSTIC FINDINGS:  none  PATIENT SURVEYS:  PFIQ-7: 82; UIQ-7 43; CRAIQ-7 43  COGNITION: Overall cognitive status: Within functional limits for tasks assessed     SENSATION: Light touch:  Appears intact   POSTURE: rounded shoulders, forward head, and decreased lumbar lordosis   LUMBARAROM/PROM: Lumbar ROM decreased by 25%   LOWER EXTREMITY ROM:  Passive ROM Right eval Left eval  Hip external rotation 80 50   (Blank rows = not tested)  LOWER EXTREMITY MMT:  MMT Right eval Left eval  Hip flexion 4/5 4/5  Hip extension 3/5 3/5  Hip abduction 3-/5 3/5   (Blank rows = not tested) PALPATION:    Abdominal: Decreased movement of the lower rib cage, tightness in the diaphragm                External Perineal Exam: intact but dry                             Internal Pelvic Floor: tightness throughout the pelvic floor  Patient confirms identification and approves PT to assess internal pelvic floor and treatment Yes  PELVIC MMT:   MMT eval  Vaginal 1/5  Internal Anal Sphincter 1/5  External Anal Sphincter 1/5  Puborectalis 1/5  Diastasis Recti 1/5  (Blank rows = not tested)        TONE: increased  PROLAPSE: none  TODAY'S TREATMENT:                                                                                                                              DATE: 04/04/23  EVAL See below   PATIENT EDUCATION:  04/04/23 Education details: Therapist educated patient on bladder irritants, how to fill out bladder diary, and urge to void Person educated: Patient Education method: Explanation, Demonstration, Tactile cues, Verbal cues, and Handouts Education comprehension: verbalized understanding, returned demonstration, verbal cues required, tactile cues required, and needs further education  HOME EXERCISE PROGRAM: See above  ASSESSMENT:  CLINICAL IMPRESSION: Patient is a 68 y.o. female who was seen today for physical  therapy evaluation and treatment for overactive bladder, levator spasm, and fecal incontinence. Patient had s/p Robotic assisted sacrocolpopexy, right salpingo- oophorectomy, cystoscopy on 11/08/22. She continues to have urinary urgency and fecal leakage after the surgery but not as bad as prior to the surgery. Patient reports she leaks urine with urge to void, walking to the bathroom, and during the night. She will leak stool and not be aware of it. She wears a pad when she is going out somewhere. She has to know where all the bathrooms are when she goes out. Pelvic floor strength is 1/5. She has tightness throughout the pelvic floor  muscles. She reports she will resume using the Estrogen cream for the dryness. Patient has weakness in the hips. She has decreased mobility of the lower rib cage. Patient will benefit from skilled therapy to improve pelvic floor coordination and strength to reduce leakage.   OBJECTIVE IMPAIRMENTS: decreased coordination, decreased ROM, decreased strength, increased fascial restrictions, and increased muscle spasms.   ACTIVITY LIMITATIONS: continence, toileting, and locomotion level  PARTICIPATION LIMITATIONS: driving, shopping, and community activity  PERSONAL FACTORS: Fitness and 1-2 comorbidities: s/p Robotic assisted sacrocolpopexy, right salpingo- oophorectomy, cystoscopy on 11/08/22 ; Appendectomy 1986; vaginal prolapse repair with hysterectomy 1986;  Left knee replacement  are also affecting patient's functional outcome.   REHAB POTENTIAL: Excellent  CLINICAL DECISION MAKING: Evolving/moderate complexity  EVALUATION COMPLEXITY: Moderate   GOALS: Goals reviewed with patient? Yes  SHORT TERM GOALS: Target date: 06/02/23  Patient educated and understands what bladder irritants are and how they affect the bladder.  Baseline: Goal status: INITIAL  2.  Patient educated on urge to void to reduce the urgency.  Baseline:  Goal status: INITIAL  3.  Patient is  able to perform diaphragmatic breathing to relax the pelvic floor.  Baseline:  Goal status: INITIAL   LONG TERM GOALS: Target date: 06/27/23  Patient independent with advanced HEP for core, pelvic floor and hip strength.  Baseline:  Goal status: INITIAL  2.  Patient is able to have the urge to void and walk to the bathroom without leaking urine due to increased in strength.  Baseline:  Goal status: INITIAL  3.  Patient is able to go out in public and not worry where the bathroom are due to her urgency decreased >/= 50%.  Baseline:  Goal status: INITIAL  4.  Patient reports her fecal leakage has improved >/= 75% due to increased in pelvic floor strength >/= 3/5.  Baseline:  Goal status: INITIAL   PLAN:  PT FREQUENCY: 1-2x/week  PT DURATION: 12 weeks  PLANNED INTERVENTIONS: 97110-Therapeutic exercises, 97530- Therapeutic activity, 97112- Neuromuscular re-education, 97535- Self Care, 86578- Manual therapy, [id number]- Electrical stimulation (unattended), [phone]- Electrical stimulation (manual), 97035- Ultrasound, Patient/Family education, Dry Needling, Joint mobilization, Spinal mobilization, Cryotherapy, Moist heat, and Biofeedback  PLAN FOR NEXT SESSION: manual work to the abdomen to work on diaphragmatic breathing, review her bladder diary, go over the urge to void   Eulis Foster, PT 04/04/23 11:48 AM

## 2023-04-04 ENCOUNTER — Other Ambulatory Visit: Payer: Self-pay

## 2023-04-04 ENCOUNTER — Encounter: Payer: Self-pay | Admitting: Physical Therapy

## 2023-04-04 ENCOUNTER — Encounter: Payer: Medicare Other | Attending: Obstetrics and Gynecology | Admitting: Physical Therapy

## 2023-04-04 DIAGNOSIS — M6281 Muscle weakness (generalized): Secondary | ICD-10-CM | POA: Diagnosis present

## 2023-04-04 DIAGNOSIS — R278 Other lack of coordination: Secondary | ICD-10-CM | POA: Insufficient documentation

## 2023-04-04 NOTE — Patient Instructions (Signed)
Urge Incontinence ? ?Ideal urination frequency is every 2-4 wakeful hours, which equates to 5-8 times within a 24-hour period.   ?Urge incontinence is leakage that occurs when the bladder muscle contracts, creating a sudden need to go before getting to the bathroom.   ?Going too often when your bladder isn't actually full can disrupt the body's automatic signals to store and hold urine longer, which will increase urgency/frequency.  In this case, the bladder ?is running the show? and strategies can be learned to retrain this pattern.   ?One should be able to control the first urge to urinate, at around .  The bladder can hold up to a ?grande latte,? or . ?To help you gain control, practice the Urge Drill below when urgency strikes.  This drill will help retrain your bladder signals and allow you to store and hold urine longer.  The overall goal is to stretch out your time between voids to reach a more manageable voiding schedule.    ?Practice your "quick flicks" often throughout the day (each waking hour) even when you don't need feel the urge to go.  This will help strengthen your pelvic floor muscles, making them more effective in controlling leakage. ? ?Urge Drill ? ?When you feel an urge to go, follow these steps to regain control: ?Stop what you are doing and be still ?Take one deep breath, directing your air into your abdomen ?Think an affirming thought, such as ?I've got this.? ?Do 5 quick flicks of your pelvic floor ?Walk with control to the bathroom to void, or delay voiding ?  ?Bladder Irritants ? ?Certain foods and beverages can be irritating to the bladder.  Avoiding these irritants may decrease your symptoms of urinary urgency, frequency or bladder pain.  Even reducing your intake can help with your symptoms.  Not everyone is sensitive to all bladder irritants, so you may consider focusing on one irritant at a time, removing or reducing your intake of that irritant for 7-10 days to see if this  change helps your symptoms.  Water intake is also very important. ? ?Below is a list of bladder irritants. ? ?Drinks: alcohol, carbonated beverages, caffeinated beverages such as coffee and tea, drinks with artificial sweeteners, citrus juices, apple juice, tomato juice ? ?Foods: tomatoes and tomato based foods, spicy food, sugar and artificial sweeteners, vinegar, chocolate, raw onion, apples, citrus fruits, pineapple, cranberries, tomatoes, strawberries, plums, peaches, cantaloupe ? ?Other: acidic urine (too concentrated) - see water intake info below ? ?Substitutes you can try that are NOT irritating to the bladder: cooked onion, pears, papayas, sun-brewed decaf teas, watermelons, non-citrus herbal teas, apricots, kava and low-acid instant drinks (Postum). ? ? ? ?WATER INTAKE: Remember to drink lots of water (aim for fluid intake of half your body weight with 2/3 of fluids being water).  You may be limiting fluids due to fear of leakage, but this can actually worsen urgency symptoms due to highly concentrated urine.  Water helps balance the pH of your urine so it doesn't become too acidic - acidic urine is a bladder irritant! ?Eulis Foster, PT ?[facility] ?[address], [address] ?[place], [place] [address] ?W: [phone] ?[email or web address] ? ? ?

## 2023-04-11 ENCOUNTER — Encounter: Payer: Self-pay | Admitting: Physical Therapy

## 2023-04-11 ENCOUNTER — Encounter: Payer: Medicare Other | Admitting: Physical Therapy

## 2023-04-11 DIAGNOSIS — M6281 Muscle weakness (generalized): Secondary | ICD-10-CM

## 2023-04-11 DIAGNOSIS — R278 Other lack of coordination: Secondary | ICD-10-CM

## 2023-04-11 NOTE — Therapy (Signed)
 OUTPATIENT PHYSICAL THERAPY FEMALE PELVIC TREATMENT   Patient Name: Olivia Paul MRN: [id number] DOB:02-Jun-1955, 68 y.o., female Today's Date: 04/11/2023  END OF SESSION:  PT End of Session - 04/11/23 1027     Visit Number 2    Date for PT Re-Evaluation 06/27/23    Authorization Type [employer] medicare    Authorization Time Period 04/04/2023 - 06/27/2023    Authorization - Visit Number 2    Authorization - Number of Visits 12    Progress Note Due on Visit 10    PT Start Time 1025    PT Stop Time 1115    PT Time Calculation (min) 50 min    Activity Tolerance Patient tolerated treatment well    Behavior During Therapy [facility] for tasks assessed/performed             Past Medical History:  Diagnosis Date   Anxiety    Arthritis    lt knee   Asthma 2011   Complication of anesthesia    Fatty liver    GERD (gastroesophageal reflux disease)    Headache    sinus   History of COVID-19 10/11/2022   PONV (postoperative nausea and vomiting)    Pre-diabetes    Sleep apnea    cpap set on 4   Vitamin D deficiency    Wears glasses    Past Surgical History:  Procedure Laterality Date   APPENDECTOMY  1986   BREAST BIOPSY     BREAST EXCISIONAL BIOPSY     CYSTOSCOPY N/A 11/08/2022   Procedure: CYSTOSCOPY;  Surgeon: Marguerita Beards, MD;  Location: [facility];  Service: Gynecology;  Laterality: N/A;   KNEE ARTHROSCOPY WITH MENISCAL REPAIR Left 02/21/2018   LIPOMA EXCISION     ROBOTIC ASSISTED LAPAROSCOPIC SACROCOLPOPEXY N/A 11/08/2022   Procedure: XI ROBOTIC ASSISTED LAPAROSCOPIC SACROCOLPOPEXY;  Surgeon: Marguerita Beards, MD;  Location: [facility];  Service: Gynecology;  Laterality: N/A;   TOTAL KNEE ARTHROPLASTY Left 11/16/2018   Procedure: TOTAL KNEE ARTHROPLASTY;  Surgeon: Jodi Geralds, MD;  Location: [facility];  Service: Orthopedics;  Laterality: Left;   total knee arthroplasty-revision Left 08/31/2020   Dr, Sherilyn Banker   TRIGGER FINGER  RELEASE Right 12/25/2020   VAGINAL HYSTERECTOMY  1986   VAGINAL PROLAPSE REPAIR     1986   XI ROBOTIC ASSISTED OOPHORECTOMY Right 11/08/2022   Procedure: XI ROBOTIC ASSISTED OOPHORECTOMY;  Surgeon: Marguerita Beards, MD;  Location: [facility];  Service: Gynecology;  Laterality: Right;   Patient Active Problem List   Diagnosis Date Noted   Vaginal vault prolapse after hysterectomy 11/08/2022   COVID-19 10/18/2022   Cough in adult 10/18/2022   Pure hypercholesterolemia 05/22/2022   Prediabetes 05/22/2022   OSA on CPAP 05/22/2022   Mood disorder (HCC) 12/27/2021   Urinary retention 09/02/2021   Menopause 09/02/2021   OAB (overactive bladder) 09/02/2021   Daytime sleepiness 07/02/2021   Insomnia 07/02/2021   Episodic tension-type headache, not intractable 03/31/2021   Lumbar radiculopathy 01/29/2021   Acquired trigger finger of right middle finger 11/26/2020   Impingement syndrome of right shoulder region 10/22/2020   Cervical radiculopathy 10/16/2020   Dizziness 08/02/2020   Class 2 severe obesity due to excess calories with serious comorbidity and body mass index (BMI) of 38.0 to 38.9 in adult [facility]) 08/02/2020   Family history of heart disease 08/02/2020   Primary osteoarthritis of left knee 11/16/2018   Unilateral primary osteoarthritis, left knee 10/29/2018   Xanthelasma of  right eyelid 03/26/2018   Anxiety 03/26/2018   Perennial allergic rhinitis 02/02/2018   Vitamin D deficiency 07/27/2017   Moderate persistent asthma, uncomplicated 05/11/2017   Drug allergy 05/11/2017   Basal cell papilloma 03/20/2015   Changing skin lesion 01/13/2015   Asthma 11/05/2014   Allergic rhinoconjunctivitis 11/05/2014    PCP: Dorothyann Peng, MD  REFERRING PROVIDER: Marguerita Beards, MD   REFERRING DIAG:  N32.81 (ICD-10-CM) - OAB (overactive bladder)  [phone] (ICD-10-CM) - Levator spasm  R15.9 (ICD-10-CM) - Incontinence of feces, unspecified fecal incontinence  type    THERAPY DIAG:  Muscle weakness (generalized)  Other lack of coordination  Rationale for Evaluation and Treatment: Rehabilitation  ONSET DATE: 1983  SUBJECTIVE:                                                                                                                                                                                           SUBJECTIVE STATEMENT: Patient had stool leakage last Friday and was not able to make it to the bathroom in time. I have not had any urinary leakage since the last visit.   Fluid intake:   PAIN:  Are you having pain? No   PRECAUTIONS: None  RED FLAGS: None   WEIGHT BEARING RESTRICTIONS: No  FALLS:  Has patient fallen in last 6 months? Yes. Number of falls 1 time and slipped on ice not due to balance.  OCCUPATION: retired  ACTIVITY LEVEL : water aerobics  PLOF: Independent  PATIENT GOALS: reduce urinary leakage, not know where every bathroom is in the store  PERTINENT HISTORY:  s/p Robotic assisted sacrocolpopexy, right salpingo- oophorectomy, cystoscopy on 11/08/22 ; Appendectomy 1986; vaginal prolapse repair with hysterectomy 1986;  Left knee replacement Sexual abuse: No  BOWEL MOVEMENT: Pain with bowel movement: No Type of bowel movement:Type (Bristol Stool Scale) Type 4 and Frequency 1-2 days Fully empty rectum: Yes:   Leakage: Yes: does not always realize she had stool leakage but has not had it in the last several weeks Fiber supplement/laxative Yes  and Metamucil  URINATION: Pain with urination: No Fully empty bladder: Yes:   Stream: Strong Urgency: Yes leaks when pulling her pants down Frequency: average Leakage: Urge to void, Walking to the bathroom, and during the night Pads: Yes: wears a pad when goes out for activity  INTERCOURSE: no activity  Pain with intercourse: none  PREGNANCY: Vaginal deliveries 2 Tearing No Episiotomy Yes   PROLAPSE: None   OBJECTIVE:  Note: Objective measures  were completed at Evaluation unless otherwise noted.  DIAGNOSTIC FINDINGS:  none  PATIENT SURVEYS:  PFIQ-7: 82; UIQ-7 43; CRAIQ-7 43  COGNITION:  Overall cognitive status: Within functional limits for tasks assessed     SENSATION: Light touch: Appears intact   POSTURE: rounded shoulders, forward head, and decreased lumbar lordosis   LUMBARAROM/PROM: Lumbar ROM decreased by 25%   LOWER EXTREMITY ROM:  Passive ROM Right eval Left eval  Hip external rotation 80 50   (Blank rows = not tested)  LOWER EXTREMITY MMT:  MMT Right eval Left eval  Hip flexion 4/5 4/5  Hip extension 3/5 3/5  Hip abduction 3-/5 3/5   (Blank rows = not tested) PALPATION:    Abdominal: Decreased movement of the lower rib cage, tightness in the diaphragm                External Perineal Exam: intact but dry                             Internal Pelvic Floor: tightness throughout the pelvic floor  Patient confirms identification and approves PT to assess internal pelvic floor and treatment Yes  PELVIC MMT:   MMT eval  Vaginal 1/5  Internal Anal Sphincter 1/5  External Anal Sphincter 1/5  Puborectalis 1/5  Diastasis Recti 1/5  (Blank rows = not tested)        TONE: increased  PROLAPSE: none  TODAY'S TREATMENT:   04/11/23 Manual: Soft tissue mobilization: Circular massage to the abdomen to reduce the hardness of the abdomen Manual work to the diaphragm in supine and sidely Manual work to the right quadratus due to decreased length.  Myofascial release: Fascial release along the lower abdomen, lateral sides, and midline to release the fascial restrictions and increase softness of the tissue Tissue rolling of the lower rib cage.  Neuromuscular re-education: Down training: Diaphragmatic breathing in different positions to relax the pelvic floor and open up the rib cage Childs pose with diaphragmatic breathing to open up the posterior rib cage Self-care: Discussed with patient on  the amount of fluid she is drinking. She drinks 16 ounces of water per day. Discussed how she is to drink more water, less irritants, and stopping after 8:00 PM.  Reviewed the urge to void and she has tried it several times prior to treatment.      PATIENT EDUCATION:  04/11/23 Education details: Therapist educated patient on bladder irritants, how to fill out bladder diary, and urge to voidAccess Code: [email or web address] Person educated: Patient Education method: Explanation, Demonstration, Tactile cues, Verbal cues, and Handouts Education comprehension: verbalized understanding, returned demonstration, verbal cues required, tactile cues required, and needs further education  HOME EXERCISE PROGRAM: 04/11/23 Access Code: [email or web address] URL: [email or web address] Date: 04/11/2023 Prepared by: Eulis Foster  Exercises - Supine Diaphragmatic Breathing  - 2 x daily - 7 x weekly - 1 sets - 10 reps - Diaphragmatic Breathing in Child's Pose with Pelvic Floor Relaxation  - 1 x daily - 7 x weekly - 1 sets - 10 reps  ASSESSMENT:  CLINICAL IMPRESSION: Patient is a 68 y.o. female who was seen today for physical therapy  treatment for overactive bladder, levator spasm, and fecal incontinence. Patient had s/p Robotic assisted sacrocolpopexy, right salpingo- oophorectomy, cystoscopy on 11/08/22. Patient is using her vaginal cream and feels less dry. She has lots of tightness in the diaphragm. She had a softer abdomen after the manual work. She still needs to work on the diaphragmatic breathing. She has not leaked urine since last visit but has had stool leakage.  Patient will benefit from skilled therapy  to improve pelvic floor coordination and strength to reduce leakage.   OBJECTIVE IMPAIRMENTS: decreased coordination, decreased ROM, decreased strength, increased fascial restrictions, and increased muscle spasms.   ACTIVITY LIMITATIONS: continence, toileting, and locomotion level  PARTICIPATION  LIMITATIONS: driving, shopping, and community activity  PERSONAL FACTORS: Fitness and 1-2 comorbidities: s/p Robotic assisted sacrocolpopexy, right salpingo- oophorectomy, cystoscopy on 11/08/22 ; Appendectomy 1986; vaginal prolapse repair with hysterectomy 1986;  Left knee replacement  are also affecting patient's functional outcome.   REHAB POTENTIAL: Excellent  CLINICAL DECISION MAKING: Evolving/moderate complexity  EVALUATION COMPLEXITY: Moderate   GOALS: Goals reviewed with patient? Yes  SHORT TERM GOALS: Target date: 06/02/23  Patient educated and understands what bladder irritants are and how they affect the bladder.  Baseline: Goal status: Met 04/11/23  2.  Patient educated on urge to void to reduce the urgency.  Baseline:  Goal status: Met 04/11/23  3.  Patient is able to perform diaphragmatic breathing to relax the pelvic floor.  Baseline:  Goal status: INITIAL   LONG TERM GOALS: Target date: 06/27/23  Patient independent with advanced HEP for core, pelvic floor and hip strength.  Baseline:  Goal status: INITIAL  2.  Patient is able to have the urge to void and walk to the bathroom without leaking urine due to increased in strength.  Baseline:  Goal status: INITIAL  3.  Patient is able to go out in public and not worry where the bathroom are due to her urgency decreased >/= 50%.  Baseline:  Goal status: INITIAL  4.  Patient reports her fecal leakage has improved >/= 75% due to increased in pelvic floor strength >/= 3/5.  Baseline:  Goal status: INITIAL   PLAN:  PT FREQUENCY: 1-2x/week  PT DURATION: 12 weeks  PLANNED INTERVENTIONS: 97110-Therapeutic exercises, 97530- Therapeutic activity, 97112- Neuromuscular re-education, 97535- Self Care, [id number]- Manual therapy, [id number]- Electrical stimulation (unattended), [id number]- Electrical stimulation (manual), 97035- Ultrasound, Patient/Family education, Dry Needling, Joint mobilization, Spinal mobilization, Cryotherapy,  Moist heat, and Biofeedback  PLAN FOR NEXT SESSION: manual work to the abdomen to work on diaphragmatic breathing, review her bladder diary, hip ER stretch, hip flexor stretch, abdominal contraction   Eulis Foster, PT 04/11/23 11:29 AM

## 2023-04-18 ENCOUNTER — Encounter: Payer: Medicare Other | Attending: Obstetrics and Gynecology | Admitting: Physical Therapy

## 2023-04-18 ENCOUNTER — Encounter: Payer: Self-pay | Admitting: Physical Therapy

## 2023-04-18 DIAGNOSIS — R278 Other lack of coordination: Secondary | ICD-10-CM | POA: Insufficient documentation

## 2023-04-18 DIAGNOSIS — M6281 Muscle weakness (generalized): Secondary | ICD-10-CM | POA: Insufficient documentation

## 2023-04-18 NOTE — Therapy (Signed)
 OUTPATIENT PHYSICAL THERAPY FEMALE PELVIC TREATMENT   Patient Name: Olivia Paul MRN: [id number] DOB:[birth year], 68 y.o., female Today's Date: 04/18/2023  END OF SESSION:  PT End of Session - 04/18/23 1037     Visit Number 3    Date for PT Re-Evaluation 06/27/23    Authorization Type [employer] medicare    Authorization Time Period 04/04/2023 - 06/27/2023    Authorization - Visit Number 3    Authorization - Number of Visits 12    Progress Note Due on Visit 10    PT Start Time 1035    PT Stop Time 1120    PT Time Calculation (min) 45 min    Activity Tolerance Patient tolerated treatment well    Behavior During Therapy [facility] for tasks assessed/performed             Past Medical History:  Diagnosis Date   Anxiety    Arthritis    lt knee   Asthma 2011   Complication of anesthesia    Fatty liver    GERD (gastroesophageal reflux disease)    Headache    sinus   History of COVID-19 10/11/2022   PONV (postoperative nausea and vomiting)    Pre-diabetes    Sleep apnea    cpap set on 4   Vitamin D deficiency    Wears glasses    Past Surgical History:  Procedure Laterality Date   APPENDECTOMY  1986   BREAST BIOPSY     BREAST EXCISIONAL BIOPSY     CYSTOSCOPY N/A 11/08/2022   Procedure: CYSTOSCOPY;  Surgeon: Marguerita Beards, MD;  Location: [facility];  Service: Gynecology;  Laterality: N/A;   KNEE ARTHROSCOPY WITH MENISCAL REPAIR Left 02/21/2018   LIPOMA EXCISION     ROBOTIC ASSISTED LAPAROSCOPIC SACROCOLPOPEXY N/A 11/08/2022   Procedure: XI ROBOTIC ASSISTED LAPAROSCOPIC SACROCOLPOPEXY;  Surgeon: Marguerita Beards, MD;  Location[facility];  Service: Gynecology;  Laterality: N/A;   TOTAL KNEE ARTHROPLASTY Left 11/16/2018   Procedure: TOTAL KNEE ARTHROPLASTY;  Surgeon: Jodi Geralds, MD;  Location: [facility];  Service: Orthopedics;  Laterality: Left;   total knee arthroplasty-revision Left 08/31/2020   Dr, Sherilyn Banker   TRIGGER FINGER  RELEASE Right 12/25/2020   VAGINAL HYSTERECTOMY  1986   VAGINAL PROLAPSE REPAIR     1986   XI ROBOTIC ASSISTED OOPHORECTOMY Right 11/08/2022   Procedure: XI ROBOTIC ASSISTED OOPHORECTOMY;  Surgeon: Marguerita Beards, MD;  Location: [facility];  Service: Gynecology;  Laterality: Right;   Patient Active Problem List   Diagnosis Date Noted   Vaginal vault prolapse after hysterectomy 11/08/2022   COVID-19 10/18/2022   Cough in adult 10/18/2022   Pure hypercholesterolemia 05/22/2022   Prediabetes 05/22/2022   OSA on CPAP 05/22/2022   Mood disorder (HCC) 12/27/2021   Urinary retention 09/02/2021   Menopause 09/02/2021   OAB (overactive bladder) 09/02/2021   Daytime sleepiness 07/02/2021   Insomnia 07/02/2021   Episodic tension-type headache, not intractable 03/31/2021   Lumbar radiculopathy 01/29/2021   Acquired trigger finger of right middle finger 11/26/2020   Impingement syndrome of right shoulder region 10/22/2020   Cervical radiculopathy 10/16/2020   Dizziness 08/02/2020   Class 2 severe obesity due to excess calories with serious comorbidity and body mass index (BMI) of 38.0 to 38.9 in adult [facility]) 08/02/2020   Family history of heart disease 08/02/2020   Primary osteoarthritis of left knee 11/16/2018   Unilateral primary osteoarthritis, left knee 10/29/2018   Xanthelasma of  right eyelid 03/26/2018   Anxiety 03/26/2018   Perennial allergic rhinitis 02/02/2018   Vitamin D deficiency 07/27/2017   Moderate persistent asthma, uncomplicated 05/11/2017   Drug allergy 05/11/2017   Basal cell papilloma 03/20/2015   Changing skin lesion 01/13/2015   Asthma 11/05/2014   Allergic rhinoconjunctivitis 11/05/2014    PCP: Dorothyann Peng, MD  REFERRING PROVIDER: Marguerita Beards, MD   REFERRING DIAG:  N32.81 (ICD-10-CM) - OAB (overactive bladder)  [phone] (ICD-10-CM) - Levator spasm  R15.9 (ICD-10-CM) - Incontinence of feces, unspecified fecal incontinence  type    THERAPY DIAG:  Muscle weakness (generalized)  Other lack of coordination  Rationale for Evaluation and Treatment: Rehabilitation  ONSET DATE: 1983  SUBJECTIVE:                                                                                                                                                                                           SUBJECTIVE STATEMENT: I still have issue with stools. I feel the diet contributes. Patient will wake up with some leakage. I am drinking more water now. I am doing my water aerobics and line dancing. Patient has been able to use the urge to void behavioral technique.   PAIN:  Are you having pain? No   PRECAUTIONS: None  RED FLAGS: None   WEIGHT BEARING RESTRICTIONS: No  FALLS:  Has patient fallen in last 6 months? Yes. Number of falls 1 time and slipped on ice not due to balance.  OCCUPATION: retired  ACTIVITY LEVEL : water aerobics  PLOF: Independent  PATIENT GOALS: reduce urinary leakage, not know where every bathroom is in the store  PERTINENT HISTORY:  s/p Robotic assisted sacrocolpopexy, right salpingo- oophorectomy, cystoscopy on 11/08/22 ; Appendectomy 1986; vaginal prolapse repair with hysterectomy 1986;  Left knee replacement Sexual abuse: No  BOWEL MOVEMENT: Pain with bowel movement: No Type of bowel movement:Type (Bristol Stool Scale) Type 4 and Frequency 1-2 days Fully empty rectum: Yes:   Leakage: Yes: does not always realize she had stool leakage but has not had it in the last several weeks Fiber supplement/laxative Yes  and Metamucil  URINATION: Pain with urination: No Fully empty bladder: Yes:   Stream: Strong Urgency: Yes leaks when pulling her pants down Frequency: average Leakage: Urge to void, Walking to the bathroom, and during the night Pads: Yes: wears a pad when goes out for activity  INTERCOURSE: no activity  Pain with intercourse: none  PREGNANCY: Vaginal deliveries 2 Tearing  No Episiotomy Yes   PROLAPSE: None   OBJECTIVE:  Note: Objective measures were completed at Evaluation unless otherwise noted.  DIAGNOSTIC FINDINGS:  none  PATIENT SURVEYS:  PFIQ-7: 8; UIQ-7 43; CRAIQ-7 43  COGNITION: Overall cognitive status: Within functional limits for tasks assessed     SENSATION: Light touch: Appears intact   POSTURE: rounded shoulders, forward head, and decreased lumbar lordosis   LUMBARAROM/PROM: Lumbar ROM decreased by 25%   LOWER EXTREMITY ROM:  Passive ROM Right eval Left eval  Hip external rotation 80 50   (Blank rows = not tested)  LOWER EXTREMITY MMT:  MMT Right eval Left eval  Hip flexion 4/5 4/5  Hip extension 3/5 3/5  Hip abduction 3-/5 3/5   (Blank rows = not tested) PALPATION:    Abdominal: Decreased movement of the lower rib cage, tightness in the diaphragm                External Perineal Exam: intact but dry                             Internal Pelvic Floor: tightness throughout the pelvic floor  Patient confirms identification and approves PT to assess internal pelvic floor and treatment Yes  PELVIC MMT:   MMT eval 04/18/23  Vaginal 1/5 2/5  Internal Anal Sphincter 1/5   External Anal Sphincter 1/5   Puborectalis 1/5   Diastasis Recti 1/5   (Blank rows = not tested)        TONE: increased  PROLAPSE: none  TODAY'S TREATMENT:   04/18/23 Manual: Myofascial release: Fascial release of the urogenital diaphragm going through the restrictions Internal pelvic floor techniques: No emotional/communication barriers or cognitive limitation. Patient is motivated to learn. Patient understands and agrees with treatment goals and plan. PT explains patient will be examined in standing, sitting, and lying down to see how their muscles and joints work. When they are ready, they will be asked to remove their underwear so PT can examine their perineum. The patient is also given the option of providing their own chaperone as  one is not provided in our facility. The patient also has the right and is explained the right to defer or refuse any part of the evaluation or treatment including the internal exam. With the patient's consent, PT will use one gloved finger to gently assess the muscles of the pelvic floor, seeing how well it contracts and relaxes and if there is muscle symmetry. After, the patient will get dressed and PT and patient will discuss exam findings and plan of care. PT and patient discuss plan of care, schedule, attendance policy and HEP activities.  Going through the vaginal canal working on the levator ani, sides of the introitus, posterior vaginal canal, and the perineal body Neuromuscular re-education: Pelvic floor contraction training: Therapist finger in the vaginal canal working on contraction giving tactile cues to the muscles and patient on what a contraction is and holding for 6 seconds Down training: Diaphragmatic breathing with focus to place air into the abdomen and pelvic floor in supine   04/11/23 Manual: Soft tissue mobilization: Circular massage to the abdomen to reduce the hardness of the abdomen Manual work to the diaphragm in supine and sidely Manual work to the right quadratus due to decreased length.  Myofascial release: Fascial release along the lower abdomen, lateral sides, and midline to release the fascial restrictions and increase softness of the tissue Tissue rolling of the lower rib cage.  Neuromuscular re-education: Down training: Diaphragmatic breathing in different positions to relax the pelvic floor and open up the rib cage Childs pose with  diaphragmatic breathing to open up the posterior rib cage Self-care: Discussed with patient on the amount of fluid she is drinking. She drinks 16 ounces of water per day. Discussed how she is to drink more water, less irritants, and stopping after 8:00 PM.  Reviewed the urge to void and she has tried it several times prior to  treatment.      PATIENT EDUCATION:  04/11/23 Education details: Therapist educated patient on bladder irritants, how to fill out bladder diary, and urge to voidAccess Code: [email or web address] Person educated: Patient Education method: Explanation, Demonstration, Tactile cues, Verbal cues, and Handouts Education comprehension: verbalized understanding, returned demonstration, verbal cues required, tactile cues required, and needs further education  HOME EXERCISE PROGRAM: 04/11/23 Access Code: [email or web address] URL: [email or web address] Date: 04/11/2023 Prepared by: Eulis Foster  Exercises - Supine Diaphragmatic Breathing  - 2 x daily - 7 x weekly - 1 sets - 10 reps - Diaphragmatic Breathing in Child's Pose with Pelvic Floor Relaxation  - 1 x daily - 7 x weekly - 1 sets - 10 reps  ASSESSMENT:  CLINICAL IMPRESSION: Patient is a 68 y.o. female who was seen today for physical therapy  treatment for overactive bladder, levator spasm, and fecal incontinence. Patient had s/p Robotic assisted sacrocolpopexy, right salpingo- oophorectomy, cystoscopy on 11/08/22. She will leak during the night. She is drinking more water and not having to go to the bathroom much. Pelvic floor strength increased to 2/5. She is increasing her activity with water aerobics and line dancing. She has been using the urge to void and it has been helping.   Patient will benefit from skilled therapy to improve pelvic floor coordination and strength to reduce leakage.   OBJECTIVE IMPAIRMENTS: decreased coordination, decreased ROM, decreased strength, increased fascial restrictions, and increased muscle spasms.   ACTIVITY LIMITATIONS: continence, toileting, and locomotion level  PARTICIPATION LIMITATIONS: driving, shopping, and community activity  PERSONAL FACTORS: Fitness and 1-2 comorbidities: s/p Robotic assisted sacrocolpopexy, right salpingo- oophorectomy, cystoscopy on 11/08/22 ; Appendectomy 1986; vaginal prolapse repair  with hysterectomy 1986;  Left knee replacement  are also affecting patient's functional outcome.   REHAB POTENTIAL: Excellent  CLINICAL DECISION MAKING: Evolving/moderate complexity  EVALUATION COMPLEXITY: Moderate   GOALS: Goals reviewed with patient? Yes  SHORT TERM GOALS: Target date: 06/02/23  Patient educated and understands what bladder irritants are and how they affect the bladder.  Baseline: Goal status: Met 04/11/23  2.  Patient educated on urge to void to reduce the urgency.  Baseline:  Goal status: Met 04/11/23  3.  Patient is able to perform diaphragmatic breathing to relax the pelvic floor.  Baseline:  Goal status: Met 04/18/23   LONG TERM GOALS: Target date: 06/27/23  Patient independent with advanced HEP for core, pelvic floor and hip strength.  Baseline:  Goal status: INITIAL  2.  Patient is able to have the urge to void and walk to the bathroom without leaking urine due to increased in strength.  Baseline:  Goal status: INITIAL  3.  Patient is able to go out in public and not worry where the bathroom are due to her urgency decreased >/= 50%.  Baseline:  Goal status: INITIAL  4.  Patient reports her fecal leakage has improved >/= 75% due to increased in pelvic floor strength >/= 3/5.  Baseline:  Goal status: INITIAL   PLAN:  PT FREQUENCY: 1-2x/week  PT DURATION: 12 weeks  PLANNED INTERVENTIONS: 97110-Therapeutic exercises, 97530- Therapeutic activity, [id number]- Neuromuscular re-education, 97535- Self Care, [id number]- Manual therapy, [id number]-  Electrical stimulation (unattended), [id number]- Electrical stimulation (manual), 96045- Ultrasound, Patient/Family education, Dry Needling, Joint mobilization, Spinal mobilization, Cryotherapy, Moist heat, and Biofeedback  PLAN FOR NEXT SESSION:  hip ER stretch, hip flexor stretch, abdominal contraction with pelvic floor   Eulis Foster, PT 04/18/23 11:31 AM

## 2023-04-25 ENCOUNTER — Encounter: Admitting: Physical Therapy

## 2023-04-25 ENCOUNTER — Telehealth: Payer: Self-pay | Admitting: Physical Therapy

## 2023-04-25 NOTE — Telephone Encounter (Signed)
 Called patient about her missed appointment today at 14:00. Patient reports the secretary told her the opening was at 16:00.  Eulis Foster, PT @3 /11/25@ 2:35 PM

## 2023-05-08 ENCOUNTER — Ambulatory Visit (INDEPENDENT_AMBULATORY_CARE_PROVIDER_SITE_OTHER): Payer: Medicare Other | Admitting: Obstetrics and Gynecology

## 2023-05-08 ENCOUNTER — Encounter: Payer: Self-pay | Admitting: Obstetrics and Gynecology

## 2023-05-08 ENCOUNTER — Other Ambulatory Visit (HOSPITAL_COMMUNITY)
Admission: RE | Admit: 2023-05-08 | Discharge: 2023-05-08 | Disposition: A | Discharge status: Home / Self Care | Source: Ambulatory Visit | Attending: Obstetrics and Gynecology | Admitting: Obstetrics and Gynecology

## 2023-05-08 VITALS — BP 111/71 | HR 81

## 2023-05-08 DIAGNOSIS — R159 Full incontinence of feces: Secondary | ICD-10-CM

## 2023-05-08 DIAGNOSIS — N898 Other specified noninflammatory disorders of vagina: Secondary | ICD-10-CM

## 2023-05-08 DIAGNOSIS — R350 Frequency of micturition: Secondary | ICD-10-CM | POA: Diagnosis not present

## 2023-05-08 LAB — POCT URINALYSIS DIPSTICK
Bilirubin, UA: NEGATIVE
Blood, UA: NEGATIVE
Glucose, UA: NEGATIVE
Ketones, UA: NEGATIVE
Leukocytes, UA: NEGATIVE
Nitrite, UA: NEGATIVE
Protein, UA: NEGATIVE
Spec Grav, UA: 1.025
Urobilinogen, UA: 0.2 U/dL
pH, UA: 6.5

## 2023-05-08 NOTE — Patient Instructions (Signed)
 Restart metamucil daily for bowel leakage.

## 2023-05-08 NOTE — Progress Notes (Signed)
 [facility]  Date of Visit: 05/08/2023  History of Present Illness: [name] is a 68 y.o. female scheduled today for a post-operative visit.   Surgery: s/p Robotic assisted sacrocolpopexy, right salpingo- oophorectomy, cystoscopy on 11/08/22  She started pelvic PT. Feels that her urinary leakage has improved with Gemtesa. She is not always running to the bathroom.   She has not been taking metamucil and bowel leakage has some back slightly. Has started pelvic PT a few weeks ago.   Has noticed some vaginal discharge.    Medications: She has a current medication list which includes the following prescription(s): acetaminophen, albuterol, albuterol, vitamin c, atorvastatin, benzonatate, breo ellipta, cetirizine, vitamin d, clobetasol cream, docusate sodium, duloxetine, epinephrine, famotidine, fexofenadine, gabapentin, hydroxyzine, ipratropium, omega-3, magnesium oxide, meclizine, menthol-methyl salicylate, montelukast, nitrofurantoin, ondansetron, pantoprazole, tizanidine, tramadol, triamcinolone cream, turmeric, gemtesa, vitamin e, vitamin k, and zonisamide.   Allergies: Patient is allergic to meloxicam, nsaids, sulfa antibiotics, cat dander, pollen extract, and erythromycin.   Physical Exam: BP 111/71   Pulse 81    Pelvic exam- Normal external genitalia. On speculum, normal vaginal mucosa. On bimanual, no masses present. No visible or palpable mesh. Aptima swab obtained  ---------------------------------------------------------  Assessment and Plan:  1. Vaginal discharge   2. Urinary frequency   3. Incontinence of feces, unspecified fecal incontinence type     - Restart Metamucil daily for stool bulking and prevention of constipation. If no improvement with pelvic PT, then she wants to consider sacral nerve stimulation.  - Continue with Gemtesa daily - Aptima swab sent for discharge.   Return 6 months  Marguerita Beards, MD

## 2023-05-09 ENCOUNTER — Encounter: Payer: Self-pay | Admitting: Obstetrics and Gynecology

## 2023-05-09 LAB — CERVICOVAGINAL ANCILLARY ONLY
Bacterial Vaginitis (gardnerella): NEGATIVE
Candida Glabrata: NEGATIVE
Candida Vaginitis: NEGATIVE
Comment: NEGATIVE
Comment: NEGATIVE
Comment: NEGATIVE

## 2023-05-11 ENCOUNTER — Encounter: Payer: Self-pay | Admitting: Physical Therapy

## 2023-05-11 ENCOUNTER — Encounter: Admitting: Physical Therapy

## 2023-05-11 DIAGNOSIS — M6281 Muscle weakness (generalized): Secondary | ICD-10-CM

## 2023-05-11 DIAGNOSIS — R278 Other lack of coordination: Secondary | ICD-10-CM

## 2023-05-11 NOTE — Therapy (Signed)
 OUTPATIENT PHYSICAL THERAPY FEMALE PELVIC TREATMENT   Patient Name: Olivia Paul MRN: [id number] DOB:May 14, 1955, 68 y.o., female Today's Date: 05/11/2023  END OF SESSION:  PT End of Session - 05/11/23 0837     Visit Number 4    Date for PT Re-Evaluation 06/27/23    Authorization Type [employer] medicare    Authorization Time Period 04/04/2023 - 06/27/2023    Authorization - Visit Number 4    Authorization - Number of Visits 12    Progress Note Due on Visit 10    PT Start Time 0830    PT Stop Time 0915    PT Time Calculation (min) 45 min    Activity Tolerance Patient tolerated treatment well    Behavior During Therapy [facility] for tasks assessed/performed             Past Medical History:  Diagnosis Date   Anxiety    Arthritis    lt knee   Asthma 2011   Complication of anesthesia    Fatty liver    GERD (gastroesophageal reflux disease)    Headache    sinus   History of COVID-19 10/11/2022   PONV (postoperative nausea and vomiting)    Pre-diabetes    Sleep apnea    cpap set on 4   Vitamin D deficiency    Wears glasses    Past Surgical History:  Procedure Laterality Date   APPENDECTOMY  1986   BREAST BIOPSY     BREAST EXCISIONAL BIOPSY     CYSTOSCOPY N/A 11/08/2022   Procedure: CYSTOSCOPY;  Surgeon: Marguerita Beards, MD;  Location: [facility];  Service: Gynecology;  Laterality: N/A;   KNEE ARTHROSCOPY WITH MENISCAL REPAIR Left 02/21/2018   LIPOMA EXCISION     ROBOTIC ASSISTED LAPAROSCOPIC SACROCOLPOPEXY N/A 11/08/2022   Procedure: XI ROBOTIC ASSISTED LAPAROSCOPIC SACROCOLPOPEXY;  Surgeon: Marguerita Beards, MD;  Location: [facility];  Service: Gynecology;  Laterality: N/A;   TOTAL KNEE ARTHROPLASTY Left 11/16/2018   Procedure: TOTAL KNEE ARTHROPLASTY;  Surgeon: Jodi Geralds, MD;  Location: [facility];  Service: Orthopedics;  Laterality: Left;   total knee arthroplasty-revision Left 08/31/2020   Dr, Sherilyn Banker   TRIGGER FINGER  RELEASE Right 12/25/2020   VAGINAL HYSTERECTOMY  1986   VAGINAL PROLAPSE REPAIR     1986   XI ROBOTIC ASSISTED OOPHORECTOMY Right 11/08/2022   Procedure: XI ROBOTIC ASSISTED OOPHORECTOMY;  Surgeon: Marguerita Beards, MD;  Location: [facility];  Service: Gynecology;  Laterality: Right;   Patient Active Problem List   Diagnosis Date Noted   Vaginal vault prolapse after hysterectomy 11/08/2022   COVID-19 10/18/2022   Cough in adult 10/18/2022   Pure hypercholesterolemia 05/22/2022   Prediabetes 05/22/2022   OSA on CPAP 05/22/2022   Mood disorder (HCC) 12/27/2021   Urinary retention 09/02/2021   Menopause 09/02/2021   OAB (overactive bladder) 09/02/2021   Daytime sleepiness 07/02/2021   Insomnia 07/02/2021   Episodic tension-type headache, not intractable 03/31/2021   Lumbar radiculopathy 01/29/2021   Acquired trigger finger of right middle finger 11/26/2020   Impingement syndrome of right shoulder region 10/22/2020   Cervical radiculopathy 10/16/2020   Dizziness 08/02/2020   Class 2 severe obesity due to excess calories with serious comorbidity and body mass index (BMI) of 38.0 to 38.9 in adult [facility]) 08/02/2020   Family history of heart disease 08/02/2020   Primary osteoarthritis of left knee 11/16/2018   Unilateral primary osteoarthritis, left knee 10/29/2018   Xanthelasma of  right eyelid 03/26/2018   Anxiety 03/26/2018   Perennial allergic rhinitis 02/02/2018   Vitamin D deficiency 07/27/2017   Moderate persistent asthma, uncomplicated 05/11/2017   Drug allergy 05/11/2017   Basal cell papilloma 03/20/2015   Changing skin lesion 01/13/2015   Asthma 11/05/2014   Allergic rhinoconjunctivitis 11/05/2014    PCP: Dorothyann Peng, MD  REFERRING PROVIDER: Marguerita Beards, MD   REFERRING DIAG:  N32.81 (ICD-10-CM) - OAB (overactive bladder)  [phone] (ICD-10-CM) - Levator spasm  R15.9 (ICD-10-CM) - Incontinence of feces, unspecified fecal incontinence  type    THERAPY DIAG:  Muscle weakness (generalized)  Other lack of coordination  Rationale for Evaluation and Treatment: Rehabilitation  ONSET DATE: 1983  SUBJECTIVE:                                                                                                                                                                                           SUBJECTIVE STATEMENT: I had 2 bowel episodes. MD wants me to start the Metamucil again. My bowel issues are related to diet. My urinary leakage is not as bad as from the eval. I missed last visit due to having Bronchitis.   PAIN:  Are you having pain? No   PRECAUTIONS: None  RED FLAGS: None   WEIGHT BEARING RESTRICTIONS: No  FALLS:  Has patient fallen in last 6 months? Yes. Number of falls 1 time and slipped on ice not due to balance.  OCCUPATION: retired  ACTIVITY LEVEL : water aerobics  PLOF: Independent  PATIENT GOALS: reduce urinary leakage, not know where every bathroom is in the store  PERTINENT HISTORY:  s/p Robotic assisted sacrocolpopexy, right salpingo- oophorectomy, cystoscopy on 11/08/22 ; Appendectomy 1986; vaginal prolapse repair with hysterectomy 1986;  Left knee replacement Sexual abuse: No  BOWEL MOVEMENT: Pain with bowel movement: No Type of bowel movement:Type (Bristol Stool Scale) Type 4 and Frequency 1-2 days Fully empty rectum: Yes:   Leakage: Yes: does not always realize she had stool leakage but has not had it in the last several weeks Fiber supplement/laxative Yes  and Metamucil  URINATION: Pain with urination: No Fully empty bladder: Yes:   Stream: Strong Urgency: Yes leaks when pulling her pants down Frequency: average Leakage: Urge to void, Walking to the bathroom, and during the night Pads: Yes: wears a pad when goes out for activity  INTERCOURSE: no activity  Pain with intercourse: none  PREGNANCY: Vaginal deliveries 2 Tearing No Episiotomy Yes    PROLAPSE: None   OBJECTIVE:  Note: Objective measures were completed at Evaluation unless otherwise noted.  DIAGNOSTIC FINDINGS:  none  PATIENT SURVEYS:  PFIQ-7: 56;  UIQ-7 43; CRAIQ-7 43  COGNITION: Overall cognitive status: Within functional limits for tasks assessed     SENSATION: Light touch: Appears intact   POSTURE: rounded shoulders, forward head, and decreased lumbar lordosis   LUMBARAROM/PROM: Lumbar ROM decreased by 25%   LOWER EXTREMITY ROM:  Passive ROM Right eval Left eval  Hip external rotation 80 50   (Blank rows = not tested)  LOWER EXTREMITY MMT:  MMT Right eval Left eval  Hip flexion 4/5 4/5  Hip extension 3/5 3/5  Hip abduction 3-/5 3/5   (Blank rows = not tested) PALPATION:    Abdominal: Decreased movement of the lower rib cage, tightness in the diaphragm                External Perineal Exam: intact but dry                             Internal Pelvic Floor: tightness throughout the pelvic floor  Patient confirms identification and approves PT to assess internal pelvic floor and treatment Yes  PELVIC MMT:   MMT eval 04/18/23  Vaginal 1/5 2/5  Internal Anal Sphincter 1/5   External Anal Sphincter 1/5   Puborectalis 1/5   Diastasis Recti 1/5   (Blank rows = not tested)        TONE: increased  PROLAPSE: none  TODAY'S TREATMENT:   05/11/23 Manual: Soft tissue mobilization: Soft tissue work to lumbar paraspinals, quadratus, and posterior intercostalis  Spinal mobilization: Pa and rotational mobilization to T8-L5 grade 3 PA mobilization to SI joint Neuromuscular re-education: Down training: Cat cow with tactile cues to move the spine in correct way Marjo Bicker pose with diaphragmatic breathing to relax the pelvic floor Quadruped hip rock to work on hip flexion and feet internally rotated to stretch the pelvic floor Exercises: Stretches/mobility: Sitting piriformis stretch holding 30 sec bil.  Thread the needle to increase  spinal rotation and rib mobility Therapeutic activities: Functional strengthening activities: Squatting with hip flexion without flexion at the waist Dead lifts to work on hip flexion for squatting Education on breathing out with squats to reduce strain on the pelvic floor    04/18/23 Manual: Myofascial release: Fascial release of the urogenital diaphragm going through the restrictions Internal pelvic floor techniques: No emotional/communication barriers or cognitive limitation. Patient is motivated to learn. Patient understands and agrees with treatment goals and plan. PT explains patient will be examined in standing, sitting, and lying down to see how their muscles and joints work. When they are ready, they will be asked to remove their underwear so PT can examine their perineum. The patient is also given the option of providing their own chaperone as one is not provided in our facility. The patient also has the right and is explained the right to defer or refuse any part of the evaluation or treatment including the internal exam. With the patient's consent, PT will use one gloved finger to gently assess the muscles of the pelvic floor, seeing how well it contracts and relaxes and if there is muscle symmetry. After, the patient will get dressed and PT and patient will discuss exam findings and plan of care. PT and patient discuss plan of care, schedule, attendance policy and HEP activities.  Going through the vaginal canal working on the levator ani, sides of the introitus, posterior vaginal canal, and the perineal body Neuromuscular re-education: Pelvic floor contraction training: Therapist finger in the vaginal canal working on contraction  giving tactile cues to the muscles and patient on what a contraction is and holding for 6 seconds Down training: Diaphragmatic breathing with focus to place air into the abdomen and pelvic floor in supine   04/11/23 Manual: Soft tissue  mobilization: Circular massage to the abdomen to reduce the hardness of the abdomen Manual work to the diaphragm in supine and sidely Manual work to the right quadratus due to decreased length.  Myofascial release: Fascial release along the lower abdomen, lateral sides, and midline to release the fascial restrictions and increase softness of the tissue Tissue rolling of the lower rib cage.  Neuromuscular re-education: Down training: Diaphragmatic breathing in different positions to relax the pelvic floor and open up the rib cage Childs pose with diaphragmatic breathing to open up the posterior rib cage Self-care: Discussed with patient on the amount of fluid she is drinking. She drinks 16 ounces of water per day. Discussed how she is to drink more water, less irritants, and stopping after 8:00 PM.  Reviewed the urge to void and she has tried it several times prior to treatment.      PATIENT EDUCATION:  05/11/23 Education details: Therapist educated patient on bladder irritants, how to fill out bladder diary, and urge to voidAccess Code: [email or web address] Person educated: Patient Education method: Explanation, Demonstration, Tactile cues, Verbal cues, and Handouts Education comprehension: verbalized understanding, returned demonstration, verbal cues required, tactile cues required, and needs further education  HOME EXERCISE PROGRAM: 05/11/23 Access Code: [email or web address] URL: [email or web address] Date: 05/11/2023 Prepared by: Eulis Foster  Exercises - Supine Diaphragmatic Breathing  - 2 x daily - 7 x weekly - 1 sets - 10 reps - Diaphragmatic Breathing in Child's Pose with Pelvic Floor Relaxation  - 1 x daily - 7 x weekly - 1 sets - 10 reps - Seated Piriformis Stretch with Trunk Bend  - 1 x daily - 7 x weekly - 1 sets - 2 reps - 30 sec hold - Diaphragmatic Breathing in Child's Pose with Pelvic Floor Relaxation  - 1 x daily - 7 x weekly - 1 sets - 1 reps - 1 min hold - Cat Cow  - 1 x  daily - 7 x weekly - 1 sets - 10 reps - Child's Pose with Thread the Needle  - 1 x daily - 7 x weekly - 1 sets - 4 reps - Squat  - 1 x daily - 7 x weekly - 1 sets - 10 reps - Half Deadlift with Kettlebell  - 1 x daily - 7 x weekly - 1 sets - 10 reps ASSESSMENT:  CLINICAL IMPRESSION: Patient is a 68 y.o. female who was seen today for physical therapy  treatment for overactive bladder, levator spasm, and fecal incontinence. Patient had s/p Robotic assisted sacrocolpopexy, right salpingo- oophorectomy, cystoscopy on 11/08/22. Patient is doing better with the urgency and urinary leakage is 50% better. She does not go to the bathroom at every store she enters.  She is starting to open up her rib cage for diaphragmatic breathing. She is learning how to squat with flexion at hips instead of waist.  Patient will benefit from skilled therapy to improve pelvic floor coordination and strength to reduce leakage.   OBJECTIVE IMPAIRMENTS: decreased coordination, decreased ROM, decreased strength, increased fascial restrictions, and increased muscle spasms.   ACTIVITY LIMITATIONS: continence, toileting, and locomotion level  PARTICIPATION LIMITATIONS: driving, shopping, and community activity  PERSONAL FACTORS: Fitness and 1-2 comorbidities: s/p Robotic assisted sacrocolpopexy, right salpingo- oophorectomy, cystoscopy on 11/08/22 ;  Appendectomy 1986; vaginal prolapse repair with hysterectomy 1986;  Left knee replacement  are also affecting patient's functional outcome.   REHAB POTENTIAL: Excellent  CLINICAL DECISION MAKING: Evolving/moderate complexity  EVALUATION COMPLEXITY: Moderate   GOALS: Goals reviewed with patient? Yes  SHORT TERM GOALS: Target date: 06/02/23  Patient educated and understands what bladder irritants are and how they affect the bladder.  Baseline: Goal status: Met 04/11/23  2.  Patient educated on urge to void to reduce the urgency.  Baseline:  Goal status: Met 04/11/23  3.   Patient is able to perform diaphragmatic breathing to relax the pelvic floor.  Baseline:  Goal status: Met 04/18/23   LONG TERM GOALS: Target date: 06/27/23  Patient independent with advanced HEP for core, pelvic floor and hip strength.  Baseline:  Goal status: INITIAL  2.  Patient is able to have the urge to void and walk to the bathroom without leaking urine due to increased in strength.  Baseline:  Goal status: INITIAL  3.  Patient is able to go out in public and not worry where the bathroom are due to her urgency decreased >/= 50%.  Baseline:  Goal status: INITIAL  4.  Patient reports her fecal leakage has improved >/= 75% due to increased in pelvic floor strength >/= 3/5.  Baseline:  Goal status: INITIAL   PLAN:  PT FREQUENCY: 1-2x/week  PT DURATION: 12 weeks  PLANNED INTERVENTIONS: 97110-Therapeutic exercises, 97530- Therapeutic activity, 97112- Neuromuscular re-education, 97535- Self Care, [id number]- Manual therapy, [id number]- Electrical stimulation (unattended), [phone]- Electrical stimulation (manual), 97035- Ultrasound, Patient/Family education, Dry Needling, Joint mobilization, Spinal mobilization, Cryotherapy, Moist heat, and Biofeedback  PLAN FOR NEXT SESSION:   hip flexor stretch, abdominal contraction with pelvic floor, manual work to the rectum for puborectalis to move forward   Eulis Foster, PT 05/11/23 9:24 AM

## 2023-05-14 ENCOUNTER — Other Ambulatory Visit: Payer: Self-pay | Admitting: Internal Medicine

## 2023-05-16 ENCOUNTER — Encounter: Payer: Self-pay | Admitting: Internal Medicine

## 2023-05-16 ENCOUNTER — Ambulatory Visit (INDEPENDENT_AMBULATORY_CARE_PROVIDER_SITE_OTHER): Payer: Self-pay | Admitting: Internal Medicine

## 2023-05-16 VITALS — BP 130/90 | HR 66 | Temp 98.8°F | Ht 59.0 in | Wt 204.0 lb

## 2023-05-16 DIAGNOSIS — R42 Dizziness and giddiness: Secondary | ICD-10-CM | POA: Diagnosis not present

## 2023-05-16 DIAGNOSIS — Z9989 Dependence on other enabling machines and devices: Secondary | ICD-10-CM

## 2023-05-16 DIAGNOSIS — G4733 Obstructive sleep apnea (adult) (pediatric): Secondary | ICD-10-CM

## 2023-05-16 DIAGNOSIS — R7303 Prediabetes: Secondary | ICD-10-CM | POA: Diagnosis not present

## 2023-05-16 DIAGNOSIS — Z0000 Encounter for general adult medical examination without abnormal findings: Secondary | ICD-10-CM | POA: Diagnosis not present

## 2023-05-16 DIAGNOSIS — Z6841 Body Mass Index (BMI) 40.0 and over, adult: Secondary | ICD-10-CM

## 2023-05-16 DIAGNOSIS — R030 Elevated blood-pressure reading, without diagnosis of hypertension: Secondary | ICD-10-CM | POA: Diagnosis not present

## 2023-05-16 DIAGNOSIS — E7800 Pure hypercholesterolemia, unspecified: Secondary | ICD-10-CM

## 2023-05-16 DIAGNOSIS — E66813 Obesity, class 3: Secondary | ICD-10-CM

## 2023-05-16 NOTE — Progress Notes (Signed)
 I,Jameka J Llittleton, CMA,acting as a Neurosurgeon for Gwynneth Aliment, MD.,have documented all relevant documentation on the behalf of Gwynneth Aliment, MD,as directed by  Gwynneth Aliment, MD while in the presence of Gwynneth Aliment, MD.  Subjective:    Patient ID: Olivia Paul , female    DOB: 1955-03-28 , 68 y.o.   MRN: [id number]  Chief Complaint  Patient presents with   Annual Exam    HPI  She is here today for a full physical examination. She is followed by Dr. Steva Ready for her GYN care. She denies having any headaches, chest pain and shortness of breath. Patient reports she has been feeling lightheaded and dizzy. She does have h/o vertigo; however, she states this feels different. She is not sure what may have triggered her symptoms. She does admit that she is out of duloxetine, she has missed 3 doses. She is not sure if this could have triggered her sx.      Past Medical History:  Diagnosis Date   Anxiety    Arthritis    lt knee   Asthma 2011   Complication of anesthesia    Fatty liver    GERD (gastroesophageal reflux disease)    Headache    sinus   History of COVID-19 10/11/2022   PONV (postoperative nausea and vomiting)    Pre-diabetes    Sleep apnea    cpap set on 4   Vitamin D deficiency    Wears glasses      Family History  Problem Relation Age of Onset   Kidney disease Mother    Hypertension Mother    Allergic rhinitis Mother    Heart disease Father    Allergic rhinitis Father    Asthma Father    Urticaria Sister    Breast cancer Paternal Grandmother    Breast cancer Maternal Aunt    Breast cancer Paternal Aunt    Angioedema Neg Hx    Atopy Neg Hx    Eczema Neg Hx    Immunodeficiency Neg Hx      Current Outpatient Medications:    acetaminophen (TYLENOL) 500 MG tablet, Take 1 tablet (500 mg total) by mouth every 6 (six) hours as needed (pain)., Disp: 30 tablet, Rfl: 0   albuterol (PROVENTIL) (2.5 MG/3ML) 0.083% nebulizer solution, Use 1 vial  every 4-6 hours as needed for cough, wheeze, shortness of breath or chest tightness, Disp: 150 mL, Rfl: 0   albuterol (VENTOLIN HFA) 108 (90 Base) MCG/ACT inhaler, Inhale 2 puffs into the lungs every 4 (four) hours as needed for wheezing or shortness of breath., Disp: 18 g, Rfl: 1   Ascorbic Acid (VITAMIN C) 100 MG tablet, Take 100 mg by mouth daily., Disp: , Rfl:    atorvastatin (LIPITOR) 20 MG tablet, TAKE 1 TABLET BY MOUTH EVERY DAY (Patient taking differently: Take 20 mg by mouth every evening.), Disp: 90 tablet, Rfl: 1   BREO ELLIPTA 200-25 MCG/ACT AEPB, Inhale 1 puff into the lungs daily., Disp: 60 each, Rfl: 5   Cholecalciferol (VITAMIN D) 50 MCG (2000 UT) CAPS, Take 5,000 Units by mouth daily. , Disp: , Rfl:    clobetasol cream (TEMOVATE) 0.05 %, Apply topically 2 (TWO) times a day as needed to areas of ras, Disp: 60 g, Rfl: 1   DULoxetine (CYMBALTA) 60 MG capsule, TAKE 1 CAPSULE BY MOUTH EVERY DAY, Disp: 90 capsule, Rfl: 2   EPINEPHrine 0.3 mg/0.3 mL IJ SOAJ injection, Inject 0.3 mLs (0.3 mg  total) into the muscle as needed for anaphylaxis., Disp: 1 each, Rfl: 1   fexofenadine (ALLEGRA) 180 MG tablet, TAKE ONE TABLET ONCE DAILY FOR RUNNY NOSE OR ITCHING (Patient taking differently: daily. TAKE ONE TABLET ONCE DAILY FOR RUNNY NOSE OR ITCHING), Disp: 90 tablet, Rfl: 1   gabapentin (NEURONTIN) 300 MG capsule, as needed., Disp: , Rfl:    hydrOXYzine (ATARAX) 25 MG tablet, TAKE 1 TABLET BY MOUTH AT BEDTIME AS NEEDED FOR ITCHING., Disp: 30 tablet, Rfl: 0   ipratropium (ATROVENT) 0.06 % nasal spray, 1-2 sprays each nostril up to 3-4 times a day as needed for nasal drainage or congestion, Disp: 15 mL, Rfl: 5   ipratropium-albuterol (DUONEB) 0.5-2.5 (3) MG/3ML SOLN, Inhale 3 mLs into the lungs daily as needed., Disp: , Rfl:    Krill Oil (OMEGA-3) 500 MG CAPS, Take 500 mg by mouth daily., Disp: , Rfl:    magnesium oxide (MAG-OX) 400 MG tablet, Take 400 mg by mouth daily., Disp: , Rfl:    meclizine  (ANTIVERT) 50 MG tablet, Take 1/2 tab po qpm prn, Disp: 30 tablet, Rfl: 0   Menthol-Methyl Salicylate (TIGER BALM LINIMENT EX), Apply 1 application topically at bedtime as needed (pain)., Disp: , Rfl:    montelukast (SINGULAIR) 10 MG tablet, Take 1 tablet (10 mg total) by mouth at bedtime., Disp: 30 tablet, Rfl: 5   pantoprazole (PROTONIX) 40 MG tablet, TAKE 1 TABLET BY MOUTH EVERY DAY (Patient taking differently: at bedtime.), Disp: 90 tablet, Rfl: 1   tiZANidine (ZANAFLEX) 4 MG tablet, Take 4 mg by mouth every 8 (eight) hours as needed., Disp: , Rfl:    traMADol (ULTRAM) 50 MG tablet, Take 50 mg by mouth every 8 (eight) hours as needed for moderate pain. , Disp: , Rfl:    triamcinolone cream (KENALOG) 0.1 %, APPLY TO AFFECTED AREA TWICE DAILY AS NEEDED, Disp: 453.6 g, Rfl: 1   TURMERIC PO, Take by mouth as needed., Disp: , Rfl:    Vibegron (GEMTESA) 75 MG TABS, Take 1 tablet (75 mg total) by mouth daily., Disp: 30 tablet, Rfl: 11   vitamin E 400 UNIT capsule, Take 400 Units by mouth daily., Disp: , Rfl:    vitamin k 100 MCG tablet, Take 100 mcg by mouth daily., Disp: , Rfl:    zonisamide (ZONEGRAN) 50 MG capsule, Take 100 mg by mouth at bedtime., Disp: , Rfl:    cetirizine (ZYRTEC) 10 MG tablet, Take 1 tablet (10 mg total) by mouth daily. (Patient taking differently: Take 10 mg by mouth as needed.), Disp: 30 tablet, Rfl: 5   nitrofurantoin (MACRODANTIN) 50 MG capsule, TAKE 1 CAPSULE BY MOUTH EVERYDAY AT BEDTIME (Patient not taking: Reported on 05/16/2023), Disp: 30 capsule, Rfl: 1   Allergies  Allergen Reactions   Meloxicam Other (See Comments)    Bruised her legs all over     Nsaids Other (See Comments)    Scarring and bruising of legs   Sulfa Antibiotics Other (See Comments)    Mouth broke out in sores   Cat Dander    Pollen Extract    Erythromycin Nausea And Vomiting      The patient states she uses post menopausal status for birth control. No LMP recorded. Patient is postmenopausal..  Negative for Dysmenorrhea. Negative for: breast discharge, breast lump(s), breast pain and breast self exam. Associated symptoms include abnormal vaginal bleeding. Pertinent negatives include abnormal bleeding (hematology), anxiety, decreased libido, depression, difficulty falling sleep, dyspareunia, history of infertility, nocturia, sexual dysfunction,  sleep disturbances, urinary incontinence, urinary urgency, vaginal discharge and vaginal itching. Diet regular.The patient states her exercise level is    . The patient's tobacco use is:  Social History   Tobacco Use  Smoking Status Never   Passive exposure: Past  Smokeless Tobacco Never  . She has been exposed to passive smoke. The patient's alcohol use is:  Social History   Substance and Sexual Activity  Alcohol Use No   Alcohol/week: 0.0 standard drinks of alcohol    Review of Systems  Constitutional: Negative.   HENT: Negative.    Eyes: Negative.   Respiratory: Negative.    Cardiovascular: Negative.   Gastrointestinal: Negative.   Endocrine: Negative.   Genitourinary: Negative.   Musculoskeletal: Negative.   Skin: Negative.   Allergic/Immunologic: Negative.   Neurological:  Positive for dizziness.  Hematological: Negative.   Psychiatric/Behavioral: Negative.       Today's Vitals   05/16/23 0853 05/16/23 0921 05/16/23 0923 05/16/23 0946  BP: 120/64  (!) 144/90 (!) 130/90  Pulse: 64  69 66  Temp: 98.8 F (37.1 C)     TempSrc: Oral     Weight: 204 lb (92.5 kg)     Height:  4\' 11"  (1.499 m)  4\' 11"  (1.499 m)  PainSc: 0-No pain      Body mass index is 41.2 kg/m.  Wt Readings from Last 3 Encounters:  05/16/23 204 lb (92.5 kg)  03/23/23 203 lb 6.4 oz (92.3 kg)  03/15/23 205 lb (93 kg)    BP Readings from Last 3 Encounters:  05/16/23 (!) 130/90  05/08/23 111/71  03/23/23 118/62     Objective:  Physical Exam Vitals and nursing note reviewed.  Constitutional:      Appearance: Normal appearance. She is obese.   HENT:     Head: Normocephalic and atraumatic.     Right Ear: Tympanic membrane, ear canal and external ear normal.     Left Ear: Tympanic membrane, ear canal and external ear normal.     Nose:     Comments: Masked, removed mask for exam    Mouth/Throat:     Pharynx: No oropharyngeal exudate or posterior oropharyngeal erythema.  Eyes:     Extraocular Movements: Extraocular movements intact.     Conjunctiva/sclera: Conjunctivae normal.     Pupils: Pupils are equal, round, and reactive to light.  Cardiovascular:     Rate and Rhythm: Normal rate and regular rhythm.     Pulses: Normal pulses.          Dorsalis pedis pulses are 2+ on the right side and 2+ on the left side.     Heart sounds: Normal heart sounds.  Pulmonary:     Effort: Pulmonary effort is normal.     Breath sounds: Normal breath sounds.  Chest:  Breasts:    Tanner Score is 5.     Right: Normal.     Left: Normal.  Abdominal:     General: Bowel sounds are normal.     Palpations: Abdomen is soft.     Comments: Obese, soft  Genitourinary:    Comments: deferred Musculoskeletal:        General: Normal range of motion.     Cervical back: Normal range of motion and neck supple.     Comments: Healed surgical scar on left knee  Skin:    General: Skin is warm and dry.  Neurological:     General: No focal deficit present.     Mental Status: She  is alert and oriented to person, place, and time.  Psychiatric:        Mood and Affect: Mood normal.        Behavior: Behavior normal.         Assessment And Plan:     Encounter for general adult medical examination w/o abnormal findings Assessment & Plan: A full exam was performed.  Importance of monthly self breast exams was discussed with the patient.  She is advised to get 30-45 minutes of regular exercise, no less than four to five days per week. Both weight-bearing and aerobic exercises are recommended.  She is advised to follow a healthy diet with at least six  fruits/veggies per day, decrease intake of red meat and other saturated fats and to increase fish intake to twice weekly.  Meats/fish should not be fried -- baked, boiled or broiled is preferable. It is also important to cut back on your sugar intake.  Be sure to read labels - try to avoid anything with added sugar, high fructose corn syrup or other sweeteners.  If you must use a sweetener, you can try stevia or monkfruit.  It is also important to avoid artificially sweetened foods/beverages and diet drinks. Lastly, wear SPF 50 sunscreen on exposed skin and when in direct sunlight for an extended period of time.  Be sure to avoid fast food restaurants and aim for at least 60 ounces of water daily.       Elevated blood pressure reading Assessment & Plan: She is encouraged to decrease her sodium intake by decreasing intake of processed meats/foods and avoiding adding salt to her foods. She is reminded that breads/cheese also contain quite a bit of sodium. She will rto in 2 weeks for NV to recheck her BP. She should benefit from [facility] eating plan.    Dizziness Assessment & Plan: It is possible her sx are due to missed doses of duloxetine. It is also possible that her sx are related to her elevated blood pressure. She is encouraged to decrease her sodium intake. She agrees to rto in 2 weeks for re-evaluation. Also agrees to invest in BP machine to follow her BP readings at home.   Orders: -     CBC -     CMP14+EGFR  Prediabetes Assessment & Plan: Previous labs reviewed, her A1c has been elevated in the past. I will check an A1c today. Reminded to avoid refined sugars including sugary drinks/foods and processed meats including bacon, sausages and deli meats.    Orders: -     CMP14+EGFR -     Hemoglobin A1c  Pure hypercholesterolemia Assessment & Plan: Chronic, she is currently on atorvastatin 20mg  daily. Encouraged to follow a heart healthy lifestyle.   Orders: -     CBC -     CMP14+EGFR -      Lipid panel -     TSH  OSA on CPAP Assessment & Plan: Chronic, reminded to wear CPAP at least four hours per night. She has acknowledged improvement in her daytime fatigue with use of OSA. She may also benefit from Zepbound to address OSA, which would help to augment her weight loss efforts.    Class 3 severe obesity due to excess calories with serious comorbidity and body mass index (BMI) of 40.0 to 44.9 in adult [facility]) Assessment & Plan: BMI 41. She is encouraged to initially strive for BMI less than 3 to decrease cardiac risk. Advised to aim for at least 150 minutes of exercise  per week.     Return in 2 weeks (on 05/30/2023), or bp check - NV, for 1 year physical, 6 month bp. Patient was given opportunity to ask questions. Patient verbalized understanding of the plan and was able to repeat key elements of the plan. All questions were answered to their satisfaction.    I, Gwynneth Aliment, MD, have reviewed all documentation for this visit. The documentation on 05/16/23 for the exam, diagnosis, procedures, and orders are all accurate and complete.

## 2023-05-16 NOTE — Assessment & Plan Note (Signed)

## 2023-05-16 NOTE — Assessment & Plan Note (Signed)
 It is possible her sx are due to missed doses of duloxetine. It is also possible that her sx are related to her elevated blood pressure. She is encouraged to decrease her sodium intake. She agrees to rto in 2 weeks for re-evaluation. Also agrees to invest in BP machine to follow her BP readings at home.

## 2023-05-16 NOTE — Assessment & Plan Note (Signed)
 Chronic, she is currently on atorvastatin 20mg  daily. Encouraged to follow a heart healthy lifestyle.

## 2023-05-16 NOTE — Assessment & Plan Note (Signed)
 She is encouraged to decrease her sodium intake by decreasing intake of processed meats/foods and avoiding adding salt to her foods. She is reminded that breads/cheese also contain quite a bit of sodium. She will rto in 2 weeks for NV to recheck her BP. She should benefit from [facility] eating plan.

## 2023-05-16 NOTE — Assessment & Plan Note (Signed)
 Previous labs reviewed, her A1c has been elevated in the past. I will check an A1c today. Reminded to avoid refined sugars including sugary drinks/foods and processed meats including bacon, sausages and deli meats.

## 2023-05-16 NOTE — Assessment & Plan Note (Signed)
 BMI 41. She is encouraged to initially strive for BMI less than 3 to decrease cardiac risk. Advised to aim for at least 150 minutes of exercise per week.

## 2023-05-16 NOTE — Patient Instructions (Signed)

## 2023-05-16 NOTE — Assessment & Plan Note (Addendum)
 Chronic, reminded to wear CPAP at least four hours per night. She has acknowledged improvement in her daytime fatigue with use of OSA. She may also benefit from Zepbound to address OSA, which would help to augment her weight loss efforts.

## 2023-05-17 ENCOUNTER — Encounter: Payer: Self-pay | Admitting: Internal Medicine

## 2023-05-18 LAB — LIPID PANEL
Chol/HDL Ratio: 2.9 ratio (ref 0.0–4.4)
Cholesterol, Total: 128 mg/dL (ref 100–199)
HDL: 44 mg/dL (ref >39)
LDL Chol Calc (NIH): 71 mg/dL (ref 0–99)
Triglycerides: 59 mg/dL (ref 0–149)
VLDL Cholesterol Cal: 13 mg/dL (ref 5–40)

## 2023-05-18 LAB — CMP14+EGFR
ALT: 15 IU/L (ref 0–32)
AST: 20 IU/L (ref 0–40)
Albumin: 4 g/dL (ref 3.9–4.9)
Alkaline Phosphatase: 134 IU/L — ABNORMAL HIGH (ref 44–121)
BUN/Creatinine Ratio: 14 (ref 12–28)
BUN: 11 mg/dL (ref 8–27)
Bilirubin Total: 0.4 mg/dL (ref 0.0–1.2)
CO2: 23 mmol/L (ref 20–29)
Calcium: 9.4 mg/dL (ref 8.7–10.3)
Chloride: 106 mmol/L (ref 96–106)
Creatinine, Ser: 0.79 mg/dL (ref 0.57–1.00)
Globulin, Total: 2.5 g/dL (ref 1.5–4.5)
Glucose: 90 mg/dL (ref 70–99)
Potassium: 4.4 mmol/L (ref 3.5–5.2)
Sodium: 144 mmol/L (ref 134–144)
Total Protein: 6.5 g/dL (ref 6.0–8.5)
eGFR: 82 mL/min/{1.73_m2} (ref >59)

## 2023-05-18 LAB — CBC
Hematocrit: 38 % (ref 34.0–46.6)
Hemoglobin: 12.3 g/dL (ref 11.1–15.9)
MCH: 28 pg (ref 26.6–33.0)
MCHC: 32.4 g/dL (ref 31.5–35.7)
MCV: 87 fL (ref 79–97)
Platelets: 377 10*3/uL (ref 150–450)
RBC: 4.39 x10E6/uL (ref 3.77–5.28)
RDW: 15.3 % (ref 11.7–15.4)
WBC: 14.5 10*3/uL — ABNORMAL HIGH (ref 3.4–10.8)

## 2023-05-18 LAB — TSH: TSH: 1.17 u[IU]/mL (ref 0.450–4.500)

## 2023-05-18 LAB — HEMOGLOBIN A1C
Est. average glucose Bld gHb Est-mCnc: 128 mg/dL
Hgb A1c MFr Bld: 6.1 % — ABNORMAL HIGH (ref 4.8–5.6)

## 2023-05-25 ENCOUNTER — Encounter: Payer: Self-pay | Admitting: Physical Therapy

## 2023-05-25 ENCOUNTER — Encounter: Payer: Self-pay | Attending: Obstetrics and Gynecology | Admitting: Physical Therapy

## 2023-05-25 DIAGNOSIS — M6281 Muscle weakness (generalized): Secondary | ICD-10-CM | POA: Diagnosis present

## 2023-05-25 DIAGNOSIS — R278 Other lack of coordination: Secondary | ICD-10-CM | POA: Diagnosis present

## 2023-05-25 NOTE — Therapy (Signed)
 OUTPATIENT PHYSICAL THERAPY FEMALE PELVIC TREATMENT   Patient Name: Olivia Paul MRN: [id number] DOB:1955-04-18, 68 y.o., female Today's Date: 05/25/2023  END OF SESSION:  PT End of Session - 05/25/23 1403     Visit Number 5    Date for PT Re-Evaluation 06/27/23    Authorization Type [employer] medicare    Authorization Time Period 04/04/2023 - 06/27/2023    Authorization - Visit Number 5    Authorization - Number of Visits 12    PT Start Time 1400    PT Stop Time 1445    PT Time Calculation (min) 45 min    Activity Tolerance Patient tolerated treatment well    Behavior During Therapy [facility] for tasks assessed/performed             Past Medical History:  Diagnosis Date   Anxiety    Arthritis    lt knee   Asthma 2011   Complication of anesthesia    Fatty liver    GERD (gastroesophageal reflux disease)    Headache    sinus   History of COVID-19 10/11/2022   PONV (postoperative nausea and vomiting)    Pre-diabetes    Sleep apnea    cpap set on 4   Vitamin D deficiency    Wears glasses    Past Surgical History:  Procedure Laterality Date   APPENDECTOMY  1986   BREAST BIOPSY     BREAST EXCISIONAL BIOPSY     CYSTOSCOPY N/A 11/08/2022   Procedure: CYSTOSCOPY;  Surgeon: Marguerita Beards, MD;  Location: [facility];  Service: Gynecology;  Laterality: N/A;   KNEE ARTHROSCOPY WITH MENISCAL REPAIR Left 02/21/2018   LIPOMA EXCISION     ROBOTIC ASSISTED LAPAROSCOPIC SACROCOLPOPEXY N/A 11/08/2022   Procedure: XI ROBOTIC ASSISTED LAPAROSCOPIC SACROCOLPOPEXY;  Surgeon: Marguerita Beards, MD;  Location: [facility];  Service: Gynecology;  Laterality: N/A;   TOTAL KNEE ARTHROPLASTY Left 11/16/2018   Procedure: TOTAL KNEE ARTHROPLASTY;  Surgeon: Jodi Geralds, MD;  Location: [facility];  Service: Orthopedics;  Laterality: Left;   total knee arthroplasty-revision Left 08/31/2020   Dr, Sherilyn Banker   TRIGGER FINGER RELEASE Right 12/25/2020    VAGINAL HYSTERECTOMY  1986   VAGINAL PROLAPSE REPAIR     1986   XI ROBOTIC ASSISTED OOPHORECTOMY Right 11/08/2022   Procedure: XI ROBOTIC ASSISTED OOPHORECTOMY;  Surgeon: Marguerita Beards, MD;  Location: [facility];  Service: Gynecology;  Laterality: Right;   Patient Active Problem List   Diagnosis Date Noted   Encounter for general adult medical examination w/o abnormal findings 05/16/2023   Elevated blood pressure reading 05/16/2023   Vaginal vault prolapse after hysterectomy 11/08/2022   COVID-19 10/18/2022   Cough in adult 10/18/2022   Pure hypercholesterolemia 05/22/2022   Prediabetes 05/22/2022   OSA on CPAP 05/22/2022   Mood disorder (HCC) 12/27/2021   Urinary retention 09/02/2021   Menopause 09/02/2021   OAB (overactive bladder) 09/02/2021   Daytime sleepiness 07/02/2021   Insomnia 07/02/2021   Episodic tension-type headache, not intractable 03/31/2021   Lumbar radiculopathy 01/29/2021   Acquired trigger finger of right middle finger 11/26/2020   Impingement syndrome of right shoulder region 10/22/2020   Cervical radiculopathy 10/16/2020   Dizziness 08/02/2020   Class 3 severe obesity due to excess calories with serious comorbidity and body mass index (BMI) of 40.0 to 44.9 in adult [facility]) 08/02/2020   Family history of heart disease 08/02/2020   Primary osteoarthritis of left knee 11/16/2018  Unilateral primary osteoarthritis, left knee 10/29/2018   Xanthelasma of right eyelid 03/26/2018   Anxiety 03/26/2018   Perennial allergic rhinitis 02/02/2018   Vitamin D deficiency 07/27/2017   Moderate persistent asthma, uncomplicated 05/11/2017   Drug allergy 05/11/2017   Basal cell papilloma 03/20/2015   Changing skin lesion 01/13/2015   Asthma 11/05/2014   Allergic rhinoconjunctivitis 11/05/2014    PCP: Dorothyann Peng, MD  REFERRING PROVIDER: Marguerita Beards, MD   REFERRING DIAG:  N32.81 (ICD-10-CM) - OAB (overactive bladder)  [phone]  (ICD-10-CM) - Levator spasm  R15.9 (ICD-10-CM) - Incontinence of feces, unspecified fecal incontinence type    THERAPY DIAG:  Muscle weakness (generalized)  Other lack of coordination  Rationale for Evaluation and Treatment: Rehabilitation  ONSET DATE: 1983  SUBJECTIVE:                                                                                                                                                                                           SUBJECTIVE STATEMENT: The stool leakage is 50% less.    PAIN:  Are you having pain? No   PRECAUTIONS: None  RED FLAGS: None   WEIGHT BEARING RESTRICTIONS: No  FALLS:  Has patient fallen in last 6 months? Yes. Number of falls 1 time and slipped on ice not due to balance.  OCCUPATION: retired  ACTIVITY LEVEL : water aerobics  PLOF: Independent  PATIENT GOALS: reduce urinary leakage, not know where every bathroom is in the store  PERTINENT HISTORY:  s/p Robotic assisted sacrocolpopexy, right salpingo- oophorectomy, cystoscopy on 11/08/22 ; Appendectomy 1986; vaginal prolapse repair with hysterectomy 1986;  Left knee replacement Sexual abuse: No  BOWEL MOVEMENT: Pain with bowel movement: No Type of bowel movement:Type (Bristol Stool Scale) Type 4 and Frequency 1-2 days Fully empty rectum: Yes:   Leakage: Yes: does not always realize she had stool leakage but has not had it in the last several weeks Fiber supplement/laxative Yes  and Metamucil  URINATION: Pain with urination: No Fully empty bladder: Yes:   Stream: Strong Urgency: Yes leaks when pulling her pants down Frequency: average Leakage: Urge to void, Walking to the bathroom, and during the night Pads: Yes: wears a pad when goes out for activity  INTERCOURSE: no activity  Pain with intercourse: none  PREGNANCY: Vaginal deliveries 2 Tearing No Episiotomy Yes   PROLAPSE: None   OBJECTIVE:  Note: Objective measures were completed at Evaluation  unless otherwise noted.  DIAGNOSTIC FINDINGS:  none  PATIENT SURVEYS:  PFIQ-7: 82; UIQ-7 43; CRAIQ-7 43  COGNITION: Overall cognitive status: Within functional limits for tasks assessed     SENSATION: Light touch:  Appears intact   POSTURE: rounded shoulders, forward head, and decreased lumbar lordosis   LUMBARAROM/PROM: Lumbar ROM decreased by 25%   LOWER EXTREMITY ROM:  Passive ROM Right eval Left eval  Hip external rotation 80 50   (Blank rows = not tested)  LOWER EXTREMITY MMT:  MMT Right eval Left eval  Hip flexion 4/5 4/5  Hip extension 3/5 3/5  Hip abduction 3-/5 3/5   (Blank rows = not tested) PALPATION:    Abdominal: Decreased movement of the lower rib cage, tightness in the diaphragm                External Perineal Exam: intact but dry                             Internal Pelvic Floor: tightness throughout the pelvic floor  Patient confirms identification and approves PT to assess internal pelvic floor and treatment Yes  PELVIC MMT:   MMT eval 04/18/23 05/25/23  Vaginal 1/5 2/5   Internal Anal Sphincter 1/5  3/5 weak hug  External Anal Sphincter 1/5  3/5 weak hug  Puborectalis 1/5  3/5 weak hug  Diastasis Recti 1/5  N/a  (Blank rows = not tested)        TONE: increased  PROLAPSE: none  TODAY'S TREATMENT:   05/25/23 Manual: Soft tissue mobilization: Manual work to the left hip, gluteal, left lateral abdominal, and SI joint to elongate the muscles Internal pelvic floor techniques: No emotional/communication barriers or cognitive limitation. Patient is motivated to learn. Patient understands and agrees with treatment goals and plan. PT explains patient will be examined in standing, sitting, and lying down to see how their muscles and joints work. When they are ready, they will be asked to remove their underwear so PT can examine their perineum. The patient is also given the option of providing their own chaperone as one is not provided in our  facility. The patient also has the right and is explained the right to defer or refuse any part of the evaluation or treatment including the internal exam. With the patient's consent, PT will use one gloved finger to gently assess the muscles of the pelvic floor, seeing how well it contracts and relaxes and if there is muscle symmetry. After, the patient will get dressed and PT and patient will discuss exam findings and plan of care. PT and patient discuss plan of care, schedule, attendance policy and HEP activities.  Going through the rectum working on the puborectalis, perineal body, anococcygeal ligament, iliococcygeus and obturator internist.  Neuromuscular re-education: Pelvic floor contraction training: Therapist finger in the rectum working on rectal contraction with a lift and pulling the puborectalis forwar Exercises: Stretches/mobility: Sitting piriformis stretch holding 30 sec bil.  Sitting trunk rotation holding 30 sec. Bil.     05/11/23 Manual: Soft tissue mobilization: Soft tissue work to lumbar paraspinals, quadratus, and posterior intercostalis  Spinal mobilization: Pa and rotational mobilization to T8-L5 grade 3 PA mobilization to SI joint Neuromuscular re-education: Down training: Cat cow with tactile cues to move the spine in correct way Marjo Bicker pose with diaphragmatic breathing to relax the pelvic floor Quadruped hip rock to work on hip flexion and feet internally rotated to stretch the pelvic floor Exercises: Stretches/mobility: Sitting piriformis stretch holding 30 sec bil.  Thread the needle to increase spinal rotation and rib mobility Therapeutic activities: Functional strengthening activities: Squatting with hip flexion without flexion at the waist Dead lifts  to work on hip flexion for squatting Education on breathing out with squats to reduce strain on the pelvic floor    04/18/23 Manual: Myofascial release: Fascial release of the urogenital diaphragm going  through the restrictions Internal pelvic floor techniques: No emotional/communication barriers or cognitive limitation. Patient is motivated to learn. Patient understands and agrees with treatment goals and plan. PT explains patient will be examined in standing, sitting, and lying down to see how their muscles and joints work. When they are ready, they will be asked to remove their underwear so PT can examine their perineum. The patient is also given the option of providing their own chaperone as one is not provided in our facility. The patient also has the right and is explained the right to defer or refuse any part of the evaluation or treatment including the internal exam. With the patient's consent, PT will use one gloved finger to gently assess the muscles of the pelvic floor, seeing how well it contracts and relaxes and if there is muscle symmetry. After, the patient will get dressed and PT and patient will discuss exam findings and plan of care. PT and patient discuss plan of care, schedule, attendance policy and HEP activities.  Going through the vaginal canal working on the levator ani, sides of the introitus, posterior vaginal canal, and the perineal body Neuromuscular re-education: Pelvic floor contraction training: Therapist finger in the vaginal canal working on contraction giving tactile cues to the muscles and patient on what a contraction is and holding for 6 seconds Down training: Diaphragmatic breathing with focus to place air into the abdomen and pelvic floor in supine      PATIENT EDUCATION:  05/11/23 Education details: Therapist educated patient on bladder irritants, how to fill out bladder diary, and urge to voidAccess Code: [email or web address] Person educated: Patient Education method: Explanation, Demonstration, Tactile cues, Verbal cues, and Handouts Education comprehension: verbalized understanding, returned demonstration, verbal cues required, tactile cues required, and needs further  education  HOME EXERCISE PROGRAM: 05/11/23 Access Code: [email or web address] URL: [email or web address] Date: 05/11/2023 Prepared by: Eulis Foster  Exercises - Supine Diaphragmatic Breathing  - 2 x daily - 7 x weekly - 1 sets - 10 reps - Diaphragmatic Breathing in Child's Pose with Pelvic Floor Relaxation  - 1 x daily - 7 x weekly - 1 sets - 10 reps - Seated Piriformis Stretch with Trunk Bend  - 1 x daily - 7 x weekly - 1 sets - 2 reps - 30 sec hold - Diaphragmatic Breathing in Child's Pose with Pelvic Floor Relaxation  - 1 x daily - 7 x weekly - 1 sets - 1 reps - 1 min hold - Cat Cow  - 1 x daily - 7 x weekly - 1 sets - 10 reps - Child's Pose with Thread the Needle  - 1 x daily - 7 x weekly - 1 sets - 4 reps - Squat  - 1 x daily - 7 x weekly - 1 sets - 10 reps - Half Deadlift with Kettlebell  - 1 x daily - 7 x weekly - 1 sets - 10 reps ASSESSMENT:  CLINICAL IMPRESSION: Patient is a 68 y.o. female who was seen today for physical therapy  treatment for overactive bladder, levator spasm, and fecal incontinence. Patient had s/p Robotic assisted sacrocolpopexy, right salpingo- oophorectomy, cystoscopy on 11/08/22. Stool leakage is 50% better.  She was able to pull the puborectalis forward after the manual work and feel what type of contraction she was to  be doing. Pelvic floor strength is 3/5 with a weak hug of the therapist finger.  Patient will benefit from skilled therapy to improve pelvic floor coordination and strength to reduce leakage.   OBJECTIVE IMPAIRMENTS: decreased coordination, decreased ROM, decreased strength, increased fascial restrictions, and increased muscle spasms.   ACTIVITY LIMITATIONS: continence, toileting, and locomotion level  PARTICIPATION LIMITATIONS: driving, shopping, and community activity  PERSONAL FACTORS: Fitness and 1-2 comorbidities: s/p Robotic assisted sacrocolpopexy, right salpingo- oophorectomy, cystoscopy on 11/08/22 ; Appendectomy 1986; vaginal  prolapse repair with hysterectomy 1986;  Left knee replacement  are also affecting patient's functional outcome.   REHAB POTENTIAL: Excellent  CLINICAL DECISION MAKING: Evolving/moderate complexity  EVALUATION COMPLEXITY: Moderate   GOALS: Goals reviewed with patient? Yes  SHORT TERM GOALS: Target date: 06/02/23  Patient educated and understands what bladder irritants are and how they affect the bladder.  Baseline: Goal status: Met 04/11/23  2.  Patient educated on urge to void to reduce the urgency.  Baseline:  Goal status: Met 04/11/23  3.  Patient is able to perform diaphragmatic breathing to relax the pelvic floor.  Baseline:  Goal status: Met 04/18/23   LONG TERM GOALS: Target date: 06/27/23  Patient independent with advanced HEP for core, pelvic floor and hip strength.  Baseline:  Goal status: INITIAL  2.  Patient is able to have the urge to void and walk to the bathroom without leaking urine due to increased in strength.  Baseline:  Goal status: INITIAL  3.  Patient is able to go out in public and not worry where the bathroom are due to her urgency decreased >/= 50%.  Baseline:  Goal status: INITIAL  4.  Patient reports her fecal leakage has improved >/= 75% due to increased in pelvic floor strength >/= 3/5.  Baseline:  Goal status: INITIAL   PLAN:  PT FREQUENCY: 1-2x/week  PT DURATION: 12 weeks  PLANNED INTERVENTIONS: 97110-Therapeutic exercises, 97530- Therapeutic activity, 97112- Neuromuscular re-education, 97535- Self Care, 16109- Manual therapy, [id number]- Electrical stimulation (unattended), [phone]- Electrical stimulation (manual), 97035- Ultrasound, Patient/Family education, Dry Needling, Joint mobilization, Spinal mobilization, Cryotherapy, Moist heat, and Biofeedback  PLAN FOR NEXT SESSION:   hip flexor stretch, abdominal contraction with pelvic floor,   Eulis Foster, PT 05/25/23 2:56 PM

## 2023-05-30 ENCOUNTER — Ambulatory Visit

## 2023-05-30 ENCOUNTER — Encounter: Payer: Self-pay | Admitting: Internal Medicine

## 2023-05-30 VITALS — BP 128/76 | HR 75 | Temp 98.5°F | Ht 59.0 in | Wt 204.0 lb

## 2023-05-30 DIAGNOSIS — R030 Elevated blood-pressure reading, without diagnosis of hypertension: Secondary | ICD-10-CM

## 2023-05-30 NOTE — Progress Notes (Signed)
 Patient presents today for a BP check, patient not currently taking any BP medication.  BP Readings from Last 3 Encounters:  05/30/23 128/76  05/16/23 (!) 130/90  05/08/23 111/71   Per provider- check bp 2/3 times a week. F/u next visit.

## 2023-06-01 ENCOUNTER — Encounter: Payer: Self-pay | Admitting: Physical Therapy

## 2023-06-01 DIAGNOSIS — M6281 Muscle weakness (generalized): Secondary | ICD-10-CM

## 2023-06-01 DIAGNOSIS — R278 Other lack of coordination: Secondary | ICD-10-CM

## 2023-06-01 NOTE — Therapy (Signed)
 OUTPATIENT PHYSICAL THERAPY FEMALE PELVIC TREATMENT   Patient Name: Olivia Paul MRN: [id number] DOB:Jun 19, 1955, 68 y.o., female Today's Date: 06/01/2023  END OF SESSION:  PT End of Session - 06/01/23 1403     Visit Number 6    Date for PT Re-Evaluation 06/27/23    Authorization Type [employer] medicare    Authorization Time Period 04/04/2023 - 06/27/2023    Authorization - Visit Number 6    Authorization - Number of Visits 12    Progress Note Due on Visit 10    PT Start Time 1400    PT Stop Time 1445    PT Time Calculation (min) 45 min    Activity Tolerance Patient tolerated treatment well    Behavior During Therapy [facility] for tasks assessed/performed             Past Medical History:  Diagnosis Date   Anxiety    Arthritis    lt knee   Asthma 2011   Complication of anesthesia    Fatty liver    GERD (gastroesophageal reflux disease)    Headache    sinus   History of COVID-19 10/11/2022   PONV (postoperative nausea and vomiting)    Pre-diabetes    Sleep apnea    cpap set on 4   Vitamin D deficiency    Wears glasses    Past Surgical History:  Procedure Laterality Date   APPENDECTOMY  1986   BREAST BIOPSY     BREAST EXCISIONAL BIOPSY     CYSTOSCOPY N/A 11/08/2022   Procedure: CYSTOSCOPY;  Surgeon: Marguerita Beards, MD;  Location: [facility];  Service: Gynecology;  Laterality: N/A;   KNEE ARTHROSCOPY WITH MENISCAL REPAIR Left 02/21/2018   LIPOMA EXCISION     ROBOTIC ASSISTED LAPAROSCOPIC SACROCOLPOPEXY N/A 11/08/2022   Procedure: XI ROBOTIC ASSISTED LAPAROSCOPIC SACROCOLPOPEXY;  Surgeon: Marguerita Beards, MD;  Location[facility];  Service: Gynecology;  Laterality: N/A;   TOTAL KNEE ARTHROPLASTY Left 11/16/2018   Procedure: TOTAL KNEE ARTHROPLASTY;  Surgeon: Jodi Geralds, MD;  Location: [facility];  Service: Orthopedics;  Laterality: Left;   total knee arthroplasty-revision Left 08/31/2020   Dr, Sherilyn Banker   TRIGGER FINGER  RELEASE Right 12/25/2020   VAGINAL HYSTERECTOMY  1986   VAGINAL PROLAPSE REPAIR     1986   XI ROBOTIC ASSISTED OOPHORECTOMY Right 11/08/2022   Procedure: XI ROBOTIC ASSISTED OOPHORECTOMY;  Surgeon: Marguerita Beards, MD;  Location: [facility];  Service: Gynecology;  Laterality: Right;   Patient Active Problem List   Diagnosis Date Noted   Encounter for general adult medical examination w/o abnormal findings 05/16/2023   Elevated blood pressure reading 05/16/2023   Vaginal vault prolapse after hysterectomy 11/08/2022   COVID-19 10/18/2022   Cough in adult 10/18/2022   Pure hypercholesterolemia 05/22/2022   Prediabetes 05/22/2022   OSA on CPAP 05/22/2022   Mood disorder (HCC) 12/27/2021   Urinary retention 09/02/2021   Menopause 09/02/2021   OAB (overactive bladder) 09/02/2021   Daytime sleepiness 07/02/2021   Insomnia 07/02/2021   Episodic tension-type headache, not intractable 03/31/2021   Lumbar radiculopathy 01/29/2021   Acquired trigger finger of right middle finger 11/26/2020   Impingement syndrome of right shoulder region 10/22/2020   Cervical radiculopathy 10/16/2020   Dizziness 08/02/2020   Class 3 severe obesity due to excess calories with serious comorbidity and body mass index (BMI) of 40.0 to 44.9 in adult [facility]) 08/02/2020   Family history of heart disease 08/02/2020  Primary osteoarthritis of left knee 11/16/2018   Unilateral primary osteoarthritis, left knee 10/29/2018   Xanthelasma of right eyelid 03/26/2018   Anxiety 03/26/2018   Perennial allergic rhinitis 02/02/2018   Vitamin D deficiency 07/27/2017   Moderate persistent asthma, uncomplicated 05/11/2017   Drug allergy 05/11/2017   Basal cell papilloma 03/20/2015   Changing skin lesion 01/13/2015   Asthma 11/05/2014   Allergic rhinoconjunctivitis 11/05/2014    PCP: Cleave Curling, MD  REFERRING PROVIDER: Arma Lamp, MD   REFERRING DIAG:  N32.81 (ICD-10-CM) - OAB  (overactive bladder)  [phone] (ICD-10-CM) - Levator spasm  R15.9 (ICD-10-CM) - Incontinence of feces, unspecified fecal incontinence type    THERAPY DIAG:  Muscle weakness (generalized)  Other lack of coordination  Rationale for Evaluation and Treatment: Rehabilitation  ONSET DATE: 1983  SUBJECTIVE:                                                                                                                                                                                           SUBJECTIVE STATEMENT: I have not had stool leakage since last visit. I am drinking more water. I feel damp during the night. Patient can now go in the store to shop without having to go to the bathroom. I am doing the metamucil. Patient is able to get her pants down without leaking.   PAIN:  Are you having pain? No   PRECAUTIONS: None  RED FLAGS: None   WEIGHT BEARING RESTRICTIONS: No  FALLS:  Has patient fallen in last 6 months? Yes. Number of falls 1 time and slipped on ice not due to balance.  OCCUPATION: retired  ACTIVITY LEVEL : water aerobics  PLOF: Independent  PATIENT GOALS: reduce urinary leakage, not know where every bathroom is in the store  PERTINENT HISTORY:  s/p Robotic assisted sacrocolpopexy, right salpingo- oophorectomy, cystoscopy on 11/08/22 ; Appendectomy 1986; vaginal prolapse repair with hysterectomy 1986;  Left knee replacement Sexual abuse: No  BOWEL MOVEMENT: Pain with bowel movement: No Type of bowel movement:Type (Bristol Stool Scale) Type 4 and Frequency 1-2 days Fully empty rectum: Yes:   Leakage: Yes: does not always realize she had stool leakage but has not had it in the last several weeks Fiber supplement/laxative Yes  and Metamucil  URINATION: Pain with urination: No Fully empty bladder: Yes:   Stream: Strong Urgency: Yes leaks when pulling her pants down Frequency: average Leakage: Urge to void, Walking to the bathroom, and during the night Pads:  Yes: wears a pad when goes out for activity  INTERCOURSE: no activity  Pain with intercourse: none  PREGNANCY: Vaginal deliveries 2 Tearing No Episiotomy  Yes   PROLAPSE: None   OBJECTIVE:  Note: Objective measures were completed at Evaluation unless otherwise noted.  DIAGNOSTIC FINDINGS:  none  PATIENT SURVEYS:  PFIQ-7: 48; UIQ-7 43; CRAIQ-7 43  COGNITION: Overall cognitive status: Within functional limits for tasks assessed     SENSATION: Light touch: Appears intact   POSTURE: rounded shoulders, forward head, and decreased lumbar lordosis   LUMBARAROM/PROM: Lumbar ROM decreased by 25%   LOWER EXTREMITY ROM:  Passive ROM Right eval Left eval  Hip external rotation 80 50   (Blank rows = not tested)  LOWER EXTREMITY MMT:  MMT Right eval Left eval  Hip flexion 4/5 4/5  Hip extension 3/5 3/5  Hip abduction 3-/5 3/5   (Blank rows = not tested) PALPATION:    Abdominal: Decreased movement of the lower rib cage, tightness in the diaphragm                External Perineal Exam: intact but dry                             Internal Pelvic Floor: tightness throughout the pelvic floor  Patient confirms identification and approves PT to assess internal pelvic floor and treatment Yes  PELVIC MMT:   MMT eval 04/18/23 05/25/23  Vaginal 1/5 2/5   Internal Anal Sphincter 1/5  3/5 weak hug  External Anal Sphincter 1/5  3/5 weak hug  Puborectalis 1/5  3/5 weak hug  Diastasis Recti 1/5  N/a  (Blank rows = not tested)        TONE: increased  PROLAPSE: none  TODAY'S TREATMENT:   06/01/23 Neuromuscular re-education: Core facilitation:( all exercises with pelvic floor and core engagement) Supine hip flexion isometrics  10 x each side Supine bridge holding 3 # wt in air to engage the abdominals Supine holding 1# in hands and alternate flexion with core engaged. Then used red band around legs Supine holding 1 # punching on diagonal to work the obliques then used  the red band Supine marching with red band around her knees Supine clamshell with red band Therapeutic activities: Functional strengthening activities: Educated patient on Lifting with arms and legs and breathing out to reduce pressure on the pelvic floor and back Educated patient in pushing objects using arms and legs and not holding breath to reduce pressure on the pelvic floor and back    05/25/23 Manual: Soft tissue mobilization: Manual work to the left hip, gluteal, left lateral abdominal, and SI joint to elongate the muscles Internal pelvic floor techniques: No emotional/communication barriers or cognitive limitation. Patient is motivated to learn. Patient understands and agrees with treatment goals and plan. PT explains patient will be examined in standing, sitting, and lying down to see how their muscles and joints work. When they are ready, they will be asked to remove their underwear so PT can examine their perineum. The patient is also given the option of providing their own chaperone as one is not provided in our facility. The patient also has the right and is explained the right to defer or refuse any part of the evaluation or treatment including the internal exam. With the patient's consent, PT will use one gloved finger to gently assess the muscles of the pelvic floor, seeing how well it contracts and relaxes and if there is muscle symmetry. After, the patient will get dressed and PT and patient will discuss exam findings and plan of care. PT and patient  discuss plan of care, schedule, attendance policy and HEP activities.  Going through the rectum working on the puborectalis, perineal body, anococcygeal ligament, iliococcygeus and obturator internist.  Neuromuscular re-education: Pelvic floor contraction training: Therapist finger in the rectum working on rectal contraction with a lift and pulling the puborectalis forwar Exercises: Stretches/mobility: Sitting piriformis stretch  holding 30 sec bil.  Sitting trunk rotation holding 30 sec. Bil.     05/11/23 Manual: Soft tissue mobilization: Soft tissue work to lumbar paraspinals, quadratus, and posterior intercostalis  Spinal mobilization: Pa and rotational mobilization to T8-L5 grade 3 PA mobilization to SI joint Neuromuscular re-education: Down training: Cat cow with tactile cues to move the spine in correct way Marjo Bicker pose with diaphragmatic breathing to relax the pelvic floor Quadruped hip rock to work on hip flexion and feet internally rotated to stretch the pelvic floor Exercises: Stretches/mobility: Sitting piriformis stretch holding 30 sec bil.  Thread the needle to increase spinal rotation and rib mobility Therapeutic activities: Functional strengthening activities: Squatting with hip flexion without flexion at the waist Dead lifts to work on hip flexion for squatting Education on breathing out with squats to reduce strain on the pelvic floor       PATIENT EDUCATION:  06/01/23 Education details: Therapist educated patient on bladder irritants, how to fill out bladder diary, and urge to voidAccess Code: [email or web address] Person educated: Patient Education method: Explanation, Demonstration, Tactile cues, Verbal cues, and Handouts Education comprehension: verbalized understanding, returned demonstration, verbal cues required, tactile cues required, and needs further education  HOME EXERCISE PROGRAM: 06/01/23 Access Code: [email or web address] URL: [email or web address] Date: 06/01/2023 Prepared by: Eulis Foster  Exercises - Supine Diaphragmatic Breathing  - 2 x daily - 7 x weekly - 1 sets - 10 reps - Diaphragmatic Breathing in Child's Pose with Pelvic Floor Relaxation  - 1 x daily - 7 x weekly - 1 sets - 10 reps - Seated Piriformis Stretch with Trunk Bend  - 1 x daily - 7 x weekly - 1 sets - 2 reps - 30 sec hold - Diaphragmatic Breathing in Child's Pose with Pelvic Floor Relaxation  - 1 x daily - 7 x  weekly - 1 sets - 1 reps - 1 min hold - Cat Cow  - 1 x daily - 7 x weekly - 1 sets - 10 reps - Child's Pose with Thread the Needle  - 1 x daily - 7 x weekly - 1 sets - 4 reps - Squat  - 1 x daily - 3 x weekly - 1 sets - 10 reps - Half Deadlift with Kettlebell  - 1 x daily - 3 x weekly - 1 sets - 10 reps - Hooklying Isometric Hip Flexion  - 1 x daily - 3 x weekly - 1 sets - 10 reps - Supine Bridge  - 1 x daily - 3 x weekly - 1 sets - 15 reps - Supine Alternating Shoulder Flexion  - 1 x daily - 3 x weekly - 2 sets - 10 reps - Supine Alternating Arm Punches on Foam Roll  - 1 x daily - 3 x weekly - 2 sets - 10 reps - Hooklying Clamshell with Resistance  - 1 x daily - 3 x weekly - 2 sets - 10 reps - Supine March with Resistance Band  - 1 x daily - 3 x weekly - 2 sets - 10 reps  ASSESSMENT:  CLINICAL IMPRESSION: Patient is a 68 y.o. female who was seen today for physical therapy  treatment for  overactive bladder, levator spasm, and fecal incontinence. Patient had s/p Robotic assisted sacrocolpopexy, right salpingo- oophorectomy, cystoscopy on 11/08/22. Patient does not have to go into the bathroom every store she goes in. She has not had stool leakage since last visit. Patient is not leaking when she pushing her pants down.   She is learning more core exercises. Patient will benefit from skilled therapy to improve pelvic floor coordination and strength to reduce leakage.   OBJECTIVE IMPAIRMENTS: decreased coordination, decreased ROM, decreased strength, increased fascial restrictions, and increased muscle spasms.   ACTIVITY LIMITATIONS: continence, toileting, and locomotion level  PARTICIPATION LIMITATIONS: driving, shopping, and community activity  PERSONAL FACTORS: Fitness and 1-2 comorbidities: s/p Robotic assisted sacrocolpopexy, right salpingo- oophorectomy, cystoscopy on 11/08/22 ; Appendectomy 1986; vaginal prolapse repair with hysterectomy 1986;  Left knee replacement  are also affecting  patient's functional outcome.   REHAB POTENTIAL: Excellent  CLINICAL DECISION MAKING: Evolving/moderate complexity  EVALUATION COMPLEXITY: Moderate   GOALS: Goals reviewed with patient? Yes  SHORT TERM GOALS: Target date: 06/02/23  Patient educated and understands what bladder irritants are and how they affect the bladder.  Baseline: Goal status: Met 04/11/23  2.  Patient educated on urge to void to reduce the urgency.  Baseline:  Goal status: Met 04/11/23  3.  Patient is able to perform diaphragmatic breathing to relax the pelvic floor.  Baseline:  Goal status: Met 04/18/23   LONG TERM GOALS: Target date: 06/27/23  Patient independent with advanced HEP for core, pelvic floor and hip strength.  Baseline:  Goal status: INITIAL  2.  Patient is able to have the urge to void and walk to the bathroom without leaking urine due to increased in strength.  Baseline:  Goal status: INITIAL  3.  Patient is able to go out in public and not worry where the bathroom are due to her urgency decreased >/= 50%.  Baseline:  Goal status: INITIAL  4.  Patient reports her fecal leakage has improved >/= 75% due to increased in pelvic floor strength >/= 3/5.  Baseline:  Goal status: INITIAL   PLAN:  PT FREQUENCY: 1-2x/week  PT DURATION: 12 weeks  PLANNED INTERVENTIONS: 97110-Therapeutic exercises, 97530- Therapeutic activity, [id number]- Neuromuscular re-education, 97535- Self Care, 16109- Manual therapy, [id number]- Electrical stimulation (unattended), [phone]- Electrical stimulation (manual), 97035- Ultrasound, Patient/Family education, Dry Needling, Joint mobilization, Spinal mobilization, Cryotherapy, Moist heat, and Biofeedback  PLAN FOR NEXT SESSION:   hip flexor stretch, focus on the rectal strength  Marsha Skeen, PT 06/01/23 2:51 PM

## 2023-06-07 ENCOUNTER — Other Ambulatory Visit: Payer: Self-pay | Admitting: Internal Medicine

## 2023-06-07 ENCOUNTER — Ambulatory Visit: Payer: Medicare Other

## 2023-06-07 DIAGNOSIS — Z0000 Encounter for general adult medical examination without abnormal findings: Secondary | ICD-10-CM

## 2023-06-07 MED ORDER — TRAMADOL HCL 50 MG PO TABS: 50.0000 mg | ORAL_TABLET | Freq: Three times a day (TID) | ORAL | 0 refills | Status: DC | PRN

## 2023-06-07 NOTE — Patient Instructions (Signed)
 Olivia Paul , Thank you for taking time to come for your Medicare Wellness Visit. I appreciate your ongoing commitment to your health goals. Please review the following plan we discussed and let me know if I can assist you in the future.   Referrals/Orders/Follow-Ups/Clinician Recommendations: none  This is a list of the screening recommended for you and due dates:  Health Maintenance  Topic Date Due   COVID-19 Vaccine (6 - 2024-25 season) 12/29/2022   Flu Shot  09/15/2023   Medicare Annual Wellness Visit  06/06/2024   Mammogram  03/02/2025   DTaP/Tdap/Td vaccine (2 - Td or Tdap) 11/11/2028   Colon Cancer Screening  05/26/2032   Pneumonia Vaccine  Completed   DEXA scan (bone density measurement)  Completed   Hepatitis C Screening  Completed   Zoster (Shingles) Vaccine  Completed   HPV Vaccine  Aged Out   Meningitis B Vaccine  Aged Out    Advanced directives: (Copy Requested) Please bring a copy of your health care power of attorney and living will to the office to be added to your chart at your convenience. You can mail to [facility] [address] [place], [place] [address] or email to [email or web address]  Next Medicare Annual Wellness Visit scheduled for next year: Yes  insert Preventive Care attachment Insert FALL PREVENTION attachment if needed

## 2023-06-07 NOTE — Progress Notes (Signed)
 Subjective:   Olivia Paul is a 68 y.o. who presents for a Medicare Wellness preventive visit.  Visit Complete: Virtual I connected with  Olivia Paul on 06/07/23 by a audio enabled telemedicine application and verified that I am speaking with the correct person using two identifiers.  Patient Location: Home  Provider Location: Office/Clinic  I discussed the limitations of evaluation and management by telemedicine. The patient expressed understanding and agreed to proceed.  Vital Signs: Because this visit was a virtual/telehealth visit, some criteria may be missing or patient reported. Any vitals not documented were not able to be obtained and vitals that have been documented are patient reported.  VideoError- Librarian, academic were attempted between this provider and patient, however failed, due to patient having technical difficulties OR patient did not have access to video capability.  We continued and completed visit with audio only.   Persons Participating in Visit: Patient.  AWV Questionnaire: Yes: Patient Medicare AWV questionnaire was completed by the patient on 06/06/2023; I have confirmed that all information answered by patient is correct and no changes since this date.  Cardiac Risk Factors include: none     Objective:    Today's Vitals   06/07/23 0900  PainSc: 8    There is no height or weight on file to calculate BMI.     06/07/2023    9:11 AM 04/04/2023   10:31 AM 11/08/2022    6:39 AM 05/10/2022    9:31 AM 05/18/2020    2:13 PM 11/16/2018    4:26 PM 11/16/2018   10:24 AM  Advanced Directives  Does Patient Have a Medical Advance Directive? Yes Yes Yes Yes No Yes Yes  Type of Estate agent of [place];Living will Healthcare Power of [place];Living will Healthcare Power of [place];Living will Living will  Healthcare Power of Attorney Living will;Healthcare Power of Attorney  Does patient want to make changes  to medical advance directive?  No - Patient declined   No - Patient declined No - Patient declined No - Patient declined  Copy of Healthcare Power of Attorney in Chart? No - copy requested No - copy requested No - copy requested   No - copy requested No - copy requested  Would patient like information on creating a medical advance directive?    No - Patient declined No - Patient declined      Current Medications (verified) Outpatient Encounter Medications as of 06/07/2023  Medication Sig   acetaminophen  (TYLENOL ) 500 MG tablet Take 1 tablet (500 mg total) by mouth every 6 (six) hours as needed (pain).   albuterol  (PROVENTIL ) (2.5 MG/3ML) 0.083% nebulizer solution Use 1 vial every 4-6 hours as needed for cough, wheeze, shortness of breath or chest tightness   albuterol  (VENTOLIN  HFA) 108 (90 Base) MCG/ACT inhaler Inhale 2 puffs into the lungs every 4 (four) hours as needed for wheezing or shortness of breath.   Ascorbic Acid (VITAMIN C) 100 MG tablet Take 100 mg by mouth daily.   atorvastatin  (LIPITOR) 20 MG tablet TAKE 1 TABLET BY MOUTH EVERY DAY (Patient taking differently: Take 20 mg by mouth every evening.)   BREO ELLIPTA  200-25 MCG/ACT AEPB Inhale 1 puff into the lungs daily.   cetirizine  (ZYRTEC ) 10 MG tablet Take 1 tablet (10 mg total) by mouth daily. (Patient taking differently: Take 10 mg by mouth as needed.)   Cholecalciferol (VITAMIN D ) 50 MCG (2000 UT) CAPS Take 5,000 Units by mouth daily.  clobetasol  cream (TEMOVATE ) 0.05 % Apply topically 2 (TWO) times a day as needed to areas of ras   DULoxetine  (CYMBALTA ) 60 MG capsule TAKE 1 CAPSULE BY MOUTH EVERY DAY   EPINEPHrine  0.3 mg/0.3 mL IJ SOAJ injection Inject 0.3 mLs (0.3 mg total) into the muscle as needed for anaphylaxis.   fexofenadine  (ALLEGRA ) 180 MG tablet TAKE ONE TABLET ONCE DAILY FOR RUNNY NOSE OR ITCHING (Patient taking differently: daily. TAKE ONE TABLET ONCE DAILY FOR RUNNY NOSE OR ITCHING)   gabapentin  (NEURONTIN ) 300 MG  capsule as needed.   hydrOXYzine  (ATARAX ) 25 MG tablet TAKE 1 TABLET BY MOUTH AT BEDTIME AS NEEDED FOR ITCHING.   ipratropium (ATROVENT ) 0.06 % nasal spray 1-2 sprays each nostril up to 3-4 times a day as needed for nasal drainage or congestion   ipratropium-albuterol  (DUONEB) 0.5-2.5 (3) MG/3ML SOLN Inhale 3 mLs into the lungs daily as needed.   Krill Oil (OMEGA-3) 500 MG CAPS Take 500 mg by mouth daily.   magnesium  oxide (MAG-OX) 400 MG tablet Take 400 mg by mouth daily.   meclizine  (ANTIVERT ) 50 MG tablet Take 1/2 tab po qpm prn   Menthol-Methyl Salicylate (TIGER BALM LINIMENT EX) Apply 1 application topically at bedtime as needed (pain).   montelukast  (SINGULAIR ) 10 MG tablet Take 1 tablet (10 mg total) by mouth at bedtime.   pantoprazole  (PROTONIX ) 40 MG tablet TAKE 1 TABLET BY MOUTH EVERY DAY (Patient taking differently: at bedtime.)   tiZANidine  (ZANAFLEX ) 4 MG tablet Take 4 mg by mouth every 8 (eight) hours as needed.   traMADol  (ULTRAM ) 50 MG tablet Take 50 mg by mouth every 8 (eight) hours as needed for moderate pain.    triamcinolone  cream (KENALOG ) 0.1 % APPLY TO AFFECTED AREA TWICE DAILY AS NEEDED   TURMERIC PO Take by mouth as needed.   Vibegron  (GEMTESA ) 75 MG TABS Take 1 tablet (75 mg total) by mouth daily.   vitamin E 400 UNIT capsule Take 400 Units by mouth daily.   zonisamide (ZONEGRAN) 50 MG capsule Take 100 mg by mouth at bedtime.   nitrofurantoin  (MACRODANTIN ) 50 MG capsule TAKE 1 CAPSULE BY MOUTH EVERYDAY AT BEDTIME (Patient not taking: Reported on 06/07/2023)   vitamin k 100 MCG tablet Take 100 mcg by mouth daily. (Patient not taking: Reported on 06/07/2023)   No facility-administered encounter medications on file as of 06/07/2023.    Allergies (verified) Meloxicam, Nsaids, Sulfa antibiotics, Cat dander, Pollen extract, and Erythromycin   History: Past Medical History:  Diagnosis Date   Allergy     Anxiety    Arthritis    lt knee   Asthma 2011   Complication of  anesthesia    Fatty liver    GERD (gastroesophageal reflux disease)    Headache    sinus   History of COVID-19 10/11/2022   PONV (postoperative nausea and vomiting)    Pre-diabetes    Sleep apnea    cpap set on 4   Vitamin D  deficiency    Wears glasses    Past Surgical History:  Procedure Laterality Date   APPENDECTOMY  1986   BREAST BIOPSY     BREAST EXCISIONAL BIOPSY     CYSTOSCOPY N/A 11/08/2022   Procedure: CYSTOSCOPY;  Surgeon: Arma Lamp, MD;  Location: [facility];  Service: Gynecology;  Laterality: N/A;   KNEE ARTHROSCOPY WITH MENISCAL REPAIR Left 02/21/2018   LIPOMA EXCISION     ROBOTIC ASSISTED LAPAROSCOPIC SACROCOLPOPEXY N/A 11/08/2022   Procedure: XI ROBOTIC ASSISTED  LAPAROSCOPIC SACROCOLPOPEXY;  Surgeon: Arma Lamp, MD;  Location: [facility];  Service: Gynecology;  Laterality: N/A;   TOTAL KNEE ARTHROPLASTY Left 11/16/2018   Procedure: TOTAL KNEE ARTHROPLASTY;  Surgeon: Neil Balls, MD;  Location: [facility];  Service: Orthopedics;  Laterality: Left;   total knee arthroplasty-revision Left 08/31/2020   Dr, Aquilla Knapp   TRIGGER FINGER RELEASE Right 12/25/2020   VAGINAL HYSTERECTOMY  1986   VAGINAL PROLAPSE REPAIR     1986   XI ROBOTIC ASSISTED OOPHORECTOMY Right 11/08/2022   Procedure: XI ROBOTIC ASSISTED OOPHORECTOMY;  Surgeon: Arma Lamp, MD;  Location: [facility];  Service: Gynecology;  Laterality: Right;   Family History  Problem Relation Age of Onset   Kidney disease Mother    Hypertension Mother    Allergic rhinitis Mother    Heart disease Father    Allergic rhinitis Father    Asthma Father    Urticaria Sister    Breast cancer Paternal Grandmother    Cancer Paternal Grandmother    COPD Paternal Grandmother    Breast cancer Maternal Aunt    Cancer Maternal Aunt    COPD Maternal Aunt    Hypertension Maternal Aunt    Breast cancer Paternal Aunt    Anxiety disorder  Daughter    Depression Daughter    Asthma Son    Asthma Sister    Cancer Sister    Hypertension Sister    Cancer Maternal Aunt    Cancer Maternal Aunt    Cancer Maternal Uncle    Hypertension Maternal Uncle    Cancer Maternal Uncle    Cancer Maternal Uncle    Hypertension Maternal Uncle    Cancer Maternal Uncle    COPD Maternal Uncle    Depression Maternal Uncle    COPD Paternal Uncle    Hypertension Sister    Hypertension Brother    Angioedema Neg Hx    Atopy Neg Hx    Eczema Neg Hx    Immunodeficiency Neg Hx    Social History   Socioeconomic History   Marital status: Single    Spouse name: Not on file   Number of children: Not on file   Years of education: Not on file   Highest education level: Bachelor's degree (e.g., BA, AB, BS)  Occupational History   Not on file  Tobacco Use   Smoking status: Never    Passive exposure: Past   Smokeless tobacco: Never  Vaping Use   Vaping status: Never Used  Substance and Sexual Activity   Alcohol use: No   Drug use: No   Sexual activity: Not on file  Other Topics Concern   Not on file  Social History Narrative   Not on file   Social Drivers of Health   Financial Resource Strain: Low Risk  (06/06/2023)   Overall Financial Resource Strain (CARDIA)    Difficulty of Paying Living Expenses: Not hard at all  Food Insecurity: No Food Insecurity (06/06/2023)   Hunger Vital Sign    Worried About Running Out of Food in the Last Year: Never true    Ran Out of Food in the Last Year: Never true  Transportation Needs: No Transportation Needs (06/06/2023)   PRAPARE - Administrator, Civil Service (Medical): No    Lack of Transportation (Non-Medical): No  Physical Activity: Sufficiently Active (06/06/2023)   Exercise Vital Sign    Days of Exercise per Week: 4 days    Minutes of Exercise  per Session: 60 min  Stress: Stress Concern Present (06/06/2023)   [employer] of Occupational Health - Occupational Stress  Questionnaire    Feeling of Stress : To some extent  Social Connections: Moderately Integrated (06/06/2023)   Social Connection and Isolation Panel [NHANES]    Frequency of Communication with Friends and Family: More than three times a week    Frequency of Social Gatherings with Friends and Family: More than three times a week    Attends Religious Services: More than 4 times per year    Active Member of [employer] or Organizations: Yes    Attends Engineer, structural: More than 4 times per year    Marital Status: Never married    Tobacco Counseling Counseling given: Not Answered    Clinical Intake:  Pre-visit preparation completed: Yes  Pain : 0-10 Pain Score: 8  Pain Type: Chronic pain Pain Location: Back Pain Orientation: Lower Pain Descriptors / Indicators: Aching Pain Onset: More than a month ago Pain Frequency: Constant     Nutritional Risks: None Diabetes: No  Lab Results  Component Value Date   HGBA1C 6.1 (H) 05/16/2023   HGBA1C 6.1 (H) 12/15/2022   HGBA1C 6.2 (H) 05/10/2022     How often do you need to have someone help you when you read instructions, pamphlets, or other written materials from your doctor or pharmacy?: 1 - Never  Interpreter Needed?: No  Information entered by :: NAllen LPN   Activities of Daily Living     06/06/2023    9:00 AM 11/08/2022    6:45 AM  In your present state of health, do you have any difficulty performing the following activities:  Hearing? 0 0  Vision? 0 0  Difficulty concentrating or making decisions? 0 0  Walking or climbing stairs? 0 0  Dressing or bathing? 0 0  Doing errands, shopping? 0   Preparing Food and eating ? N   Using the Toilet? N   In the past six months, have you accidently leaked urine? N   Do you have problems with loss of bowel control? N   Managing your Medications? N   Managing your Finances? N   Housekeeping or managing your Housekeeping? N     Patient Care Team: Cleave Curling, MD  as PCP - General (Internal Medicine) Sheryle Donning, MD as PCP - Cardiology (Cardiology)  Indicate any recent Medical Services you may have received from other than [facility] providers in the past year (date may be approximate).     Assessment:   This is a routine wellness examination for [place].  Hearing/Vision screen Hearing Screening - Comments:: Denies hearing issues Vision Screening - Comments:: [facility] eye [facility]   Goals Addressed             This Visit's Progress    Patient Stated       06/07/2023, lose weight and continue to be active, get healthy and improve eating habits       Depression Screen     06/07/2023    9:14 AM 05/16/2023    9:00 AM 12/15/2022    2:23 PM 06/29/2022    9:24 AM 05/10/2022    8:55 AM 12/27/2021    9:52 AM 04/19/2021    8:40 AM  PHQ 2/9 Scores  PHQ - 2 Score 0 0 0 1 0 0 0  PHQ- 9 Score 2 0 0 2       Fall Risk     06/06/2023  9:00 AM 05/16/2023    9:00 AM 12/15/2022    2:23 PM 06/29/2022    9:24 AM 05/10/2022    8:55 AM  Fall Risk   Falls in the past year? 1 0 0 0 0  Number falls in past yr: 0 0 0 0 0  Injury with Fall? 0 0 0 0 0  Risk for fall due to : Medication side effect No Fall Risks No Fall Risks No Fall Risks No Fall Risks  Follow up Falls prevention discussed;Falls evaluation completed Falls evaluation completed Falls evaluation completed Falls evaluation completed Falls evaluation completed    MEDICARE RISK AT HOME:  Medicare Risk at Home Any stairs in or around the home?: (Patient-Rptd) Yes If so, are there any without handrails?: (Patient-Rptd) Yes Home free of loose throw rugs in walkways, pet beds, electrical cords, etc?: (Patient-Rptd) Yes Adequate lighting in your home to reduce risk of falls?: (Patient-Rptd) Yes Life alert?: (Patient-Rptd) No Use of a cane, walker or w/c?: (Patient-Rptd) No Grab bars in the bathroom?: No Shower chair or bench in shower?: (Patient-Rptd) Yes Elevated toilet seat  or a handicapped toilet?: (Patient-Rptd) Yes  TIMED UP AND GO:  Was the test performed?  No  Cognitive Function: 6CIT completed        06/07/2023    9:17 AM 05/10/2022    9:08 AM  6CIT Screen  What Year? 0 points 0 points  What month? 0 points   What time? 0 points 0 points  Count back from 20 0 points 0 points  Months in reverse 0 points 0 points  Repeat phrase 2 points 2 points  Total Score 2 points     Immunizations Immunization History  Administered Date(s) Administered   Fluad Quad(high Dose 65+) 12/02/2022   H1N1 11/16/2020   Influenza,inj,Quad PF,6+ Mos 12/04/2019, 10/22/2020   Influenza-Unspecified 01/07/2019, 12/06/2021   PFIZER(Purple Top)SARS-COV-2 Vaccination 03/09/2019, 03/30/2019, 12/01/2019, 11/18/2020   PNEUMOCOCCAL CONJUGATE-20 08/24/2021   Pfizer Covid-19 Vaccine Bivalent Booster 39yrs & up 11/18/2020, 11/03/2022   Tdap 11/12/2018   Zoster Recombinant(Shingrix) 05/24/2016, 01/07/2019   Zoster, Live 05/12/2019    Screening Tests Health Maintenance  Topic Date Due   COVID-19 Vaccine (6 - 2024-25 season) 12/29/2022   INFLUENZA VACCINE  09/15/2023   Medicare Annual Wellness (AWV)  06/06/2024   MAMMOGRAM  03/02/2025   DTaP/Tdap/Td (2 - Td or Tdap) 11/11/2028   Colonoscopy  05/26/2032   Pneumonia Vaccine 66+ Years old  Completed   DEXA SCAN  Completed   Hepatitis C Screening  Completed   Zoster Vaccines- Shingrix  Completed   HPV VACCINES  Aged Out   Meningococcal B Vaccine  Aged Out    Health Maintenance  Health Maintenance Due  Topic Date Due   COVID-19 Vaccine (6 - 2024-25 season) 12/29/2022   Health Maintenance Items Addressed: Up to date  Additional Screening:  Vision Screening: Recommended annual ophthalmology exams for early detection of glaucoma and other disorders of the eye.  Dental Screening: Recommended annual dental exams for proper oral hygiene  Community Resource Referral / Chronic Care Management: CRR required this  visit?  No   CCM required this visit?  No     Plan:     I have personally reviewed and noted the following in the patient's chart:   Medical and social history Use of alcohol, tobacco or illicit drugs  Current medications and supplements including opioid prescriptions. Patient is not currently taking opioid prescriptions. Functional ability and status Nutritional status Physical activity  Advanced directives List of other physicians Hospitalizations, surgeries, and ER visits in previous 12 months Vitals Screenings to include cognitive, depression, and falls Referrals and appointments  In addition, I have reviewed and discussed with patient certain preventive protocols, quality metrics, and best practice recommendations. A written personalized care plan for preventive services as well as general preventive health recommendations were provided to patient.     Areatha Beecham, LPN   [id number]   After Visit Summary: (MyChart) Due to this being a telephonic visit, the after visit summary with patients personalized plan was offered to patient via MyChart   Notes: Nothing significant to report at this time.

## 2023-06-08 ENCOUNTER — Telehealth: Payer: Self-pay

## 2023-06-08 ENCOUNTER — Encounter: Payer: Self-pay | Admitting: Physical Therapy

## 2023-06-08 DIAGNOSIS — R278 Other lack of coordination: Secondary | ICD-10-CM

## 2023-06-08 DIAGNOSIS — M6281 Muscle weakness (generalized): Secondary | ICD-10-CM | POA: Diagnosis not present

## 2023-06-08 NOTE — Telephone Encounter (Signed)
 Patient notified. Patient aware. She CANNOT take Tramadol  at the same time as taking duloxetine . Medication should not be taken often because of the duloxetine .

## 2023-06-08 NOTE — Therapy (Signed)
 OUTPATIENT PHYSICAL THERAPY FEMALE PELVIC TREATMENT   Patient Name: Olivia Paul MRN: [id number] DOB:06-Mar-1955, 68 y.o., female Today's Date: 06/08/2023  END OF SESSION:  PT End of Session - 06/08/23 0836     Visit Number 7    Date for PT Re-Evaluation 06/27/23    Authorization Type [employer] medicare    Authorization Time Period 04/04/2023 - 06/27/2023    Authorization - Visit Number 7    Authorization - Number of Visits 12    Progress Note Due on Visit 10    PT Start Time 0830    PT Stop Time 0915    PT Time Calculation (min) 45 min    Activity Tolerance Patient tolerated treatment well    Behavior During Therapy [facility] for tasks assessed/performed             Past Medical History:  Diagnosis Date   Allergy     Anxiety    Arthritis    lt knee   Asthma 2011   Complication of anesthesia    Fatty liver    GERD (gastroesophageal reflux disease)    Headache    sinus   History of COVID-19 10/11/2022   PONV (postoperative nausea and vomiting)    Pre-diabetes    Sleep apnea    cpap set on 4   Vitamin D  deficiency    Wears glasses    Past Surgical History:  Procedure Laterality Date   APPENDECTOMY  1986   BREAST BIOPSY     BREAST EXCISIONAL BIOPSY     CYSTOSCOPY N/A 11/08/2022   Procedure: CYSTOSCOPY;  Surgeon: Arma Lamp, MD;  Location: [facility];  Service: Gynecology;  Laterality: N/A;   KNEE ARTHROSCOPY WITH MENISCAL REPAIR Left 02/21/2018   LIPOMA EXCISION     ROBOTIC ASSISTED LAPAROSCOPIC SACROCOLPOPEXY N/A 11/08/2022   Procedure: XI ROBOTIC ASSISTED LAPAROSCOPIC SACROCOLPOPEXY;  Surgeon: Arma Lamp, MD;  Location: [facility];  Service: Gynecology;  Laterality: N/A;   TOTAL KNEE ARTHROPLASTY Left 11/16/2018   Procedure: TOTAL KNEE ARTHROPLASTY;  Surgeon: Neil Balls, MD;  Location: [facility];  Service: Orthopedics;  Laterality: Left;   total knee arthroplasty-revision Left 08/31/2020   Dr, Aquilla Knapp    TRIGGER FINGER RELEASE Right 12/25/2020   VAGINAL HYSTERECTOMY  1986   VAGINAL PROLAPSE REPAIR     1986   XI ROBOTIC ASSISTED OOPHORECTOMY Right 11/08/2022   Procedure: XI ROBOTIC ASSISTED OOPHORECTOMY;  Surgeon: Arma Lamp, MD;  Location: [facility];  Service: Gynecology;  Laterality: Right;   Patient Active Problem List   Diagnosis Date Noted   Encounter for general adult medical examination w/o abnormal findings 05/16/2023   Elevated blood pressure reading 05/16/2023   Vaginal vault prolapse after hysterectomy 11/08/2022   COVID-19 10/18/2022   Cough in adult 10/18/2022   Pure hypercholesterolemia 05/22/2022   Prediabetes 05/22/2022   OSA on CPAP 05/22/2022   Mood disorder (HCC) 12/27/2021   Urinary retention 09/02/2021   Menopause 09/02/2021   OAB (overactive bladder) 09/02/2021   Daytime sleepiness 07/02/2021   Insomnia 07/02/2021   Episodic tension-type headache, not intractable 03/31/2021   Lumbar radiculopathy 01/29/2021   Acquired trigger finger of right middle finger 11/26/2020   Impingement syndrome of right shoulder region 10/22/2020   Cervical radiculopathy 10/16/2020   Dizziness 08/02/2020   Class 3 severe obesity due to excess calories with serious comorbidity and body mass index (BMI) of 40.0 to 44.9 in adult [facility]) 08/02/2020   Family history of  heart disease 08/02/2020   Primary osteoarthritis of left knee 11/16/2018   Unilateral primary osteoarthritis, left knee 10/29/2018   Xanthelasma of right eyelid 03/26/2018   Anxiety 03/26/2018   Perennial allergic rhinitis 02/02/2018   Vitamin D  deficiency 07/27/2017   Moderate persistent asthma, uncomplicated 05/11/2017   Drug allergy  05/11/2017   Basal cell papilloma 03/20/2015   Changing skin lesion 01/13/2015   Asthma 11/05/2014   Allergic rhinoconjunctivitis 11/05/2014    PCP: Cleave Curling, MD  REFERRING PROVIDER: Arma Lamp, MD   REFERRING DIAG:  N32.81 (ICD-10-CM)  - OAB (overactive bladder)  [phone] (ICD-10-CM) - Levator spasm  R15.9 (ICD-10-CM) - Incontinence of feces, unspecified fecal incontinence type    THERAPY DIAG:  Muscle weakness (generalized)  Other lack of coordination  Rationale for Evaluation and Treatment: Rehabilitation  ONSET DATE: 1983  SUBJECTIVE:                                                                                                                                                                                           SUBJECTIVE STATEMENT: I had an incidence of stool leakage on the way home. It is not happening as often. It happens one time a week compared to several times per week.   PAIN:  Are you having pain? No   PRECAUTIONS: None  RED FLAGS: None   WEIGHT BEARING RESTRICTIONS: No  FALLS:  Has patient fallen in last 6 months? Yes. Number of falls 1 time and slipped on ice not due to balance.  OCCUPATION: retired  ACTIVITY LEVEL : water  aerobics  PLOF: Independent  PATIENT GOALS: reduce urinary leakage, not know where every bathroom is in the store  PERTINENT HISTORY:  s/p Robotic assisted sacrocolpopexy, right salpingo- oophorectomy, cystoscopy on 11/08/22 ; Appendectomy 1986; vaginal prolapse repair with hysterectomy 1986;  Left knee replacement Sexual abuse: No  BOWEL MOVEMENT: Pain with bowel movement: No Type of bowel movement:Type (Bristol Stool Scale) Type 4 and Frequency 1-2 days Fully empty rectum: Yes:   Leakage: Yes: does not always realize she had stool leakage but has not had it in the last several weeks Fiber supplement/laxative Yes  and Metamucil  URINATION: Pain with urination: No Fully empty bladder: Yes:   Stream: Strong Urgency: Yes leaks when pulling her pants down Frequency: average Leakage: Urge to void, Walking to the bathroom, and during the night Pads: Yes: wears a pad when goes out for activity  INTERCOURSE: no activity  Pain with intercourse:  none  PREGNANCY: Vaginal deliveries 2 Tearing No Episiotomy Yes   PROLAPSE: None   OBJECTIVE:  Note: Objective measures were completed at Evaluation unless  otherwise noted.  DIAGNOSTIC FINDINGS:  none  PATIENT SURVEYS:  PFIQ-7: 79; UIQ-7 43; CRAIQ-7 43  COGNITION: Overall cognitive status: Within functional limits for tasks assessed     SENSATION: Light touch: Appears intact   POSTURE: rounded shoulders, forward head, and decreased lumbar lordosis   LUMBARAROM/PROM: Lumbar ROM decreased by 25%   LOWER EXTREMITY ROM:  Passive ROM Right eval Left eval  Hip external rotation 80 50   (Blank rows = not tested)  LOWER EXTREMITY MMT:  MMT Right eval Left eval  Hip flexion 4/5 4/5  Hip extension 3/5 3/5  Hip abduction 3-/5 3/5   (Blank rows = not tested) PALPATION:    Abdominal: Decreased movement of the lower rib cage, tightness in the diaphragm                External Perineal Exam: intact but dry                             Internal Pelvic Floor: tightness throughout the pelvic floor  Patient confirms identification and approves PT to assess internal pelvic floor and treatment Yes  PELVIC MMT:   MMT eval 04/18/23 05/25/23 06/08/23  Vaginal 1/5 2/5    Internal Anal Sphincter 1/5  3/5 weak hug 3/5 weak hug  External Anal Sphincter 1/5  3/5 weak hug 3/5 weak hug  Puborectalis 1/5  3/5 weak hug 3/5 weak hug  Diastasis Recti 1/5  N/a   (Blank rows = not tested)        TONE: increased  PROLAPSE: none  TODAY'S TREATMENT:   06/08/23 Manual: Internal pelvic floor techniques: No emotional/communication barriers or cognitive limitation. Patient is motivated to learn. Patient understands and agrees with treatment goals and plan. PT explains patient will be examined in standing, sitting, and lying down to see how their muscles and joints work. When they are ready, they will be asked to remove their underwear so PT can examine their perineum. The patient is  also given the option of providing their own chaperone as one is not provided in our facility. The patient also has the right and is explained the right to defer or refuse any part of the evaluation or treatment including the internal exam. With the patient's consent, PT will use one gloved finger to gently assess the muscles of the pelvic floor, seeing how well it contracts and relaxes and if there is muscle symmetry. After, the patient will get dressed and PT and patient will discuss exam findings and plan of care. PT and patient discuss plan of care, schedule, attendance policy and HEP activities.  Going through the rectum working on the puborectalis, distraction of the coccyx, anococcygeal ligament, obturator internist to elongate the muscles and reduce restrictions for a better rectal contraction Neuromuscular re-education: Pelvic floor contraction training: Therapist finger in the rectum working on rectal strength giving tactile cues around the outside of the rectum and internally for the lift Standing hip adduction with red band with rectal contraction and red band Standing hip adduction with extension, red band and contraction of rectum Standing hip extension with knee flexed leaning on the counter with tactile cues to contract the abdominals and rectum    06/01/23 Neuromuscular re-education: Core facilitation:( all exercises with pelvic floor and core engagement) Supine hip flexion isometrics  10 x each side Supine bridge holding 3 # wt in air to engage the abdominals Supine holding 1# in hands and alternate flexion  with core engaged. Then used red band around legs Supine holding 1 # punching on diagonal to work the obliques then used the red band Supine marching with red band around her knees Supine clamshell with red band Therapeutic activities: Functional strengthening activities: Educated patient on Lifting with arms and legs and breathing out to reduce pressure on the pelvic floor  and back Educated patient in pushing objects using arms and legs and not holding breath to reduce pressure on the pelvic floor and back    05/25/23 Manual: Soft tissue mobilization: Manual work to the left hip, gluteal, left lateral abdominal, and SI joint to elongate the muscles Internal pelvic floor techniques: No emotional/communication barriers or cognitive limitation. Patient is motivated to learn. Patient understands and agrees with treatment goals and plan. PT explains patient will be examined in standing, sitting, and lying down to see how their muscles and joints work. When they are ready, they will be asked to remove their underwear so PT can examine their perineum. The patient is also given the option of providing their own chaperone as one is not provided in our facility. The patient also has the right and is explained the right to defer or refuse any part of the evaluation or treatment including the internal exam. With the patient's consent, PT will use one gloved finger to gently assess the muscles of the pelvic floor, seeing how well it contracts and relaxes and if there is muscle symmetry. After, the patient will get dressed and PT and patient will discuss exam findings and plan of care. PT and patient discuss plan of care, schedule, attendance policy and HEP activities.  Going through the rectum working on the puborectalis, perineal body, anococcygeal ligament, iliococcygeus and obturator internist.  Neuromuscular re-education: Pelvic floor contraction training: Therapist finger in the rectum working on rectal contraction with a lift and pulling the puborectalis forwar Exercises: Stretches/mobility: Sitting piriformis stretch holding 30 sec bil.  Sitting trunk rotation holding 30 sec. Bil.        PATIENT EDUCATION:  06/01/23 Education details: Therapist educated patient on bladder irritants, how to fill out bladder diary, and urge to voidAccess Code: [email or web address] Person educated:  Patient Education method: Explanation, Demonstration, Tactile cues, Verbal cues, and Handouts Education comprehension: verbalized understanding, returned demonstration, verbal cues required, tactile cues required, and needs further education  HOME EXERCISE PROGRAM: 06/01/23 Access Code: [email or web address] URL: [email or web address] Date: 06/01/2023 Prepared by: Marsha Skeen  Exercises - Supine Diaphragmatic Breathing  - 2 x daily - 7 x weekly - 1 sets - 10 reps - Diaphragmatic Breathing in Child's Pose with Pelvic Floor Relaxation  - 1 x daily - 7 x weekly - 1 sets - 10 reps - Seated Piriformis Stretch with Trunk Bend  - 1 x daily - 7 x weekly - 1 sets - 2 reps - 30 sec hold - Diaphragmatic Breathing in Child's Pose with Pelvic Floor Relaxation  - 1 x daily - 7 x weekly - 1 sets - 1 reps - 1 min hold - Cat Cow  - 1 x daily - 7 x weekly - 1 sets - 10 reps - Child's Pose with Thread the Needle  - 1 x daily - 7 x weekly - 1 sets - 4 reps - Squat  - 1 x daily - 3 x weekly - 1 sets - 10 reps - Half Deadlift with Kettlebell  - 1 x daily - 3 x weekly - 1 sets - 10 reps - Hooklying Isometric Hip Flexion  -  1 x daily - 3 x weekly - 1 sets - 10 reps - Supine Bridge  - 1 x daily - 3 x weekly - 1 sets - 15 reps - Supine Alternating Shoulder Flexion  - 1 x daily - 3 x weekly - 2 sets - 10 reps - Supine Alternating Arm Punches on Foam Roll  - 1 x daily - 3 x weekly - 2 sets - 10 reps - Hooklying Clamshell with Resistance  - 1 x daily - 3 x weekly - 2 sets - 10 reps - Supine March with Resistance Band  - 1 x daily - 3 x weekly - 2 sets - 10 reps  ASSESSMENT:  CLINICAL IMPRESSION: Patient is a 68 y.o. female who was seen today for physical therapy  treatment for overactive bladder, levator spasm, and fecal incontinence. Patient had s/p Robotic assisted sacrocolpopexy, right salpingo- oophorectomy, cystoscopy on 11/08/22. Patient has not had any urinary leakage since last visit. She is drinking more  water . She has 1 stool leakage episode per week compared to several. Her rectal strength is 3/5 with weak hug after manual work.  Patient will benefit from skilled therapy to improve pelvic floor coordination and strength to reduce leakage.   OBJECTIVE IMPAIRMENTS: decreased coordination, decreased ROM, decreased strength, increased fascial restrictions, and increased muscle spasms.   ACTIVITY LIMITATIONS: continence, toileting, and locomotion level  PARTICIPATION LIMITATIONS: driving, shopping, and community activity  PERSONAL FACTORS: Fitness and 1-2 comorbidities: s/p Robotic assisted sacrocolpopexy, right salpingo- oophorectomy, cystoscopy on 11/08/22 ; Appendectomy 1986; vaginal prolapse repair with hysterectomy 1986;  Left knee replacement  are also affecting patient's functional outcome.   REHAB POTENTIAL: Excellent  CLINICAL DECISION MAKING: Evolving/moderate complexity  EVALUATION COMPLEXITY: Moderate   GOALS: Goals reviewed with patient? Yes  SHORT TERM GOALS: Target date: 06/02/23  Patient educated and understands what bladder irritants are and how they affect the bladder.  Baseline: Goal status: Met 04/11/23  2.  Patient educated on urge to void to reduce the urgency.  Baseline:  Goal status: Met 04/11/23  3.  Patient is able to perform diaphragmatic breathing to relax the pelvic floor.  Baseline:  Goal status: Met 04/18/23   LONG TERM GOALS: Target date: 06/27/23  Patient independent with advanced HEP for core, pelvic floor and hip strength.  Baseline:  Goal status: INITIAL  2.  Patient is able to have the urge to void and walk to the bathroom without leaking urine due to increased in strength.  Baseline:  Goal status: met 06/08/23  3.  Patient is able to go out in public and not worry where the bathroom are due to her urgency decreased >/= 50%.  Baseline:  Goal status: INITIAL  4.  Patient reports her fecal leakage has improved >/= 75% due to increased in pelvic  floor strength >/= 3/5.  Baseline:  Goal status: INITIAL   PLAN:  PT FREQUENCY: 1-2x/week  PT DURATION: 12 weeks  PLANNED INTERVENTIONS: 97110-Therapeutic exercises, 97530- Therapeutic activity, 97112- Neuromuscular re-education, 97535- Self Care, [id number]- Manual therapy, [id number]- Electrical stimulation (unattended), [phone]- Electrical stimulation (manual), 97035- Ultrasound, Patient/Family education, Dry Needling, Joint mobilization, Spinal mobilization, Cryotherapy, Moist heat, and Biofeedback  PLAN FOR NEXT SESSION:   hip flexor stretch, focus on the rectal strength, internal rectal work  Marsha Skeen, PT 06/08/23 9:27 AM

## 2023-06-15 ENCOUNTER — Encounter: Payer: Self-pay | Admitting: Physical Therapy

## 2023-06-15 ENCOUNTER — Encounter: Payer: Self-pay | Attending: Obstetrics and Gynecology | Admitting: Physical Therapy

## 2023-06-15 DIAGNOSIS — R278 Other lack of coordination: Secondary | ICD-10-CM | POA: Insufficient documentation

## 2023-06-15 DIAGNOSIS — M6281 Muscle weakness (generalized): Secondary | ICD-10-CM | POA: Diagnosis present

## 2023-06-15 NOTE — Therapy (Signed)
 OUTPATIENT PHYSICAL THERAPY FEMALE PELVIC TREATMENT   Patient Name: Olivia Paul MRN: [id number] DOB:March 24, 1955, 68 y.o., female Today's Date: 06/15/2023  END OF SESSION:  PT End of Session - 06/15/23 0935     Visit Number 8    Date for PT Re-Evaluation 06/27/23    Authorization Type [employer] medicare    Authorization Time Period 04/04/2023 - 06/27/2023    Authorization - Visit Number 8    Authorization - Number of Visits 12    Progress Note Due on Visit 10    PT Start Time 0930    PT Stop Time 1015    PT Time Calculation (min) 45 min    Activity Tolerance Patient tolerated treatment well    Behavior During Therapy [facility] for tasks assessed/performed             Past Medical History:  Diagnosis Date   Allergy     Anxiety    Arthritis    lt knee   Asthma 2011   Complication of anesthesia    Fatty liver    GERD (gastroesophageal reflux disease)    Headache    sinus   History of COVID-19 10/11/2022   PONV (postoperative nausea and vomiting)    Pre-diabetes    Sleep apnea    cpap set on 4   Vitamin D  deficiency    Wears glasses    Past Surgical History:  Procedure Laterality Date   APPENDECTOMY  1986   BREAST BIOPSY     BREAST EXCISIONAL BIOPSY     CYSTOSCOPY N/A 11/08/2022   Procedure: CYSTOSCOPY;  Surgeon: Arma Lamp, MD;  Location: [facility];  Service: Gynecology;  Laterality: N/A;   KNEE ARTHROSCOPY WITH MENISCAL REPAIR Left 02/21/2018   LIPOMA EXCISION     ROBOTIC ASSISTED LAPAROSCOPIC SACROCOLPOPEXY N/A 11/08/2022   Procedure: XI ROBOTIC ASSISTED LAPAROSCOPIC SACROCOLPOPEXY;  Surgeon: Arma Lamp, MD;  Location: [facility];  Service: Gynecology;  Laterality: N/A;   TOTAL KNEE ARTHROPLASTY Left 11/16/2018   Procedure: TOTAL KNEE ARTHROPLASTY;  Surgeon: Neil Balls, MD;  Location: [facility];  Service: Orthopedics;  Laterality: Left;   total knee arthroplasty-revision Left 08/31/2020   Dr, Aquilla Knapp    TRIGGER FINGER RELEASE Right 12/25/2020   VAGINAL HYSTERECTOMY  1986   VAGINAL PROLAPSE REPAIR     1986   XI ROBOTIC ASSISTED OOPHORECTOMY Right 11/08/2022   Procedure: XI ROBOTIC ASSISTED OOPHORECTOMY;  Surgeon: Arma Lamp, MD;  Location: [facility];  Service: Gynecology;  Laterality: Right;   Patient Active Problem List   Diagnosis Date Noted   Encounter for general adult medical examination w/o abnormal findings 05/16/2023   Elevated blood pressure reading 05/16/2023   Vaginal vault prolapse after hysterectomy 11/08/2022   COVID-19 10/18/2022   Cough in adult 10/18/2022   Pure hypercholesterolemia 05/22/2022   Prediabetes 05/22/2022   OSA on CPAP 05/22/2022   Mood disorder (HCC) 12/27/2021   Urinary retention 09/02/2021   Menopause 09/02/2021   OAB (overactive bladder) 09/02/2021   Daytime sleepiness 07/02/2021   Insomnia 07/02/2021   Episodic tension-type headache, not intractable 03/31/2021   Lumbar radiculopathy 01/29/2021   Acquired trigger finger of right middle finger 11/26/2020   Impingement syndrome of right shoulder region 10/22/2020   Cervical radiculopathy 10/16/2020   Dizziness 08/02/2020   Class 3 severe obesity due to excess calories with serious comorbidity and body mass index (BMI) of 40.0 to 44.9 in adult [facility]) 08/02/2020   Family history of  heart disease 08/02/2020   Primary osteoarthritis of left knee 11/16/2018   Unilateral primary osteoarthritis, left knee 10/29/2018   Xanthelasma of right eyelid 03/26/2018   Anxiety 03/26/2018   Perennial allergic rhinitis 02/02/2018   Vitamin D  deficiency 07/27/2017   Moderate persistent asthma, uncomplicated 05/11/2017   Drug allergy  05/11/2017   Basal cell papilloma 03/20/2015   Changing skin lesion 01/13/2015   Asthma 11/05/2014   Allergic rhinoconjunctivitis 11/05/2014    PCP: Cleave Curling, MD  REFERRING PROVIDER: Arma Lamp, MD   REFERRING DIAG:  N32.81 (ICD-10-CM)  - OAB (overactive bladder)  [phone] (ICD-10-CM) - Levator spasm  R15.9 (ICD-10-CM) - Incontinence of feces, unspecified fecal incontinence type    THERAPY DIAG:  Muscle weakness (generalized)  Other lack of coordination  Rationale for Evaluation and Treatment: Rehabilitation  ONSET DATE: 1983  SUBJECTIVE:                                                                                                                                                                                           SUBJECTIVE STATEMENT: I have not had the complete stool leakage like last time. I have Things are better for this week. I have not seen anything in my underwear. Urinary leakage is less. Patient is able to make it to the bathroom without leakage. She has not been wearing a pad.   PAIN:  Are you having pain? No   PRECAUTIONS: None  RED FLAGS: None   WEIGHT BEARING RESTRICTIONS: No  FALLS:  Has patient fallen in last 6 months? Yes. Number of falls 1 time and slipped on ice not due to balance.  OCCUPATION: retired  ACTIVITY LEVEL : water  aerobics  PLOF: Independent  PATIENT GOALS: reduce urinary leakage, not know where every bathroom is in the store  PERTINENT HISTORY:  s/p Robotic assisted sacrocolpopexy, right salpingo- oophorectomy, cystoscopy on 11/08/22 ; Appendectomy 1986; vaginal prolapse repair with hysterectomy 1986;  Left knee replacement Sexual abuse: No  BOWEL MOVEMENT: Pain with bowel movement: No Type of bowel movement:Type (Bristol Stool Scale) Type 4 and Frequency 1-2 days Fully empty rectum: Yes:   Leakage: Yes: does not always realize she had stool leakage but has not had it in the last several weeks Fiber supplement/laxative Yes  and Metamucil  URINATION: Pain with urination: No Fully empty bladder: Yes:   Stream: Strong Urgency: Yes leaks when pulling her pants down Frequency: average Leakage: Urge to void, Walking to the bathroom, and during the night Pads:  Yes: wears a pad when goes out for activity  INTERCOURSE: no activity  Pain with intercourse: none  PREGNANCY: Vaginal deliveries 2  Tearing No Episiotomy Yes   PROLAPSE: None   OBJECTIVE:  Note: Objective measures were completed at Evaluation unless otherwise noted.  DIAGNOSTIC FINDINGS:  none  PATIENT SURVEYS:  PFIQ-7: 63; UIQ-7 43; CRAIQ-7 43  COGNITION: Overall cognitive status: Within functional limits for tasks assessed     SENSATION: Light touch: Appears intact   POSTURE: rounded shoulders, forward head, and decreased lumbar lordosis   LUMBARAROM/PROM: Lumbar ROM decreased by 25%   LOWER EXTREMITY ROM:  Passive ROM Right eval Left eval  Hip external rotation 80 50   (Blank rows = not tested)  LOWER EXTREMITY MMT:  MMT Right eval Left eval  Hip flexion 4/5 4/5  Hip extension 3/5 3/5  Hip abduction 3-/5 3/5   (Blank rows = not tested) PALPATION:    Abdominal: Decreased movement of the lower rib cage, tightness in the diaphragm                External Perineal Exam: intact but dry                             Internal Pelvic Floor: tightness throughout the pelvic floor  Patient confirms identification and approves PT to assess internal pelvic floor and treatment Yes  PELVIC MMT:   MMT eval 04/18/23 05/25/23 06/08/23 06/15/23  Vaginal 1/5 2/5     Internal Anal Sphincter 1/5  3/5 weak hug 3/5 weak hug 3/5  External Anal Sphincter 1/5  3/5 weak hug 3/5 weak hug 3/5  Puborectalis 1/5  3/5 weak hug 3/5 weak hug 3/5  Diastasis Recti 1/5  N/a    (Blank rows = not tested)        TONE: increased  PROLAPSE: none  TODAY'S TREATMENT:   06/15/23 Manual: Internal pelvic floor techniques: No emotional/communication barriers or cognitive limitation. Patient is motivated to learn. Patient understands and agrees with treatment goals and plan. PT explains patient will be examined in standing, sitting, and lying down to see how their muscles and joints work.  When they are ready, they will be asked to remove their underwear so PT can examine their perineum. The patient is also given the option of providing their own chaperone as one is not provided in our facility. The patient also has the right and is explained the right to defer or refuse any part of the evaluation or treatment including the internal exam. With the patient's consent, PT will use one gloved finger to gently assess the muscles of the pelvic floor, seeing how well it contracts and relaxes and if there is muscle symmetry. After, the patient will get dressed and PT and patient will discuss exam findings and plan of care. PT and patient discuss plan of care, schedule, attendance policy and HEP activities.  Going through the rectum with a gloved finger working on the puborectalis, along the iliococcygeus, coccygeus with one finger internal and other external External manual work to the perineal body going through the restrictions Neuromuscular re-education: Pelvic floor contraction training: Therapist finger in the rectum and patients hand on the coccyx feeling the coccyx move with contraction, verbal cues to contract the rectum with breathing out so she is not bearing down, contracting the lower abdominals, tactile cues to the puborectalis to come forward, have the patient resist the therapist finger as it is being pulled out of the rectum     06/08/23 Manual: Internal pelvic floor techniques: No emotional/communication barriers or cognitive limitation. Patient is  motivated to learn. Patient understands and agrees with treatment goals and plan. PT explains patient will be examined in standing, sitting, and lying down to see how their muscles and joints work. When they are ready, they will be asked to remove their underwear so PT can examine their perineum. The patient is also given the option of providing their own chaperone as one is not provided in our facility. The patient also has the right and  is explained the right to defer or refuse any part of the evaluation or treatment including the internal exam. With the patient's consent, PT will use one gloved finger to gently assess the muscles of the pelvic floor, seeing how well it contracts and relaxes and if there is muscle symmetry. After, the patient will get dressed and PT and patient will discuss exam findings and plan of care. PT and patient discuss plan of care, schedule, attendance policy and HEP activities.  Going through the rectum working on the puborectalis, distraction of the coccyx, anococcygeal ligament, obturator internist to elongate the muscles and reduce restrictions for a better rectal contraction Neuromuscular re-education: Pelvic floor contraction training: Therapist finger in the rectum working on rectal strength giving tactile cues around the outside of the rectum and internally for the lift Standing hip adduction with red band with rectal contraction and red band Standing hip adduction with extension, red band and contraction of rectum Standing hip extension with knee flexed leaning on the counter with tactile cues to contract the abdominals and rectum    06/01/23 Neuromuscular re-education: Core facilitation:( all exercises with pelvic floor and core engagement) Supine hip flexion isometrics  10 x each side Supine bridge holding 3 # wt in air to engage the abdominals Supine holding 1# in hands and alternate flexion with core engaged. Then used red band around legs Supine holding 1 # punching on diagonal to work the obliques then used the red band Supine marching with red band around her knees Supine clamshell with red band Therapeutic activities: Functional strengthening activities: Educated patient on Lifting with arms and legs and breathing out to reduce pressure on the pelvic floor and back Educated patient in pushing objects using arms and legs and not holding breath to reduce pressure on the pelvic floor and  back    PATIENT EDUCATION:  06/01/23 Education details: Therapist educated patient on bladder irritants, how to fill out bladder diary, and urge to voidAccess Code: [email or web address] Person educated: Patient Education method: Explanation, Demonstration, Tactile cues, Verbal cues, and Handouts Education comprehension: verbalized understanding, returned demonstration, verbal cues required, tactile cues required, and needs further education  HOME EXERCISE PROGRAM: 06/01/23 Access Code: [email or web address] URL: [email or web address] Date: 06/01/2023 Prepared by: Marsha Skeen  Exercises - Supine Diaphragmatic Breathing  - 2 x daily - 7 x weekly - 1 sets - 10 reps - Diaphragmatic Breathing in Child's Pose with Pelvic Floor Relaxation  - 1 x daily - 7 x weekly - 1 sets - 10 reps - Seated Piriformis Stretch with Trunk Bend  - 1 x daily - 7 x weekly - 1 sets - 2 reps - 30 sec hold - Diaphragmatic Breathing in Child's Pose with Pelvic Floor Relaxation  - 1 x daily - 7 x weekly - 1 sets - 1 reps - 1 min hold - Cat Cow  - 1 x daily - 7 x weekly - 1 sets - 10 reps - Child's Pose with Thread the Needle  - 1 x daily - 7 x weekly - 1  sets - 4 reps - Squat  - 1 x daily - 3 x weekly - 1 sets - 10 reps - Half Deadlift with Kettlebell  - 1 x daily - 3 x weekly - 1 sets - 10 reps - Hooklying Isometric Hip Flexion  - 1 x daily - 3 x weekly - 1 sets - 10 reps - Supine Bridge  - 1 x daily - 3 x weekly - 1 sets - 15 reps - Supine Alternating Shoulder Flexion  - 1 x daily - 3 x weekly - 2 sets - 10 reps - Supine Alternating Arm Punches on Foam Roll  - 1 x daily - 3 x weekly - 2 sets - 10 reps - Hooklying Clamshell with Resistance  - 1 x daily - 3 x weekly - 2 sets - 10 reps - Supine March with Resistance Band  - 1 x daily - 3 x weekly - 2 sets - 10 reps  ASSESSMENT:  CLINICAL IMPRESSION: Patient is a 68 y.o. female who was seen today for physical therapy  treatment for overactive bladder, levator spasm, and fecal  incontinence. Patient had s/p Robotic assisted sacrocolpopexy, right salpingo- oophorectomy, cystoscopy on 11/08/22. Patient is able to make it to the bathroom without leakage. She has not been wearing a pad. Patient has not had any urinary leakage since last visit. She is drinking more water .Her rectal strength is 3/5 with weak hug after manual work. She is able to feel the rectum come forward with pelvic floor contraction. Patient has not had the complete stool leakage like last time. Patient will benefit from skilled therapy to improve pelvic floor coordination and strength to reduce leakage.   OBJECTIVE IMPAIRMENTS: decreased coordination, decreased ROM, decreased strength, increased fascial restrictions, and increased muscle spasms.   ACTIVITY LIMITATIONS: continence, toileting, and locomotion level  PARTICIPATION LIMITATIONS: driving, shopping, and community activity  PERSONAL FACTORS: Fitness and 1-2 comorbidities: s/p Robotic assisted sacrocolpopexy, right salpingo- oophorectomy, cystoscopy on 11/08/22 ; Appendectomy 1986; vaginal prolapse repair with hysterectomy 1986;  Left knee replacement  are also affecting patient's functional outcome.   REHAB POTENTIAL: Excellent  CLINICAL DECISION MAKING: Evolving/moderate complexity  EVALUATION COMPLEXITY: Moderate   GOALS: Goals reviewed with patient? Yes  SHORT TERM GOALS: Target date: 06/02/23  Patient educated and understands what bladder irritants are and how they affect the bladder.  Baseline: Goal status: Met 04/11/23  2.  Patient educated on urge to void to reduce the urgency.  Baseline:  Goal status: Met 04/11/23  3.  Patient is able to perform diaphragmatic breathing to relax the pelvic floor.  Baseline:  Goal status: Met 04/18/23   LONG TERM GOALS: Target date: 06/27/23  Patient independent with advanced HEP for core, pelvic floor and hip strength.  Baseline:  Goal status: INITIAL  2.  Patient is able to have the urge to  void and walk to the bathroom without leaking urine due to increased in strength.  Baseline:  Goal status: met 06/08/23  3.  Patient is able to go out in public and not worry where the bathroom are due to her urgency decreased >/= 50%.  Baseline:  Goal status: INITIAL  4.  Patient reports her fecal leakage has improved >/= 75% due to increased in pelvic floor strength >/= 3/5.  Baseline:  Goal status: INITIAL   PLAN:  PT FREQUENCY: 1-2x/week  PT DURATION: 12 weeks  PLANNED INTERVENTIONS: 97110-Therapeutic exercises, 97530- Therapeutic activity, [id number]- Neuromuscular re-education, 97535- Self Care, [id number]- Manual therapy, [id number]- Electrical  stimulation (unattended), [id number]- Electrical stimulation (manual), 60630- Ultrasound, Patient/Family education, Dry Needling, Joint mobilization, Spinal mobilization, Cryotherapy, Moist heat, and Biofeedback  PLAN FOR NEXT SESSION:   hip flexor stretch, core strength  Marsha Skeen, PT 06/15/23 10:21 AM

## 2023-06-20 ENCOUNTER — Encounter: Payer: Self-pay | Admitting: Physical Therapy

## 2023-06-20 DIAGNOSIS — R278 Other lack of coordination: Secondary | ICD-10-CM

## 2023-06-20 DIAGNOSIS — M6281 Muscle weakness (generalized): Secondary | ICD-10-CM | POA: Diagnosis not present

## 2023-06-20 NOTE — Therapy (Signed)
 OUTPATIENT PHYSICAL THERAPY FEMALE PELVIC TREATMENT   Patient Name: Olivia Paul MRN: [id number] DOB:Jan 29, 1956, 68 y.o., female Today's Date: 06/20/2023  END OF SESSION:  PT End of Session - 06/20/23 0941     Visit Number 9    Date for PT Re-Evaluation 10/20/23    Authorization Type [employer] medicare    Authorization Time Period 04/04/2023 - 06/27/2023    Authorization - Visit Number 9    Authorization - Number of Visits 12    Progress Note Due on Visit 10    PT Start Time 0930    PT Stop Time 1020    PT Time Calculation (min) 50 min    Activity Tolerance Patient tolerated treatment well    Behavior During Therapy [facility] for tasks assessed/performed             Past Medical History:  Diagnosis Date   Allergy     Anxiety    Arthritis    lt knee   Asthma 2011   Complication of anesthesia    Fatty liver    GERD (gastroesophageal reflux disease)    Headache    sinus   History of COVID-19 10/11/2022   PONV (postoperative nausea and vomiting)    Pre-diabetes    Sleep apnea    cpap set on 4   Vitamin D  deficiency    Wears glasses    Past Surgical History:  Procedure Laterality Date   APPENDECTOMY  1986   BREAST BIOPSY     BREAST EXCISIONAL BIOPSY     CYSTOSCOPY N/A 11/08/2022   Procedure: CYSTOSCOPY;  Surgeon: Arma Lamp, MD;  Location: [facility];  Service: Gynecology;  Laterality: N/A;   KNEE ARTHROSCOPY WITH MENISCAL REPAIR Left 02/21/2018   LIPOMA EXCISION     ROBOTIC ASSISTED LAPAROSCOPIC SACROCOLPOPEXY N/A 11/08/2022   Procedure: XI ROBOTIC ASSISTED LAPAROSCOPIC SACROCOLPOPEXY;  Surgeon: Arma Lamp, MD;  Location: [facility];  Service: Gynecology;  Laterality: N/A;   TOTAL KNEE ARTHROPLASTY Left 11/16/2018   Procedure: TOTAL KNEE ARTHROPLASTY;  Surgeon: Neil Balls, MD;  Location: [facility];  Service: Orthopedics;  Laterality: Left;   total knee arthroplasty-revision Left 08/31/2020   Dr, Aquilla Knapp    TRIGGER FINGER RELEASE Right 12/25/2020   VAGINAL HYSTERECTOMY  1986   VAGINAL PROLAPSE REPAIR     1986   XI ROBOTIC ASSISTED OOPHORECTOMY Right 11/08/2022   Procedure: XI ROBOTIC ASSISTED OOPHORECTOMY;  Surgeon: Arma Lamp, MD;  Location: [facility];  Service: Gynecology;  Laterality: Right;   Patient Active Problem List   Diagnosis Date Noted   Encounter for general adult medical examination w/o abnormal findings 05/16/2023   Elevated blood pressure reading 05/16/2023   Vaginal vault prolapse after hysterectomy 11/08/2022   COVID-19 10/18/2022   Cough in adult 10/18/2022   Pure hypercholesterolemia 05/22/2022   Prediabetes 05/22/2022   OSA on CPAP 05/22/2022   Mood disorder (HCC) 12/27/2021   Urinary retention 09/02/2021   Menopause 09/02/2021   OAB (overactive bladder) 09/02/2021   Daytime sleepiness 07/02/2021   Insomnia 07/02/2021   Episodic tension-type headache, not intractable 03/31/2021   Lumbar radiculopathy 01/29/2021   Acquired trigger finger of right middle finger 11/26/2020   Impingement syndrome of right shoulder region 10/22/2020   Cervical radiculopathy 10/16/2020   Dizziness 08/02/2020   Class 3 severe obesity due to excess calories with serious comorbidity and body mass index (BMI) of 40.0 to 44.9 in adult 08/02/2020   Family history of heart  disease 08/02/2020   Primary osteoarthritis of left knee 11/16/2018   Unilateral primary osteoarthritis, left knee 10/29/2018   Xanthelasma of right eyelid 03/26/2018   Anxiety 03/26/2018   Perennial allergic rhinitis 02/02/2018   Vitamin D  deficiency 07/27/2017   Moderate persistent asthma, uncomplicated 05/11/2017   Drug allergy  05/11/2017   Basal cell papilloma 03/20/2015   Changing skin lesion 01/13/2015   Asthma 11/05/2014   Allergic rhinoconjunctivitis 11/05/2014    PCP: Cleave Curling, MD  REFERRING PROVIDER: Arma Lamp, MD   REFERRING DIAG:  N32.81 (ICD-10-CM) - OAB  (overactive bladder)  [phone] (ICD-10-CM) - Levator spasm  R15.9 (ICD-10-CM) - Incontinence of feces, unspecified fecal incontinence type    THERAPY DIAG:  Muscle weakness (generalized)  Other lack of coordination  Rationale for Evaluation and Treatment: Rehabilitation  ONSET DATE: 1983  SUBJECTIVE:                                                                                                                                                                                           SUBJECTIVE STATEMENT: I have not had stool leakage last week. I felt I had to have a bowel movement but I was able to make it.  PAIN:  Are you having pain? No   PRECAUTIONS: None  RED FLAGS: None   WEIGHT BEARING RESTRICTIONS: No  FALLS:  Has patient fallen in last 6 months? Yes. Number of falls 1 time and slipped on ice not due to balance.  OCCUPATION: retired  ACTIVITY LEVEL : water  aerobics  PLOF: Independent  PATIENT GOALS: reduce urinary leakage, not know where every bathroom is in the store  PERTINENT HISTORY:  s/p Robotic assisted sacrocolpopexy, right salpingo- oophorectomy, cystoscopy on 11/08/22 ; Appendectomy 1986; vaginal prolapse repair with hysterectomy 1986;  Left knee replacement Sexual abuse: No  BOWEL MOVEMENT: Pain with bowel movement: No Type of bowel movement:Type (Bristol Stool Scale) Type 4 and Frequency 1-2 days Fully empty rectum: Yes:   Leakage: Yes: does not always realize she had stool leakage but has not had it in the last several weeks Fiber supplement/laxative Yes  and Metamucil  URINATION: Pain with urination: No Fully empty bladder: Yes:   Stream: Strong Urgency: Yes leaks when pulling her pants down Frequency: average Leakage: Urge to void, Walking to the bathroom, and during the night Pads: Yes: wears a pad when goes out for activity  INTERCOURSE: no activity  Pain with intercourse: none  PREGNANCY: Vaginal deliveries 2 Tearing  No Episiotomy Yes   PROLAPSE: None   OBJECTIVE:  Note: Objective measures were completed at Evaluation unless otherwise noted.  DIAGNOSTIC FINDINGS:  none  PATIENT SURVEYS:  PFIQ-7: 82; UIQ-7 43; CRAIQ-7 43 06/19/24: PFIQ-7: 61; UIQ-7 33; CRAIQ-7 29  COGNITION: Overall cognitive status: Within functional limits for tasks assessed     SENSATION: Light touch: Appears intact   POSTURE: rounded shoulders, forward head, and decreased lumbar lordosis   LUMBARAROM/PROM: Lumbar ROM decreased by 25%   LOWER EXTREMITY ROM:  Passive ROM Right eval Left eval Left 06/20/23  Hip external rotation 80 50 70   (Blank rows = not tested)  LOWER EXTREMITY MMT:  MMT Right eval Left eval Right 06/20/23 Left 06/20/23  Hip flexion 4/5 4/5 4+/5 4+/5  Hip extension 3/5 3/5 3+/5 4/5  Hip abduction 3-/5 3/5 3+/5 3+/5   (Blank rows = not tested) PALPATION:    Abdominal: Decreased movement of the lower rib cage, tightness in the diaphragm                External Perineal Exam: intact but dry                             Internal Pelvic Floor: tightness throughout the pelvic floor  Patient confirms identification and approves PT to assess internal pelvic floor and treatment Yes  PELVIC MMT:   MMT eval 04/18/23 05/25/23 06/08/23 06/15/23  Vaginal 1/5 2/5     Internal Anal Sphincter 1/5  3/5 weak hug 3/5 weak hug 3/5  External Anal Sphincter 1/5  3/5 weak hug 3/5 weak hug 3/5  Puborectalis 1/5  3/5 weak hug 3/5 weak hug 3/5  Diastasis Recti 1/5  N/a    (Blank rows = not tested)        TONE: increased  PROLAPSE: none  TODAY'S TREATMENT:   06/20/23 Manual: Soft tissue mobilization: Manual work to right quadriceps, hip adductors, and hip flexor  Neuromuscular re-education: Form correction: Laying on side with active assistive hip abduction with core engagement for patient to learn how to work through the full range then hold at end range Bridge to work through the full range and feel  the gluteal squeeze Lay on side with manual stretch of hip flexors then have patient work through the range of motion for muscle memory Step up on 3 level step with core and gluteal engagement  Leaning on counter lifting opposite arm and leg with core engaged  06/15/23 Manual: Internal pelvic floor techniques: No emotional/communication barriers or cognitive limitation. Patient is motivated to learn. Patient understands and agrees with treatment goals and plan. PT explains patient will be examined in standing, sitting, and lying down to see how their muscles and joints work. When they are ready, they will be asked to remove their underwear so PT can examine their perineum. The patient is also given the option of providing their own chaperone as one is not provided in our facility. The patient also has the right and is explained the right to defer or refuse any part of the evaluation or treatment including the internal exam. With the patient's consent, PT will use one gloved finger to gently assess the muscles of the pelvic floor, seeing how well it contracts and relaxes and if there is muscle symmetry. After, the patient will get dressed and PT and patient will discuss exam findings and plan of care. PT and patient discuss plan of care, schedule, attendance policy and HEP activities.  Going through the rectum with a gloved finger working on the puborectalis, along the iliococcygeus, coccygeus with one finger internal and other  external External manual work to the perineal body going through the restrictions Neuromuscular re-education: Pelvic floor contraction training: Therapist finger in the rectum and patients hand on the coccyx feeling the coccyx move with contraction, verbal cues to contract the rectum with breathing out so she is not bearing down, contracting the lower abdominals, tactile cues to the puborectalis to come forward, have the patient resist the therapist finger as it is being pulled out  of the rectum     06/08/23 Manual: Internal pelvic floor techniques: No emotional/communication barriers or cognitive limitation. Patient is motivated to learn. Patient understands and agrees with treatment goals and plan. PT explains patient will be examined in standing, sitting, and lying down to see how their muscles and joints work. When they are ready, they will be asked to remove their underwear so PT can examine their perineum. The patient is also given the option of providing their own chaperone as one is not provided in our facility. The patient also has the right and is explained the right to defer or refuse any part of the evaluation or treatment including the internal exam. With the patient's consent, PT will use one gloved finger to gently assess the muscles of the pelvic floor, seeing how well it contracts and relaxes and if there is muscle symmetry. After, the patient will get dressed and PT and patient will discuss exam findings and plan of care. PT and patient discuss plan of care, schedule, attendance policy and HEP activities.  Going through the rectum working on the puborectalis, distraction of the coccyx, anococcygeal ligament, obturator internist to elongate the muscles and reduce restrictions for a better rectal contraction Neuromuscular re-education: Pelvic floor contraction training: Therapist finger in the rectum working on rectal strength giving tactile cues around the outside of the rectum and internally for the lift Standing hip adduction with red band with rectal contraction and red band Standing hip adduction with extension, red band and contraction of rectum Standing hip extension with knee flexed leaning on the counter with tactile cues to contract the abdominals and rectum    PATIENT EDUCATION:  06/01/23 Education details: Therapist educated patient on bladder irritants, how to fill out bladder diary, and urge to voidAccess Code: [email or web address] Person educated:  Patient Education method: Explanation, Demonstration, Tactile cues, Verbal cues, and Handouts Education comprehension: verbalized understanding, returned demonstration, verbal cues required, tactile cues required, and needs further education  HOME EXERCISE PROGRAM: 06/01/23 Access Code: [email or web address] URL: [email or web address] Date: 06/01/2023 Prepared by: Marsha Skeen  Exercises - Supine Diaphragmatic Breathing  - 2 x daily - 7 x weekly - 1 sets - 10 reps - Diaphragmatic Breathing in Child's Pose with Pelvic Floor Relaxation  - 1 x daily - 7 x weekly - 1 sets - 10 reps - Seated Piriformis Stretch with Trunk Bend  - 1 x daily - 7 x weekly - 1 sets - 2 reps - 30 sec hold - Diaphragmatic Breathing in Child's Pose with Pelvic Floor Relaxation  - 1 x daily - 7 x weekly - 1 sets - 1 reps - 1 min hold - Cat Cow  - 1 x daily - 7 x weekly - 1 sets - 10 reps - Child's Pose with Thread the Needle  - 1 x daily - 7 x weekly - 1 sets - 4 reps - Squat  - 1 x daily - 3 x weekly - 1 sets - 10 reps - Half Deadlift with Kettlebell  - 1 x daily - 3 x  weekly - 1 sets - 10 reps - Hooklying Isometric Hip Flexion  - 1 x daily - 3 x weekly - 1 sets - 10 reps - Supine Bridge  - 1 x daily - 3 x weekly - 1 sets - 15 reps - Supine Alternating Shoulder Flexion  - 1 x daily - 3 x weekly - 2 sets - 10 reps - Supine Alternating Arm Punches on Foam Roll  - 1 x daily - 3 x weekly - 2 sets - 10 reps - Hooklying Clamshell with Resistance  - 1 x daily - 3 x weekly - 2 sets - 10 reps - Supine March with Resistance Band  - 1 x daily - 3 x weekly - 2 sets - 10 reps  ASSESSMENT:  CLINICAL IMPRESSION: Patient is a 68 y.o. female who was seen today for physical therapy  treatment for overactive bladder, levator spasm, and fecal incontinence. Patient had s/p Robotic assisted sacrocolpopexy, right salpingo- oophorectomy, cystoscopy on 11/08/22.  Patient is able to go to the bathroom in public now. She is able to drink more  water  due to less fear of leakage in the public bathroom. She is able to make it to the bathroom to pull her pants down without leakage but has to rush.  Her rectal strength is 3/5 and vaginal strength is 2/5. She is able to feel her gluteal and rectum contract after today session. She still needs verbal cues to breath on the hardest part of the exercise to put less strain on the pelvic floor. Her PFIQ-7 score improved by by 21 points. Patient will benefit from skilled therapy to improve pelvic floor coordination and strength to reduce leakage.   OBJECTIVE IMPAIRMENTS: decreased coordination, decreased ROM, decreased strength, increased fascial restrictions, and increased muscle spasms.   ACTIVITY LIMITATIONS: continence, toileting, and locomotion level  PARTICIPATION LIMITATIONS: driving, shopping, and community activity  PERSONAL FACTORS: Fitness and 1-2 comorbidities: s/p Robotic assisted sacrocolpopexy, right salpingo- oophorectomy, cystoscopy on 11/08/22 ; Appendectomy 1986; vaginal prolapse repair with hysterectomy 1986;  Left knee replacement  are also affecting patient's functional outcome.   REHAB POTENTIAL: Excellent  CLINICAL DECISION MAKING: Evolving/moderate complexity  EVALUATION COMPLEXITY: Moderate   GOALS: Goals reviewed with patient? Yes  SHORT TERM GOALS: Target date: 06/02/23  Patient educated and understands what bladder irritants are and how they affect the bladder.  Baseline: Goal status: Met 04/11/23  2.  Patient educated on urge to void to reduce the urgency.  Baseline:  Goal status: Met 04/11/23  3.  Patient is able to perform diaphragmatic breathing to relax the pelvic floor.  Baseline:  Goal status: Met 04/18/23   LONG TERM GOALS: Target date: 10/20/23  Patient independent with advanced HEP for core, pelvic floor and hip strength.  Baseline:  Goal status: Ongoing 06/20/23  2.  Patient is able to have the urge to void and walk to the bathroom without leaking  urine due to increased in strength.  Baseline:  Goal status: met 06/08/23  3.  Patient is able to go out in public and not worry where the bathroom are due to her urgency decreased >/= 50%.  Baseline:  Goal status: ongoing 06/20/23  4.  Patient reports her fecal leakage has improved >/= 75% due to increased in pelvic floor strength >/= 3/5.  Baseline:  Goal status: ongoing 06/20/23   PLAN:  PT FREQUENCY: 1-2x/week  PT DURATION: 12 weeks  PLANNED INTERVENTIONS: 97110-Therapeutic exercises, 97530- Therapeutic activity, [id number]- Neuromuscular re-education, 97535- Self Care,  [id number]- Manual therapy, [id number]- Electrical stimulation (unattended), [id number]- Electrical stimulation (manual), [id number]- Ultrasound, Patient/Family education, Dry Needling, Joint mobilization, Spinal mobilization, Cryotherapy, Moist heat, and Biofeedback  PLAN FOR NEXT SESSION:   hip flexor stretch, core strength  Marsha Skeen, PT 06/20/23 10:50 AM

## 2023-06-27 ENCOUNTER — Encounter: Payer: Self-pay | Admitting: Physical Therapy

## 2023-06-27 DIAGNOSIS — R278 Other lack of coordination: Secondary | ICD-10-CM

## 2023-06-27 DIAGNOSIS — M6281 Muscle weakness (generalized): Secondary | ICD-10-CM | POA: Diagnosis not present

## 2023-06-27 NOTE — Therapy (Signed)
 OUTPATIENT PHYSICAL THERAPY FEMALE PELVIC TREATMENT   Patient Name: Olivia Paul MRN: [id number] DOB:06-14-1955, 68 y.o., female Today's Date: 06/27/2023 Progress Note Reporting Period 04/04/23 to 06/27/23  See note below for Objective Data and Assessment of Progress/Goals.      END OF SESSION:  PT End of Session - 06/27/23 1411     Visit Number 10    Date for PT Re-Evaluation 10/20/23    Authorization Type [employer] medicare    Authorization Time Period 04/04/2023 - 06/27/2023    Authorization - Visit Number 10    Authorization - Number of Visits 12    Progress Note Due on Visit 10    PT Start Time 1300    PT Stop Time 1355    PT Time Calculation (min) 55 min    Activity Tolerance Patient tolerated treatment well    Behavior During Therapy [facility] for tasks assessed/performed              Past Medical History:  Diagnosis Date   Allergy     Anxiety    Arthritis    lt knee   Asthma 2011   Complication of anesthesia    Fatty liver    GERD (gastroesophageal reflux disease)    Headache    sinus   History of COVID-19 10/11/2022   PONV (postoperative nausea and vomiting)    Pre-diabetes    Sleep apnea    cpap set on 4   Vitamin D  deficiency    Wears glasses    Past Surgical History:  Procedure Laterality Date   APPENDECTOMY  1986   BREAST BIOPSY     BREAST EXCISIONAL BIOPSY     CYSTOSCOPY N/A 11/08/2022   Procedure: CYSTOSCOPY;  Surgeon: Arma Lamp, MD;  Location: [facility];  Service: Gynecology;  Laterality: N/A;   KNEE ARTHROSCOPY WITH MENISCAL REPAIR Left 02/21/2018   LIPOMA EXCISION     ROBOTIC ASSISTED LAPAROSCOPIC SACROCOLPOPEXY N/A 11/08/2022   Procedure: XI ROBOTIC ASSISTED LAPAROSCOPIC SACROCOLPOPEXY;  Surgeon: Arma Lamp, MD;  Location: [facility];  Service: Gynecology;  Laterality: N/A;   TOTAL KNEE ARTHROPLASTY Left 11/16/2018   Procedure: TOTAL KNEE ARTHROPLASTY;  Surgeon: Neil Balls, MD;   Location: [facility];  Service: Orthopedics;  Laterality: Left;   total knee arthroplasty-revision Left 08/31/2020   Dr, Aquilla Knapp   TRIGGER FINGER RELEASE Right 12/25/2020   VAGINAL HYSTERECTOMY  1986   VAGINAL PROLAPSE REPAIR     1986   XI ROBOTIC ASSISTED OOPHORECTOMY Right 11/08/2022   Procedure: XI ROBOTIC ASSISTED OOPHORECTOMY;  Surgeon: Arma Lamp, MD;  Location: [facility];  Service: Gynecology;  Laterality: Right;   Patient Active Problem List   Diagnosis Date Noted   Encounter for general adult medical examination w/o abnormal findings 05/16/2023   Elevated blood pressure reading 05/16/2023   Vaginal vault prolapse after hysterectomy 11/08/2022   COVID-19 10/18/2022   Cough in adult 10/18/2022   Pure hypercholesterolemia 05/22/2022   Prediabetes 05/22/2022   OSA on CPAP 05/22/2022   Mood disorder (HCC) 12/27/2021   Urinary retention 09/02/2021   Menopause 09/02/2021   OAB (overactive bladder) 09/02/2021   Daytime sleepiness 07/02/2021   Insomnia 07/02/2021   Episodic tension-type headache, not intractable 03/31/2021   Lumbar radiculopathy 01/29/2021   Acquired trigger finger of right middle finger 11/26/2020   Impingement syndrome of right shoulder region 10/22/2020   Cervical radiculopathy 10/16/2020   Dizziness 08/02/2020   Class 3 severe obesity due to  excess calories with serious comorbidity and body mass index (BMI) of 40.0 to 44.9 in adult 08/02/2020   Family history of heart disease 08/02/2020   Primary osteoarthritis of left knee 11/16/2018   Unilateral primary osteoarthritis, left knee 10/29/2018   Xanthelasma of right eyelid 03/26/2018   Anxiety 03/26/2018   Perennial allergic rhinitis 02/02/2018   Vitamin D  deficiency 07/27/2017   Moderate persistent asthma, uncomplicated 05/11/2017   Drug allergy  05/11/2017   Basal cell papilloma 03/20/2015   Changing skin lesion 01/13/2015   Asthma 11/05/2014   Allergic  rhinoconjunctivitis 11/05/2014    PCP: Cleave Curling, MD  REFERRING PROVIDER: Arma Lamp, MD   REFERRING DIAG:  N32.81 (ICD-10-CM) - OAB (overactive bladder)  [phone] (ICD-10-CM) - Levator spasm  R15.9 (ICD-10-CM) - Incontinence of feces, unspecified fecal incontinence type    THERAPY DIAG:  Muscle weakness (generalized)  Other lack of coordination  Rationale for Evaluation and Treatment: Rehabilitation  ONSET DATE: 1983  SUBJECTIVE:                                                                                                                                                                                           SUBJECTIVE STATEMENT: I had stool leakage yesterday. I will have a MRI of my head on Saturday. I did not know the stool came out. I still leak urine due to the underwear having a urine smell. I can make it to the toilet without having a urinary leakage to clean up the floor. I am drinking liquids. I can delay the urinary urge now.    PAIN:  Are you having pain? No   PRECAUTIONS: None  RED FLAGS: None   WEIGHT BEARING RESTRICTIONS: No  FALLS:  Has patient fallen in last 6 months? Yes. Number of falls 1 time and slipped on ice not due to balance.  OCCUPATION: retired  ACTIVITY LEVEL : water  aerobics  PLOF: Independent  PATIENT GOALS: reduce urinary leakage, not know where every bathroom is in the store  PERTINENT HISTORY:  s/p Robotic assisted sacrocolpopexy, right salpingo- oophorectomy, cystoscopy on 11/08/22 ; Appendectomy 1986; vaginal prolapse repair with hysterectomy 1986;  Left knee replacement Sexual abuse: No  BOWEL MOVEMENT: Pain with bowel movement: No Type of bowel movement:Type (Bristol Stool Scale) Type 4 and Frequency 1-2 days Fully empty rectum: Yes:   Leakage: Yes: does not always realize she had stool leakage but has not had it in the last several weeks Fiber supplement/laxative Yes  and Metamucil  URINATION: Pain  with urination: No Fully empty bladder: Yes:   Stream: Strong Urgency: Yes leaks when pulling her pants down Frequency:  average Leakage: Urge to void, Walking to the bathroom, and during the night Pads: Yes: wears a pad when goes out for activity  INTERCOURSE: no activity  Pain with intercourse: none  PREGNANCY: Vaginal deliveries 2 Tearing No Episiotomy Yes   PROLAPSE: None   OBJECTIVE:  Note: Objective measures were completed at Evaluation unless otherwise noted.  DIAGNOSTIC FINDINGS:  none  PATIENT SURVEYS:  PFIQ-7: 44; UIQ-7 43; CRAIQ-7 43 06/19/24: PFIQ-7: 61; UIQ-7 33; CRAIQ-7 29  COGNITION: Overall cognitive status: Within functional limits for tasks assessed     SENSATION: Light touch: Appears intact   POSTURE: rounded shoulders, forward head, and decreased lumbar lordosis   LUMBARAROM/PROM: Lumbar ROM decreased by 25%   LOWER EXTREMITY ROM:  Passive ROM Right eval Left eval Left 06/20/23  Hip external rotation 80 50 70   (Blank rows = not tested)  LOWER EXTREMITY MMT:  MMT Right eval Left eval Right 06/20/23 Left 06/20/23  Hip flexion 4/5 4/5 4+/5 4+/5  Hip extension 3/5 3/5 3+/5 4/5  Hip abduction 3-/5 3/5 3+/5 3+/5   (Blank rows = not tested) PALPATION:    Abdominal: Decreased movement of the lower rib cage, tightness in the diaphragm                External Perineal Exam: intact but dry                             Internal Pelvic Floor: tightness throughout the pelvic floor  Patient confirms identification and approves PT to assess internal pelvic floor and treatment Yes  PELVIC MMT:   MMT eval 04/18/23 05/25/23 06/08/23 06/15/23  Vaginal 1/5 2/5     Internal Anal Sphincter 1/5  3/5 weak hug 3/5 weak hug 3/5  External Anal Sphincter 1/5  3/5 weak hug 3/5 weak hug 3/5  Puborectalis 1/5  3/5 weak hug 3/5 weak hug 3/5  Diastasis Recti 1/5  N/a    (Blank rows = not tested)        TONE: increased  PROLAPSE: none  TODAY'S TREATMENT:   06/27/23 BP after exercise was 120/82 Neuromuscular re-education: Form correction: Lay on stomach with manual stretch of hip flexors then have patient work through the range of motion for muscle memory Supine with abdominal contraction alternate hip flexion and verbal cues to contract the abdomen Exercises: Strengthening: Prone hip extension with core engaged 20 x each Lay on side hip abduction 15 x bil. Patient able to go through the range with greater ease Bridges 20 x 2 Leaning on counter lifting opposite arm and leg with core engaged 30 x Step up on 3 level step with core and gluteal engagement      06/20/23 Manual: Soft tissue mobilization: Manual work to right quadriceps, hip adductors, and hip flexor  Neuromuscular re-education: Form correction: Laying on side with active assistive hip abduction with core engagement for patient to learn how to work through the full range then hold at end range Bridge to work through the full range and feel the gluteal squeeze Lay on side with manual stretch of hip flexors then have patient work through the range of motion for muscle memory Step up on 3 level step with core and gluteal engagement  Leaning on counter lifting opposite arm and leg with core engaged  06/15/23 Manual: Internal pelvic floor techniques: No emotional/communication barriers or cognitive limitation. Patient is motivated to learn. Patient understands and agrees with treatment goals and  plan. PT explains patient will be examined in standing, sitting, and lying down to see how their muscles and joints work. When they are ready, they will be asked to remove their underwear so PT can examine their perineum. The patient is also given the option of providing their own chaperone as one is not provided in our facility. The patient also has the right and is explained the right to defer or refuse any part of the evaluation or treatment including the internal exam. With the patient's  consent, PT will use one gloved finger to gently assess the muscles of the pelvic floor, seeing how well it contracts and relaxes and if there is muscle symmetry. After, the patient will get dressed and PT and patient will discuss exam findings and plan of care. PT and patient discuss plan of care, schedule, attendance policy and HEP activities.  Going through the rectum with a gloved finger working on the puborectalis, along the iliococcygeus, coccygeus with one finger internal and other external External manual work to the perineal body going through the restrictions Neuromuscular re-education: Pelvic floor contraction training: Therapist finger in the rectum and patients hand on the coccyx feeling the coccyx move with contraction, verbal cues to contract the rectum with breathing out so she is not bearing down, contracting the lower abdominals, tactile cues to the puborectalis to come forward, have the patient resist the therapist finger as it is being pulled out of the rectum      PATIENT EDUCATION:  06/01/23 Education details: Therapist educated patient on bladder irritants, how to fill out bladder diary, and urge to voidAccess Code: [email or web address] Person educated: Patient Education method: Explanation, Demonstration, Tactile cues, Verbal cues, and Handouts Education comprehension: verbalized understanding, returned demonstration, verbal cues required, tactile cues required, and needs further education  HOME EXERCISE PROGRAM: 06/01/23 Access Code: [email or web address] URL: [email or web address] Date: 06/01/2023 Prepared by: Marsha Skeen  Exercises - Supine Diaphragmatic Breathing  - 2 x daily - 7 x weekly - 1 sets - 10 reps - Diaphragmatic Breathing in Child's Pose with Pelvic Floor Relaxation  - 1 x daily - 7 x weekly - 1 sets - 10 reps - Seated Piriformis Stretch with Trunk Bend  - 1 x daily - 7 x weekly - 1 sets - 2 reps - 30 sec hold - Diaphragmatic Breathing in Child's Pose with Pelvic  Floor Relaxation  - 1 x daily - 7 x weekly - 1 sets - 1 reps - 1 min hold - Cat Cow  - 1 x daily - 7 x weekly - 1 sets - 10 reps - Child's Pose with Thread the Needle  - 1 x daily - 7 x weekly - 1 sets - 4 reps - Squat  - 1 x daily - 3 x weekly - 1 sets - 10 reps - Half Deadlift with Kettlebell  - 1 x daily - 3 x weekly - 1 sets - 10 reps - Hooklying Isometric Hip Flexion  - 1 x daily - 3 x weekly - 1 sets - 10 reps - Supine Bridge  - 1 x daily - 3 x weekly - 1 sets - 15 reps - Supine Alternating Shoulder Flexion  - 1 x daily - 3 x weekly - 2 sets - 10 reps - Supine Alternating Arm Punches on Foam Roll  - 1 x daily - 3 x weekly - 2 sets - 10 reps - Hooklying Clamshell with Resistance  - 1 x daily - 3 x weekly - 2  sets - 10 reps - Supine March with Resistance Band  - 1 x daily - 3 x weekly - 2 sets - 10 reps  ASSESSMENT:  CLINICAL IMPRESSION: Patient is a 68 y.o. female who was seen today for physical therapy  treatment for overactive bladder, levator spasm, and fecal incontinence. Patient had s/p Robotic assisted sacrocolpopexy, right salpingo- oophorectomy, cystoscopy on 11/08/22.  Patient had stool leakage without feeling it yesterday for the first time in a week. She has a little urine on her pads due to smelling it. She is still comfortable to go out in public and drink water  without the fear of leakage. She was very hot and a little dizzy at the end of treatment but her blood pressure was fine. Patient will benefit from skilled therapy to improve pelvic floor coordination and strength to reduce leakage.   OBJECTIVE IMPAIRMENTS: decreased coordination, decreased ROM, decreased strength, increased fascial restrictions, and increased muscle spasms.   ACTIVITY LIMITATIONS: continence, toileting, and locomotion level  PARTICIPATION LIMITATIONS: driving, shopping, and community activity  PERSONAL FACTORS: Fitness and 1-2 comorbidities: s/p Robotic assisted sacrocolpopexy, right salpingo-  oophorectomy, cystoscopy on 11/08/22 ; Appendectomy 1986; vaginal prolapse repair with hysterectomy 1986;  Left knee replacement are also affecting patient's functional outcome.   REHAB POTENTIAL: Excellent  CLINICAL DECISION MAKING: Evolving/moderate complexity  EVALUATION COMPLEXITY: Moderate   GOALS: Goals reviewed with patient? Yes  SHORT TERM GOALS: Target date: 06/02/23  Patient educated and understands what bladder irritants are and how they affect the bladder.  Baseline: Goal status: Met 04/11/23  2.  Patient educated on urge to void to reduce the urgency.  Baseline:  Goal status: Met 04/11/23  3.  Patient is able to perform diaphragmatic breathing to relax the pelvic floor.  Baseline:  Goal status: Met 04/18/23   LONG TERM GOALS: Target date: 10/20/23  Patient independent with advanced HEP for core, pelvic floor and hip strength.  Baseline:  Goal status: Ongoing 06/20/23  2.  Patient is able to have the urge to void and walk to the bathroom without leaking urine due to increased in strength.  Baseline:  Goal status: met 06/08/23  3.  Patient is able to go out in public and not worry where the bathroom are due to her urgency decreased >/= 50%.  Baseline:  Goal status: ongoing 06/20/23  4.  Patient reports her fecal leakage has improved >/= 75% due to increased in pelvic floor strength >/= 3/5.  Baseline: Yesterday had leakage all day Goal status: ongoing 06/20/23   PLAN:  PT FREQUENCY: 1-2x/week  PT DURATION: 12 weeks  PLANNED INTERVENTIONS: 97110-Therapeutic exercises, 97530- Therapeutic activity, 97112- Neuromuscular re-education, 97535- Self Care, 63875- Manual therapy, [id number]- Electrical stimulation (unattended), [phone]- Electrical stimulation (manual), 97035- Ultrasound, Patient/Family education, Dry Needling, Joint mobilization, Spinal mobilization, Cryotherapy, Moist heat, and Biofeedback  PLAN FOR NEXT SESSION:   hip flexor stretch, core strength, rectal  strength  Marsha Skeen, PT 06/27/23 2:12 PM

## 2023-07-04 ENCOUNTER — Encounter: Admitting: Physical Therapy

## 2023-07-04 ENCOUNTER — Encounter: Payer: Self-pay | Admitting: Physical Therapy

## 2023-07-04 DIAGNOSIS — R278 Other lack of coordination: Secondary | ICD-10-CM

## 2023-07-04 DIAGNOSIS — M6281 Muscle weakness (generalized): Secondary | ICD-10-CM | POA: Diagnosis not present

## 2023-07-04 NOTE — Therapy (Signed)
 OUTPATIENT PHYSICAL THERAPY FEMALE PELVIC TREATMENT   Patient Name: Olivia Paul MRN: [id number] DOB:[birth year], 68 y.o., female Today's Date: 07/04/2023     END OF SESSION:  PT End of Session - 07/04/23 1033     Visit Number 11    Date for PT Re-Evaluation 10/20/23    Authorization Type [employer] medicare    Authorization Time Period 06/21/2023-08/02/2023    Authorization - Visit Number 1    Authorization - Number of Visits 6    PT Start Time 1030    PT Stop Time 1115    PT Time Calculation (min) 45 min    Activity Tolerance Patient tolerated treatment well    Behavior During Therapy [facility] for tasks assessed/performed              Past Medical History:  Diagnosis Date   Allergy     Anxiety    Arthritis    lt knee   Asthma 2011   Complication of anesthesia    Fatty liver    GERD (gastroesophageal reflux disease)    Headache    sinus   History of COVID-19 10/11/2022   PONV (postoperative nausea and vomiting)    Pre-diabetes    Sleep apnea    cpap set on 4   Vitamin D  deficiency    Wears glasses    Past Surgical History:  Procedure Laterality Date   APPENDECTOMY  1986   BREAST BIOPSY     BREAST EXCISIONAL BIOPSY     CYSTOSCOPY N/A 11/08/2022   Procedure: CYSTOSCOPY;  Surgeon: Arma Lamp, MD;  Location: [facility];  Service: Gynecology;  Laterality: N/A;   KNEE ARTHROSCOPY WITH MENISCAL REPAIR Left 02/21/2018   LIPOMA EXCISION     ROBOTIC ASSISTED LAPAROSCOPIC SACROCOLPOPEXY N/A 11/08/2022   Procedure: XI ROBOTIC ASSISTED LAPAROSCOPIC SACROCOLPOPEXY;  Surgeon: Arma Lamp, MD;  Location: [facility];  Service: Gynecology;  Laterality: N/A;   TOTAL KNEE ARTHROPLASTY Left 11/16/2018   Procedure: TOTAL KNEE ARTHROPLASTY;  Surgeon: Neil Balls, MD;  Location: [facility];  Service: Orthopedics;  Laterality: Left;   total knee arthroplasty-revision Left 08/31/2020   Dr, Aquilla Knapp   TRIGGER FINGER RELEASE Right  12/25/2020   VAGINAL HYSTERECTOMY  1986   VAGINAL PROLAPSE REPAIR     1986   XI ROBOTIC ASSISTED OOPHORECTOMY Right 11/08/2022   Procedure: XI ROBOTIC ASSISTED OOPHORECTOMY;  Surgeon: Arma Lamp, MD;  Location: [facility];  Service: Gynecology;  Laterality: Right;   Patient Active Problem List   Diagnosis Date Noted   Encounter for general adult medical examination w/o abnormal findings 05/16/2023   Elevated blood pressure reading 05/16/2023   Vaginal vault prolapse after hysterectomy 11/08/2022   COVID-19 10/18/2022   Cough in adult 10/18/2022   Pure hypercholesterolemia 05/22/2022   Prediabetes 05/22/2022   OSA on CPAP 05/22/2022   Mood disorder (HCC) 12/27/2021   Urinary retention 09/02/2021   Menopause 09/02/2021   OAB (overactive bladder) 09/02/2021   Daytime sleepiness 07/02/2021   Insomnia 07/02/2021   Episodic tension-type headache, not intractable 03/31/2021   Lumbar radiculopathy 01/29/2021   Acquired trigger finger of right middle finger 11/26/2020   Impingement syndrome of right shoulder region 10/22/2020   Cervical radiculopathy 10/16/2020   Dizziness 08/02/2020   Class 3 severe obesity due to excess calories with serious comorbidity and body mass index (BMI) of 40.0 to 44.9 in adult 08/02/2020   Family history of heart disease 08/02/2020   Primary osteoarthritis of  left knee 11/16/2018   Unilateral primary osteoarthritis, left knee 10/29/2018   Xanthelasma of right eyelid 03/26/2018   Anxiety 03/26/2018   Perennial allergic rhinitis 02/02/2018   Vitamin D  deficiency 07/27/2017   Moderate persistent asthma, uncomplicated 05/11/2017   Drug allergy  05/11/2017   Basal cell papilloma 03/20/2015   Changing skin lesion 01/13/2015   Asthma 11/05/2014   Allergic rhinoconjunctivitis 11/05/2014    PCP: Cleave Curling, MD  REFERRING PROVIDER: Arma Lamp, MD   REFERRING DIAG:  N32.81 (ICD-10-CM) - OAB (overactive bladder)   [phone] (ICD-10-CM) - Levator spasm  R15.9 (ICD-10-CM) - Incontinence of feces, unspecified fecal incontinence type    THERAPY DIAG:  Muscle weakness (generalized)  Other lack of coordination  Rationale for Evaluation and Treatment: Rehabilitation  ONSET DATE: 1983  SUBJECTIVE:                                                                                                                                                                                           SUBJECTIVE STATEMENT: I have not had stool leakage since last visit. I am making it to the bathroom without leaking urine. When I go out I will use the bathroom 1 time instead of multiple times.      PAIN:  Are you having pain? No   PRECAUTIONS: None  RED FLAGS: None   WEIGHT BEARING RESTRICTIONS: No  FALLS:  Has patient fallen in last 6 months? Yes. Number of falls 1 time and slipped on ice not due to balance.  OCCUPATION: retired  ACTIVITY LEVEL : water  aerobics  PLOF: Independent  PATIENT GOALS: reduce urinary leakage, not know where every bathroom is in the store  PERTINENT HISTORY:  s/p Robotic assisted sacrocolpopexy, right salpingo- oophorectomy, cystoscopy on 11/08/22 ; Appendectomy 1986; vaginal prolapse repair with hysterectomy 1986;  Left knee replacement Sexual abuse: No  BOWEL MOVEMENT: Pain with bowel movement: No Type of bowel movement:Type (Bristol Stool Scale) Type 4 and Frequency 1-2 days Fully empty rectum: Yes:   Leakage: Yes: does not always realize she had stool leakage but has not had it in the last several weeks Fiber supplement/laxative Yes  and Metamucil  URINATION: Pain with urination: No Fully empty bladder: Yes:   Stream: Strong Urgency: Yes leaks when pulling her pants down Frequency: average Leakage: Urge to void, Walking to the bathroom, and during the night Pads: Yes: wears a pad when goes out for activity  INTERCOURSE: no activity  Pain with intercourse:  none  PREGNANCY: Vaginal deliveries 2 Tearing No Episiotomy Yes   PROLAPSE: None   OBJECTIVE:  Note: Objective measures were completed at Evaluation unless  otherwise noted.  DIAGNOSTIC FINDINGS:  none  PATIENT SURVEYS:  PFIQ-7: 41; UIQ-7 43; CRAIQ-7 43 06/19/24: PFIQ-7: 61; UIQ-7 33; CRAIQ-7 29  COGNITION: Overall cognitive status: Within functional limits for tasks assessed     SENSATION: Light touch: Appears intact   POSTURE: rounded shoulders, forward head, and decreased lumbar lordosis   LUMBARAROM/PROM: Lumbar ROM decreased by 25%   LOWER EXTREMITY ROM:  Passive ROM Right eval Left eval Left 06/20/23  Hip external rotation 80 50 70   (Blank rows = not tested)  LOWER EXTREMITY MMT:  MMT Right eval Left eval Right 06/20/23 Left 06/20/23  Hip flexion 4/5 4/5 4+/5 4+/5  Hip extension 3/5 3/5 3+/5 4/5  Hip abduction 3-/5 3/5 3+/5 3+/5   (Blank rows = not tested) PALPATION:    Abdominal: Decreased movement of the lower rib cage, tightness in the diaphragm                External Perineal Exam: intact but dry                             Internal Pelvic Floor: tightness throughout the pelvic floor  Patient confirms identification and approves PT to assess internal pelvic floor and treatment Yes  PELVIC MMT:   MMT eval 04/18/23 05/25/23 06/08/23 06/15/23 07/04/23  Vaginal 1/5 2/5    2/5 for the second and third layer, 1/5 for the first layer  Internal Anal Sphincter 1/5  3/5 weak hug 3/5 weak hug 3/5   External Anal Sphincter 1/5  3/5 weak hug 3/5 weak hug 3/5   Puborectalis 1/5  3/5 weak hug 3/5 weak hug 3/5   Diastasis Recti 1/5  N/a     (Blank rows = not tested)        TONE: increased  PROLAPSE: none  TODAY'S TREATMENT:  07/04/23 Manual: Internal pelvic floor techniques: No emotional/communication barriers or cognitive limitation. Patient is motivated to learn. Patient understands and agrees with treatment goals and plan. PT explains patient will be  examined in standing, sitting, and lying down to see how their muscles and joints work. When they are ready, they will be asked to remove their underwear so PT can examine their perineum. The patient is also given the option of providing their own chaperone as one is not provided in our facility. The patient also has the right and is explained the right to defer or refuse any part of the evaluation or treatment including the internal exam. With the patient's consent, PT will use one gloved finger to gently assess the muscles of the pelvic floor, seeing how well it contracts and relaxes and if there is muscle symmetry. After, the patient will get dressed and PT and patient will discuss exam findings and plan of care. PT and patient discuss plan of care, schedule, attendance policy and HEP activities.  Therapist gloved finger in the vaginal canal working on the urogenital diaphragm, along the perineal body, along the pubovaginalis and puborectalis Neuromuscular re-education: Pelvic floor contraction training: Therapist gloved finger in the vaginal canal working on taping the muscles to contract and assisting them upward. Verbal cues to not contract the gluteal Exercises: Strengthening: Prone hip extension with core engaged 30 x each Bridges 20 x 2 Lay on side hip abduction 15 x bil. Patient able to go through the range with greater ease Step up on 3 level step with core and gluteal engagement with verbal cues to not lean  on counter and to go slow    06/27/23 BP after exercise was 120/82 Neuromuscular re-education: Form correction: Lay on stomach with manual stretch of hip flexors then have patient work through the range of motion for muscle memory Supine with abdominal contraction alternate hip flexion and verbal cues to contract the abdomen Exercises: Strengthening: Prone hip extension with core engaged 20 x each Lay on side hip abduction 15 x bil. Patient able to go through the range with  greater ease Bridges 20 x 2 Leaning on counter lifting opposite arm and leg with core engaged 30 x Step up on 3 level step with core and gluteal engagement      06/20/23 Manual: Soft tissue mobilization: Manual work to right quadriceps, hip adductors, and hip flexor  Neuromuscular re-education: Form correction: Laying on side with active assistive hip abduction with core engagement for patient to learn how to work through the full range then hold at end range Bridge to work through the full range and feel the gluteal squeeze Lay on side with manual stretch of hip flexors then have patient work through the range of motion for muscle memory Step up on 3 level step with core and gluteal engagement  Leaning on counter lifting opposite arm and leg with core engaged   PATIENT EDUCATION:  06/01/23 Education details: Therapist educated patient on bladder irritants, how to fill out bladder diary, and urge to voidAccess Code: [email or web address] Person educated: Patient Education method: Explanation, Demonstration, Tactile cues, Verbal cues, and Handouts Education comprehension: verbalized understanding, returned demonstration, verbal cues required, tactile cues required, and needs further education  HOME EXERCISE PROGRAM: 06/01/23 Access Code: [email or web address] URL: [email or web address] Date: 06/01/2023 Prepared by: Marsha Skeen  Exercises - Supine Diaphragmatic Breathing  - 2 x daily - 7 x weekly - 1 sets - 10 reps - Diaphragmatic Breathing in Child's Pose with Pelvic Floor Relaxation  - 1 x daily - 7 x weekly - 1 sets - 10 reps - Seated Piriformis Stretch with Trunk Bend  - 1 x daily - 7 x weekly - 1 sets - 2 reps - 30 sec hold - Diaphragmatic Breathing in Child's Pose with Pelvic Floor Relaxation  - 1 x daily - 7 x weekly - 1 sets - 1 reps - 1 min hold - Cat Cow  - 1 x daily - 7 x weekly - 1 sets - 10 reps - Child's Pose with Thread the Needle  - 1 x daily - 7 x weekly - 1 sets - 4 reps -  Squat  - 1 x daily - 3 x weekly - 1 sets - 10 reps - Half Deadlift with Kettlebell  - 1 x daily - 3 x weekly - 1 sets - 10 reps - Hooklying Isometric Hip Flexion  - 1 x daily - 3 x weekly - 1 sets - 10 reps - Supine Bridge  - 1 x daily - 3 x weekly - 1 sets - 15 reps - Supine Alternating Shoulder Flexion  - 1 x daily - 3 x weekly - 2 sets - 10 reps - Supine Alternating Arm Punches on Foam Roll  - 1 x daily - 3 x weekly - 2 sets - 10 reps - Hooklying Clamshell with Resistance  - 1 x daily - 3 x weekly - 2 sets - 10 reps - Supine March with Resistance Band  - 1 x daily - 3 x weekly - 2 sets - 10 reps  ASSESSMENT:  CLINICAL IMPRESSION: Patient is a  68 y.o. female who was seen today for physical therapy  treatment for overactive bladder, levator spasm, and fecal incontinence. Patient had s/p Robotic assisted sacrocolpopexy, right salpingo- oophorectomy, cystoscopy on 11/08/22.  Patient has not had stool leakage since last visit. She is able to go out out and urinate 1 times instead of multiple times. She is able to make it to the bathroom. Pelvic floor strength is 2/5 for layer 2 and 3 and 1/5 for layer 1. She has tightness along the introitus and perineal body. She gets tired at end of treatment.  Patient will benefit from skilled therapy to improve pelvic floor coordination and strength to reduce leakage.   OBJECTIVE IMPAIRMENTS: decreased coordination, decreased ROM, decreased strength, increased fascial restrictions, and increased muscle spasms.   ACTIVITY LIMITATIONS: continence, toileting, and locomotion level  PARTICIPATION LIMITATIONS: driving, shopping, and community activity  PERSONAL FACTORS: Fitness and 1-2 comorbidities: s/p Robotic assisted sacrocolpopexy, right salpingo- oophorectomy, cystoscopy on 11/08/22 ; Appendectomy 1986; vaginal prolapse repair with hysterectomy 1986;  Left knee replacement are also affecting patient's functional outcome.   REHAB POTENTIAL: Excellent  CLINICAL  DECISION MAKING: Evolving/moderate complexity  EVALUATION COMPLEXITY: Moderate   GOALS: Goals reviewed with patient? Yes  SHORT TERM GOALS: Target date: 06/02/23  Patient educated and understands what bladder irritants are and how they affect the bladder.  Baseline: Goal status: Met 04/11/23  2.  Patient educated on urge to void to reduce the urgency.  Baseline:  Goal status: Met 04/11/23  3.  Patient is able to perform diaphragmatic breathing to relax the pelvic floor.  Baseline:  Goal status: Met 04/18/23   LONG TERM GOALS: Target date: 10/20/23  Patient independent with advanced HEP for core, pelvic floor and hip strength.  Baseline:  Goal status: Ongoing 06/20/23  2.  Patient is able to have the urge to void and walk to the bathroom without leaking urine due to increased in strength.  Baseline:  Goal status: met 06/08/23  3.  Patient is able to go out in public and not worry where the bathroom are due to her urgency decreased >/= 50%.  Baseline:  Goal status: ongoing 06/20/23  4.  Patient reports her fecal leakage has improved >/= 75% due to increased in pelvic floor strength >/= 3/5.  Baseline: Yesterday had leakage all day Goal status: ongoing 06/20/23   PLAN:  PT FREQUENCY: 1-2x/week  PT DURATION: 12 weeks  PLANNED INTERVENTIONS: 97110-Therapeutic exercises, 97530- Therapeutic activity, 97112- Neuromuscular re-education, 97535- Self Care, 16109- Manual therapy, [id number]- Electrical stimulation (unattended), [id number]- Electrical stimulation (manual), 97035- Ultrasound, Patient/Family education, Dry Needling, Joint mobilization, Spinal mobilization, Cryotherapy, Moist heat, and Biofeedback  PLAN FOR NEXT SESSION:   manual work to vaginal opening to work on stronger contraction, rectal strength  Marsha Skeen, PT 07/04/23 11:18 AM

## 2023-07-13 ENCOUNTER — Encounter: Admitting: Physical Therapy

## 2023-07-13 ENCOUNTER — Encounter: Payer: Self-pay | Admitting: Physical Therapy

## 2023-07-13 DIAGNOSIS — R278 Other lack of coordination: Secondary | ICD-10-CM

## 2023-07-13 DIAGNOSIS — M6281 Muscle weakness (generalized): Secondary | ICD-10-CM | POA: Diagnosis not present

## 2023-07-13 NOTE — Therapy (Signed)
 OUTPATIENT PHYSICAL THERAPY FEMALE PELVIC TREATMENT   Patient Name: Olivia Paul MRN: [id number] DOB:09-Dec-1955, 68 y.o., female Today's Date: 07/13/2023     END OF SESSION:  PT End of Session - 07/13/23 1311     Visit Number 12    Date for PT Re-Evaluation 10/20/23    Authorization Type [employer] medicare    Authorization Time Period 06/21/2023-08/02/2023    Authorization - Visit Number 2    Authorization - Number of Visits 6    Progress Note Due on Visit 10    PT Start Time 1300    PT Stop Time 1345    PT Time Calculation (min) 45 min              Past Medical History:  Diagnosis Date   Allergy     Anxiety    Arthritis    lt knee   Asthma 2011   Complication of anesthesia    Fatty liver    GERD (gastroesophageal reflux disease)    Headache    sinus   History of COVID-19 10/11/2022   PONV (postoperative nausea and vomiting)    Pre-diabetes    Sleep apnea    cpap set on 4   Vitamin D  deficiency    Wears glasses    Past Surgical History:  Procedure Laterality Date   APPENDECTOMY  1986   BREAST BIOPSY     BREAST EXCISIONAL BIOPSY     CYSTOSCOPY N/A 11/08/2022   Procedure: CYSTOSCOPY;  Surgeon: Arma Lamp, MD;  Location: [facility];  Service: Gynecology;  Laterality: N/A;   KNEE ARTHROSCOPY WITH MENISCAL REPAIR Left 02/21/2018   LIPOMA EXCISION     ROBOTIC ASSISTED LAPAROSCOPIC SACROCOLPOPEXY N/A 11/08/2022   Procedure: XI ROBOTIC ASSISTED LAPAROSCOPIC SACROCOLPOPEXY;  Surgeon: Arma Lamp, MD;  Location: [facility];  Service: Gynecology;  Laterality: N/A;   TOTAL KNEE ARTHROPLASTY Left 11/16/2018   Procedure: TOTAL KNEE ARTHROPLASTY;  Surgeon: Neil Balls, MD;  Location: [facility];  Service: Orthopedics;  Laterality: Left;   total knee arthroplasty-revision Left 08/31/2020   Dr, Aquilla Knapp   TRIGGER FINGER RELEASE Right 12/25/2020   VAGINAL HYSTERECTOMY  1986   VAGINAL PROLAPSE REPAIR     1986   XI  ROBOTIC ASSISTED OOPHORECTOMY Right 11/08/2022   Procedure: XI ROBOTIC ASSISTED OOPHORECTOMY;  Surgeon: Arma Lamp, MD;  Location: [facility];  Service: Gynecology;  Laterality: Right;   Patient Active Problem List   Diagnosis Date Noted   Encounter for general adult medical examination w/o abnormal findings 05/16/2023   Elevated blood pressure reading 05/16/2023   Vaginal vault prolapse after hysterectomy 11/08/2022   COVID-19 10/18/2022   Cough in adult 10/18/2022   Pure hypercholesterolemia 05/22/2022   Prediabetes 05/22/2022   OSA on CPAP 05/22/2022   Mood disorder (HCC) 12/27/2021   Urinary retention 09/02/2021   Menopause 09/02/2021   OAB (overactive bladder) 09/02/2021   Daytime sleepiness 07/02/2021   Insomnia 07/02/2021   Episodic tension-type headache, not intractable 03/31/2021   Lumbar radiculopathy 01/29/2021   Acquired trigger finger of right middle finger 11/26/2020   Impingement syndrome of right shoulder region 10/22/2020   Cervical radiculopathy 10/16/2020   Dizziness 08/02/2020   Class 3 severe obesity due to excess calories with serious comorbidity and body mass index (BMI) of 40.0 to 44.9 in adult 08/02/2020   Family history of heart disease 08/02/2020   Primary osteoarthritis of left knee 11/16/2018   Unilateral primary osteoarthritis, left knee  10/29/2018   Xanthelasma of right eyelid 03/26/2018   Anxiety 03/26/2018   Perennial allergic rhinitis 02/02/2018   Vitamin D  deficiency 07/27/2017   Moderate persistent asthma, uncomplicated 05/11/2017   Drug allergy  05/11/2017   Basal cell papilloma 03/20/2015   Changing skin lesion 01/13/2015   Asthma 11/05/2014   Allergic rhinoconjunctivitis 11/05/2014    PCP: Cleave Curling, MD  REFERRING PROVIDER: Arma Lamp, MD   REFERRING DIAG:  N32.81 (ICD-10-CM) - OAB (overactive bladder)  [phone] (ICD-10-CM) - Levator spasm  R15.9 (ICD-10-CM) - Incontinence of feces,  unspecified fecal incontinence type    THERAPY DIAG:  Muscle weakness (generalized)  Other lack of coordination  Rationale for Evaluation and Treatment: Rehabilitation  ONSET DATE: 1983  SUBJECTIVE:                                                                                                                                                                                           SUBJECTIVE STATEMENT: Patient will wear a pad when she is having an issue with control. I can go to the store without needing to go to the bathroom.     PAIN:  Are you having pain? No   PRECAUTIONS: None  RED FLAGS: None   WEIGHT BEARING RESTRICTIONS: No  FALLS:  Has patient fallen in last 6 months? Yes. Number of falls 1 time and slipped on ice not due to balance.  OCCUPATION: retired  ACTIVITY LEVEL : water  aerobics  PLOF: Independent  PATIENT GOALS: reduce urinary leakage, not know where every bathroom is in the store  PERTINENT HISTORY:  s/p Robotic assisted sacrocolpopexy, right salpingo- oophorectomy, cystoscopy on 11/08/22 ; Appendectomy 1986; vaginal prolapse repair with hysterectomy 1986;  Left knee replacement Sexual abuse: No  BOWEL MOVEMENT: Pain with bowel movement: No Type of bowel movement:Type (Bristol Stool Scale) Type 4 and Frequency 1-2 days Fully empty rectum: Yes:   Leakage: Yes: does not always realize she had stool leakage but has not had it in the last several weeks Fiber supplement/laxative Yes  and Metamucil  URINATION: Pain with urination: No Fully empty bladder: Yes:   Stream: Strong Urgency: Yes leaks when pulling her pants down Frequency: average Leakage: Urge to void, Walking to the bathroom, and during the night Pads: Yes: wears a pad when goes out for activity  INTERCOURSE: no activity  Pain with intercourse: none  PREGNANCY: Vaginal deliveries 2 Tearing No Episiotomy Yes   PROLAPSE: None   OBJECTIVE:  Note: Objective measures were  completed at Evaluation unless otherwise noted.  DIAGNOSTIC FINDINGS:  none  PATIENT SURVEYS:  PFIQ-7: 82; UIQ-7 43; CRAIQ-7 43 06/19/24: PFIQ-7:  61; UIQ-7 33; CRAIQ-7 29 07/13/23: PFIQ-7: 57; UIQ-7 29; CRAIQ-7 33  COGNITION: Overall cognitive status: Within functional limits for tasks assessed     SENSATION: Light touch: Appears intact   POSTURE: rounded shoulders, forward head, and decreased lumbar lordosis   LUMBARAROM/PROM: Lumbar ROM decreased by 25%   LOWER EXTREMITY ROM:  Passive ROM Right eval Left eval Left 06/20/23  Hip external rotation 80 50 70   (Blank rows = not tested)  LOWER EXTREMITY MMT:  MMT Right eval Left eval Right 06/20/23 Left 06/20/23  Hip flexion 4/5 4/5 4+/5 4+/5  Hip extension 3/5 3/5 3+/5 4/5  Hip abduction 3-/5 3/5 3+/5 3+/5   (Blank rows = not tested) PALPATION:    Abdominal: Decreased movement of the lower rib cage, tightness in the diaphragm                External Perineal Exam: intact but dry                             Internal Pelvic Floor: tightness throughout the pelvic floor  Patient confirms identification and approves PT to assess internal pelvic floor and treatment Yes  PELVIC MMT:   MMT eval 04/18/23 05/25/23 06/08/23 06/15/23 07/04/23  Vaginal 1/5 2/5    2/5 for the second and third layer, 1/5 for the first layer  Internal Anal Sphincter 1/5  3/5 weak hug 3/5 weak hug 3/5   External Anal Sphincter 1/5  3/5 weak hug 3/5 weak hug 3/5   Puborectalis 1/5  3/5 weak hug 3/5 weak hug 3/5   Diastasis Recti 1/5  N/a     (Blank rows = not tested)        TONE: increased  PROLAPSE: none  TODAY'S TREATMENT:  07/13/23 Exercises: Stretches/mobility: Cat cow 15 x then hip wiggle Strengthening: Supine abdominal contraction with flexing her knees 10 x each leg Supine elbow press with extending her spine and engage the core with shoulders downward Supine bridges 10 x Supine hip in and out 10 x Supine hip flexion with band around  knees and alternate shoulder flexion Quadruped alternate shoulder and hip lift with verbal cues to go slowly Self-care: Education on how to fill out a bladder diary to see reasons for urgency Education on stop drinking liquid at 9:00 PM and only water  for dinner to reduce the number of times she needs to urinate at night Education on deterring the need to urinate while getting to therapy and leaving and at a doctors office due to her bladder not full enogh    07/04/23 Manual: Internal pelvic floor techniques: No emotional/communication barriers or cognitive limitation. Patient is motivated to learn. Patient understands and agrees with treatment goals and plan. PT explains patient will be examined in standing, sitting, and lying down to see how their muscles and joints work. When they are ready, they will be asked to remove their underwear so PT can examine their perineum. The patient is also given the option of providing their own chaperone as one is not provided in our facility. The patient also has the right and is explained the right to defer or refuse any part of the evaluation or treatment including the internal exam. With the patient's consent, PT will use one gloved finger to gently assess the muscles of the pelvic floor, seeing how well it contracts and relaxes and if there is muscle symmetry. After, the patient will get dressed and PT  and patient will discuss exam findings and plan of care. PT and patient discuss plan of care, schedule, attendance policy and HEP activities.  Therapist gloved finger in the vaginal canal working on the urogenital diaphragm, along the perineal body, along the pubovaginalis and puborectalis Neuromuscular re-education: Pelvic floor contraction training: Therapist gloved finger in the vaginal canal working on taping the muscles to contract and assisting them upward. Verbal cues to not contract the gluteal Exercises: Strengthening: Prone hip extension with core  engaged 30 x each Bridges 20 x 2 Lay on side hip abduction 15 x bil. Patient able to go through the range with greater ease Step up on 3 level step with core and gluteal engagement with verbal cues to not lean on counter and to go slow    06/27/23 BP after exercise was 120/82 Neuromuscular re-education: Form correction: Lay on stomach with manual stretch of hip flexors then have patient work through the range of motion for muscle memory Supine with abdominal contraction alternate hip flexion and verbal cues to contract the abdomen Exercises: Strengthening: Prone hip extension with core engaged 20 x each Lay on side hip abduction 15 x bil. Patient able to go through the range with greater ease Bridges 20 x 2 Leaning on counter lifting opposite arm and leg with core engaged 30 x Step up on 3 level step with core and gluteal engagement      06/20/23 Manual: Soft tissue mobilization: Manual work to right quadriceps, hip adductors, and hip flexor  Neuromuscular re-education: Form correction: Laying on side with active assistive hip abduction with core engagement for patient to learn how to work through the full range then hold at end range Bridge to work through the full range and feel the gluteal squeeze Lay on side with manual stretch of hip flexors then have patient work through the range of motion for muscle memory Step up on 3 level step with core and gluteal engagement  Leaning on counter lifting opposite arm and leg with core engaged   PATIENT EDUCATION:  06/01/23 Education details: Therapist educated patient on bladder irritants, how to fill out bladder diary, and urge to voidAccess Code: [email or web address] Person educated: Patient Education method: Explanation, Demonstration, Tactile cues, Verbal cues, and Handouts Education comprehension: verbalized understanding, returned demonstration, verbal cues required, tactile cues required, and needs further education  HOME EXERCISE  PROGRAM: 06/01/23 Access Code: [email or web address] URL: [email or web address] Date: 06/01/2023 Prepared by: Marsha Skeen  Exercises - Supine Diaphragmatic Breathing  - 2 x daily - 7 x weekly - 1 sets - 10 reps - Diaphragmatic Breathing in Child's Pose with Pelvic Floor Relaxation  - 1 x daily - 7 x weekly - 1 sets - 10 reps - Seated Piriformis Stretch with Trunk Bend  - 1 x daily - 7 x weekly - 1 sets - 2 reps - 30 sec hold - Diaphragmatic Breathing in Child's Pose with Pelvic Floor Relaxation  - 1 x daily - 7 x weekly - 1 sets - 1 reps - 1 min hold - Cat Cow  - 1 x daily - 7 x weekly - 1 sets - 10 reps - Child's Pose with Thread the Needle  - 1 x daily - 7 x weekly - 1 sets - 4 reps - Squat  - 1 x daily - 3 x weekly - 1 sets - 10 reps - Half Deadlift with Kettlebell  - 1 x daily - 3 x weekly - 1 sets - 10 reps - Hooklying Isometric Hip  Flexion  - 1 x daily - 3 x weekly - 1 sets - 10 reps - Supine Bridge  - 1 x daily - 3 x weekly - 1 sets - 15 reps - Supine Alternating Shoulder Flexion  - 1 x daily - 3 x weekly - 2 sets - 10 reps - Supine Alternating Arm Punches on Foam Roll  - 1 x daily - 3 x weekly - 2 sets - 10 reps - Hooklying Clamshell with Resistance  - 1 x daily - 3 x weekly - 2 sets - 10 reps - Supine March with Resistance Band  - 1 x daily - 3 x weekly - 2 sets - 10 reps  ASSESSMENT:  CLINICAL IMPRESSION: Patient is a 68 y.o. female who was seen today for physical therapy  treatment for overactive bladder, levator spasm, and fecal incontinence. Patient had s/p Robotic assisted sacrocolpopexy, right salpingo- oophorectomy, cystoscopy on 11/08/22.  Patient has not had stool leakage since last  2 visits. She has had a day she is not wearing pads. She was able to come into the therapy office, exercise and leave without urinating even though she had the urge. She was able to exercise today without leaking urine.   Patient will benefit from skilled therapy to improve pelvic floor  coordination and strength to reduce leakage.   OBJECTIVE IMPAIRMENTS: decreased coordination, decreased ROM, decreased strength, increased fascial restrictions, and increased muscle spasms.   ACTIVITY LIMITATIONS: continence, toileting, and locomotion level  PARTICIPATION LIMITATIONS: driving, shopping, and community activity  PERSONAL FACTORS: Fitness and 1-2 comorbidities: s/p Robotic assisted sacrocolpopexy, right salpingo- oophorectomy, cystoscopy on 11/08/22 ; Appendectomy 1986; vaginal prolapse repair with hysterectomy 1986;  Left knee replacement are also affecting patient's functional outcome.   REHAB POTENTIAL: Excellent  CLINICAL DECISION MAKING: Evolving/moderate complexity  EVALUATION COMPLEXITY: Moderate   GOALS: Goals reviewed with patient? Yes  SHORT TERM GOALS: Target date: 06/02/23  Patient educated and understands what bladder irritants are and how they affect the bladder.  Baseline: Goal status: Met 04/11/23  2.  Patient educated on urge to void to reduce the urgency.  Baseline:  Goal status: Met 04/11/23  3.  Patient is able to perform diaphragmatic breathing to relax the pelvic floor.  Baseline:  Goal status: Met 04/18/23   LONG TERM GOALS: Target date: 10/20/23  Patient independent with advanced HEP for core, pelvic floor and hip strength.  Baseline:  Goal status: Ongoing 06/20/23  2.  Patient is able to have the urge to void and walk to the bathroom without leaking urine due to increased in strength.  Baseline:  Goal status: met 06/08/23  3.  Patient is able to go out in public and not worry where the bathroom are due to her urgency decreased >/= 50%.  Baseline:  Goal status: ongoing 06/20/23  4.  Patient reports her fecal leakage has improved >/= 75% due to increased in pelvic floor strength >/= 3/5.  Baseline: Yesterday had leakage all day Goal status: ongoing 06/20/23   PLAN:  PT FREQUENCY: 1-2x/week  PT DURATION: 12 weeks  PLANNED INTERVENTIONS:  97110-Therapeutic exercises, 97530- Therapeutic activity, 97112- Neuromuscular re-education, 97535- Self Care, [id number]- Manual therapy, [id number]- Electrical stimulation (unattended), [phone]- Electrical stimulation (manual), 97035- Ultrasound, Patient/Family education, Dry Needling, Joint mobilization, Spinal mobilization, Cryotherapy, Moist heat, and Biofeedback  PLAN FOR NEXT SESSION:   manual work to vaginal opening to work on stronger contraction, go over the bladder diary, update HEP; see if she is able to cut back  1 time with night time urination  Marsha Skeen, PT 07/13/23 3:19 PM

## 2023-07-18 ENCOUNTER — Encounter: Admitting: Physical Therapy

## 2023-07-20 ENCOUNTER — Encounter: Attending: Obstetrics and Gynecology | Admitting: Physical Therapy

## 2023-07-20 ENCOUNTER — Encounter: Payer: Self-pay | Admitting: Physical Therapy

## 2023-07-20 DIAGNOSIS — M6281 Muscle weakness (generalized): Secondary | ICD-10-CM | POA: Diagnosis present

## 2023-07-20 DIAGNOSIS — R278 Other lack of coordination: Secondary | ICD-10-CM

## 2023-07-20 NOTE — Therapy (Signed)
 OUTPATIENT PHYSICAL THERAPY FEMALE PELVIC TREATMENT   Patient Name: Olivia Paul MRN: [id number] DOB:1955/11/15, 68 y.o., female Today's Date: 07/20/2023     END OF SESSION:  PT End of Session - 07/20/23 1456     Visit Number 13    Date for PT Re-Evaluation 10/20/23    Authorization Type [employer] medicare    Authorization Time Period 06/21/2023-08/02/2023    Authorization - Visit Number 3    Authorization - Number of Visits 6    Progress Note Due on Visit 10    PT Start Time 1450    PT Stop Time 1540    PT Time Calculation (min) 50 min    Activity Tolerance Patient tolerated treatment well    Behavior During Therapy [facility] for tasks assessed/performed              Past Medical History:  Diagnosis Date   Allergy     Anxiety    Arthritis    lt knee   Asthma 2011   Complication of anesthesia    Fatty liver    GERD (gastroesophageal reflux disease)    Headache    sinus   History of COVID-19 10/11/2022   PONV (postoperative nausea and vomiting)    Pre-diabetes    Sleep apnea    cpap set on 4   Vitamin D  deficiency    Wears glasses    Past Surgical History:  Procedure Laterality Date   APPENDECTOMY  1986   BREAST BIOPSY     BREAST EXCISIONAL BIOPSY     CYSTOSCOPY N/A 11/08/2022   Procedure: CYSTOSCOPY;  Surgeon: Arma Lamp, MD;  Location: [facility];  Service: Gynecology;  Laterality: N/A;   KNEE ARTHROSCOPY WITH MENISCAL REPAIR Left 02/21/2018   LIPOMA EXCISION     ROBOTIC ASSISTED LAPAROSCOPIC SACROCOLPOPEXY N/A 11/08/2022   Procedure: XI ROBOTIC ASSISTED LAPAROSCOPIC SACROCOLPOPEXY;  Surgeon: Arma Lamp, MD;  Location: [facility];  Service: Gynecology;  Laterality: N/A;   TOTAL KNEE ARTHROPLASTY Left 11/16/2018   Procedure: TOTAL KNEE ARTHROPLASTY;  Surgeon: Neil Balls, MD;  Location: [facility];  Service: Orthopedics;  Laterality: Left;   total knee arthroplasty-revision Left 08/31/2020   Dr, Aquilla Knapp    TRIGGER FINGER RELEASE Right 12/25/2020   VAGINAL HYSTERECTOMY  1986   VAGINAL PROLAPSE REPAIR     1986   XI ROBOTIC ASSISTED OOPHORECTOMY Right 11/08/2022   Procedure: XI ROBOTIC ASSISTED OOPHORECTOMY;  Surgeon: Arma Lamp, MD;  Location: [facility];  Service: Gynecology;  Laterality: Right;   Patient Active Problem List   Diagnosis Date Noted   Encounter for general adult medical examination w/o abnormal findings 05/16/2023   Elevated blood pressure reading 05/16/2023   Vaginal vault prolapse after hysterectomy 11/08/2022   COVID-19 10/18/2022   Cough in adult 10/18/2022   Pure hypercholesterolemia 05/22/2022   Prediabetes 05/22/2022   OSA on CPAP 05/22/2022   Mood disorder (HCC) 12/27/2021   Urinary retention 09/02/2021   Menopause 09/02/2021   OAB (overactive bladder) 09/02/2021   Daytime sleepiness 07/02/2021   Insomnia 07/02/2021   Episodic tension-type headache, not intractable 03/31/2021   Lumbar radiculopathy 01/29/2021   Acquired trigger finger of right middle finger 11/26/2020   Impingement syndrome of right shoulder region 10/22/2020   Cervical radiculopathy 10/16/2020   Dizziness 08/02/2020   Class 3 severe obesity due to excess calories with serious comorbidity and body mass index (BMI) of 40.0 to 44.9 in adult 08/02/2020   Family history  of heart disease 08/02/2020   Primary osteoarthritis of left knee 11/16/2018   Unilateral primary osteoarthritis, left knee 10/29/2018   Xanthelasma of right eyelid 03/26/2018   Anxiety 03/26/2018   Perennial allergic rhinitis 02/02/2018   Vitamin D  deficiency 07/27/2017   Moderate persistent asthma, uncomplicated 05/11/2017   Drug allergy  05/11/2017   Basal cell papilloma 03/20/2015   Changing skin lesion 01/13/2015   Asthma 11/05/2014   Allergic rhinoconjunctivitis 11/05/2014    PCP: Cleave Curling, MD  REFERRING PROVIDER: Arma Lamp, MD   REFERRING DIAG:  N32.81 (ICD-10-CM) - OAB  (overactive bladder)  [phone] (ICD-10-CM) - Levator spasm  R15.9 (ICD-10-CM) - Incontinence of feces, unspecified fecal incontinence type    THERAPY DIAG:  Muscle weakness (generalized)  Other lack of coordination  Rationale for Evaluation and Treatment: Rehabilitation  ONSET DATE: 1983  SUBJECTIVE:                                                                                                                                                                                           SUBJECTIVE STATEMENT: I had one accident with urine last week.  Patient will wear a pad when she is having an issue with control. I can go to the store without needing to go to the bathroom.     PAIN:  Are you having pain? No   PRECAUTIONS: None  RED FLAGS: None   WEIGHT BEARING RESTRICTIONS: No  FALLS:  Has patient fallen in last 6 months? Yes. Number of falls 1 time and slipped on ice not due to balance.  OCCUPATION: retired  ACTIVITY LEVEL : water  aerobics  PLOF: Independent  PATIENT GOALS: reduce urinary leakage, not know where every bathroom is in the store  PERTINENT HISTORY:  s/p Robotic assisted sacrocolpopexy, right salpingo- oophorectomy, cystoscopy on 11/08/22 ; Appendectomy 1986; vaginal prolapse repair with hysterectomy 1986;  Left knee replacement Sexual abuse: No  BOWEL MOVEMENT: Pain with bowel movement: No Type of bowel movement:Type (Bristol Stool Scale) Type 4 and Frequency 1-2 days Fully empty rectum: Yes:   Leakage: Yes: does not always realize she had stool leakage but has not had it in the last several weeks Fiber supplement/laxative Yes  and Metamucil  URINATION: Pain with urination: No Fully empty bladder: Yes:   Stream: Strong Urgency: Yes leaks when pulling her pants down Frequency: average Leakage: Urge to void, Walking to the bathroom, and during the night Pads: Yes: wears a pad when goes out for activity  INTERCOURSE: no activity  Pain with  intercourse: none  PREGNANCY: Vaginal deliveries 2 Tearing No Episiotomy Yes   PROLAPSE: None   OBJECTIVE:  Note: Objective measures were completed at Evaluation unless otherwise noted.  DIAGNOSTIC FINDINGS:  none  PATIENT SURVEYS:  PFIQ-7: 82; UIQ-7 43; CRAIQ-7 43 06/19/24: PFIQ-7: 61; UIQ-7 33; CRAIQ-7 29 07/13/23: PFIQ-7: 57; UIQ-7 29; CRAIQ-7 33  COGNITION: Overall cognitive status: Within functional limits for tasks assessed     SENSATION: Light touch: Appears intact   POSTURE: rounded shoulders, forward head, and decreased lumbar lordosis   LUMBARAROM/PROM: Lumbar ROM decreased by 25%   LOWER EXTREMITY ROM:  Passive ROM Right eval Left eval Left 06/20/23  Hip external rotation 80 50 70   (Blank rows = not tested)  LOWER EXTREMITY MMT:  MMT Right eval Left eval Right 06/20/23 Left 06/20/23  Hip flexion 4/5 4/5 4+/5 4+/5  Hip extension 3/5 3/5 3+/5 4/5  Hip abduction 3-/5 3/5 3+/5 3+/5   (Blank rows = not tested) PALPATION:    Abdominal: Decreased movement of the lower rib cage, tightness in the diaphragm                External Perineal Exam: intact but dry                             Internal Pelvic Floor: tightness throughout the pelvic floor  Patient confirms identification and approves PT to assess internal pelvic floor and treatment Yes  PELVIC MMT:   MMT eval 04/18/23 05/25/23 06/08/23 06/15/23 07/04/23 07/20/23  Vaginal 1/5 2/5    2/5 for the second and third layer, 1/5 for the first layer 2/5 for all layers  Internal Anal Sphincter 1/5  3/5 weak hug 3/5 weak hug 3/5    External Anal Sphincter 1/5  3/5 weak hug 3/5 weak hug 3/5    Puborectalis 1/5  3/5 weak hug 3/5 weak hug 3/5    Diastasis Recti 1/5  N/a      (Blank rows = not tested)        TONE: increased  PROLAPSE: none  TODAY'S TREATMENT:  07/20/23 Manual: Internal pelvic floor techniques: No emotional/communication barriers or cognitive limitation. Patient is motivated to learn.  Patient understands and agrees with treatment goals and plan. PT explains patient will be examined in standing, sitting, and lying down to see how their muscles and joints work. When they are ready, they will be asked to remove their underwear so PT can examine their perineum. The patient is also given the option of providing their own chaperone as one is not provided in our facility. The patient also has the right and is explained the right to defer or refuse any part of the evaluation or treatment including the internal exam. With the patient's consent, PT will use one gloved finger to gently assess the muscles of the pelvic floor, seeing how well it contracts and relaxes and if there is muscle symmetry. After, the patient will get dressed and PT and patient will discuss exam findings and plan of care. PT and patient discuss plan of care, schedule, attendance policy and HEP activities.  Going through the vaginal canal with a gloved finger performing manual work to the introitus and levator ani to lengthen the tissue for contraction Dry needling: Neuromuscular re-education: Pelvic floor contraction training: Supine therapist gloved finger in the vaginal canal working on contraction with tactile cues and not bulging out Sitting pelvic floor contraction with leaning forward Sit to stand and back with pelvic floor contraction    07/13/23 Exercises: Stretches/mobility: Cat cow 15 x then hip  wiggle Strengthening: Supine abdominal contraction with flexing her knees 10 x each leg Supine elbow press with extending her spine and engage the core with shoulders downward Supine bridges 10 x Supine hip in and out 10 x Supine hip flexion with band around knees and alternate shoulder flexion Quadruped alternate shoulder and hip lift with verbal cues to go slowly Self-care: Education on how to fill out a bladder diary to see reasons for urgency Education on stop drinking liquid at 9:00 PM and only water  for  dinner to reduce the number of times she needs to urinate at night Education on deterring the need to urinate while getting to therapy and leaving and at a doctors office due to her bladder not full enogh    07/04/23 Manual: Internal pelvic floor techniques: No emotional/communication barriers or cognitive limitation. Patient is motivated to learn. Patient understands and agrees with treatment goals and plan. PT explains patient will be examined in standing, sitting, and lying down to see how their muscles and joints work. When they are ready, they will be asked to remove their underwear so PT can examine their perineum. The patient is also given the option of providing their own chaperone as one is not provided in our facility. The patient also has the right and is explained the right to defer or refuse any part of the evaluation or treatment including the internal exam. With the patient's consent, PT will use one gloved finger to gently assess the muscles of the pelvic floor, seeing how well it contracts and relaxes and if there is muscle symmetry. After, the patient will get dressed and PT and patient will discuss exam findings and plan of care. PT and patient discuss plan of care, schedule, attendance policy and HEP activities.  Therapist gloved finger in the vaginal canal working on the urogenital diaphragm, along the perineal body, along the pubovaginalis and puborectalis Neuromuscular re-education: Pelvic floor contraction training: Therapist gloved finger in the vaginal canal working on taping the muscles to contract and assisting them upward. Verbal cues to not contract the gluteal Exercises: Strengthening: Prone hip extension with core engaged 30 x each Bridges 20 x 2 Lay on side hip abduction 15 x bil. Patient able to go through the range with greater ease Step up on 3 level step with core and gluteal engagement with verbal cues to not lean on counter and to go slow    06/27/23 BP after  exercise was 120/82 Neuromuscular re-education: Form correction: Lay on stomach with manual stretch of hip flexors then have patient work through the range of motion for muscle memory Supine with abdominal contraction alternate hip flexion and verbal cues to contract the abdomen Exercises: Strengthening: Prone hip extension with core engaged 20 x each Lay on side hip abduction 15 x bil. Patient able to go through the range with greater ease Bridges 20 x 2 Leaning on counter lifting opposite arm and leg with core engaged 30 x Step up on 3 level step with core and gluteal engagement      06/20/23 Manual: Soft tissue mobilization: Manual work to right quadriceps, hip adductors, and hip flexor  Neuromuscular re-education: Form correction: Laying on side with active assistive hip abduction with core engagement for patient to learn how to work through the full range then hold at end range Bridge to work through the full range and feel the gluteal squeeze Lay on side with manual stretch of hip flexors then have patient work through the range of motion  for muscle memory Step up on 3 level step with core and gluteal engagement  Leaning on counter lifting opposite arm and leg with core engaged   PATIENT EDUCATION:  06/01/23 Education details: Therapist educated patient on bladder irritants, how to fill out bladder diary, and urge to voidAccess Code: [email or web address] Person educated: Patient Education method: Explanation, Demonstration, Tactile cues, Verbal cues, and Handouts Education comprehension: verbalized understanding, returned demonstration, verbal cues required, tactile cues required, and needs further education  HOME EXERCISE PROGRAM: 06/01/23 Access Code: [email or web address] URL: [email or web address] Date: 06/01/2023 Prepared by: Marsha Skeen  Exercises - Supine Diaphragmatic Breathing  - 2 x daily - 7 x weekly - 1 sets - 10 reps - Diaphragmatic Breathing in Child's Pose with Pelvic  Floor Relaxation  - 1 x daily - 7 x weekly - 1 sets - 10 reps - Seated Piriformis Stretch with Trunk Bend  - 1 x daily - 7 x weekly - 1 sets - 2 reps - 30 sec hold - Diaphragmatic Breathing in Child's Pose with Pelvic Floor Relaxation  - 1 x daily - 7 x weekly - 1 sets - 1 reps - 1 min hold - Cat Cow  - 1 x daily - 7 x weekly - 1 sets - 10 reps - Child's Pose with Thread the Needle  - 1 x daily - 7 x weekly - 1 sets - 4 reps - Squat  - 1 x daily - 3 x weekly - 1 sets - 10 reps - Half Deadlift with Kettlebell  - 1 x daily - 3 x weekly - 1 sets - 10 reps - Hooklying Isometric Hip Flexion  - 1 x daily - 3 x weekly - 1 sets - 10 reps - Supine Bridge  - 1 x daily - 3 x weekly - 1 sets - 15 reps - Supine Alternating Shoulder Flexion  - 1 x daily - 3 x weekly - 2 sets - 10 reps - Supine Alternating Arm Punches on Foam Roll  - 1 x daily - 3 x weekly - 2 sets - 10 reps - Hooklying Clamshell with Resistance  - 1 x daily - 3 x weekly - 2 sets - 10 reps - Supine March with Resistance Band  - 1 x daily - 3 x weekly - 2 sets - 10 reps  ASSESSMENT:  CLINICAL IMPRESSION: Patient is a 68 y.o. female who was seen today for physical therapy  treatment for overactive bladder, levator spasm, and fecal incontinence. Patient had s/p Robotic assisted sacrocolpopexy, right salpingo- oophorectomy, cystoscopy on 11/08/22.  Patient has had one time of urinary leakage. 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 She would at times bear down with pelvic floor contraction in stead of pulling up. She needed verbal cues to contract the pelvic floor with going from sit to stand and back. She has difficulty with holding the pelvic floor contraction if she goes quickly but does better with slower contraction. Patient feels she does not urinate as much when not on Gemtessa.   Patient will benefit from skilled therapy to improve pelvic floor coordination and strength to reduce  leakage.   OBJECTIVE IMPAIRMENTS: decreased coordination, decreased ROM, decreased strength, increased fascial restrictions, and increased muscle spasms.   ACTIVITY LIMITATIONS: continence, toileting, and locomotion level  PARTICIPATION LIMITATIONS: driving, shopping, and community activity  PERSONAL FACTORS: Fitness and 1-2 comorbidities: s/p Robotic assisted sacrocolpopexy, right salpingo- oophorectomy, cystoscopy on 11/08/22 ;  Appendectomy 1986; vaginal prolapse repair with hysterectomy 1986;  Left knee replacement are also affecting patient's functional outcome.   REHAB POTENTIAL: Excellent  CLINICAL DECISION MAKING: Evolving/moderate complexity  EVALUATION COMPLEXITY: Moderate   GOALS: Goals reviewed with patient? Yes  SHORT TERM GOALS: Target date: 06/02/23  Patient educated and understands what bladder irritants are and how they affect the bladder.  Baseline: Goal status: Met 04/11/23  2.  Patient educated on urge to void to reduce the urgency.  Baseline:  Goal status: Met 04/11/23  3.  Patient is able to perform diaphragmatic breathing to relax the pelvic floor.  Baseline:  Goal status: Met 04/18/23   LONG TERM GOALS: Target date: 10/20/23  Patient independent with advanced HEP for core, pelvic floor and hip strength.  Baseline:  Goal status: Ongoing 06/20/23  2.  Patient is able to have the urge to void and walk to the bathroom without leaking urine due to increased in strength.  Baseline:  Goal status: met 06/08/23  3.  Patient is able to go out in public and not worry where the bathroom are due to her urgency decreased >/= 50%.  Baseline:  Goal status: ongoing 06/20/23  4.  Patient reports her fecal leakage has improved >/= 75% due to increased in pelvic floor strength >/= 3/5.  Baseline: Yesterday had leakage all day Goal status: ongoing 06/20/23   PLAN:  PT FREQUENCY: 1-2x/week  PT DURATION: 12 weeks  PLANNED INTERVENTIONS: 97110-Therapeutic exercises, 97530-  Therapeutic activity, 97112- Neuromuscular re-education, 97535- Self Care, [id number]- Manual therapy, [id number]- Electrical stimulation (unattended), [phone]- Electrical stimulation (manual), 97035- Ultrasound, Patient/Family education, Dry Needling, Joint mobilization, Spinal mobilization, Cryotherapy, Moist heat, and Biofeedback  PLAN FOR NEXT SESSION:   m go over the bladder diary, update HEP; see if she is able to cut back 1 time with night time urination; need new auth to continue  Marsha Skeen, PT 07/20/23 3:44 PM

## 2023-07-21 ENCOUNTER — Other Ambulatory Visit: Payer: Self-pay | Admitting: Internal Medicine

## 2023-07-27 ENCOUNTER — Encounter: Payer: Self-pay | Admitting: Physical Therapy

## 2023-07-27 DIAGNOSIS — M6281 Muscle weakness (generalized): Secondary | ICD-10-CM | POA: Diagnosis not present

## 2023-07-27 DIAGNOSIS — R278 Other lack of coordination: Secondary | ICD-10-CM

## 2023-07-27 NOTE — Therapy (Signed)
 OUTPATIENT PHYSICAL THERAPY FEMALE PELVIC TREATMENT   Patient Name: Olivia Paul MRN: [id number] DOB:Dec 20, 1955, 68 y.o., female Today's Date: 07/27/2023     END OF SESSION:  PT End of Session - 07/27/23 1510     Visit Number 12    Date for PT Re-Evaluation 10/20/23    Authorization Type [employer] medicare    Authorization Time Period 06/21/2023-08/02/2023    Authorization - Visit Number 4    Authorization - Number of Visits 6    Progress Note Due on Visit 10    PT Start Time 1500    PT Stop Time 1545    PT Time Calculation (min) 45 min    Activity Tolerance Patient tolerated treatment well    Behavior During Therapy [facility] for tasks assessed/performed           Past Medical History:  Diagnosis Date   Allergy     Anxiety    Arthritis    lt knee   Asthma 2011   Complication of anesthesia    Fatty liver    GERD (gastroesophageal reflux disease)    Headache    sinus   History of COVID-19 10/11/2022   PONV (postoperative nausea and vomiting)    Pre-diabetes    Sleep apnea    cpap set on 4   Vitamin D  deficiency    Wears glasses    Past Surgical History:  Procedure Laterality Date   APPENDECTOMY  1986   BREAST BIOPSY     BREAST EXCISIONAL BIOPSY     CYSTOSCOPY N/A 11/08/2022   Procedure: CYSTOSCOPY;  Surgeon: Arma Lamp, MD;  Location: [facility] [place];  Service: Gynecology;  Laterality: N/A;   KNEE ARTHROSCOPY WITH MENISCAL REPAIR Left 02/21/2018   LIPOMA EXCISION     ROBOTIC ASSISTED LAPAROSCOPIC SACROCOLPOPEXY N/A 11/08/2022   Procedure: XI ROBOTIC ASSISTED LAPAROSCOPIC SACROCOLPOPEXY;  Surgeon: Arma Lamp, MD;  Location: [facility];  Service: Gynecology;  Laterality: N/A;   TOTAL KNEE ARTHROPLASTY Left 11/16/2018   Procedure: TOTAL KNEE ARTHROPLASTY;  Surgeon: Neil Balls, MD;  Location: [facility];  Service: Orthopedics;  Laterality: Left;   total knee arthroplasty-revision Left 08/31/2020   Dr, Aquilla Knapp    TRIGGER FINGER RELEASE Right 12/25/2020   VAGINAL HYSTERECTOMY  1986   VAGINAL PROLAPSE REPAIR     1986   XI ROBOTIC ASSISTED OOPHORECTOMY Right 11/08/2022   Procedure: XI ROBOTIC ASSISTED OOPHORECTOMY;  Surgeon: Arma Lamp, MD;  Location: [facility];  Service: Gynecology;  Laterality: Right;   Patient Active Problem List   Diagnosis Date Noted   Encounter for general adult medical examination w/o abnormal findings 05/16/2023   Elevated blood pressure reading 05/16/2023   Vaginal vault prolapse after hysterectomy 11/08/2022   COVID-19 10/18/2022   Cough in adult 10/18/2022   Pure hypercholesterolemia 05/22/2022   Prediabetes 05/22/2022   OSA on CPAP 05/22/2022   Mood disorder (HCC) 12/27/2021   Urinary retention 09/02/2021   Menopause 09/02/2021   OAB (overactive bladder) 09/02/2021   Daytime sleepiness 07/02/2021   Insomnia 07/02/2021   Episodic tension-type headache, not intractable 03/31/2021   Lumbar radiculopathy 01/29/2021   Acquired trigger finger of right middle finger 11/26/2020   Impingement syndrome of right shoulder region 10/22/2020   Cervical radiculopathy 10/16/2020   Dizziness 08/02/2020   Class 3 severe obesity due to excess calories with serious comorbidity and body mass index (BMI) of 40.0 to 44.9 in adult 08/02/2020   Family history of heart disease  08/02/2020   Primary osteoarthritis of left knee 11/16/2018   Unilateral primary osteoarthritis, left knee 10/29/2018   Xanthelasma of right eyelid 03/26/2018   Anxiety 03/26/2018   Perennial allergic rhinitis 02/02/2018   Vitamin D  deficiency 07/27/2017   Moderate persistent asthma, uncomplicated 05/11/2017   Drug allergy  05/11/2017   Basal cell papilloma 03/20/2015   Changing skin lesion 01/13/2015   Asthma 11/05/2014   Allergic rhinoconjunctivitis 11/05/2014    PCP: Cleave Curling, MD  REFERRING PROVIDER: Arma Lamp, MD   REFERRING DIAG:  N32.81 (ICD-10-CM) - OAB  (overactive bladder)  [phone] (ICD-10-CM) - Levator spasm  R15.9 (ICD-10-CM) - Incontinence of feces, unspecified fecal incontinence type    THERAPY DIAG:  Muscle weakness (generalized)  Other lack of coordination  Rationale for Evaluation and Treatment: Rehabilitation  ONSET DATE: 1983  SUBJECTIVE:                                                                                                                                                                                           SUBJECTIVE STATEMENT: I only had one episode of stool seepage. It may of been due to the coffee. I get up more at night. I made the appt. To see my doctor. The Kathyanne Parkers is helping me.     PAIN:  Are you having pain? No   PRECAUTIONS: None  RED FLAGS: None   WEIGHT BEARING RESTRICTIONS: No  FALLS:  Has patient fallen in last 6 months? Yes. Number of falls 1 time and slipped on ice not due to balance.  OCCUPATION: retired  ACTIVITY LEVEL : water  aerobics  PLOF: Independent  PATIENT GOALS: reduce urinary leakage, not know where every bathroom is in the store  PERTINENT HISTORY:  s/p Robotic assisted sacrocolpopexy, right salpingo- oophorectomy, cystoscopy on 11/08/22 ; Appendectomy 1986; vaginal prolapse repair with hysterectomy 1986;  Left knee replacement Sexual abuse: No  BOWEL MOVEMENT: Pain with bowel movement: No Type of bowel movement:Type (Bristol Stool Scale) Type 4 and Frequency 1-2 days Fully empty rectum: Yes:   Leakage: Yes: does not always realize she had stool leakage but has not had it in the last several weeks Fiber supplement/laxative Yes  and Metamucil  URINATION: Pain with urination: No Fully empty bladder: Yes:   Stream: Strong Urgency: Yes leaks when pulling her pants down Frequency: average Leakage: Urge to void, Walking to the bathroom, and during the night Pads: Yes: wears a pad when goes out for activity  INTERCOURSE: no activity  Pain with intercourse:  none  PREGNANCY: Vaginal deliveries 2 Tearing No Episiotomy Yes   PROLAPSE: None   OBJECTIVE:  Note: Objective  measures were completed at Evaluation unless otherwise noted.  DIAGNOSTIC FINDINGS:  none  PATIENT SURVEYS:  PFIQ-7: 82; UIQ-7 43; CRAIQ-7 43 06/19/24: PFIQ-7: 61; UIQ-7 33; CRAIQ-7 29 07/13/23: PFIQ-7: 57; UIQ-7 29; CRAIQ-7 33  COGNITION: Overall cognitive status: Within functional limits for tasks assessed     SENSATION: Light touch: Appears intact   POSTURE: rounded shoulders, forward head, and decreased lumbar lordosis   LUMBARAROM/PROM: Lumbar ROM decreased by 25%   LOWER EXTREMITY ROM:  Passive ROM Right eval Left eval Left 06/20/23  Hip external rotation 80 50 70   (Blank rows = not tested)  LOWER EXTREMITY MMT:  MMT Right eval Left eval Right 06/20/23 Left 06/20/23 Right 07/27/23 Left  07/27/23  Hip flexion 4/5 4/5 4+/5 4+/5 4+/5 4+/5  Hip extension 3/5 3/5 3+/5 4/5 3+/5 4/5  Hip abduction 3-/5 3/5 3+/5 3+/5 4/5 4/5   (Blank rows = not tested) PALPATION:    Abdominal: Decreased movement of the lower rib cage, tightness in the diaphragm                External Perineal Exam: intact but dry                             Internal Pelvic Floor: tightness throughout the pelvic floor  Patient confirms identification and approves PT to assess internal pelvic floor and treatment Yes  PELVIC MMT:   MMT eval 04/18/23 05/25/23 06/08/23 06/15/23 07/04/23 07/20/23 07/27/23  Vaginal 1/5 2/5    2/5 for the second and third layer, 1/5 for the first layer 2/5 for all layers 2/5 with smallest lift when she breaths out to contract  Internal Anal Sphincter 1/5  3/5 weak hug 3/5 weak hug 3/5     External Anal Sphincter 1/5  3/5 weak hug 3/5 weak hug 3/5     Puborectalis 1/5  3/5 weak hug 3/5 weak hug 3/5     Diastasis Recti 1/5  N/a       (Blank rows = not tested)        TONE: increased  PROLAPSE: none  TODAY'S TREATMENT:  07/27/23 Manual: Internal pelvic  floor techniques: No emotional/communication barriers or cognitive limitation. Patient is motivated to learn. Patient understands and agrees with treatment goals and plan. PT explains patient will be examined in standing, sitting, and lying down to see how their muscles and joints work. When they are ready, they will be asked to remove their underwear so PT can examine their perineum. The patient is also given the option of providing their own chaperone as one is not provided in our facility. The patient also has the right and is explained the right to defer or refuse any part of the evaluation or treatment including the internal exam. With the patient's consent, PT will use one gloved finger to gently assess the muscles of the pelvic floor, seeing how well it contracts and relaxes and if there is muscle symmetry. After, the patient will get dressed and PT and patient will discuss exam findings and plan of care. PT and patient discuss plan of care, schedule, attendance policy and HEP activities.  Patient laying on side. Therapist gloved finger in the vaginal canal performing manual work to the levator ani, obturator internist, along the introitus, and superior transverse perineum to elongate tissue for further contraction Neuromuscular re-education: Pelvic floor contraction training: Therapist gloved finger in the vaginal canal taping the pelvic floor muscles to improve contraction,  tactile cues to abdomen to contract, hp adductors contraction to assist with pelvic floor contraction. Used different breaths to facilitate pelvic floor contraction.  Sitting vaginal contraction with leaning forward to have the anterior wall contract.     07/20/23 Manual: Internal pelvic floor techniques: No emotional/communication barriers or cognitive limitation. Patient is motivated to learn. Patient understands and agrees with treatment goals and plan. PT explains patient will be examined in standing, sitting, and lying  down to see how their muscles and joints work. When they are ready, they will be asked to remove their underwear so PT can examine their perineum. The patient is also given the option of providing their own chaperone as one is not provided in our facility. The patient also has the right and is explained the right to defer or refuse any part of the evaluation or treatment including the internal exam. With the patient's consent, PT will use one gloved finger to gently assess the muscles of the pelvic floor, seeing how well it contracts and relaxes and if there is muscle symmetry. After, the patient will get dressed and PT and patient will discuss exam findings and plan of care. PT and patient discuss plan of care, schedule, attendance policy and HEP activities.  Going through the vaginal canal with a gloved finger performing manual work to the introitus and levator ani to lengthen the tissue for contraction Neuromuscular re-education: Pelvic floor contraction training: Supine therapist gloved finger in the vaginal canal working on contraction with tactile cues and not bulging out Sitting pelvic floor contraction with leaning forward Sit to stand and back with pelvic floor contraction    07/13/23 Exercises: Stretches/mobility: Cat cow 15 x then hip wiggle Strengthening: Supine abdominal contraction with flexing her knees 10 x each leg Supine elbow press with extending her spine and engage the core with shoulders downward Supine bridges 10 x Supine hip in and out 10 x Supine hip flexion with band around knees and alternate shoulder flexion Quadruped alternate shoulder and hip lift with verbal cues to go slowly Self-care: Education on how to fill out a bladder diary to see reasons for urgency Education on stop drinking liquid at 9:00 PM and only water  for dinner to reduce the number of times she needs to urinate at night Education on deterring the need to urinate while getting to therapy and leaving  and at a doctors office due to her bladder not full enogh    PATIENT EDUCATION:  06/01/23 Education details: Therapist educated patient on bladder irritants, how to fill out bladder diary, and urge to voidAccess Code: [email or web address] Person educated: Patient Education method: Explanation, Demonstration, Tactile cues, Verbal cues, and Handouts Education comprehension: verbalized understanding, returned demonstration, verbal cues required, tactile cues required, and needs further education  HOME EXERCISE PROGRAM: 06/01/23 Access Code: [email or web address] URL: [email or web address] Date: 06/01/2023 Prepared by: Marsha Skeen  Exercises - Supine Diaphragmatic Breathing  - 2 x daily - 7 x weekly - 1 sets - 10 reps - Diaphragmatic Breathing in Child's Pose with Pelvic Floor Relaxation  - 1 x daily - 7 x weekly - 1 sets - 10 reps - Seated Piriformis Stretch with Trunk Bend  - 1 x daily - 7 x weekly - 1 sets - 2 reps - 30 sec hold - Diaphragmatic Breathing in Child's Pose with Pelvic Floor Relaxation  - 1 x daily - 7 x weekly - 1 sets - 1 reps - 1 min hold - Cat Cow  - 1 x daily -  7 x weekly - 1 sets - 10 reps - Child's Pose with Thread the Needle  - 1 x daily - 7 x weekly - 1 sets - 4 reps - Squat  - 1 x daily - 3 x weekly - 1 sets - 10 reps - Half Deadlift with Kettlebell  - 1 x daily - 3 x weekly - 1 sets - 10 reps - Hooklying Isometric Hip Flexion  - 1 x daily - 3 x weekly - 1 sets - 10 reps - Supine Bridge  - 1 x daily - 3 x weekly - 1 sets - 15 reps - Supine Alternating Shoulder Flexion  - 1 x daily - 3 x weekly - 2 sets - 10 reps - Supine Alternating Arm Punches on Foam Roll  - 1 x daily - 3 x weekly - 2 sets - 10 reps - Hooklying Clamshell with Resistance  - 1 x daily - 3 x weekly - 2 sets - 10 reps - Supine March with Resistance Band  - 1 x daily - 3 x weekly - 2 sets - 10 reps  ASSESSMENT:  CLINICAL IMPRESSION: Patient is a 68 y.o. female who was seen today for physical therapy   treatment for overactive bladder, levator spasm, and fecal incontinence. Patient had s/p Robotic assisted sacrocolpopexy, right salpingo- oophorectomy, cystoscopy on 11/08/22.  Patient has had one time of urinary leakage. 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 Patient had one episode of fecal smearing but not full emptying since last visit.  Pelvic floor strength is 2/5 with many tactile and verbal cues to assist with contraction.  She will contract the gluteals instead of the vaginal contraction. She is able to feel the contraction more in sitting. Patient will benefit from skilled therapy to improve pelvic floor coordination and strength to reduce leakage.   OBJECTIVE IMPAIRMENTS: decreased coordination, decreased ROM, decreased strength, increased fascial restrictions, and increased muscle spasms.   ACTIVITY LIMITATIONS: continence, toileting, and locomotion level  PARTICIPATION LIMITATIONS: driving, shopping, and community activity  PERSONAL FACTORS: Fitness and 1-2 comorbidities: s/p Robotic assisted sacrocolpopexy, right salpingo- oophorectomy, cystoscopy on 11/08/22 ; Appendectomy 1986; vaginal prolapse repair with hysterectomy 1986;  Left knee replacement are also affecting patient's functional outcome.   REHAB POTENTIAL: Excellent  CLINICAL DECISION MAKING: Evolving/moderate complexity  EVALUATION COMPLEXITY: Moderate   GOALS: Goals reviewed with patient? Yes  SHORT TERM GOALS: Target date: 06/02/23  Patient educated and understands what bladder irritants are and how they affect the bladder.  Baseline: Goal status: Met 04/11/23  2.  Patient educated on urge to void to reduce the urgency.  Baseline:  Goal status: Met 04/11/23  3.  Patient is able to perform diaphragmatic breathing to relax the pelvic floor.  Baseline:  Goal status: Met 04/18/23   LONG TERM GOALS: Target date: 10/20/23  Patient independent with advanced  HEP for core, pelvic floor and hip strength.  Baseline:  Goal status: Ongoing 07/27/23  2.  Patient is able to have the urge to void and walk to the bathroom without leaking urine due to increased in strength.  Baseline:  Goal status: met 06/08/23  3.  Patient is able to go out in public and not worry where the bathroom are due to her urgency decreased >/= 50%.  Baseline:  Goal status: ongoing 07/27/23  4.  Patient reports her fecal leakage has improved >/= 75% due to increased in pelvic floor  strength >/= 3/5.  Baseline: Yesterday had leakage all day Goal status: ongoing 07/27/23   PLAN:  PT FREQUENCY: 1-2x/week  PT DURATION: 12 weeks  PLANNED INTERVENTIONS: 97110-Therapeutic exercises, 97530- Therapeutic activity, 97112- Neuromuscular re-education, 97535- Self Care, 16109- Manual therapy, [id number]- Electrical stimulation (unattended), [phone]- Electrical stimulation (manual), 97035- Ultrasound, Patient/Family education, Dry Needling, Joint mobilization, Spinal mobilization, Cryotherapy, Moist heat, and Biofeedback  PLAN FOR NEXT SESSION:    update HEP; see if she is able to cut back 1 time with night time urination;  Marsha Skeen, PT 07/27/23 5:18 PM

## 2023-08-02 ENCOUNTER — Other Ambulatory Visit: Payer: Self-pay

## 2023-08-02 MED ORDER — FEXOFENADINE HCL 180 MG PO TABS: ORAL_TABLET | ORAL | 1 refills | Status: AC

## 2023-08-03 ENCOUNTER — Encounter: Payer: Self-pay | Admitting: Physical Therapy

## 2023-08-03 ENCOUNTER — Encounter: Admitting: Physical Therapy

## 2023-08-03 DIAGNOSIS — M6281 Muscle weakness (generalized): Secondary | ICD-10-CM | POA: Diagnosis not present

## 2023-08-03 DIAGNOSIS — R278 Other lack of coordination: Secondary | ICD-10-CM

## 2023-08-03 NOTE — Therapy (Signed)
 OUTPATIENT PHYSICAL THERAPY FEMALE PELVIC TREATMENT   Patient Name: Olivia Paul MRN: [id number] DOB:[birth year], 68 y.o., female Today's Date: 08/03/2023     END OF SESSION:  PT End of Session - 08/03/23 1559     Visit Number 13    Date for PT Re-Evaluation 10/20/23    Authorization Type [employer] medicare    Authorization Time Period 6/16-9/8    Authorization - Visit Number 1    Authorization - Number of Visits 6    PT Start Time 1500    PT Stop Time 1555    PT Time Calculation (min) 55 min    Activity Tolerance Patient tolerated treatment well    Behavior During Therapy [facility] for tasks assessed/performed            Past Medical History:  Diagnosis Date   Allergy     Anxiety    Arthritis    lt knee   Asthma 2011   Complication of anesthesia    Fatty liver    GERD (gastroesophageal reflux disease)    Headache    sinus   History of COVID-19 10/11/2022   PONV (postoperative nausea and vomiting)    Pre-diabetes    Sleep apnea    cpap set on 4   Vitamin D  deficiency    Wears glasses    Past Surgical History:  Procedure Laterality Date   APPENDECTOMY  1986   BREAST BIOPSY     BREAST EXCISIONAL BIOPSY     CYSTOSCOPY N/A 11/08/2022   Procedure: CYSTOSCOPY;  Surgeon: Arma Lamp, MD;  Location: [facility];  Service: Gynecology;  Laterality: N/A;   KNEE ARTHROSCOPY WITH MENISCAL REPAIR Left 02/21/2018   LIPOMA EXCISION     ROBOTIC ASSISTED LAPAROSCOPIC SACROCOLPOPEXY N/A 11/08/2022   Procedure: XI ROBOTIC ASSISTED LAPAROSCOPIC SACROCOLPOPEXY;  Surgeon: Arma Lamp, MD;  Location: [facility];  Service: Gynecology;  Laterality: N/A;   TOTAL KNEE ARTHROPLASTY Left 11/16/2018   Procedure: TOTAL KNEE ARTHROPLASTY;  Surgeon: Neil Balls, MD;  Location: [facility];  Service: Orthopedics;  Laterality: Left;   total knee arthroplasty-revision Left 08/31/2020   Dr, Aquilla Knapp   TRIGGER FINGER RELEASE Right 12/25/2020    VAGINAL HYSTERECTOMY  1986   VAGINAL PROLAPSE REPAIR     1986   XI ROBOTIC ASSISTED OOPHORECTOMY Right 11/08/2022   Procedure: XI ROBOTIC ASSISTED OOPHORECTOMY;  Surgeon: Arma Lamp, MD;  Location: [facility];  Service: Gynecology;  Laterality: Right;   Patient Active Problem List   Diagnosis Date Noted   Encounter for general adult medical examination w/o abnormal findings 05/16/2023   Elevated blood pressure reading 05/16/2023   Vaginal vault prolapse after hysterectomy 11/08/2022   COVID-19 10/18/2022   Cough in adult 10/18/2022   Pure hypercholesterolemia 05/22/2022   Prediabetes 05/22/2022   OSA on CPAP 05/22/2022   Mood disorder (HCC) 12/27/2021   Urinary retention 09/02/2021   Menopause 09/02/2021   OAB (overactive bladder) 09/02/2021   Daytime sleepiness 07/02/2021   Insomnia 07/02/2021   Episodic tension-type headache, not intractable 03/31/2021   Lumbar radiculopathy 01/29/2021   Acquired trigger finger of right middle finger 11/26/2020   Impingement syndrome of right shoulder region 10/22/2020   Cervical radiculopathy 10/16/2020   Dizziness 08/02/2020   Class 3 severe obesity due to excess calories with serious comorbidity and body mass index (BMI) of 40.0 to 44.9 in adult 08/02/2020   Family history of heart disease 08/02/2020   Primary osteoarthritis of left knee  11/16/2018   Unilateral primary osteoarthritis, left knee 10/29/2018   Xanthelasma of right eyelid 03/26/2018   Anxiety 03/26/2018   Perennial allergic rhinitis 02/02/2018   Vitamin D  deficiency 07/27/2017   Moderate persistent asthma, uncomplicated 05/11/2017   Drug allergy  05/11/2017   Basal cell papilloma 03/20/2015   Changing skin lesion 01/13/2015   Asthma 11/05/2014   Allergic rhinoconjunctivitis 11/05/2014    PCP: Cleave Curling, MD  REFERRING PROVIDER: Arma Lamp, MD   REFERRING DIAG:  N32.81 (ICD-10-CM) - OAB (overactive bladder)  [phone] (ICD-10-CM)  - Levator spasm  R15.9 (ICD-10-CM) - Incontinence of feces, unspecified fecal incontinence type    THERAPY DIAG:  Muscle weakness (generalized)  Other lack of coordination  Rationale for Evaluation and Treatment: Rehabilitation  ONSET DATE: 1983  SUBJECTIVE:                                                                                                                                                                                           SUBJECTIVE STATEMENT: I had one time to urinate and no urine came out. No fecal leakage.    PAIN:  Are you having pain? No   PRECAUTIONS: None  RED FLAGS: None   WEIGHT BEARING RESTRICTIONS: No  FALLS:  Has patient fallen in last 6 months? Yes. Number of falls 1 time and slipped on ice not due to balance.  OCCUPATION: retired  ACTIVITY LEVEL : water  aerobics  PLOF: Independent  PATIENT GOALS: reduce urinary leakage, not know where every bathroom is in the store  PERTINENT HISTORY:  s/p Robotic assisted sacrocolpopexy, right salpingo- oophorectomy, cystoscopy on 11/08/22 ; Appendectomy 1986; vaginal prolapse repair with hysterectomy 1986;  Left knee replacement Sexual abuse: No  BOWEL MOVEMENT: Pain with bowel movement: No Type of bowel movement:Type (Bristol Stool Scale) Type 4 and Frequency 1-2 days Fully empty rectum: Yes:   Leakage: Yes: does not always realize she had stool leakage but has not had it in the last several weeks Fiber supplement/laxative Yes  and Metamucil  URINATION: Pain with urination: No Fully empty bladder: Yes:   Stream: Strong Urgency: Yes leaks when pulling her pants down Frequency: average Leakage: Urge to void, Walking to the bathroom, and during the night Pads: Yes: wears a pad when goes out for activity  INTERCOURSE: no activity  Pain with intercourse: none  PREGNANCY: Vaginal deliveries 2 Tearing No Episiotomy Yes   PROLAPSE: None   OBJECTIVE:  Note: Objective measures were  completed at Evaluation unless otherwise noted.  DIAGNOSTIC FINDINGS:  none  PATIENT SURVEYS:  PFIQ-7: 82; UIQ-7 43; CRAIQ-7 43 06/19/24: PFIQ-7: 61; UIQ-7 33; CRAIQ-7 29  07/13/23: PFIQ-7: 57; UIQ-7 29; CRAIQ-7 33  COGNITION: Overall cognitive status: Within functional limits for tasks assessed     SENSATION: Light touch: Appears intact   POSTURE: rounded shoulders, forward head, and decreased lumbar lordosis   LUMBARAROM/PROM: Lumbar ROM decreased by 25%   LOWER EXTREMITY ROM:  Passive ROM Right eval Left eval Left 06/20/23  Hip external rotation 80 50 70   (Blank rows = not tested)  LOWER EXTREMITY MMT:  MMT Right eval Left eval Right 06/20/23 Left 06/20/23 Right 07/27/23 Left  07/27/23  Hip flexion 4/5 4/5 4+/5 4+/5 4+/5 4+/5  Hip extension 3/5 3/5 3+/5 4/5 3+/5 4/5  Hip abduction 3-/5 3/5 3+/5 3+/5 4/5 4/5   (Blank rows = not tested) PALPATION:    Abdominal: Decreased movement of the lower rib cage, tightness in the diaphragm                External Perineal Exam: intact but dry                             Internal Pelvic Floor: tightness throughout the pelvic floor  Patient confirms identification and approves PT to assess internal pelvic floor and treatment Yes  PELVIC MMT:   MMT eval 04/18/23 05/25/23 06/08/23 06/15/23 07/04/23 07/20/23 07/27/23  Vaginal 1/5 2/5    2/5 for the second and third layer, 1/5 for the first layer 2/5 for all layers 2/5 with smallest lift when she breaths out to contract  Internal Anal Sphincter 1/5  3/5 weak hug 3/5 weak hug 3/5     External Anal Sphincter 1/5  3/5 weak hug 3/5 weak hug 3/5     Puborectalis 1/5  3/5 weak hug 3/5 weak hug 3/5     Diastasis Recti 1/5  N/a       (Blank rows = not tested)        TONE: increased  PROLAPSE: none  TODAY'S TREATMENT:  08/03/23 Manual: Soft tissue mobilization: Circular massage of the abdomen to work on tissue mobility from having a hysterectomy Scar tissue mobilization: Scar work  along the umbilicus and the areas of the abdomen to reduce the restrictions and improve tissue mobility Myofascial release: Fascial release of the area around the ureters to work on urge to urinate and no urine comes out Fascial release around the umbilicus to reduce the tightness from the laparoscopic surgery Fascial release of the lower abdomen to reduce the restrictions along the area of hysterectomy and around the bladder to reduce the urgency    07/27/23 Manual: Internal pelvic floor techniques: No emotional/communication barriers or cognitive limitation. Patient is motivated to learn. Patient understands and agrees with treatment goals and plan. PT explains patient will be examined in standing, sitting, and lying down to see how their muscles and joints work. When they are ready, they will be asked to remove their underwear so PT can examine their perineum. The patient is also given the option of providing their own chaperone as one is not provided in our facility. The patient also has the right and is explained the right to defer or refuse any part of the evaluation or treatment including the internal exam. With the patient's consent, PT will use one gloved finger to gently assess the muscles of the pelvic floor, seeing how well it contracts and relaxes and if there is muscle symmetry. After, the patient will get dressed and PT and patient will discuss exam findings and  plan of care. PT and patient discuss plan of care, schedule, attendance policy and HEP activities.  Patient laying on side. Therapist gloved finger in the vaginal canal performing manual work to the levator ani, obturator internist, along the introitus, and superior transverse perineum to elongate tissue for further contraction Neuromuscular re-education: Pelvic floor contraction training: Therapist gloved finger in the vaginal canal taping the pelvic floor muscles to improve contraction, tactile cues to abdomen to contract, hp  adductors contraction to assist with pelvic floor contraction. Used different breaths to facilitate pelvic floor contraction.  Sitting vaginal contraction with leaning forward to have the anterior wall contract.     07/20/23 Manual: Internal pelvic floor techniques: No emotional/communication barriers or cognitive limitation. Patient is motivated to learn. Patient understands and agrees with treatment goals and plan. PT explains patient will be examined in standing, sitting, and lying down to see how their muscles and joints work. When they are ready, they will be asked to remove their underwear so PT can examine their perineum. The patient is also given the option of providing their own chaperone as one is not provided in our facility. The patient also has the right and is explained the right to defer or refuse any part of the evaluation or treatment including the internal exam. With the patient's consent, PT will use one gloved finger to gently assess the muscles of the pelvic floor, seeing how well it contracts and relaxes and if there is muscle symmetry. After, the patient will get dressed and PT and patient will discuss exam findings and plan of care. PT and patient discuss plan of care, schedule, attendance policy and HEP activities.  Going through the vaginal canal with a gloved finger performing manual work to the introitus and levator ani to lengthen the tissue for contraction Neuromuscular re-education: Pelvic floor contraction training: Supine therapist gloved finger in the vaginal canal working on contraction with tactile cues and not bulging out Sitting pelvic floor contraction with leaning forward Sit to stand and back with pelvic floor contraction    PATIENT EDUCATION:  06/01/23 Education details: Therapist educated patient on bladder irritants, how to fill out bladder diary, and urge to voidAccess Code: [email or web address] Person educated: Patient Education method: Explanation, Demonstration,  Tactile cues, Verbal cues, and Handouts Education comprehension: verbalized understanding, returned demonstration, verbal cues required, tactile cues required, and needs further education  HOME EXERCISE PROGRAM: 06/01/23 Access Code: [email or web address] URL: [email or web address] Date: 06/01/2023 Prepared by: Marsha Skeen  Exercises - Supine Diaphragmatic Breathing  - 2 x daily - 7 x weekly - 1 sets - 10 reps - Diaphragmatic Breathing in Child's Pose with Pelvic Floor Relaxation  - 1 x daily - 7 x weekly - 1 sets - 10 reps - Seated Piriformis Stretch with Trunk Bend  - 1 x daily - 7 x weekly - 1 sets - 2 reps - 30 sec hold - Diaphragmatic Breathing in Child's Pose with Pelvic Floor Relaxation  - 1 x daily - 7 x weekly - 1 sets - 1 reps - 1 min hold - Cat Cow  - 1 x daily - 7 x weekly - 1 sets - 10 reps - Child's Pose with Thread the Needle  - 1 x daily - 7 x weekly - 1 sets - 4 reps - Squat  - 1 x daily - 3 x weekly - 1 sets - 10 reps - Half Deadlift with Kettlebell  - 1 x daily - 3 x weekly - 1 sets - 10  reps - Hooklying Isometric Hip Flexion  - 1 x daily - 3 x weekly - 1 sets - 10 reps - Supine Bridge  - 1 x daily - 3 x weekly - 1 sets - 15 reps - Supine Alternating Shoulder Flexion  - 1 x daily - 3 x weekly - 2 sets - 10 reps - Supine Alternating Arm Punches on Foam Roll  - 1 x daily - 3 x weekly - 2 sets - 10 reps - Hooklying Clamshell with Resistance  - 1 x daily - 3 x weekly - 2 sets - 10 reps - Supine March with Resistance Band  - 1 x daily - 3 x weekly - 2 sets - 10 reps  ASSESSMENT:  CLINICAL IMPRESSION: Patient is a 68 y.o. female who was seen today for physical therapy  treatment for overactive bladder, levator spasm, and fecal incontinence. Patient had s/p Robotic assisted sacrocolpopexy, right salpingo- oophorectomy, cystoscopy on 11/08/22.  Patient has had no  urinary leakage since last visit.  Patient had no episode of fecal smearing and feels like she is emptying her  stool. Patient is having good stool and urine urges. Patient only goes to the bathroom 1 time when out. She has slept through the night the last 3 nights.  Patient will benefit from skilled therapy to improve pelvic floor coordination and strength to reduce leakage.   OBJECTIVE IMPAIRMENTS: decreased coordination, decreased ROM, decreased strength, increased fascial restrictions, and increased muscle spasms.   ACTIVITY LIMITATIONS: continence, toileting, and locomotion level  PARTICIPATION LIMITATIONS: driving, shopping, and community activity  PERSONAL FACTORS: Fitness and 1-2 comorbidities: s/p Robotic assisted sacrocolpopexy, right salpingo- oophorectomy, cystoscopy on 11/08/22 ; Appendectomy 1986; vaginal prolapse repair with hysterectomy 1986;  Left knee replacement are also affecting patient's functional outcome.   REHAB POTENTIAL: Excellent  CLINICAL DECISION MAKING: Evolving/moderate complexity  EVALUATION COMPLEXITY: Moderate   GOALS: Goals reviewed with patient? Yes  SHORT TERM GOALS: Target date: 06/02/23  Patient educated and understands what bladder irritants are and how they affect the bladder.  Baseline: Goal status: Met 04/11/23  2.  Patient educated on urge to void to reduce the urgency.  Baseline:  Goal status: Met 04/11/23  3.  Patient is able to perform diaphragmatic breathing to relax the pelvic floor.  Baseline:  Goal status: Met 04/18/23   LONG TERM GOALS: Target date: 10/20/23  Patient independent with advanced HEP for core, pelvic floor and hip strength.  Baseline:  Goal status: Ongoing 07/27/23  2.  Patient is able to have the urge to void and walk to the bathroom without leaking urine due to increased in strength.  Baseline:  Goal status: met 06/08/23  3.  Patient is able to go out in public and not worry where the bathroom are due to her urgency decreased >/= 50%.  Baseline:  Goal status: ongoing 07/27/23  4.  Patient reports her fecal leakage has  improved >/= 75% due to increased in pelvic floor strength >/= 3/5.  Baseline: Yesterday had leakage all day Goal status: ongoing 07/27/23   PLAN:  PT FREQUENCY: 1-2x/week  PT DURATION: 12 weeks  PLANNED INTERVENTIONS: 97110-Therapeutic exercises, 97530- Therapeutic activity, 97112- Neuromuscular re-education, 97535- Self Care, [id number]- Manual therapy, [id number]- Electrical stimulation (unattended), [phone]- Electrical stimulation (manual), 97035- Ultrasound, Patient/Family education, Dry Needling, Joint mobilization, Spinal mobilization, Cryotherapy, Moist heat, and Biofeedback  PLAN FOR NEXT SESSION:    update HEP  Marsha Skeen, PT 08/03/23 4:14 PM

## 2023-08-08 ENCOUNTER — Encounter: Payer: Self-pay | Admitting: Physical Therapy

## 2023-08-15 ENCOUNTER — Ambulatory Visit: Payer: Self-pay | Admitting: Physical Therapy

## 2023-08-15 ENCOUNTER — Encounter: Payer: Self-pay | Admitting: Physical Therapy

## 2023-08-15 DIAGNOSIS — M6281 Muscle weakness (generalized): Secondary | ICD-10-CM

## 2023-08-15 DIAGNOSIS — R278 Other lack of coordination: Secondary | ICD-10-CM

## 2023-08-15 NOTE — Therapy (Signed)
 OUTPATIENT PHYSICAL THERAPY FEMALE PELVIC TREATMENT   Patient Name: Olivia Paul MRN: [id number] DOB:1955/04/15, 68 y.o., female Today's Date: 08/15/2023     END OF SESSION:  PT End of Session - 08/15/23 0933     Visit Number 14    Date for PT Re-Evaluation 10/20/23    Authorization Type [employer] medicare    Authorization Time Period 6/16-9/8    Authorization - Visit Number 2    Authorization - Number of Visits 6    Progress Note Due on Visit 10    PT Start Time 0930    PT Stop Time 1015    PT Time Calculation (min) 45 min    Activity Tolerance Patient tolerated treatment well    Behavior During Therapy [facility] for tasks assessed/performed            Past Medical History:  Diagnosis Date   Allergy     Anxiety    Arthritis    lt knee   Asthma 2011   Complication of anesthesia    Fatty liver    GERD (gastroesophageal reflux disease)    Headache    sinus   History of COVID-19 10/11/2022   PONV (postoperative nausea and vomiting)    Pre-diabetes    Sleep apnea    cpap set on 4   Vitamin D  deficiency    Wears glasses    Past Surgical History:  Procedure Laterality Date   APPENDECTOMY  1986   BREAST BIOPSY     BREAST EXCISIONAL BIOPSY     CYSTOSCOPY N/A 11/08/2022   Procedure: CYSTOSCOPY;  Surgeon: Marilynne Rosaline SAILOR, MD;  Location: [facility];  Service: Gynecology;  Laterality: N/A;   KNEE ARTHROSCOPY WITH MENISCAL REPAIR Left 02/21/2018   LIPOMA EXCISION     ROBOTIC ASSISTED LAPAROSCOPIC SACROCOLPOPEXY N/A 11/08/2022   Procedure: XI ROBOTIC ASSISTED LAPAROSCOPIC SACROCOLPOPEXY;  Surgeon: Marilynne Rosaline SAILOR, MD;  Location: [facility];  Service: Gynecology;  Laterality: N/A;   TOTAL KNEE ARTHROPLASTY Left 11/16/2018   Procedure: TOTAL KNEE ARTHROPLASTY;  Surgeon: Yvone Rush, MD;  Location: [facility];  Service: Orthopedics;  Laterality: Left;   total knee arthroplasty-revision Left 08/31/2020   Dr, Londa   TRIGGER FINGER  RELEASE Right 12/25/2020   VAGINAL HYSTERECTOMY  1986   VAGINAL PROLAPSE REPAIR     1986   XI ROBOTIC ASSISTED OOPHORECTOMY Right 11/08/2022   Procedure: XI ROBOTIC ASSISTED OOPHORECTOMY;  Surgeon: Marilynne Rosaline SAILOR, MD;  Location: [facility];  Service: Gynecology;  Laterality: Right;   Patient Active Problem List   Diagnosis Date Noted   Encounter for general adult medical examination w/o abnormal findings 05/16/2023   Elevated blood pressure reading 05/16/2023   Vaginal vault prolapse after hysterectomy 11/08/2022   COVID-19 10/18/2022   Cough in adult 10/18/2022   Pure hypercholesterolemia 05/22/2022   Prediabetes 05/22/2022   OSA on CPAP 05/22/2022   Mood disorder (HCC) 12/27/2021   Urinary retention 09/02/2021   Menopause 09/02/2021   OAB (overactive bladder) 09/02/2021   Daytime sleepiness 07/02/2021   Insomnia 07/02/2021   Episodic tension-type headache, not intractable 03/31/2021   Lumbar radiculopathy 01/29/2021   Acquired trigger finger of right middle finger 11/26/2020   Impingement syndrome of right shoulder region 10/22/2020   Cervical radiculopathy 10/16/2020   Dizziness 08/02/2020   Class 3 severe obesity due to excess calories with serious comorbidity and body mass index (BMI) of 40.0 to 44.9 in adult 08/02/2020   Family history of heart  disease 08/02/2020   Primary osteoarthritis of left knee 11/16/2018   Unilateral primary osteoarthritis, left knee 10/29/2018   Xanthelasma of right eyelid 03/26/2018   Anxiety 03/26/2018   Perennial allergic rhinitis 02/02/2018   Vitamin D  deficiency 07/27/2017   Moderate persistent asthma, uncomplicated 05/11/2017   Drug allergy  05/11/2017   Basal cell papilloma 03/20/2015   Changing skin lesion 01/13/2015   Asthma 11/05/2014   Allergic rhinoconjunctivitis 11/05/2014    PCP: Jarold Medici, MD  REFERRING PROVIDER: Marilynne Rosaline SAILOR, MD   REFERRING DIAG:  N32.81 (ICD-10-CM) - OAB (overactive  bladder)  [phone] (ICD-10-CM) - Levator spasm  R15.9 (ICD-10-CM) - Incontinence of feces, unspecified fecal incontinence type    THERAPY DIAG:  Muscle weakness (generalized)  Other lack of coordination  Rationale for Evaluation and Treatment: Rehabilitation  ONSET DATE: 1983  SUBJECTIVE:                                                                                                                                                                                           SUBJECTIVE STATEMENT: I have not had fecal leakage since last week. No urinary leakage. I am sleeping through the night now without getting up.    PAIN:  Are you having pain? No   PRECAUTIONS: None  RED FLAGS: None   WEIGHT BEARING RESTRICTIONS: No  FALLS:  Has patient fallen in last 6 months? Yes. Number of falls 1 time and slipped on ice not due to balance.  OCCUPATION: retired  ACTIVITY LEVEL : water  aerobics  PLOF: Independent  PATIENT GOALS: reduce urinary leakage, not know where every bathroom is in the store  PERTINENT HISTORY:  s/p Robotic assisted sacrocolpopexy, right salpingo- oophorectomy, cystoscopy on 11/08/22 ; Appendectomy 1986; vaginal prolapse repair with hysterectomy 1986;  Left knee replacement Sexual abuse: No  BOWEL MOVEMENT: Pain with bowel movement: No Type of bowel movement:Type (Bristol Stool Scale) Type 4 and Frequency 1-2 days Fully empty rectum: Yes:   Leakage: Yes: does not always realize she had stool leakage but has not had it in the last several weeks Fiber supplement/laxative Yes  and Metamucil  URINATION: Pain with urination: No Fully empty bladder: Yes:   Stream: Strong Urgency: Yes leaks when pulling her pants down Frequency: average Leakage: Urge to void, Walking to the bathroom, and during the night Pads: Yes: wears a pad when goes out for activity  INTERCOURSE: no activity  Pain with intercourse: none  PREGNANCY: Vaginal deliveries 2 Tearing  No Episiotomy Yes   PROLAPSE: None   OBJECTIVE:  Note: Objective measures were completed at Evaluation unless otherwise noted.  DIAGNOSTIC FINDINGS:  none  PATIENT SURVEYS:  PFIQ-7: 82; UIQ-7 43; CRAIQ-7 43 06/19/24: PFIQ-7: 61; UIQ-7 33; CRAIQ-7 29 07/13/23: PFIQ-7: 57; UIQ-7 29; CRAIQ-7 33  COGNITION: Overall cognitive status: Within functional limits for tasks assessed     SENSATION: Light touch: Appears intact   POSTURE: rounded shoulders, forward head, and decreased lumbar lordosis   LUMBARAROM/PROM: Lumbar ROM decreased by 25%   LOWER EXTREMITY ROM:  Passive ROM Right eval Left eval Left 06/20/23  Hip external rotation 80 50 70   (Blank rows = not tested)  LOWER EXTREMITY MMT:  MMT Right eval Left eval Right 06/20/23 Left 06/20/23 Right 07/27/23 Left  07/27/23  Hip flexion 4/5 4/5 4+/5 4+/5 4+/5 4+/5  Hip extension 3/5 3/5 3+/5 4/5 3+/5 4/5  Hip abduction 3-/5 3/5 3+/5 3+/5 4/5 4/5   (Blank rows = not tested) PALPATION:    Abdominal: Decreased movement of the lower rib cage, tightness in the diaphragm                External Perineal Exam: intact but dry                             Internal Pelvic Floor: tightness throughout the pelvic floor  Patient confirms identification and approves PT to assess internal pelvic floor and treatment Yes  PELVIC MMT:   MMT eval 04/18/23 05/25/23 06/08/23 06/15/23 07/04/23 07/20/23 07/27/23  Vaginal 1/5 2/5    2/5 for the second and third layer, 1/5 for the first layer 2/5 for all layers 2/5 with smallest lift when she breaths out to contract  Internal Anal Sphincter 1/5  3/5 weak hug 3/5 weak hug 3/5     External Anal Sphincter 1/5  3/5 weak hug 3/5 weak hug 3/5     Puborectalis 1/5  3/5 weak hug 3/5 weak hug 3/5     Diastasis Recti 1/5  N/a       (Blank rows = not tested)        TONE: increased  PROLAPSE: none  TODAY'S TREATMENT:  08/15/23 Exercises: Stretches/mobility: Piriformis stretch sitting holding 30 sec  bil.  Karolynn pose with breathing into her back holding 1 min.  Cat cow 10 x Tried thread the needle but hurt her knee Strengthening: Supine clamshell with 2 dot theraloop 20 x with core engaged Supine with band around knees alternate shoulder and hip flexion with core engaged Bridge with hip abduction and band around the knees Standing hip 3 way with core engaged 15 x each Squat with pelvic floor contraction 15 x with cues to not let knees go past toes Dead lift with core engaged 15 x    08/03/23 Manual: Soft tissue mobilization: Circular massage of the abdomen to work on tissue mobility from having a hysterectomy Scar tissue mobilization: Scar work along the umbilicus and the areas of the abdomen to reduce the restrictions and improve tissue mobility Myofascial release: Fascial release of the area around the ureters to work on urge to urinate and no urine comes out Fascial release around the umbilicus to reduce the tightness from the laparoscopic surgery Fascial release of the lower abdomen to reduce the restrictions along the area of hysterectomy and around the bladder to reduce the urgency    07/27/23 Manual: Internal pelvic floor techniques: No emotional/communication barriers or cognitive limitation. Patient is motivated to learn. Patient understands and agrees with treatment goals and plan. PT explains patient will be examined in standing, sitting, and lying  down to see how their muscles and joints work. When they are ready, they will be asked to remove their underwear so PT can examine their perineum. The patient is also given the option of providing their own chaperone as one is not provided in our facility. The patient also has the right and is explained the right to defer or refuse any part of the evaluation or treatment including the internal exam. With the patient's consent, PT will use one gloved finger to gently assess the muscles of the pelvic floor, seeing how well it  contracts and relaxes and if there is muscle symmetry. After, the patient will get dressed and PT and patient will discuss exam findings and plan of care. PT and patient discuss plan of care, schedule, attendance policy and HEP activities.  Patient laying on side. Therapist gloved finger in the vaginal canal performing manual work to the levator ani, obturator internist, along the introitus, and superior transverse perineum to elongate tissue for further contraction Neuromuscular re-education: Pelvic floor contraction training: Therapist gloved finger in the vaginal canal taping the pelvic floor muscles to improve contraction, tactile cues to abdomen to contract, hp adductors contraction to assist with pelvic floor contraction. Used different breaths to facilitate pelvic floor contraction.  Sitting vaginal contraction with leaning forward to have the anterior wall contract.      PATIENT EDUCATION:  08/15/23 Education details: Therapist educated patient on bladder irritants, how to fill out bladder diary, and urge to voidAccess Code: [email or web address] Person educated: Patient Education method: Explanation, Demonstration, Tactile cues, Verbal cues, and Handouts Education comprehension: verbalized understanding, returned demonstration, verbal cues required, tactile cues required, and needs further education  HOME EXERCISE PROGRAM: 08/15/23 Access Code: [email or web address] URL: [email or web address] Date: 08/15/2023 Prepared by: Channing Pereyra  Exercises - Seated Piriformis Stretch with Trunk Bend  - 1 x daily - 7 x weekly - 1 sets - 2 reps - 30 sec hold - Diaphragmatic Breathing in Child's Pose with Pelvic Floor Relaxation  - 1 x daily - 7 x weekly - 1 sets - 1 reps - 1 min hold - Cat Cow  - 1 x daily - 7 x weekly - 1 sets - 10 reps - Dead Bug  - 1 x daily - 3 x weekly - 2 sets - 10 reps - Bridge with Hip Abduction and Resistance  - 1 x daily - 3 x weekly - 3 sets - 10 reps - Hooklying Clamshell with  Resistance  - 1 x daily - 3 x weekly - 2 sets - 10 reps - Squat  - 1 x daily - 3 x weekly - 1 sets - 10 reps - Half Deadlift with Kettlebell  - 1 x daily - 3 x weekly - 1 sets - 10 reps - Standing 3-Way Leg Reach with Resistance at Ankles and Counter Support  - 1 x daily - 3 x weekly - 1 sets - 15 reps  ASSESSMENT: CLINICAL IMPRESSION: Patient is a 68 y.o. female who was seen today for physical therapy  treatment for overactive bladder, levator spasm, and fecal incontinence. Patient had s/p Robotic assisted sacrocolpopexy, right salpingo- oophorectomy, cystoscopy on 11/08/22.  Patient has had no  urinary or fecal leakage since last visit.  Her HEP has been updated to challenge her pelvic floor and core. Patient has been shopping without going to the bathroom.  Patient will benefit from skilled therapy to improve pelvic floor coordination and strength to reduce leakage.   OBJECTIVE IMPAIRMENTS: decreased coordination, decreased  ROM, decreased strength, increased fascial restrictions, and increased muscle spasms.   ACTIVITY LIMITATIONS: continence, toileting, and locomotion level  PARTICIPATION LIMITATIONS: driving, shopping, and community activity  PERSONAL FACTORS: Fitness and 1-2 comorbidities: s/p Robotic assisted sacrocolpopexy, right salpingo- oophorectomy, cystoscopy on 11/08/22 ; Appendectomy 1986; vaginal prolapse repair with hysterectomy 1986;  Left knee replacement are also affecting patient's functional outcome.   REHAB POTENTIAL: Excellent  CLINICAL DECISION MAKING: Evolving/moderate complexity  EVALUATION COMPLEXITY: Moderate   GOALS: Goals reviewed with patient? Yes  SHORT TERM GOALS: Target date: 06/02/23  Patient educated and understands what bladder irritants are and how they affect the bladder.  Baseline: Goal status: Met 04/11/23  2.  Patient educated on urge to void to reduce the urgency.  Baseline:  Goal status: Met 04/11/23  3.  Patient is able to perform  diaphragmatic breathing to relax the pelvic floor.  Baseline:  Goal status: Met 04/18/23   LONG TERM GOALS: Target date: 10/20/23  Patient independent with advanced HEP for core, pelvic floor and hip strength.  Baseline:  Goal status: Ongoing 07/27/23  2.  Patient is able to have the urge to void and walk to the bathroom without leaking urine due to increased in strength.  Baseline:  Goal status: met 06/08/23  3.  Patient is able to go out in public and not worry where the bathroom are due to her urgency decreased >/= 50%.  Baseline:  Goal status: ongoing 07/27/23  4.  Patient reports her fecal leakage has improved >/= 75% due to increased in pelvic floor strength >/= 3/5.  Baseline: Yesterday had leakage all day Goal status: ongoing 07/27/23   PLAN:  PT FREQUENCY: 1-2x/week  PT DURATION: 12 weeks  PLANNED INTERVENTIONS: 97110-Therapeutic exercises, 97530- Therapeutic activity, 97112- Neuromuscular re-education, 97535- Self Care, [id number]- Manual therapy, [id number]- Electrical stimulation (unattended), [phone]- Electrical stimulation (manual), 97035- Ultrasound, Patient/Family education, Dry Needling, Joint mobilization, Spinal mobilization, Cryotherapy, Moist heat, and Biofeedback  PLAN FOR NEXT SESSION:    update HEP, if doing well then can be discharged  Channing Pereyra, PT 08/15/23 10:16 AM

## 2023-08-26 ENCOUNTER — Other Ambulatory Visit: Payer: Self-pay | Admitting: Internal Medicine

## 2023-08-29 ENCOUNTER — Encounter: Payer: Self-pay | Attending: Obstetrics and Gynecology | Admitting: Physical Therapy

## 2023-08-29 ENCOUNTER — Encounter: Payer: Self-pay | Admitting: Physical Therapy

## 2023-08-29 DIAGNOSIS — M6281 Muscle weakness (generalized): Secondary | ICD-10-CM | POA: Diagnosis present

## 2023-08-29 DIAGNOSIS — R278 Other lack of coordination: Secondary | ICD-10-CM | POA: Insufficient documentation

## 2023-08-29 NOTE — Therapy (Signed)
 OUTPATIENT PHYSICAL THERAPY FEMALE PELVIC TREATMENT   Patient Name: Olivia Paul MRN: [id number] DOB:11-20-1955, 68 y.o., female Today's Date: 08/29/2023     END OF SESSION:  PT End of Session - 08/29/23 1306     Visit Number 15    Date for PT Re-Evaluation 10/20/23    Authorization Type [employer] medicare    Authorization Time Period 6/16-9/8    Authorization - Visit Number 3    Authorization - Number of Visits 6    Progress Note Due on Visit 10    PT Start Time 1300    PT Stop Time 1345    PT Time Calculation (min) 45 min    Activity Tolerance Patient tolerated treatment well    Behavior During Therapy [facility] for tasks assessed/performed            Past Medical History:  Diagnosis Date   Allergy     Anxiety    Arthritis    lt knee   Asthma 2011   Complication of anesthesia    Fatty liver    GERD (gastroesophageal reflux disease)    Headache    sinus   History of COVID-19 10/11/2022   PONV (postoperative nausea and vomiting)    Pre-diabetes    Sleep apnea    cpap set on 4   Vitamin D  deficiency    Wears glasses    Past Surgical History:  Procedure Laterality Date   APPENDECTOMY  1986   BREAST BIOPSY     BREAST EXCISIONAL BIOPSY     CYSTOSCOPY N/A 11/08/2022   Procedure: CYSTOSCOPY;  Surgeon: Marilynne Rosaline SAILOR, MD;  Location: [facility];  Service: Gynecology;  Laterality: N/A;   KNEE ARTHROSCOPY WITH MENISCAL REPAIR Left 02/21/2018   LIPOMA EXCISION     ROBOTIC ASSISTED LAPAROSCOPIC SACROCOLPOPEXY N/A 11/08/2022   Procedure: XI ROBOTIC ASSISTED LAPAROSCOPIC SACROCOLPOPEXY;  Surgeon: Marilynne Rosaline SAILOR, MD;  Location: [facility];  Service: Gynecology;  Laterality: N/A;   TOTAL KNEE ARTHROPLASTY Left 11/16/2018   Procedure: TOTAL KNEE ARTHROPLASTY;  Surgeon: Yvone Rush, MD;  Location: [facility];  Service: Orthopedics;  Laterality: Left;   total knee arthroplasty-revision Left 08/31/2020   Dr, Londa   TRIGGER  FINGER RELEASE Right 12/25/2020   VAGINAL HYSTERECTOMY  1986   VAGINAL PROLAPSE REPAIR     1986   XI ROBOTIC ASSISTED OOPHORECTOMY Right 11/08/2022   Procedure: XI ROBOTIC ASSISTED OOPHORECTOMY;  Surgeon: Marilynne Rosaline SAILOR, MD;  Location: [facility];  Service: Gynecology;  Laterality: Right;   Patient Active Problem List   Diagnosis Date Noted   Encounter for general adult medical examination w/o abnormal findings 05/16/2023   Elevated blood pressure reading 05/16/2023   Vaginal vault prolapse after hysterectomy 11/08/2022   COVID-19 10/18/2022   Cough in adult 10/18/2022   Pure hypercholesterolemia 05/22/2022   Prediabetes 05/22/2022   OSA on CPAP 05/22/2022   Mood disorder (HCC) 12/27/2021   Urinary retention 09/02/2021   Menopause 09/02/2021   OAB (overactive bladder) 09/02/2021   Daytime sleepiness 07/02/2021   Insomnia 07/02/2021   Episodic tension-type headache, not intractable 03/31/2021   Lumbar radiculopathy 01/29/2021   Acquired trigger finger of right middle finger 11/26/2020   Impingement syndrome of right shoulder region 10/22/2020   Cervical radiculopathy 10/16/2020   Dizziness 08/02/2020   Class 3 severe obesity due to excess calories with serious comorbidity and body mass index (BMI) of 40.0 to 44.9 in adult 08/02/2020   Family history of heart  disease 08/02/2020   Primary osteoarthritis of left knee 11/16/2018   Unilateral primary osteoarthritis, left knee 10/29/2018   Xanthelasma of right eyelid 03/26/2018   Anxiety 03/26/2018   Perennial allergic rhinitis 02/02/2018   Vitamin D  deficiency 07/27/2017   Moderate persistent asthma, uncomplicated 05/11/2017   Drug allergy  05/11/2017   Basal cell papilloma 03/20/2015   Changing skin lesion 01/13/2015   Asthma 11/05/2014   Allergic rhinoconjunctivitis 11/05/2014    PCP: Jarold Medici, MD  REFERRING PROVIDER: Marilynne Rosaline SAILOR, MD   REFERRING DIAG:  N32.81 (ICD-10-CM) - OAB  (overactive bladder)  [phone] (ICD-10-CM) - Levator spasm  R15.9 (ICD-10-CM) - Incontinence of feces, unspecified fecal incontinence type    THERAPY DIAG:  Muscle weakness (generalized)  Other lack of coordination  Rationale for Evaluation and Treatment: Rehabilitation  ONSET DATE: 1983  SUBJECTIVE:                                                                                                                                                                                           SUBJECTIVE STATEMENT: No urinary leakage.    PAIN:  Are you having pain? No   PRECAUTIONS: None  RED FLAGS: None   WEIGHT BEARING RESTRICTIONS: No  FALLS:  Has patient fallen in last 6 months? Yes. Number of falls 1 time and slipped on ice not due to balance.  OCCUPATION: retired  ACTIVITY LEVEL : water  aerobics  PLOF: Independent  PATIENT GOALS: reduce urinary leakage, not know where every bathroom is in the store  PERTINENT HISTORY:  s/p Robotic assisted sacrocolpopexy, right salpingo- oophorectomy, cystoscopy on 11/08/22 ; Appendectomy 1986; vaginal prolapse repair with hysterectomy 1986;  Left knee replacement Sexual abuse: No  BOWEL MOVEMENT: Pain with bowel movement: No Type of bowel movement:Type (Bristol Stool Scale) Type 4 and Frequency 1-2 days Fully empty rectum: Yes:   Leakage: Yes: does not always realize she had stool leakage but has not had it in the last several weeks Fiber supplement/laxative Yes  and Metamucil  URINATION: Pain with urination: No Fully empty bladder: Yes:   Stream: Strong Urgency: Yes leaks when pulling her pants down Frequency: average Leakage: Urge to void, Walking to the bathroom, and during the night Pads: Yes: wears a pad when goes out for activity  INTERCOURSE: no activity  Pain with intercourse: none  PREGNANCY: Vaginal deliveries 2 Tearing No Episiotomy Yes   PROLAPSE: None   OBJECTIVE:  Note: Objective measures were  completed at Evaluation unless otherwise noted.  DIAGNOSTIC FINDINGS:  none  PATIENT SURVEYS:  PFIQ-7: 82; UIQ-7 43; CRAIQ-7 43 06/19/24: PFIQ-7: 61; UIQ-7 33; CRAIQ-7 29 07/13/23: PFIQ-7:  57; UIQ-7 29; CRAIQ-7 33  COGNITION: Overall cognitive status: Within functional limits for tasks assessed     SENSATION: Light touch: Appears intact   POSTURE: rounded shoulders, forward head, and decreased lumbar lordosis   LUMBARAROM/PROM: Lumbar ROM decreased by 25%   LOWER EXTREMITY ROM:  Passive ROM Right eval Left eval Left 06/20/23  Hip external rotation 80 50 70   (Blank rows = not tested)  LOWER EXTREMITY MMT:  MMT Right eval Left eval Right 06/20/23 Left 06/20/23 Right 07/27/23 Left  07/27/23  Hip flexion 4/5 4/5 4+/5 4+/5 4+/5 4+/5  Hip extension 3/5 3/5 3+/5 4/5 3+/5 4/5  Hip abduction 3-/5 3/5 3+/5 3+/5 4/5 4/5   (Blank rows = not tested) PALPATION:    Abdominal: Decreased movement of the lower rib cage, tightness in the diaphragm                External Perineal Exam: intact but dry                             Internal Pelvic Floor: tightness throughout the pelvic floor  Patient confirms identification and approves PT to assess internal pelvic floor and treatment Yes  PELVIC MMT:   MMT eval 04/18/23 05/25/23 06/08/23 06/15/23 07/04/23 07/20/23 07/27/23 08/29/23  Vaginal 1/5 2/5    2/5 for the second and third layer, 1/5 for the first layer 2/5 for all layers 2/5 with smallest lift when she breaths out to contract   Internal Anal Sphincter 1/5  3/5 weak hug 3/5 weak hug 3/5    3/5  External Anal Sphincter 1/5  3/5 weak hug 3/5 weak hug 3/5    3/5  Puborectalis 1/5  3/5 weak hug 3/5 weak hug 3/5    3/5  Diastasis Recti 1/5  N/a      3/5  (Blank rows = not tested)        TONE: increased  PROLAPSE: none  TODAY'S TREATMENT:  08/29/23 Neuromuscular re-education: Core facilitation: Supine hip flexion with yellow band and tactile cues to the abdomen to contract  correctly Bridge with hip abduction with yellow band  Supine knee flexion to extension with yellow band Pelvic floor contraction training: No emotional/communication barriers or cognitive limitation. Patient is motivated to learn. Patient understands and agrees with treatment goals and plan. PT explains patient will be examined in standing, sitting, and lying down to see how their muscles and joints work. When they are ready, they will be asked to remove their underwear so PT can examine their perineum. The patient is also given the option of providing their own chaperone as one is not provided in our facility. The patient also has the right and is explained the right to defer or refuse any part of the evaluation or treatment including the internal exam. With the patient's consent, PT will use one gloved finger to gently assess the muscles of the pelvic floor, seeing how well it contracts and relaxes and if there is muscle symmetry. After, the patient will get dressed and PT and patient will discuss exam findings and plan of care. PT and patient discuss plan of care, schedule, attendance policy and HEP activities.  Therapist gloved finger in the rectum working on rectal contraction without contracting the gluteals, quick contractions, bringing the puborectalis forward, slow contractions for 12 seconds Self-care: Educated patient on eating more insoluble fiber to bulk up her stool so she is not having leakage of the  stool    08/15/23 Exercises: Stretches/mobility: Piriformis stretch sitting holding 30 sec bil.  Karolynn pose with breathing into her back holding 1 min.  Cat cow 10 x Tried thread the needle but hurt her knee Strengthening: Supine clamshell with 2 dot theraloop 20 x with core engaged Supine with band around knees alternate shoulder and hip flexion with core engaged Bridge with hip abduction and band around the knees Standing hip 3 way with core engaged 15 x each Squat with pelvic floor  contraction 15 x with cues to not let knees go past toes Dead lift with core engaged 15 x    08/03/23 Manual: Soft tissue mobilization: Circular massage of the abdomen to work on tissue mobility from having a hysterectomy Scar tissue mobilization: Scar work along the umbilicus and the areas of the abdomen to reduce the restrictions and improve tissue mobility Myofascial release: Fascial release of the area around the ureters to work on urge to urinate and no urine comes out Fascial release around the umbilicus to reduce the tightness from the laparoscopic surgery Fascial release of the lower abdomen to reduce the restrictions along the area of hysterectomy and around the bladder to reduce the urgency     PATIENT EDUCATION:  08/15/23 Education details: Therapist educated patient on bladder irritants, how to fill out bladder diary, and urge to voidAccess Code: [email or web address] Person educated: Patient Education method: Explanation, Demonstration, Tactile cues, Verbal cues, and Handouts Education comprehension: verbalized understanding, returned demonstration, verbal cues required, tactile cues required, and needs further education  HOME EXERCISE PROGRAM: 08/15/23 Access Code: [email or web address] URL: [email or web address] Date: 08/15/2023 Prepared by: Channing Pereyra  Exercises - Seated Piriformis Stretch with Trunk Bend  - 1 x daily - 7 x weekly - 1 sets - 2 reps - 30 sec hold - Diaphragmatic Breathing in Child's Pose with Pelvic Floor Relaxation  - 1 x daily - 7 x weekly - 1 sets - 1 reps - 1 min hold - Cat Cow  - 1 x daily - 7 x weekly - 1 sets - 10 reps - Dead Bug  - 1 x daily - 3 x weekly - 2 sets - 10 reps - Bridge with Hip Abduction and Resistance  - 1 x daily - 3 x weekly - 3 sets - 10 reps - Hooklying Clamshell with Resistance  - 1 x daily - 3 x weekly - 2 sets - 10 reps - Squat  - 1 x daily - 3 x weekly - 1 sets - 10 reps - Half Deadlift with Kettlebell  - 1 x daily - 3 x weekly - 1  sets - 10 reps - Standing 3-Way Leg Reach with Resistance at Ankles and Counter Support  - 1 x daily - 3 x weekly - 1 sets - 15 reps  ASSESSMENT: CLINICAL IMPRESSION: Patient is a 68 y.o. female who was seen today for physical therapy  treatment for overactive bladder, levator spasm, and fecal incontinence. Patient had s/p Robotic assisted sacrocolpopexy, right salpingo- oophorectomy, cystoscopy on 11/08/22.  Patient has had no  urinary or fecal leakage since last visit.  Her HEP has been updated to challenge her pelvic floor and core. Patient has been shopping without going to the bathroom.  She was able to have the urge to have a bowel movement and make it to the bathroom in time. She had one time stool came out without her feeling it and one time she wiped and stool was present. Patient is going to work  on using insoluble fiber to increase stoll bulk. Patient will benefit from skilled therapy to improve pelvic floor coordination and strength to reduce leakage.   OBJECTIVE IMPAIRMENTS: decreased coordination, decreased ROM, decreased strength, increased fascial restrictions, and increased muscle spasms.   ACTIVITY LIMITATIONS: continence, toileting, and locomotion level  PARTICIPATION LIMITATIONS: driving, shopping, and community activity  PERSONAL FACTORS: Fitness and 1-2 comorbidities: s/p Robotic assisted sacrocolpopexy, right salpingo- oophorectomy, cystoscopy on 11/08/22 ; Appendectomy 1986; vaginal prolapse repair with hysterectomy 1986;  Left knee replacement are also affecting patient's functional outcome.   REHAB POTENTIAL: Excellent  CLINICAL DECISION MAKING: Evolving/moderate complexity  EVALUATION COMPLEXITY: Moderate   GOALS: Goals reviewed with patient? Yes  SHORT TERM GOALS: Target date: 06/02/23  Patient educated and understands what bladder irritants are and how they affect the bladder.  Baseline: Goal status: Met 04/11/23  2.  Patient educated on urge to void to reduce  the urgency.  Baseline:  Goal status: Met 04/11/23  3.  Patient is able to perform diaphragmatic breathing to relax the pelvic floor.  Baseline:  Goal status: Met 04/18/23   LONG TERM GOALS: Target date: 10/20/23  Patient independent with advanced HEP for core, pelvic floor and hip strength.  Baseline:  Goal status: Ongoing 07/27/23  2.  Patient is able to have the urge to void and walk to the bathroom without leaking urine due to increased in strength.  Baseline:  Goal status: met 06/08/23  3.  Patient is able to go out in public and not worry where the bathroom are due to her urgency decreased >/= 50%.  Baseline:  Goal status: Met 08/29/23  4.  Patient reports her fecal leakage has improved >/= 75% due to increased in pelvic floor strength >/= 3/5.  Baseline: Yesterday had leakage all day Goal status: ongoing 07/27/23   PLAN:  PT FREQUENCY: 1-2x/week  PT DURATION: 12 weeks  PLANNED INTERVENTIONS: 97110-Therapeutic exercises, 97530- Therapeutic activity, 97112- Neuromuscular re-education, 97535- Self Care, [id number]- Manual therapy, [id number]- Electrical stimulation (unattended), [phone]- Electrical stimulation (manual), 97035- Ultrasound, Patient/Family education, Dry Needling, Joint mobilization, Spinal mobilization, Cryotherapy, Moist heat, and Biofeedback  PLAN FOR NEXT SESSION:    update HEP, if doing well then can be discharged  Channing Pereyra, PT 08/29/23 2:19 PM

## 2023-09-07 ENCOUNTER — Encounter: Payer: Self-pay | Admitting: Physical Therapy

## 2023-09-07 DIAGNOSIS — M6281 Muscle weakness (generalized): Secondary | ICD-10-CM | POA: Diagnosis not present

## 2023-09-07 DIAGNOSIS — R278 Other lack of coordination: Secondary | ICD-10-CM

## 2023-09-07 NOTE — Therapy (Signed)
 OUTPATIENT PHYSICAL THERAPY FEMALE PELVIC TREATMENT   Patient Name: Olivia Paul MRN: [id number] DOB:12/30/1955, 68 y.o., female Today's Date: 09/07/2023     END OF SESSION:  PT End of Session - 09/07/23 1134     Visit Number 16    Date for PT Re-Evaluation 10/20/23    Authorization Type [employer] medicare    Authorization Time Period 6/16-9/8    Authorization - Visit Number 4    Authorization - Number of Visits 6    Progress Note Due on Visit 10    PT Start Time 1130    PT Stop Time 1215    PT Time Calculation (min) 45 min    Activity Tolerance Patient tolerated treatment well    Behavior During Therapy [facility] for tasks assessed/performed            Past Medical History:  Diagnosis Date   Allergy     Anxiety    Arthritis    lt knee   Asthma 2011   Complication of anesthesia    Fatty liver    GERD (gastroesophageal reflux disease)    Headache    sinus   History of COVID-19 10/11/2022   PONV (postoperative nausea and vomiting)    Pre-diabetes    Sleep apnea    cpap set on 4   Vitamin D  deficiency    Wears glasses    Past Surgical History:  Procedure Laterality Date   APPENDECTOMY  1986   BREAST BIOPSY     BREAST EXCISIONAL BIOPSY     CYSTOSCOPY N/A 11/08/2022   Procedure: CYSTOSCOPY;  Surgeon: Marilynne Rosaline SAILOR, MD;  Location: [facility] [place];  Service: Gynecology;  Laterality: N/A;   KNEE ARTHROSCOPY WITH MENISCAL REPAIR Left 02/21/2018   LIPOMA EXCISION     ROBOTIC ASSISTED LAPAROSCOPIC SACROCOLPOPEXY N/A 11/08/2022   Procedure: XI ROBOTIC ASSISTED LAPAROSCOPIC SACROCOLPOPEXY;  Surgeon: Marilynne Rosaline SAILOR, MD;  Location: [facility];  Service: Gynecology;  Laterality: N/A;   TOTAL KNEE ARTHROPLASTY Left 11/16/2018   Procedure: TOTAL KNEE ARTHROPLASTY;  Surgeon: Yvone Rush, MD;  Location: [facility];  Service: Orthopedics;  Laterality: Left;   total knee arthroplasty-revision Left 08/31/2020   Dr, Londa   TRIGGER  FINGER RELEASE Right 12/25/2020   VAGINAL HYSTERECTOMY  1986   VAGINAL PROLAPSE REPAIR     1986   XI ROBOTIC ASSISTED OOPHORECTOMY Right 11/08/2022   Procedure: XI ROBOTIC ASSISTED OOPHORECTOMY;  Surgeon: Marilynne Rosaline SAILOR, MD;  Location: [facility];  Service: Gynecology;  Laterality: Right;   Patient Active Problem List   Diagnosis Date Noted   Encounter for general adult medical examination w/o abnormal findings 05/16/2023   Elevated blood pressure reading 05/16/2023   Vaginal vault prolapse after hysterectomy 11/08/2022   COVID-19 10/18/2022   Cough in adult 10/18/2022   Pure hypercholesterolemia 05/22/2022   Prediabetes 05/22/2022   OSA on CPAP 05/22/2022   Mood disorder (HCC) 12/27/2021   Urinary retention 09/02/2021   Menopause 09/02/2021   OAB (overactive bladder) 09/02/2021   Daytime sleepiness 07/02/2021   Insomnia 07/02/2021   Episodic tension-type headache, not intractable 03/31/2021   Lumbar radiculopathy 01/29/2021   Acquired trigger finger of right middle finger 11/26/2020   Impingement syndrome of right shoulder region 10/22/2020   Cervical radiculopathy 10/16/2020   Dizziness 08/02/2020   Class 3 severe obesity due to excess calories with serious comorbidity and body mass index (BMI) of 40.0 to 44.9 in adult 08/02/2020   Family history of heart  disease 08/02/2020   Primary osteoarthritis of left knee 11/16/2018   Unilateral primary osteoarthritis, left knee 10/29/2018   Xanthelasma of right eyelid 03/26/2018   Anxiety 03/26/2018   Perennial allergic rhinitis 02/02/2018   Vitamin D  deficiency 07/27/2017   Moderate persistent asthma, uncomplicated 05/11/2017   Drug allergy  05/11/2017   Basal cell papilloma 03/20/2015   Changing skin lesion 01/13/2015   Asthma 11/05/2014   Allergic rhinoconjunctivitis 11/05/2014    PCP: Jarold Medici, MD  REFERRING PROVIDER: Marilynne Rosaline SAILOR, MD   REFERRING DIAG:  N32.81 (ICD-10-CM) - OAB  (overactive bladder)  [phone] (ICD-10-CM) - Levator spasm  R15.9 (ICD-10-CM) - Incontinence of feces, unspecified fecal incontinence type    THERAPY DIAG:  Muscle weakness (generalized)  Other lack of coordination  Rationale for Evaluation and Treatment: Rehabilitation  ONSET DATE: 1983  SUBJECTIVE:                                                                                                                                                                                           SUBJECTIVE STATEMENT: I am doing really good. I have had not accidents.    PAIN:  Are you having pain? No   PRECAUTIONS: None  RED FLAGS: None   WEIGHT BEARING RESTRICTIONS: No  FALLS:  Has patient fallen in last 6 months? Yes. Number of falls 1 time and slipped on ice not due to balance.  OCCUPATION: retired  ACTIVITY LEVEL : water  aerobics  PLOF: Independent  PATIENT GOALS: reduce urinary leakage, not know where every bathroom is in the store  PERTINENT HISTORY:  s/p Robotic assisted sacrocolpopexy, right salpingo- oophorectomy, cystoscopy on 11/08/22 ; Appendectomy 1986; vaginal prolapse repair with hysterectomy 1986;  Left knee replacement Sexual abuse: No  BOWEL MOVEMENT: Pain with bowel movement: No Type of bowel movement:Type (Bristol Stool Scale) Type 4 and Frequency 1-2 days Fully empty rectum: Yes:   Leakage: Yes: does not always realize she had stool leakage but has not had it in the last several weeks Fiber supplement/laxative Yes  and Metamucil  URINATION: Pain with urination: No Fully empty bladder: Yes:   Stream: Strong Urgency: Yes leaks when pulling her pants down Frequency: average Leakage: Urge to void, Walking to the bathroom, and during the night Pads: Yes: wears a pad when goes out for activity  INTERCOURSE: no activity  Pain with intercourse: none  PREGNANCY: Vaginal deliveries 2 Tearing No Episiotomy Yes   PROLAPSE: None   OBJECTIVE:  Note:  Objective measures were completed at Evaluation unless otherwise noted.  DIAGNOSTIC FINDINGS:  none  PATIENT SURVEYS:  PFIQ-7: 82; UIQ-7 43; CRAIQ-7 43 06/19/24: PFIQ-7:  61; UIQ-7 33; CRAIQ-7 29 07/13/23: PFIQ-7: 57; UIQ-7 29; CRAIQ-7 33 09/07/23: PFIQ-7: 0; UIQ-7 0; CRAIQ-7 0  COGNITION: Overall cognitive status: Within functional limits for tasks assessed     SENSATION: Light touch: Appears intact   POSTURE: rounded shoulders, forward head, and decreased lumbar lordosis   LUMBARAROM/PROM: Lumbar ROM decreased by 25%   LOWER EXTREMITY ROM:  Passive ROM Right eval Left eval Left 06/20/23  Hip external rotation 80 50 70   (Blank rows = not tested)  LOWER EXTREMITY MMT:  MMT Right eval Left eval Right 06/20/23 Left 06/20/23 Right 07/27/23 Left  07/27/23 Right 09/07/23 Left 09/07/23  Hip flexion 4/5 4/5 4+/5 4+/5 4+/5 4+/5 5/5 5/5  Hip extension 3/5 3/5 3+/5 4/5 3+/5 4/5 5/5 5/5  Hip abduction 3-/5 3/5 3+/5 3+/5 4/5 4/5 5/5 5/5   (Blank rows = not tested) PALPATION:    Abdominal: Decreased movement of the lower rib cage, tightness in the diaphragm                External Perineal Exam: intact but dry                             Internal Pelvic Floor: tightness throughout the pelvic floor  Patient confirms identification and approves PT to assess internal pelvic floor and treatment Yes  PELVIC MMT:   MMT eval 04/18/23 05/25/23 06/08/23 06/15/23 07/04/23 07/20/23 07/27/23 08/29/23  Vaginal 1/5 2/5    2/5 for the second and third layer, 1/5 for the first layer 2/5 for all layers 2/5 with smallest lift when she breaths out to contract   Internal Anal Sphincter 1/5  3/5 weak hug 3/5 weak hug 3/5    3/5  External Anal Sphincter 1/5  3/5 weak hug 3/5 weak hug 3/5    3/5  Puborectalis 1/5  3/5 weak hug 3/5 weak hug 3/5    3/5  Diastasis Recti 1/5  N/a      3/5  (Blank rows = not tested)        TONE: increased  PROLAPSE: none  TODAY'S TREATMENT:   09/07/23  Exercises: Strengthening: Supine with band around knees  and holding green band alternate shoulder and hip flexion with core engaged Bridge with hip abduction with red band and bilateral shoulder horizontal abduction holding green band Standing hip 3 way with core engaged 15 x each Squatting 10 x  Deadlift 15 x    08/29/23 Neuromuscular re-education: Core facilitation: Supine hip flexion with yellow band and tactile cues to the abdomen to contract correctly Bridge with hip abduction with yellow band  Supine knee flexion to extension with yellow band Pelvic floor contraction training: No emotional/communication barriers or cognitive limitation. Patient is motivated to learn. Patient understands and agrees with treatment goals and plan. PT explains patient will be examined in standing, sitting, and lying down to see how their muscles and joints work. When they are ready, they will be asked to remove their underwear so PT can examine their perineum. The patient is also given the option of providing their own chaperone as one is not provided in our facility. The patient also has the right and is explained the right to defer or refuse any part of the evaluation or treatment including the internal exam. With the patient's consent, PT will use one gloved finger to gently assess the muscles of the pelvic floor, seeing how well it contracts and relaxes and if there  is muscle symmetry. After, the patient will get dressed and PT and patient will discuss exam findings and plan of care. PT and patient discuss plan of care, schedule, attendance policy and HEP activities.  Therapist gloved finger in the rectum working on rectal contraction without contracting the gluteals, quick contractions, bringing the puborectalis forward, slow contractions for 12 seconds Self-care: Educated patient on eating more insoluble fiber to bulk up her stool so she is not having leakage of the stool     08/15/23 Exercises: Stretches/mobility: Piriformis stretch sitting holding 30 sec bil.  Karolynn pose with breathing into her back holding 1 min.  Cat cow 10 x Tried thread the needle but hurt her knee Strengthening: Supine clamshell with 2 dot theraloop 20 x with core engaged Supine with band around knees alternate shoulder and hip flexion with core engaged Bridge with hip abduction and band around the knees Standing hip 3 way with core engaged 15 x each Squat with pelvic floor contraction 15 x with cues to not let knees go past toes Dead lift with core engaged 15 x    PATIENT EDUCATION:  08/15/23 Education details: Therapist educated patient on bladder irritants, how to fill out bladder diary, and urge to voidAccess Code: [email or web address] Person educated: Patient Education method: Explanation, Demonstration, Tactile cues, Verbal cues, and Handouts Education comprehension: verbalized understanding, returned demonstration, verbal cues required, tactile cues required, and needs further education  HOME EXERCISE PROGRAM: 08/15/23 Access Code: [email or web address] URL: [email or web address] Date: 08/15/2023 Prepared by: Channing Pereyra  Exercises - Seated Piriformis Stretch with Trunk Bend  - 1 x daily - 7 x weekly - 1 sets - 2 reps - 30 sec hold - Diaphragmatic Breathing in Child's Pose with Pelvic Floor Relaxation  - 1 x daily - 7 x weekly - 1 sets - 1 reps - 1 min hold - Cat Cow  - 1 x daily - 7 x weekly - 1 sets - 10 reps - Dead Bug  - 1 x daily - 3 x weekly - 2 sets - 10 reps - Bridge with Hip Abduction and Resistance  - 1 x daily - 3 x weekly - 3 sets - 10 reps - Hooklying Clamshell with Resistance  - 1 x daily - 3 x weekly - 2 sets - 10 reps - Squat  - 1 x daily - 3 x weekly - 1 sets - 10 reps - Half Deadlift with Kettlebell  - 1 x daily - 3 x weekly - 1 sets - 10 reps - Standing 3-Way Leg Reach with Resistance at Ankles and Counter Support  - 1 x daily - 3 x weekly - 1 sets - 15  reps  ASSESSMENT: CLINICAL IMPRESSION: Patient is a 68 y.o. female who was seen today for physical therapy  treatment for overactive bladder, levator spasm, and fecal incontinence. Patient has not had any fecal or urinary leakage. She is not wearing a pad. She is staying active. Patient hip strength has increased to 5/5. She is independent with her HEP. She has increased pelvic floor strength. She is able to go out in public without worrying on finding a bathroom.   OBJECTIVE IMPAIRMENTS: decreased coordination, decreased ROM, decreased strength, increased fascial restrictions, and increased muscle spasms.   ACTIVITY LIMITATIONS: continence, toileting, and locomotion level  PARTICIPATION LIMITATIONS: driving, shopping, and community activity  PERSONAL FACTORS: Fitness and 1-2 comorbidities: s/p Robotic assisted sacrocolpopexy, right salpingo- oophorectomy, cystoscopy on 11/08/22 ; Appendectomy 1986; vaginal prolapse repair with hysterectomy 1986;  Left  knee replacement are also affecting patient's functional outcome.   REHAB POTENTIAL: Excellent  CLINICAL DECISION MAKING: Evolving/moderate complexity  EVALUATION COMPLEXITY: Moderate   GOALS: Goals reviewed with patient? Yes  SHORT TERM GOALS: Target date: 06/02/23  Patient educated and understands what bladder irritants are and how they affect the bladder.  Baseline: Goal status: Met 04/11/23  2.  Patient educated on urge to void to reduce the urgency.  Baseline:  Goal status: Met 04/11/23  3.  Patient is able to perform diaphragmatic breathing to relax the pelvic floor.  Baseline:  Goal status: Met 04/18/23   LONG TERM GOALS: Target date: 10/20/23  Patient independent with advanced HEP for core, pelvic floor and hip strength.  Baseline:  Goal status: met 09/07/23  2.  Patient is able to have the urge to void and walk to the bathroom without leaking urine due to increased in strength.  Baseline:  Goal status: met 06/08/23  3.   Patient is able to go out in public and not worry where the bathroom are due to her urgency decreased >/= 50%.  Baseline:  Goal status: Met 08/29/23  4.  Patient reports her fecal leakage has improved >/= 75% due to increased in pelvic floor strength >/= 3/5.  Baseline: Yesterday had leakage all day Goal status: met 09/07/23   PLAN: Discharge to HEP this visit    Channing Pereyra, PT 09/07/23 1:43 PM   PHYSICAL THERAPY DISCHARGE SUMMARY  Visits from Start of Care: 16  Current functional level related to goals / functional outcomes: See above   Remaining deficits: See above   Education / Equipment: HEP   Patient agrees to discharge. Patient goals were met. Patient is being discharged due to meeting the stated rehab goals. Thank you for the referral.   Channing Pereyra, PT 09/07/23 1:43 PM

## 2023-09-12 ENCOUNTER — Encounter: Payer: Self-pay | Admitting: Physical Therapy

## 2023-09-20 ENCOUNTER — Ambulatory Visit: Payer: Medicare Other | Admitting: Allergy

## 2023-10-02 ENCOUNTER — Encounter: Admitting: Physical Therapy

## 2023-10-10 ENCOUNTER — Encounter: Admitting: Physical Therapy

## 2023-10-31 ENCOUNTER — Encounter: Payer: Self-pay | Admitting: Obstetrics and Gynecology

## 2023-10-31 ENCOUNTER — Ambulatory Visit (INDEPENDENT_AMBULATORY_CARE_PROVIDER_SITE_OTHER): Admitting: Obstetrics and Gynecology

## 2023-10-31 VITALS — BP 126/81 | HR 76

## 2023-10-31 DIAGNOSIS — N3281 Overactive bladder: Secondary | ICD-10-CM | POA: Diagnosis not present

## 2023-10-31 DIAGNOSIS — R159 Full incontinence of feces: Secondary | ICD-10-CM | POA: Diagnosis not present

## 2023-10-31 NOTE — Progress Notes (Signed)
 [facility]  Date of Visit: 10/31/2023  History of Present Illness: [name] is a 69 y.o. female scheduled today for a post-operative visit.   Surgery: s/p Robotic assisted sacrocolpopexy, right salpingo- oophorectomy, cystoscopy on 11/08/22  Bowel leakage is unpredictable. Happens at least weekly but sometimes more.  Not consistent with the metamucil. PT did help with some of her symptoms. She does have an appt with her GI.   Sometimes when she wakes up she has some leakage on her pad. Gemtesa  does help with her urgency. She tried without the gemtesa  and noticed and increase in symptoms.    Medications: She has a current medication list which includes the following prescription(s): acetaminophen , albuterol , albuterol , vitamin c, atorvastatin , breo ellipta , cetirizine , vitamin d , clobetasol  cream, duloxetine , fexofenadine , gabapentin , hydroxyzine , ipratropium, ipratropium-albuterol , omega-3, magnesium  oxide, meclizine , menthol-methyl salicylate, montelukast , pantoprazole , tramadol , turmeric, vitamin e, zonisamide, epinephrine , tizanidine , triamcinolone  cream, gemtesa , and vitamin k.   Allergies: Patient is allergic to meloxicam, nsaids, sulfa antibiotics, cat dander, pollen extract, and erythromycin.   Physical Exam: BP 126/81 (BP Location: Left Arm, Patient Position: Sitting, Cuff Size: Large)   Pulse 76   deferred  ---------------------------------------------------------  Assessment and Plan:  1. Overactive bladder   2. Incontinence of feces, unspecified fecal incontinence type      - Restart Metamucil daily for stool bulking and prevention of constipation. - Continue with Gemtesa  daily - For refractory OAB we reviewed the procedure for intravesical Botox injection with cystoscopy in the office and reviewed the risks, benefits and alternatives of treatment including but not limited to infection, need for self-catheterization and need for repeat therapy.  We discussed  that there is a 5-15% chance of needing to catheterize with Botox and that this usually resolves in a few months; however can persist for longer periods of time.  Typically Botox injections would need to be repeated every 3-12 months since this is not a permanent therapy.  - We discussed the role of sacral neuromodulation and how it works. It requires a test phase, and documentation of bladder function via diary. After a successful test period, a permanent wire and generator are placed in the OR. The battery lasts 10-15 years on average and would need to be replaced surgically.  The goal of this therapy is at least a 50% improvement in symptoms. It is NOT realistic to expect a 100% cure.  We reviewed the fact that about 30% of patients fail the test phase and are not candidates for permanent generator placement.   - We also discussed the role of percutaneous tibial nerve stimulation and how it works.  She understands it requires 12 weekly visits for temporary neuromodulation of the sacral nerve roots via the tibial nerve and that she may then require continued tapered treatment.   - She prefers a sacral nerve stimulator. Discussed options of trial in the office vs OR and she wants to proceed with PNE in office. Bladder diaries and information for the medtronic device provided.    Olivia LOISE Caper, MD

## 2023-11-03 ENCOUNTER — Ambulatory Visit: Admitting: Obstetrics and Gynecology

## 2023-11-08 ENCOUNTER — Ambulatory Visit: Admitting: Obstetrics and Gynecology

## 2023-11-15 ENCOUNTER — Encounter: Payer: Self-pay | Admitting: Internal Medicine

## 2023-11-15 ENCOUNTER — Ambulatory Visit: Payer: Self-pay | Admitting: Internal Medicine

## 2023-11-15 VITALS — BP 110/78 | HR 82 | Temp 98.1°F | Ht 59.0 in | Wt 204.0 lb

## 2023-11-15 DIAGNOSIS — R7303 Prediabetes: Secondary | ICD-10-CM | POA: Diagnosis not present

## 2023-11-15 DIAGNOSIS — R159 Full incontinence of feces: Secondary | ICD-10-CM

## 2023-11-15 DIAGNOSIS — E7800 Pure hypercholesterolemia, unspecified: Secondary | ICD-10-CM

## 2023-11-15 DIAGNOSIS — R030 Elevated blood-pressure reading, without diagnosis of hypertension: Secondary | ICD-10-CM

## 2023-11-15 DIAGNOSIS — E66813 Obesity, class 3: Secondary | ICD-10-CM

## 2023-11-15 DIAGNOSIS — R42 Dizziness and giddiness: Secondary | ICD-10-CM | POA: Diagnosis not present

## 2023-11-15 DIAGNOSIS — Z23 Encounter for immunization: Secondary | ICD-10-CM

## 2023-11-15 DIAGNOSIS — R152 Fecal urgency: Secondary | ICD-10-CM

## 2023-11-15 DIAGNOSIS — Z6841 Body Mass Index (BMI) 40.0 and over, adult: Secondary | ICD-10-CM

## 2023-11-15 MED ORDER — COVID-19 MRNA VAC-TRIS(PFIZER) 30 MCG/0.3ML IM SUSY: 0.3000 mL | PREFILLED_SYRINGE | Freq: Once | INTRAMUSCULAR | 0 refills | Status: AC

## 2023-11-15 MED ORDER — MECLIZINE HCL 25 MG PO TABS: 25.0000 mg | ORAL_TABLET | Freq: Three times a day (TID) | ORAL | 0 refills | Status: AC | PRN

## 2023-11-15 NOTE — Progress Notes (Signed)
 I,Victoria T Emmitt, CMA,acting as a neurosurgeon for Catheryn LOISE Slocumb, MD.,have documented all relevant documentation on the behalf of Catheryn LOISE Slocumb, MD,as directed by  Catheryn LOISE Slocumb, MD while in the presence of Catheryn LOISE Slocumb, MD.  Subjective:  Patient ID: Olivia Paul , female    DOB: Jun 21, 1955 , 68 y.o.   MRN: [id number]  Chief Complaint  Patient presents with   Hypertension    Patient presents today for prediabetes & cholesterol check, patient reports compliance with medications. She denies having any headaches, chest pain and shortness of breath.   Prediabetes   Hyperlipidemia    HPI Discussed the use of AI scribe software for clinical note transcription with the patient, who gave verbal consent to proceed.  History of Present Illness Olivia Paul is a 68 year old female who presents for f/u of elevated blood pressure and prediabetes with episodes of lightheadedness and ongoing bowel leakage.  She experiences episodes of lightheadedness that occur suddenly without identifiable triggers.   There is no associated headache, cp, sob or palpitations.   She is experiencing ongoing bowel leakage, which she refers to as 'that bowel thing.' She has consulted with a specialist regarding this issue, and a treatment involving Botox vs. Sacral neuromodulation.   Her current medications include atorvastatin , duloxetine , fexofenadine , cetirizine , montelukast , pantoprazole , and Breo. She is also using an EpiPen  and meclizine . She mentions being up to date on her atorvastatin  and duloxetine , and her allergy  medications are managed by a specialist.   Past Medical History:  Diagnosis Date   Allergy     Anxiety    Arthritis    lt knee   Asthma 2011   Complication of anesthesia    Fatty liver    GERD (gastroesophageal reflux disease)    Headache    sinus   History of COVID-19 10/11/2022   PONV (postoperative nausea and vomiting)    Pre-diabetes    Sleep apnea    cpap set on 4    Urticaria    Vitamin D  deficiency    Wears glasses      Family History  Problem Relation Age of Onset   Kidney disease Mother    Hypertension Mother    Allergic rhinitis Mother    Heart disease Father    Allergic rhinitis Father    Asthma Father    Urticaria Sister    Breast cancer Paternal Grandmother    Cancer Paternal Grandmother    COPD Paternal Grandmother    Breast cancer Maternal Aunt    Cancer Maternal Aunt    COPD Maternal Aunt    Hypertension Maternal Aunt    Breast cancer Paternal Aunt    Anxiety disorder Daughter    Depression Daughter    Asthma Son    Asthma Sister    Cancer Sister    Hypertension Sister    Cancer Maternal Aunt    Cancer Maternal Aunt    Cancer Maternal Uncle    Hypertension Maternal Uncle    Cancer Maternal Uncle    Cancer Maternal Uncle    Hypertension Maternal Uncle    Cancer Maternal Uncle    COPD Maternal Uncle    Depression Maternal Uncle    COPD Paternal Uncle    Hypertension Sister    Hypertension Brother    Angioedema Neg Hx    Atopy Neg Hx    Eczema Neg Hx    Immunodeficiency Neg Hx      Current Outpatient Medications:  acetaminophen  (TYLENOL ) 500 MG tablet, Take 1 tablet (500 mg total) by mouth every 6 (six) hours as needed (pain)., Disp: 30 tablet, Rfl: 0   albuterol  (PROVENTIL ) (2.5 MG/3ML) 0.083% nebulizer solution, Use 1 vial every 4-6 hours as needed for cough, wheeze, shortness of breath or chest tightness, Disp: 150 mL, Rfl: 0   albuterol  (VENTOLIN  HFA) 108 (90 Base) MCG/ACT inhaler, Inhale 2 puffs into the lungs every 4 (four) hours as needed for wheezing or shortness of breath., Disp: 18 g, Rfl: 1   Ascorbic Acid (VITAMIN C) 100 MG tablet, Take 100 mg by mouth daily., Disp: , Rfl:    atorvastatin  (LIPITOR) 20 MG tablet, TAKE 1 TABLET BY MOUTH EVERY DAY, Disp: 90 tablet, Rfl: 1   cetirizine  (ZYRTEC ) 10 MG tablet, Take 1 tablet (10 mg total) by mouth daily., Disp: 30 tablet, Rfl: 5   Cholecalciferol (VITAMIN  D) 50 MCG (2000 UT) CAPS, Take 5,000 Units by mouth daily. , Disp: , Rfl:    clobetasol  cream (TEMOVATE ) 0.05 %, Apply topically 2 (TWO) times a day as needed to areas of ras, Disp: 60 g, Rfl: 1   DULoxetine  (CYMBALTA ) 60 MG capsule, TAKE 1 CAPSULE BY MOUTH EVERY DAY, Disp: 90 capsule, Rfl: 2   EPINEPHrine  0.3 mg/0.3 mL IJ SOAJ injection, Inject 0.3 mLs (0.3 mg total) into the muscle as needed for anaphylaxis., Disp: 1 each, Rfl: 1   fexofenadine  (ALLEGRA ) 180 MG tablet, Take 1 tablet by mouth daily as needed for for allergy  symptoms. Take an extra tablet as needed for hive flare., Disp: 90 tablet, Rfl: 1   gabapentin  (NEURONTIN ) 300 MG capsule, as needed., Disp: , Rfl:    hydrOXYzine  (ATARAX ) 25 MG tablet, TAKE 1 TABLET BY MOUTH AT BEDTIME AS NEEDED FOR ITCHING., Disp: 30 tablet, Rfl: 0   ipratropium-albuterol  (DUONEB) 0.5-2.5 (3) MG/3ML SOLN, Inhale 3 mLs into the lungs daily as needed., Disp: , Rfl:    Krill Oil (OMEGA-3) 500 MG CAPS, Take 500 mg by mouth daily., Disp: , Rfl:    magnesium  oxide (MAG-OX) 400 MG tablet, Take 400 mg by mouth daily., Disp: , Rfl:    meclizine  (ANTIVERT ) 25 MG tablet, Take 1 tablet (25 mg total) by mouth 3 (three) times daily as needed for dizziness., Disp: 30 tablet, Rfl: 0   Menthol-Methyl Salicylate (TIGER BALM LINIMENT EX), Apply 1 application topically at bedtime as needed (pain)., Disp: , Rfl:    pantoprazole  (PROTONIX ) 40 MG tablet, TAKE 1 TABLET BY MOUTH EVERY DAY, Disp: 90 tablet, Rfl: 1   tiZANidine  (ZANAFLEX ) 4 MG tablet, Take 4 mg by mouth every 8 (eight) hours as needed., Disp: , Rfl:    traMADol  (ULTRAM ) 50 MG tablet, Take 1 tablet (50 mg total) by mouth every 8 (eight) hours as needed for moderate pain (pain score 4-6)., Disp: 20 tablet, Rfl: 0   triamcinolone  cream (KENALOG ) 0.1 %, APPLY TO AFFECTED AREA TWICE DAILY AS NEEDED, Disp: 453.6 g, Rfl: 1   TURMERIC PO, Take by mouth as needed., Disp: , Rfl:    Vibegron  (GEMTESA ) 75 MG TABS, Take 1 tablet (75  mg total) by mouth daily., Disp: 30 tablet, Rfl: 11   vitamin E 400 UNIT capsule, Take 400 Units by mouth daily., Disp: , Rfl:    zonisamide (ZONEGRAN) 50 MG capsule, Take 100 mg by mouth at bedtime., Disp: , Rfl:    BREO ELLIPTA  200-25 MCG/ACT AEPB, Inhale 1 puff into the lungs daily., Disp: 60 each, Rfl: 5  ipratropium (ATROVENT ) 0.06 % nasal spray, 1-2 sprays each nostril up to 3-4 times a day as needed for nasal drainage or congestion, Disp: 15 mL, Rfl: 5   lidocaine  (LIDODERM ) 5 %, Place 1 patch onto the skin daily., Disp: , Rfl:    montelukast  (SINGULAIR ) 10 MG tablet, Take 1 tablet (10 mg total) by mouth at bedtime., Disp: 30 tablet, Rfl: 5   predniSONE  (DELTASONE ) 5 MG tablet, Take 5 mg by mouth daily with breakfast., Disp: , Rfl:    vitamin k 100 MCG tablet, Take 100 mcg by mouth daily., Disp: , Rfl:    Allergies  Allergen Reactions   Meloxicam Other (See Comments)    Bruised her legs all over     Nsaids Other (See Comments)    Scarring and bruising of legs   Sulfa Antibiotics Other (See Comments)    Mouth broke out in sores   Cat Dander    Other Other (See Comments)   Oxycodone  Dermatitis   Pollen Extract    Erythromycin Nausea And Vomiting     Review of Systems  Constitutional: Negative.   Respiratory: Negative.    Cardiovascular: Negative.   Gastrointestinal: Negative.   Neurological:  Positive for light-headedness.  Psychiatric/Behavioral: Negative.       Today's Vitals   11/15/23 1411  BP: 110/78  Pulse: 82  Temp: 98.1 F (36.7 C)  SpO2: 98%  Weight: 204 lb (92.5 kg)  Height: 4' 11 (1.499 m)   Body mass index is 41.2 kg/m.  Wt Readings from Last 3 Encounters:  11/17/23 205 lb (93 kg)  11/15/23 204 lb (92.5 kg)  05/30/23 204 lb (92.5 kg)    BP Readings from Last 3 Encounters:  11/17/23 118/73  11/15/23 110/78  10/31/23 126/81    Objective:  Physical Exam Vitals and nursing note reviewed.  Constitutional:      Appearance: Normal appearance.  She is obese.  HENT:     Head: Normocephalic and atraumatic.  Eyes:     Extraocular Movements: Extraocular movements intact.  Cardiovascular:     Rate and Rhythm: Normal rate and regular rhythm.     Heart sounds: Normal heart sounds.  Pulmonary:     Effort: Pulmonary effort is normal.     Breath sounds: Normal breath sounds.  Musculoskeletal:     Cervical back: Normal range of motion.  Skin:    General: Skin is warm.  Neurological:     General: No focal deficit present.     Mental Status: She is alert.  Psychiatric:        Mood and Affect: Mood normal.        Behavior: Behavior normal.       Assessment And Plan:  Elevated blood pressure reading Assessment & Plan: BP has now improved. Will continue to monitor at each visit. - Follow low sodium diet.    Prediabetes Assessment & Plan: Previous labs reviewed, her A1c has been elevated in the past. I will check an A1c today. Reminded to avoid refined sugars including sugary drinks/foods and processed meats including bacon, sausages and deli meats.    Orders: -     CMP14+EGFR -     Hemoglobin A1c  Pure hypercholesterolemia Assessment & Plan: Chronic, she is currently on atorvastatin  20mg  daily. Encouraged to follow a heart healthy lifestyle.   Orders: -     CMP14+EGFR  Lightheadedness Assessment & Plan: Episodes occur without clear precipitating factors, with sudden onset and severe, distressing symptoms. - Advise to  maintain hydration, especially during flights.  Orders: -     CBC -     CMP14+EGFR  Incontinence of feces with fecal urgency Assessment & Plan: Continues to experience bowel leakage. Consulted with Dr. Marilynne, who plans treatment involving sacral neuromodulation - Review Dr. Susen notes for treatment details.   Class 3 severe obesity due to excess calories with serious comorbidity and body mass index (BMI) of 40.0 to 44.9 in adult [facility]) Assessment & Plan: BMI 41.  She is encouraged to  strive to lose ten percent of her body weight to decrease cardiac risk.    Immunization due -     Flu vaccine HIGH DOSE PF(Fluzone Trivalent)  Other orders -     Meclizine  HCl; Take 1 tablet (25 mg total) by mouth 3 (three) times daily as needed for dizziness.  Dispense: 30 tablet; Refill: 0 -     COVID-19 mRNA Vac-TriS(Pfizer); Inject 0.3 mLs into the muscle once for 1 dose.  Dispense: 0.3 mL; Refill: 0     Return for 4 month predm f/u.  Patient was given opportunity to ask questions. Patient verbalized understanding of the plan and was able to repeat key elements of the plan. All questions were answered to their satisfaction.   I, Catheryn LOISE Slocumb, MD, have reviewed all documentation for this visit. The documentation on 11/15/23 for the exam, diagnosis, procedures, and orders are all accurate and complete.   IF YOU HAVE BEEN REFERRED TO A SPECIALIST, IT MAY TAKE 1-2 WEEKS TO SCHEDULE/PROCESS THE REFERRAL. IF YOU HAVE NOT HEARD FROM US /SPECIALIST IN TWO WEEKS, PLEASE GIVE US  A CALL AT [phone].   THE PATIENT IS ENCOURAGED TO PRACTICE SOCIAL DISTANCING DUE TO THE COVID-19 PANDEMIC.

## 2023-11-15 NOTE — Patient Instructions (Signed)
 Prediabetes: What to Know Prediabetes is when your blood sugar, also called glucose, is at a higher level than normal but not high enough for you to be diagnosed with type 2 diabetes (type 2 diabetes mellitus). Having prediabetes puts you at risk for getting type 2 diabetes. By making some healthy changes, you may be able to prevent or delay getting type 2 diabetes. This is important because type 2 diabetes can lead to serious problems. Some of these include: Heart disease. Stroke. Blindness. Kidney disease. Depression. Poor blood flow in the feet and legs. In very bad cases, this could lead to having a leg removed by surgery (amputation). What are the causes? The exact cause of prediabetes isn't known. It may result from insulin resistance. Insulin resistance happens when cells in the body don't respond properly to insulin that the body makes. This can cause too much sugar to build up in the blood. High blood sugar, also called hyperglycemia, can develop. What increases the risk? Having a family member with type 2 diabetes. Being older than 67 years of age. Having had a temporary form of diabetes during a pregnancy. This is called gestational diabetes. Having had polycystic ovary syndrome (PCOS). Being overweight or obese. Being inactive and not getting much exercise. Having a history of heart disease. This may include problems with cholesterol levels, high levels of blood fats, or high blood pressure. What are the signs or symptoms? You may have no symptoms. If you do have symptoms, they may include: Increased hunger. Increased thirst. Needing to pee more often. Changes in how you see, like blurry vision. Feeling tired. How is this diagnosed? Prediabetes can be diagnosed with blood tests that check your blood sugar. One or more of these tests may be done: A fasting blood glucose (FBG) test. You won't be allowed to eat (you will fast) for at least 8 hours before a blood sample is  taken. An A1C blood test, also called a hemoglobin A1C test. This test shows information about blood sugar levels over the past 2?3 months. An oral glucose tolerance test (OGTT). This test measures your blood sugar at two points in time: After you haven't eaten for a while. This is your baseline level. Two hours after you drink a beverage that has sugar in it. You may be diagnosed with prediabetes if: Your FBG is 100?125 mg/dL (1.6-1.0 mmol/L). Your A1C level is 5.7?6.4% (39-46 mmol/mol). Your OGTT result is 140?199 mg/dL (9.6-04 mmol/L). These blood tests may need to be done again to be sure of the diagnosis. How is this treated? Treatment may include making changes to your diet and lifestyle. These changes can help lower your blood sugar and keep you from getting type 2 diabetes. In some cases, medicine may be given to help lower your risk. Follow these instructions at home: Eating and drinking  Eat and drink as told. Follow a healthy meal plan. This includes eating lean proteins, whole grains, legumes, fresh fruits and vegetables, low-fat dairy products, and healthy fats. Meet with an expert in healthy eating called a dietitian. This person can help create a healthy eating plan that's right for you. Lifestyle Do moderate-intensity exercise. Do this for at least 30 minutes a day on 5 or more days each week, or as told by your health care provider. A mix of activities may be best. Good choices include brisk walking, swimming, biking, and weight lifting. Try to lose weight if your provider says it's OK. Losing 5-7% of your body weight can  help reverse insulin resistance. Do not drink alcohol if: Your provider tells you not to drink. You're pregnant, may be pregnant, or plan to become pregnant. If you drink alcohol: Limit how much you have to: 0-1 drink a day if you're female. 0-2 drinks a day if you're female. Know how much alcohol is in your drink. In the [place] one drink is one 12 oz  bottle of beer (355 mL), one 5 oz glass of wine (148 mL), or one 1 oz glass of hard liquor (44 mL). General instructions Take medicines only as told. You may be given medicines that help lower the risk of type 2 diabetes. Do not smoke, vape, or use nicotine or tobacco. Where to find more information American Diabetes Association: diabetes.org/about-diabetes/prediabetes Academy of Nutrition and Dietetics: eatright.org American Heart Association: Go to ThisJobs.cz. Click the search icon. Type "prediabetes" in the search box. Contact a health care provider if: You have any of these symptoms: Increased hunger. Peeing more often than usual. Increased thirst. Feeling tired. Changes in how you see, like blurry vision. Feeling like you may throw up. Throwing up. Get help right away if: You have shortness of breath. You feel confused. This information is not intended to replace advice given to you by your health care provider. Make sure you discuss any questions you have with your health care provider. Document Revised: 09/04/2022 Document Reviewed: 09/04/2022 Elsevier Patient Education  2024 [employer].

## 2023-11-16 ENCOUNTER — Ambulatory Visit: Payer: Self-pay | Admitting: Internal Medicine

## 2023-11-16 LAB — CBC
Hematocrit: 42.8 % (ref 34.0–46.6)
Hemoglobin: 13.5 g/dL (ref 11.1–15.9)
MCH: 27.4 pg (ref 26.6–33.0)
MCHC: 31.5 g/dL (ref 31.5–35.7)
MCV: 87 fL (ref 79–97)
Platelets: 433 x10E3/uL (ref 150–450)
RBC: 4.92 x10E6/uL (ref 3.77–5.28)
RDW: 14.6 % (ref 11.7–15.4)
WBC: 10.3 x10E3/uL (ref 3.4–10.8)

## 2023-11-16 LAB — CMP14+EGFR
ALT: 14 IU/L (ref 0–32)
AST: 17 IU/L (ref 0–40)
Albumin: 4.1 g/dL (ref 3.9–4.9)
Alkaline Phosphatase: 161 IU/L — ABNORMAL HIGH (ref 49–135)
BUN/Creatinine Ratio: 15 (ref 12–28)
BUN: 11 mg/dL (ref 8–27)
Bilirubin Total: 0.5 mg/dL (ref 0.0–1.2)
CO2: 23 mmol/L (ref 20–29)
Calcium: 9.6 mg/dL (ref 8.7–10.3)
Chloride: 104 mmol/L (ref 96–106)
Creatinine, Ser: 0.72 mg/dL (ref 0.57–1.00)
Globulin, Total: 2.4 g/dL (ref 1.5–4.5)
Glucose: 79 mg/dL (ref 70–99)
Potassium: 4.4 mmol/L (ref 3.5–5.2)
Sodium: 143 mmol/L (ref 134–144)
Total Protein: 6.5 g/dL (ref 6.0–8.5)
eGFR: 92 mL/min/1.73 (ref >59)

## 2023-11-16 LAB — HEMOGLOBIN A1C
Est. average glucose Bld gHb Est-mCnc: 126 mg/dL
Hgb A1c MFr Bld: 6 % — ABNORMAL HIGH (ref 4.8–5.6)

## 2023-11-17 ENCOUNTER — Other Ambulatory Visit: Payer: Self-pay

## 2023-11-17 ENCOUNTER — Ambulatory Visit (INDEPENDENT_AMBULATORY_CARE_PROVIDER_SITE_OTHER): Admitting: Allergy

## 2023-11-17 ENCOUNTER — Encounter: Payer: Self-pay | Admitting: Allergy

## 2023-11-17 VITALS — BP 118/73 | HR 84 | Temp 97.3°F | Ht 59.0 in | Wt 205.0 lb

## 2023-11-17 DIAGNOSIS — J4540 Moderate persistent asthma, uncomplicated: Secondary | ICD-10-CM

## 2023-11-17 DIAGNOSIS — H1013 Acute atopic conjunctivitis, bilateral: Secondary | ICD-10-CM | POA: Diagnosis not present

## 2023-11-17 DIAGNOSIS — J3089 Other allergic rhinitis: Secondary | ICD-10-CM | POA: Diagnosis not present

## 2023-11-17 DIAGNOSIS — L508 Other urticaria: Secondary | ICD-10-CM

## 2023-11-17 MED ORDER — BREO ELLIPTA 200-25 MCG/ACT IN AEPB: 1.0000 | INHALATION_SPRAY | Freq: Every day | RESPIRATORY_TRACT | 5 refills | Status: AC

## 2023-11-17 MED ORDER — MONTELUKAST SODIUM 10 MG PO TABS: 10.0000 mg | ORAL_TABLET | Freq: Every day | ORAL | 5 refills | Status: AC

## 2023-11-17 MED ORDER — IPRATROPIUM BROMIDE 0.06 % NA SOLN: NASAL | 5 refills | Status: AC

## 2023-11-17 NOTE — Progress Notes (Signed)
 Follow-up Note  RE: Olivia Paul MRN: [id number] DOB: February 09, 1956 Date of Office Visit: 11/17/2023   History of present illness: Olivia Paul is a 68 y.o. female presenting today for follow-up of urticaria, asthma, allergic rhinitis. She was last seen in the office on 03/23/2023 by myself. Discussed the use of AI scribe software for clinical note transcription with the patient, who gave verbal consent to proceed.  She received her flu vaccine earlier this week in is wondering when would be best to receive her COVID-vaccine.  She experienced a recent episode of hives about a month ago, which she managed with topical steroid and her antihistamine regimen which includes either Allegra  or Zyrtec  with Pepcid  for hives. She has intermittent itchiness and hives without a clear trigger, suspecting allergens.  She has a history of respiratory issues, including wheezing, which she attributes to inconsistent use of her prescribed Breo inhaler. She uses it only when she notices symptoms like wheezing or difficulty breathing, especially when climbing stairs. She recalls a previous episode from March that was treated as bronchitis after an urgent care visit, where she received a chest x-ray and was prescribed doxycycline and nebulizer treatments.  She is currently prescribed Breo but takes it inconsistently, about once or twice a week. She uses montelukast  more regularly but still not daily.  She states she does take the montelukast  especially when experiencing headaches, and believes it helps with her symptoms. She also typically will use the nasal spray as well.  She does state that she plans to be more consistent with doing her daily medications which will include the Breo and the montelukast .  She has upcoming travel plans to [place] , the [place], and a [place] cruise. She is involved in community activities.     Review of systems: 10pt ROS negative unless noted above in HPI  Past  medical/social/surgical/family history have been reviewed and are unchanged unless specifically indicated below.  No changes  Medication List: Current Outpatient Medications  Medication Sig Dispense Refill   acetaminophen  (TYLENOL ) 500 MG tablet Take 1 tablet (500 mg total) by mouth every 6 (six) hours as needed (pain). 30 tablet 0   albuterol  (PROVENTIL ) (2.5 MG/3ML) 0.083% nebulizer solution Use 1 vial every 4-6 hours as needed for cough, wheeze, shortness of breath or chest tightness 150 mL 0   albuterol  (VENTOLIN  HFA) 108 (90 Base) MCG/ACT inhaler Inhale 2 puffs into the lungs every 4 (four) hours as needed for wheezing or shortness of breath. 18 g 1   Ascorbic Acid (VITAMIN C) 100 MG tablet Take 100 mg by mouth daily.     atorvastatin  (LIPITOR) 20 MG tablet TAKE 1 TABLET BY MOUTH EVERY DAY 90 tablet 1   BREO ELLIPTA  200-25 MCG/ACT AEPB Inhale 1 puff into the lungs daily. 60 each 5   cetirizine  (ZYRTEC ) 10 MG tablet Take 1 tablet (10 mg total) by mouth daily. 30 tablet 5   Cholecalciferol (VITAMIN D ) 50 MCG (2000 UT) CAPS Take 5,000 Units by mouth daily.      clobetasol  cream (TEMOVATE ) 0.05 % Apply topically 2 (TWO) times a day as needed to areas of ras 60 g 1   DULoxetine  (CYMBALTA ) 60 MG capsule TAKE 1 CAPSULE BY MOUTH EVERY DAY 90 capsule 2   EPINEPHrine  0.3 mg/0.3 mL IJ SOAJ injection Inject 0.3 mLs (0.3 mg total) into the muscle as needed for anaphylaxis. 1 each 1   fexofenadine  (ALLEGRA ) 180 MG tablet Take 1 tablet by mouth daily as needed  for for allergy  symptoms. Take an extra tablet as needed for hive flare. 90 tablet 1   gabapentin  (NEURONTIN ) 300 MG capsule as needed.     hydrOXYzine  (ATARAX ) 25 MG tablet TAKE 1 TABLET BY MOUTH AT BEDTIME AS NEEDED FOR ITCHING. 30 tablet 0   ipratropium (ATROVENT ) 0.06 % nasal spray 1-2 sprays each nostril up to 3-4 times a day as needed for nasal drainage or congestion 15 mL 5   ipratropium-albuterol  (DUONEB) 0.5-2.5 (3) MG/3ML SOLN Inhale 3  mLs into the lungs daily as needed.     Krill Oil (OMEGA-3) 500 MG CAPS Take 500 mg by mouth daily.     magnesium  oxide (MAG-OX) 400 MG tablet Take 400 mg by mouth daily.     meclizine  (ANTIVERT ) 25 MG tablet Take 1 tablet (25 mg total) by mouth 3 (three) times daily as needed for dizziness. 30 tablet 0   Menthol-Methyl Salicylate (TIGER BALM LINIMENT EX) Apply 1 application topically at bedtime as needed (pain).     montelukast  (SINGULAIR ) 10 MG tablet Take 1 tablet (10 mg total) by mouth at bedtime. 30 tablet 5   pantoprazole  (PROTONIX ) 40 MG tablet TAKE 1 TABLET BY MOUTH EVERY DAY 90 tablet 1   predniSONE  (DELTASONE ) 5 MG tablet Take 5 mg by mouth daily with breakfast.     tiZANidine  (ZANAFLEX ) 4 MG tablet Take 4 mg by mouth every 8 (eight) hours as needed.     traMADol  (ULTRAM ) 50 MG tablet Take 1 tablet (50 mg total) by mouth every 8 (eight) hours as needed for moderate pain (pain score 4-6). 20 tablet 0   triamcinolone  cream (KENALOG ) 0.1 % APPLY TO AFFECTED AREA TWICE DAILY AS NEEDED 453.6 g 1   TURMERIC PO Take by mouth as needed.     Vibegron  (GEMTESA ) 75 MG TABS Take 1 tablet (75 mg total) by mouth daily. 30 tablet 11   vitamin E 400 UNIT capsule Take 400 Units by mouth daily.     vitamin k 100 MCG tablet Take 100 mcg by mouth daily.     zonisamide (ZONEGRAN) 50 MG capsule Take 100 mg by mouth at bedtime.     lidocaine  (LIDODERM ) 5 % Place 1 patch onto the skin daily.     No current facility-administered medications for this visit.     Known medication allergies: Allergies  Allergen Reactions   Meloxicam Other (See Comments)    Bruised her legs all over     Nsaids Other (See Comments)    Scarring and bruising of legs   Sulfa Antibiotics Other (See Comments)    Mouth broke out in sores   Cat Dander    Other Other (See Comments)   Oxycodone  Dermatitis   Pollen Extract    Erythromycin Nausea And Vomiting     Physical examination: Blood pressure 118/73, pulse 84,  temperature (!) 97.3 F (36.3 C), temperature source Oral, height 4' 11 (1.499 m), weight 205 lb (93 kg), SpO2 98%.  General: Alert, interactive, in no acute distress. HEENT: PERRLA, TMs pearly gray, turbinates non-edematous without discharge, post-pharynx non erythematous. Neck: Supple without lymphadenopathy. Lungs: Clear to auscultation without wheezing, rhonchi or rales. {no increased work of breathing. CV: Normal S1, S2 without murmurs. Abdomen: Nondistended, nontender. Skin: Warm and dry, without lesions or rashes. Extremities:  No clubbing, cyanosis or edema. Neuro:   Grossly intact.  Diagnostics/Labs: MRI head w/o 07/01/23 FINDINGS:  Patchy periventricular and deep white matter T2/FLAIR hyperintensities are nonspecific but compatible with mild chronic  microangiopathic changes. No abnormally restricted diffusion to suggest acute or subacute infarction. No mass or mass effect. No abnormal extra-axial fluid collection.   No hydrocephalus. The larger intracranial vessels demonstrate normal flow voids.   The included paranasal sinuses are predominantly clear. The mastoid air cells are clear. Normal marrow signal pattern.    IMPRESSION:  1. No acute intracranial abnormality.   Spirometry: FEV1: 1.68L 98%, FVC: 2.03L 94%, ratio consistent with nonobstructive pattern  Assessment and plan: Urticaria, chronic - etiology of hives likely with a allergen component.  Hives can be caused by a variety of different triggers including illness/infection, foods, medications, stings, exercise, pressure, vibrations, extremes of temperature to name a few however majority of the time there is no identifiable trigger.   - if hives return take high-dose antihistamine regimen:  Allegra  in AM, Zyrtec  or Allegra  in PM, Pepcid  20mg  1 tab twice a day in AM and in PM, Singulair  daily.    Asthma -Lung function looks great today -Continue Breo 200mcg 1 puff once a day. Rinse mouth after use.  - continue  singulair  10mg  at bedtime daily - albuterol  as needed 2 puffs every 4-6 hours for cough, wheeze, chest tightness, difficulty breathing follow-up   Asthma control goals:  Full participation in all desired activities (may need albuterol  before activity) Albuterol  use two time or less a week on average (not counting use with activity) Cough interfering with sleep two time or less a month Oral steroids no more than once a year No hospitalizations  Allergic rhinoconjunctivitis - continue singulair  as above - use Allegra  180mg  or Zyrtec  10mg  daily as needed - use nasal Atrovent  1-2 sprays each nostril up to 3-4 times a day as needed for nasal drainage or congestion.     Follow-up in 6 months or sooner if needed  I appreciate the opportunity to take part in Olivia Paul's care. Please do not hesitate to contact me with questions.  Sincerely,   Danita Brain, MD Allergy /Immunology [facility]

## 2023-11-17 NOTE — Patient Instructions (Addendum)
 Urticaria, chronic - etiology of hives likely with a allergen component.  Hives can be caused by a variety of different triggers including illness/infection, foods, medications, stings, exercise, pressure, vibrations, extremes of temperature to name a few however majority of the time there is no identifiable trigger.   - if hives return take high-dose antihistamine regimen:  Allegra  in AM, Zyrtec  or Allegra  in PM, Pepcid  20mg  1 tab twice a day in AM and in PM, Singulair  daily.    Asthma -Lung function looks great today -Continue Breo 200mcg 1 puff once a day. Rinse mouth after use.  - continue singulair  10mg  at bedtime daily - albuterol  as needed 2 puffs every 4-6 hours for cough, wheeze, chest tightness, difficulty breathing follow-up   Asthma control goals:  Full participation in all desired activities (may need albuterol  before activity) Albuterol  use two time or less a week on average (not counting use with activity) Cough interfering with sleep two time or less a month Oral steroids no more than once a year No hospitalizations  Allergic rhinoconjunctivitis - continue singulair  as above - use Allegra  180mg  or Zyrtec  10mg  daily as needed - use nasal Atrovent  1-2 sprays each nostril up to 3-4 times a day as needed for nasal drainage or congestion.     Follow-up in 6 months or sooner if needed

## 2023-11-18 DIAGNOSIS — R152 Fecal urgency: Secondary | ICD-10-CM | POA: Insufficient documentation

## 2023-11-18 NOTE — Assessment & Plan Note (Signed)
 BMI 41.  She is encouraged to strive to lose ten percent of her body weight to decrease cardiac risk.

## 2023-11-18 NOTE — Assessment & Plan Note (Signed)
 Episodes occur without clear precipitating factors, with sudden onset and severe, distressing symptoms. - Advise to maintain hydration, especially during flights.

## 2023-11-18 NOTE — Assessment & Plan Note (Signed)
 Continues to experience bowel leakage. Consulted with Dr. Marilynne, who plans treatment involving sacral neuromodulation - Review Dr. Susen notes for treatment details.

## 2023-11-18 NOTE — Assessment & Plan Note (Signed)
 Chronic, she is currently on atorvastatin 20mg  daily. Encouraged to follow a heart healthy lifestyle.

## 2023-11-18 NOTE — Assessment & Plan Note (Signed)
 Previous labs reviewed, her A1c has been elevated in the past. I will check an A1c today. Reminded to avoid refined sugars including sugary drinks/foods and processed meats including bacon, sausages and deli meats.

## 2023-11-20 NOTE — Assessment & Plan Note (Signed)
 BP has now improved. Will continue to monitor at each visit. - Follow low sodium diet.

## 2023-11-21 LAB — ALKALINE PHOSPHATASE ISOENZYMES
Alkaline Phosphatase: 160 IU/L — ABNORMAL HIGH (ref 49–135)
BONE FRACTION %:: 47 %
Bone Fraction IU/L:: 75 IU/L — ABNORMAL HIGH (ref 18–57)
INTESTINAL FRAC.%:: 1 %
INTESTINALFRAC.IU/L:: 2 IU/L (ref 0–14)
LIVER FRACTION %:: 52 %
Liver Fraction IU/L:: 83 IU/L (ref 23–85)

## 2023-11-21 LAB — SPECIMEN STATUS REPORT

## 2023-12-05 ENCOUNTER — Ambulatory Visit: Payer: Self-pay

## 2023-12-13 ENCOUNTER — Other Ambulatory Visit: Payer: Self-pay | Admitting: Obstetrics and Gynecology

## 2023-12-13 DIAGNOSIS — N3281 Overactive bladder: Secondary | ICD-10-CM

## 2023-12-13 DIAGNOSIS — R159 Full incontinence of feces: Secondary | ICD-10-CM

## 2024-01-17 ENCOUNTER — Encounter: Payer: Self-pay | Admitting: Obstetrics and Gynecology

## 2024-01-25 ENCOUNTER — Encounter (HOSPITAL_BASED_OUTPATIENT_CLINIC_OR_DEPARTMENT_OTHER): Payer: Self-pay | Admitting: Cardiology

## 2024-02-13 ENCOUNTER — Ambulatory Visit: Admitting: Obstetrics and Gynecology

## 2024-02-13 ENCOUNTER — Encounter: Payer: Self-pay | Admitting: Obstetrics and Gynecology

## 2024-02-13 VITALS — BP 103/71 | HR 77

## 2024-02-13 DIAGNOSIS — R159 Full incontinence of feces: Secondary | ICD-10-CM

## 2024-02-13 DIAGNOSIS — N3281 Overactive bladder: Secondary | ICD-10-CM

## 2024-02-13 MED ORDER — SODIUM BICARBONATE 8.4 % IV SOLN
5.0000 mL | Freq: Once | INTRAVENOUS | Status: AC
  Administered 2024-02-13: 5 mL

## 2024-02-13 MED ORDER — LIDOCAINE-EPINEPHRINE 1 %-1:100000 IJ SOLN
30.0000 mL | Freq: Once | INTRAMUSCULAR | Status: AC
  Administered 2024-02-13: 30 mL via INTRADERMAL

## 2024-02-13 NOTE — Progress Notes (Signed)
 Interstim Basic Evaluation (PNE) Procedure   Procedure: Sacral Neuromodulator percutaneous sacral stimulation test using bony landmarks. Procedure repeated contralaterally for bilateral test lead placement.    Indication: 68 y.o.F with diagnosis of refractory overactive bladder and fecal incontinence . Informed consent has been obtained.    Description:  Patient was properly identified and placed in the prone position. Patient was positioned to allow the toes to dangle freely. A grounding pad was placed on the bottom of the patient's foot and the cable was connected  to the grounding pad and external stimulation box. The patient was then prepped and draped in the usual sterile fashion. 9cm was measured from the tip of the coccyx superiorly in the midline, then 2cm superiorly and 2cm laterally to mark the foramen bilaterally. Local injection of 30ml 1% lidocaine  with epinephrine  with 2ml of sodium bicarbonate  was injected at the planned entry site bilaterally.    The foramen needle was placed at a 60 degree angle into the right S3 foramen without difficulty. The proper S3 position was confirmed by the patient's sensation to stimulation utilizing the external test stimulator. This was repeated on the left side. The needle stylet was removed and the percutaneous lead was inserted. Using a push and pull technique, the needle was taken out over the lead. This was then repeated on the opposite side.    The patient cables were connected to the leads and grounding pads. The patient was cleaned off and the entire region was covered with tegaderms to secure the leads. The estimated blood loss was negligible. The patient tolerated the procedure well with no complications. Using the external test stimulator, the patient was programmed to the optimum sensation via the lead, and given instructions. Urinary and bowel diary will be kept until the return appointment. She was given post-procedure instructions and  precautions to call the office with any concerns.    Return 1 week for follow up and lead removal.     Rosaline LOISE Caper, MD

## 2024-02-13 NOTE — Patient Instructions (Signed)
 Do not get the dressings or battery wet Light activity only If dressings start to come off,  you can place tape over them but do not remove any. Call the office to notify us .

## 2024-02-14 ENCOUNTER — Other Ambulatory Visit: Payer: Self-pay

## 2024-02-14 ENCOUNTER — Encounter: Payer: Self-pay | Admitting: Obstetrics and Gynecology

## 2024-02-14 ENCOUNTER — Ambulatory Visit: Admitting: Obstetrics and Gynecology

## 2024-02-14 VITALS — BP 123/81 | HR 79

## 2024-02-14 DIAGNOSIS — N952 Postmenopausal atrophic vaginitis: Secondary | ICD-10-CM | POA: Diagnosis not present

## 2024-02-14 DIAGNOSIS — N3281 Overactive bladder: Secondary | ICD-10-CM

## 2024-02-14 DIAGNOSIS — R159 Full incontinence of feces: Secondary | ICD-10-CM

## 2024-02-14 DIAGNOSIS — Z48816 Encounter for surgical aftercare following surgery on the genitourinary system: Secondary | ICD-10-CM

## 2024-02-14 MED ORDER — ESTRADIOL 0.01 % VA CREA: 0.5000 g | TOPICAL_CREAM | VAGINAL | 11 refills | Status: AC

## 2024-02-14 NOTE — Progress Notes (Signed)
 [facility] Return Visit  SUBJECTIVE  History of Present Illness: Olivia Paul is a 68 y.o. female seen in follow-up for OAB and FI. She had [employer] PNE yesterday.  Last night, had extreme sciatic pain in the right side. She called the [employer] rep Olivia Paul) and he had her turn down the stimulation to 1.0 (from 1.2). She feels a lot better after this- feels normal. She has already noticed an improvement with holding her urine but she has had two bowel accidents. She has not been taking her fiber supplement recently.   She also went to her other Dr and was told the dressing appeared saturated so she wanted to have it checked.   She is requesting a refill of her vaginal estrogen cream for atrophy.    Past Medical History: Patient  has a past medical history of Allergy , Anxiety, Arthritis, Asthma (2011), Complication of anesthesia, Fatty liver, GERD (gastroesophageal reflux disease), Headache, History of COVID-19 (10/11/2022), PONV (postoperative nausea and vomiting), Pre-diabetes, Sleep apnea, Urticaria, Vitamin D  deficiency, and Wears glasses.   Past Surgical History: She  has a past surgical history that includes Breast biopsy; Breast excisional biopsy; Knee arthroscopy with meniscal repair (Left, 02/21/2018); Total knee arthroplasty (Left, 11/16/2018); total knee arthroplasty-revision (Left, 08/31/2020); Lipoma excision; Trigger finger release (Right, 12/25/2020); Vaginal hysterectomy (1986); Vaginal prolapse repair; Appendectomy (1986); Robotic assisted laparoscopic sacrocolpopexy (N/A, 11/08/2022); Cystoscopy (N/A, 11/08/2022); Xi robotic assisted oophorectomy (Right, 11/08/2022); and Tonsillectomy.   Medications: She has a current medication list which includes the following prescription(s): acetaminophen , albuterol , albuterol , vitamin c, atorvastatin , breo ellipta , cetirizine , vitamin d , clobetasol  cream, duloxetine , epinephrine , [START ON 02/15/2024] estradiol,  fexofenadine , gabapentin , hydroxyzine , ipratropium, ipratropium-albuterol , omega-3, lidocaine , magnesium  oxide, meclizine , menthol-methyl salicylate, montelukast , pantoprazole , prednisone , tizanidine , tramadol , triamcinolone  cream, turmeric, gemtesa , vitamin e, vitamin k, and zonisamide.   Allergies: Patient is allergic to meloxicam, nsaids, sulfa antibiotics, cat dander, other, oxycodone , pollen extract, and erythromycin.   Social History: Patient  reports that she has never smoked. She has been exposed to tobacco smoke. She has never used smokeless tobacco. She reports that she does not drink alcohol and does not use drugs.     OBJECTIVE     Physical Exam: Vitals:   02/14/24 0859  BP: 123/81  Pulse: 79   Gen: No apparent distress, A&O x 3.  Tegaderm in place. 4x4 dressing mostly red with old blood. No active bleeding noted so dressing left in place.    ASSESSMENT AND PLAN    [name] is a 68 y.o. with:  1. Vaginal atrophy   2. Overactive bladder   3. Incontinence of feces, unspecified fecal incontinence type     Vaginal atrophy -     Estradiol; Place 0.5 g vaginally 2 (two) times a week. Place 0.5g nightly for two weeks then twice a week after  Dispense: 42.5 g; Refill: 11  Overactive bladder  Incontinence of feces, unspecified fecal incontinence type  - Continue trial. We discussed turning down stimulation more or changing sides if pain persists.  - Restart fiber supplementation for stool bulking.   Return 1 week for lead removal   Olivia LOISE Caper, MD  Time spent: I spent 15 minutes dedicated to the care of this patient on the date of this encounter to include pre-visit review of records, face-to-face time with the patient and post visit documentation and ordering medication/ testing.

## 2024-02-19 ENCOUNTER — Encounter: Payer: Self-pay | Admitting: Obstetrics and Gynecology

## 2024-02-19 ENCOUNTER — Ambulatory Visit: Admitting: Obstetrics and Gynecology

## 2024-02-19 VITALS — BP 118/80 | HR 73

## 2024-02-19 DIAGNOSIS — R159 Full incontinence of feces: Secondary | ICD-10-CM

## 2024-02-19 DIAGNOSIS — N3281 Overactive bladder: Secondary | ICD-10-CM

## 2024-02-19 DIAGNOSIS — R350 Frequency of micturition: Secondary | ICD-10-CM

## 2024-02-19 NOTE — Progress Notes (Signed)
 [facility] Return Visit  SUBJECTIVE  History of Present Illness: Olivia Paul is a 69 y.o. female seen in follow-up for PNE. Patient endorses 80% overall improvement in bowel and bladder symptoms. She had x2 initial bowel accidents and then had no further bowel accidents with the device. She endorses no urinary leakage and states her overall urgency of urination was 90% better.   Bladder and Bowel diary to be scanned into the chart.   Past Medical History: Patient  has a past medical history of Allergy , Anxiety, Arthritis, Asthma (2011), Complication of anesthesia, Fatty liver, GERD (gastroesophageal reflux disease), Headache, History of COVID-19 (10/11/2022), PONV (postoperative nausea and vomiting), Pre-diabetes, Sleep apnea, Urticaria, Vitamin D  deficiency, and Wears glasses.   Past Surgical History: She  has a past surgical history that includes Breast biopsy; Breast excisional biopsy; Knee arthroscopy with meniscal repair (Left, 02/21/2018); Total knee arthroplasty (Left, 11/16/2018); total knee arthroplasty-revision (Left, 08/31/2020); Lipoma excision; Trigger finger release (Right, 12/25/2020); Vaginal hysterectomy (1986); Vaginal prolapse repair; Appendectomy (1986); Robotic assisted laparoscopic sacrocolpopexy (N/A, 11/08/2022); Cystoscopy (N/A, 11/08/2022); Xi robotic assisted oophorectomy (Right, 11/08/2022); and Tonsillectomy.   Medications: She has a current medication list which includes the following prescription(s): acetaminophen , albuterol , albuterol , vitamin c, atorvastatin , breo ellipta , cetirizine , vitamin d , clobetasol  cream, duloxetine , epinephrine , estradiol , fexofenadine , gabapentin , hydroxyzine , ipratropium, ipratropium-albuterol , omega-3, lidocaine , magnesium  oxide, meclizine , menthol-methyl salicylate, montelukast , pantoprazole , prednisone , tizanidine , tramadol , triamcinolone  cream, turmeric, gemtesa , vitamin e, vitamin k, and zonisamide.    Allergies: Patient is allergic to meloxicam, nsaids, sulfa antibiotics, cat dander, other, oxycodone , pollen extract, and erythromycin.   Social History: Patient  reports that she has never smoked. She has been exposed to tobacco smoke. She has never used smokeless tobacco. She reports that she does not drink alcohol and does not use drugs.     OBJECTIVE     Physical Exam: Vitals:   02/19/24 0958  BP: 118/80  Pulse: 73   Gen: No apparent distress, A&O x 3.  Detailed Urogynecologic Evaluation:  Deferred.   PNE lead wires were removed without incident. No sign of active bleeding from insertion site and no signs of infection present. Patient tolerated procedure well.      ASSESSMENT AND PLAN    Olivia Paul is a 69 y.o. with:  1. Incontinence of feces, unspecified fecal incontinence type   2. Overactive bladder   3. Urinary frequency    Paient had x2 bowel accidents during her trial period. Overall had a >50% improvement in her bowel symptoms.  Patient reports a greater than 50% improvement in her bladder urgency and frequency. She is happy with her trial phase and would like to proceed with permanent implantation.  Bladder diary to be scanned into chart. Vast improvement from reported baseline for bother frequency and urgency.   Patient to follow up for permanent insertion.   Damyen Knoll G Ladine Kiper, NP

## 2024-02-26 ENCOUNTER — Encounter (HOSPITAL_COMMUNITY): Payer: Self-pay | Admitting: Obstetrics and Gynecology

## 2024-02-26 NOTE — Progress Notes (Signed)
 Spoke w/ via phone for pre-op interview--- [facility] needs dos----   NONE      Lab results------ COVID test -----patient states asymptomatic no test needed Arrive at -------1200 NPO after MN NO Solid Food.  Clear liquids from MN until---1100 Pre-Surgery Ensure or G2:  Med rec completed Medications to take morning of surgery -----Breo Ellipta . Bring Albuterol  inhaler. Diabetic medication -----  GLP1 agonist last dose: GLP1 instructions:  Patient instructed no nail polish to be worn day of surgery Patient instructed to bring photo id and insurance card day of surgery Patient aware to have Driver (ride ) / caregiver    for 24 hours after surgery - Neighbor Will Ned Patient Special Instructions ----- Pre-Op special Instructions -----  Patient verbalized understanding of instructions that were given at this phone interview. Patient denies chest pain, sob, fever, cough at the interview.

## 2024-02-28 MED ORDER — GENTAMICIN SULFATE 40 MG/ML IJ SOLN
5.0000 mg/kg | INTRAVENOUS | Status: DC
  Filled 2024-02-28: qty 7.75

## 2024-02-29 ENCOUNTER — Other Ambulatory Visit (HOSPITAL_COMMUNITY): Payer: Self-pay

## 2024-02-29 ENCOUNTER — Ambulatory Visit (HOSPITAL_COMMUNITY)

## 2024-02-29 ENCOUNTER — Ambulatory Visit (HOSPITAL_COMMUNITY): Admitting: Anesthesiology

## 2024-02-29 ENCOUNTER — Encounter (HOSPITAL_COMMUNITY): Payer: Self-pay | Admitting: Obstetrics and Gynecology

## 2024-02-29 ENCOUNTER — Ambulatory Visit (HOSPITAL_COMMUNITY)
Admission: RE | Admit: 2024-02-29 | Discharge: 2024-02-29 | Disposition: A | Discharge status: Home / Self Care | Attending: Obstetrics and Gynecology | Admitting: Obstetrics and Gynecology

## 2024-02-29 DIAGNOSIS — R159 Full incontinence of feces: Secondary | ICD-10-CM | POA: Insufficient documentation

## 2024-02-29 DIAGNOSIS — F419 Anxiety disorder, unspecified: Secondary | ICD-10-CM | POA: Insufficient documentation

## 2024-02-29 DIAGNOSIS — M1990 Unspecified osteoarthritis, unspecified site: Secondary | ICD-10-CM | POA: Diagnosis not present

## 2024-02-29 DIAGNOSIS — N3281 Overactive bladder: Secondary | ICD-10-CM | POA: Diagnosis present

## 2024-02-29 DIAGNOSIS — E7800 Pure hypercholesterolemia, unspecified: Secondary | ICD-10-CM | POA: Diagnosis not present

## 2024-02-29 DIAGNOSIS — J45909 Unspecified asthma, uncomplicated: Secondary | ICD-10-CM | POA: Insufficient documentation

## 2024-02-29 DIAGNOSIS — Z6841 Body Mass Index (BMI) 40.0 and over, adult: Secondary | ICD-10-CM | POA: Diagnosis not present

## 2024-02-29 DIAGNOSIS — R7303 Prediabetes: Secondary | ICD-10-CM | POA: Insufficient documentation

## 2024-02-29 DIAGNOSIS — E66813 Obesity, class 3: Secondary | ICD-10-CM | POA: Diagnosis not present

## 2024-02-29 DIAGNOSIS — G4730 Sleep apnea, unspecified: Secondary | ICD-10-CM | POA: Insufficient documentation

## 2024-02-29 DIAGNOSIS — G4733 Obstructive sleep apnea (adult) (pediatric): Secondary | ICD-10-CM | POA: Diagnosis not present

## 2024-02-29 DIAGNOSIS — K219 Gastro-esophageal reflux disease without esophagitis: Secondary | ICD-10-CM | POA: Diagnosis not present

## 2024-02-29 DIAGNOSIS — Z01818 Encounter for other preprocedural examination: Secondary | ICD-10-CM

## 2024-02-29 MED ORDER — ORAL CARE MOUTH RINSE: 15.0000 mL | Freq: Once | OROMUCOSAL | Status: AC

## 2024-02-29 MED ORDER — TRAMADOL HCL 50 MG PO TABS
50.0000 mg | ORAL_TABLET | Freq: Four times a day (QID) | ORAL | 0 refills | Status: AC | PRN
  Filled 2024-02-29: qty 5, 2d supply, fill #0

## 2024-02-29 MED ORDER — CEFAZOLIN SODIUM-DEXTROSE 2-4 GM/100ML-% IV SOLN
INTRAVENOUS | Status: AC
  Filled 2024-02-29: qty 100

## 2024-02-29 MED ORDER — PROPOFOL 10 MG/ML IV BOLUS
INTRAVENOUS | Status: DC | PRN
  Administered 2024-02-29: 50 mg via INTRAVENOUS

## 2024-02-29 MED ORDER — ONDANSETRON HCL 4 MG/2ML IJ SOLN
INTRAMUSCULAR | Status: DC | PRN
  Administered 2024-02-29: 4 mg via INTRAVENOUS

## 2024-02-29 MED ORDER — BUPIVACAINE HCL 0.25 % IJ SOLN
INTRAMUSCULAR | Status: DC | PRN
  Administered 2024-02-29: 30 mL

## 2024-02-29 MED ORDER — POVIDONE-IODINE 10 % EX SWAB: 2.0000 | Freq: Once | CUTANEOUS | Status: DC

## 2024-02-29 MED ORDER — WATER FOR IRRIGATION, STERILE IR SOLN
Status: DC | PRN
  Administered 2024-02-29: 1000 mL

## 2024-02-29 MED ORDER — LIDOCAINE 2% (20 MG/ML) 5 ML SYRINGE
INTRAMUSCULAR | Status: AC
  Filled 2024-02-29: qty 5

## 2024-02-29 MED ORDER — PROPOFOL 500 MG/50ML IV EMUL
INTRAVENOUS | Status: DC | PRN
  Administered 2024-02-29: 100 ug/kg/min via INTRAVENOUS

## 2024-02-29 MED ORDER — FENTANYL CITRATE (PF) 100 MCG/2ML IJ SOLN
25.0000 ug | INTRAMUSCULAR | Status: DC | PRN
  Administered 2024-02-29 (×2): 25 ug via INTRAVENOUS

## 2024-02-29 MED ORDER — FENTANYL CITRATE (PF) 100 MCG/2ML IJ SOLN
INTRAMUSCULAR | Status: DC | PRN
  Administered 2024-02-29: 25 ug via INTRAVENOUS
  Administered 2024-02-29: 50 ug via INTRAVENOUS
  Administered 2024-02-29: 25 ug via INTRAVENOUS

## 2024-02-29 MED ORDER — ACETAMINOPHEN 500 MG PO TABS
500.0000 mg | ORAL_TABLET | Freq: Four times a day (QID) | ORAL | 0 refills | Status: AC | PRN
  Filled 2024-02-29: qty 30, 8d supply, fill #0

## 2024-02-29 MED ORDER — CHLORHEXIDINE GLUCONATE 0.12 % MT SOLN
OROMUCOSAL | Status: AC
  Administered 2024-02-29: 15 mL via OROMUCOSAL
  Filled 2024-02-29: qty 15

## 2024-02-29 MED ORDER — FENTANYL CITRATE (PF) 100 MCG/2ML IJ SOLN
INTRAMUSCULAR | Status: AC
  Filled 2024-02-29: qty 2

## 2024-02-29 MED ORDER — ONDANSETRON HCL 4 MG/2ML IJ SOLN: 4.0000 mg | Freq: Once | INTRAMUSCULAR | Status: DC | PRN

## 2024-02-29 MED ORDER — MIDAZOLAM HCL 2 MG/2ML IJ SOLN
INTRAMUSCULAR | Status: AC
  Filled 2024-02-29: qty 2

## 2024-02-29 MED ORDER — CHLORHEXIDINE GLUCONATE 0.12 % MT SOLN: 15.0000 mL | Freq: Once | OROMUCOSAL | Status: AC

## 2024-02-29 MED ORDER — ACETAMINOPHEN 500 MG PO TABS
ORAL_TABLET | ORAL | Status: AC
  Administered 2024-02-29: 1000 mg via ORAL
  Filled 2024-02-29: qty 2

## 2024-02-29 MED ORDER — ACETAMINOPHEN 500 MG PO TABS: 1000.0000 mg | ORAL_TABLET | Freq: Once | ORAL | Status: AC

## 2024-02-29 MED ORDER — CEFAZOLIN SODIUM-DEXTROSE 2-4 GM/100ML-% IV SOLN
2.0000 g | INTRAVENOUS | Status: AC
  Administered 2024-02-29: 2 g via INTRAVENOUS

## 2024-02-29 MED ORDER — MIDAZOLAM HCL (PF) 2 MG/2ML IJ SOLN
INTRAMUSCULAR | Status: DC | PRN
  Administered 2024-02-29: 2 mg via INTRAVENOUS

## 2024-02-29 MED ORDER — LACTATED RINGERS IV SOLN: INTRAVENOUS | Status: DC

## 2024-02-29 MED ORDER — ONDANSETRON HCL 4 MG/2ML IJ SOLN
INTRAMUSCULAR | Status: AC
  Filled 2024-02-29: qty 2

## 2024-02-29 MED ORDER — LIDOCAINE 2% (20 MG/ML) 5 ML SYRINGE
INTRAMUSCULAR | Status: DC | PRN
  Administered 2024-02-29: 60 mg via INTRAVENOUS

## 2024-02-29 NOTE — Discharge Instructions (Addendum)
POST OPERATIVE SACRAL NEUROMODULATION INSTRUCTIONS ° °General Instructions °Recovery (not bed rest) will last approximately 1 weeks °Walking is encouraged, but refrain from strenuous exercise/ housework/ heavy lifting. °Bathing:  Do not submerge in water (NO swimming, bath, hot tub, etc) until after your postop visit.  °Stage 1 °You can sponge bath around the incision or shower only on the front without wetting the back.  °Do not remove bandages. Ensure they are well taped and secured on the back. If bandages start to come off, please call the office °Stage 2 °Wait 48 hours to remove bandages °You may shower after bandages are removed. You can wash with soap and water but do not scrub the incision.  °No driving until you are not taking narcotic pain medicine and until your pain is well enough controlled that you can slam on the breaks or make sudden movements if needed.  ° °Taking your medications °Please take your acetaminophen and ibuprofen on a schedule for the first 48 hours. Take 600mg ibuprofen, then take 500mg acetaminophen 3 hours later, then continue to alternate ibuprofen and acetaminophen. That way you are taking each type of medication every 6 hours. °Take the prescribed narcotic (oxycodone, tramadol, etc) as needed, with a maximum being every 4 hours.  °Antibiotics will be prescribed during the test period (after stage 1).  ° °Reasons to Call the Nurse (see last page for phone numbers) °Persistent nausea/vomiting °Fever (100.4 degrees or more) °Incision problems (pus, blood or other fluid coming out, redness, warmth, increased pain) ° °Things to Expect After Surgery °Mild to Moderate pain is normal during the first day or two after surgery. If prescribed, take Ibuprofen or Tylenol first and use the stronger medicine for “break-through” pain. You can overlap these medicines because they work differently.  ° °Constipation  ° °To Prevent Constipation:  Eat a well-balanced diet including protein, grains,  fresh fruit and vegetables.  Drink plenty of fluids. Walk regularly.  Depending on specific instructions from your physician: take Miralax daily and additionally you can add a stool softener (colace/ docusate) and fiber supplement. Continue as long as you're on pain medications.  ° °To Treat Constipation:  If you do not have a bowel movement in 2 days after surgery, you can take 2 Tbs of Milk of Magnesia 1-2 times a day until you have a bowel movement. If diarrhea occurs, decrease the amount or stop the laxative. If no results with Milk of Magnesia, you can drink a bottle of magnesium citrate which you can purchase over the counter. ° °Fatigue:  This is a normal response to surgery and will improve with time.  Plan frequent rest periods throughout the day. ° ° °Return to Work ° °As work demands and recovery times vary widely, it is hard to predict when you will want to return to work. If you have a desk job with no strenuous physical activity, and if you would like to return sooner than generally recommended, discuss this with your provider or call our office.  ° °Post op concerns ° °For non-emergent issues, please call the Urogynecology Nurse. Please leave a message and someone will contact you within one business day.  You can also send a message through MyChart.  ° °AFTER HOURS (After 5:00 PM and on weekends):  For urgent matters that cannot wait until the next business day. Call our office [phone] and connect to the doctor on call.  °Please reserve this for important issues. ° ° °**FOR ANY TRUE EMERGENCY ISSUES CALL 911   OR GO TO THE NEAREST EMERGENCY ROOM.** Please inform our office or the doctor on call of any emergency.   ° ° °APPOINTMENTS: Call [phone] ° °Post Anesthesia Home Care Instructions ° °Activity: °Get plenty of rest for the remainder of the day. A responsible adult should stay with you for 24 hours following the procedure.  °For the next 24 hours, DO NOT: °-Drive a car °-Operate  machinery °-Drink alcoholic beverages °-Take any medication unless instructed by your physician °-Make any legal decisions or sign important papers. ° °Meals: °Start with liquid foods such as gelatin or soup. Progress to regular foods as tolerated. Avoid greasy, spicy, heavy foods. If nausea and/or vomiting occur, drink only clear liquids until the nausea and/or vomiting subsides. Call your physician if vomiting continues. ° °Special Instructions/Symptoms: °Your throat may feel dry or sore from the anesthesia or the breathing tube placed in your throat during surgery. If this causes discomfort, gargle with warm salt water. The discomfort should disappear within 24 hours. ° ° ° °   °

## 2024-02-29 NOTE — H&P (Addendum)
 " [facility] Pre-Operative H&P  Subjective Chief Complaint: Olivia Paul presents for a preoperative encounter.   History of Present Illness: Olivia Paul is a 69 y.o. female who presents for preoperative visit.  She is scheduled to undergo Insertion of sacral nerve stimulator and implantable pulse generator with fluoroscopic guidance  on 02/29/24.  Her symptoms include overactive bladder. She had a PNE in office and had a >50% reduction in her symptoms.    Past Medical History:  Diagnosis Date   Allergy     Anxiety    Arthritis    lt knee   Asthma 2011   Complication of anesthesia    Fatty liver    GERD (gastroesophageal reflux disease)    Headache    sinus   History of COVID-19 10/11/2022   PONV (postoperative nausea and vomiting)    Pre-diabetes    Sleep apnea    cpap set on 4   Urticaria    Vitamin D  deficiency    Wears glasses      Past Surgical History:  Procedure Laterality Date   APPENDECTOMY  1986   BREAST BIOPSY     BREAST EXCISIONAL BIOPSY     CYSTOSCOPY N/A 11/08/2022   Procedure: CYSTOSCOPY;  Surgeon: Marilynne Rosaline SAILOR, MD;  Location: [facility];  Service: Gynecology;  Laterality: N/A;   KNEE ARTHROSCOPY WITH MENISCAL REPAIR Left 02/21/2018   LIPOMA EXCISION     ROBOTIC ASSISTED LAPAROSCOPIC SACROCOLPOPEXY N/A 11/08/2022   Procedure: XI ROBOTIC ASSISTED LAPAROSCOPIC SACROCOLPOPEXY;  Surgeon: Marilynne Rosaline SAILOR, MD;  Location: [facility];  Service: Gynecology;  Laterality: N/A;   TONSILLECTOMY     TOTAL KNEE ARTHROPLASTY Left 11/16/2018   Procedure: TOTAL KNEE ARTHROPLASTY;  Surgeon: Yvone Rush, MD;  Location: [facility];  Service: Orthopedics;  Laterality: Left;   total knee arthroplasty-revision Left 08/31/2020   Dr, Londa   TRIGGER FINGER RELEASE Right 12/25/2020   VAGINAL HYSTERECTOMY  1986   VAGINAL PROLAPSE REPAIR     1986   XI ROBOTIC ASSISTED OOPHORECTOMY Right 11/08/2022    Procedure: XI ROBOTIC ASSISTED OOPHORECTOMY;  Surgeon: Marilynne Rosaline SAILOR, MD;  Location: [facility];  Service: Gynecology;  Laterality: Right;    is allergic to meloxicam, nsaids, sulfa antibiotics, cat dander, other, oxycodone , pollen extract, and erythromycin.   Family History  Problem Relation Age of Onset   Kidney disease Mother    Hypertension Mother    Allergic rhinitis Mother    Heart disease Father    Allergic rhinitis Father    Asthma Father    Urticaria Sister    Breast cancer Paternal Grandmother    Cancer Paternal Grandmother    COPD Paternal Grandmother    Breast cancer Maternal Aunt    Cancer Maternal Aunt    COPD Maternal Aunt    Hypertension Maternal Aunt    Breast cancer Paternal Aunt    Anxiety disorder Daughter    Depression Daughter    Asthma Son    Asthma Sister    Cancer Sister    Hypertension Sister    Cancer Maternal Aunt    Cancer Maternal Aunt    Cancer Maternal Uncle    Hypertension Maternal Uncle    Cancer Maternal Uncle    Cancer Maternal Uncle    Hypertension Maternal Uncle    Cancer Maternal Uncle    COPD Maternal Uncle    Depression Maternal Uncle    COPD Paternal Uncle    Hypertension  Sister    Hypertension Brother    Angioedema Neg Hx    Atopy Neg Hx    Eczema Neg Hx    Immunodeficiency Neg Hx     Social History[1]   Review of Systems was negative for a full 10 system review except as noted in the History of Present Illness.  Current Medications[2]   Objective There were no vitals filed for this visit.  Gen: NAD CV: S1 S2 RRR Lungs: Clear to auscultation bilaterally Abd: soft, nontender     Assessment/ Plan  Assessment: The patient is a 69 y.o. year old with overactive bladder scheduled to undergo Insertion of sacral nerve stimulator and implantable pulse generator with fluoroscopic guidance   Plan: General Surgical Consent: The patient has previously been counseled on alternative treatments,  and the decision by the patient and provider was to proceed with the procedure listed above.  For all procedures, there are risks of bleeding, infection, damage to surrounding organs including but not limited to bowel, bladder, blood vessels, ureters and nerves, and need for further surgery if an injury were to occur. These risks are all low with minimally invasive surgery.   There are risks of numbness and weakness at any body site or buttock/rectal pain.  It is possible that baseline pain can be worsened by surgery, either with or without mesh. If surgery is vaginal, there is also a low risk of possible conversion to laparoscopy or open abdominal incision where indicated. Very rare risks include blood transfusion, blood clot, heart attack, pneumonia, or death.   Rosaline LOISE Caper, MD      [1]  Social History Tobacco Use   Smoking status: Never    Passive exposure: Past   Smokeless tobacco: Never  Vaping Use   Vaping status: Never Used  Substance Use Topics   Alcohol use: No   Drug use: No  [2]  Current Facility-Administered Medications:    gentamicin  (GARAMYCIN ) 310 mg in dextrose  5 % 50 mL IVPB, 5 mg/kg (Order-Specific), Intravenous, On Call to OR, Caper Rosaline LOISE, MD  Current Outpatient Medications:    albuterol  (PROVENTIL ) (2.5 MG/3ML) 0.083% nebulizer solution, Use 1 vial every 4-6 hours as needed for cough, wheeze, shortness of breath or chest tightness, Disp: 150 mL, Rfl: 0   Ascorbic Acid (VITAMIN C) 100 MG tablet, Take 100 mg by mouth daily., Disp: , Rfl:    atorvastatin  (LIPITOR) 20 MG tablet, TAKE 1 TABLET BY MOUTH EVERY DAY, Disp: 90 tablet, Rfl: 1   BREO ELLIPTA  200-25 MCG/ACT AEPB, Inhale 1 puff into the lungs daily., Disp: 60 each, Rfl: 5   cetirizine  (ZYRTEC ) 10 MG tablet, Take 1 tablet (10 mg total) by mouth daily., Disp: 30 tablet, Rfl: 5   Cholecalciferol (VITAMIN D ) 50 MCG (2000 UT) CAPS, Take 5,000 Units by mouth daily. , Disp: , Rfl:    DULoxetine   (CYMBALTA ) 60 MG capsule, TAKE 1 CAPSULE BY MOUTH EVERY DAY, Disp: 90 capsule, Rfl: 2   fexofenadine  (ALLEGRA ) 180 MG tablet, Take 1 tablet by mouth daily as needed for for allergy  symptoms. Take an extra tablet as needed for hive flare., Disp: 90 tablet, Rfl: 1   gabapentin  (NEURONTIN ) 300 MG capsule, as needed., Disp: , Rfl:    Krill Oil (OMEGA-3) 500 MG CAPS, Take 500 mg by mouth daily., Disp: , Rfl:    magnesium  oxide (MAG-OX) 400 MG tablet, Take 400 mg by mouth daily., Disp: , Rfl:    montelukast  (SINGULAIR ) 10 MG tablet, Take 1  tablet (10 mg total) by mouth at bedtime., Disp: 30 tablet, Rfl: 5   pantoprazole  (PROTONIX ) 40 MG tablet, TAKE 1 TABLET BY MOUTH EVERY DAY, Disp: 90 tablet, Rfl: 1   traMADol  (ULTRAM ) 50 MG tablet, Take 1 tablet (50 mg total) by mouth every 8 (eight) hours as needed for moderate pain (pain score 4-6)., Disp: 20 tablet, Rfl: 0   TURMERIC PO, Take by mouth as needed., Disp: , Rfl:    Vibegron  (GEMTESA ) 75 MG TABS, Take 1 tablet (75 mg total) by mouth daily., Disp: 30 tablet, Rfl: 11   vitamin E 400 UNIT capsule, Take 400 Units by mouth daily., Disp: , Rfl:    zonisamide (ZONEGRAN) 50 MG capsule, Take 100 mg by mouth at bedtime., Disp: , Rfl:    acetaminophen  (TYLENOL ) 500 MG tablet, Take 1 tablet (500 mg total) by mouth every 6 (six) hours as needed (pain)., Disp: 30 tablet, Rfl: 0   albuterol  (VENTOLIN  HFA) 108 (90 Base) MCG/ACT inhaler, Inhale 2 puffs into the lungs every 4 (four) hours as needed for wheezing or shortness of breath., Disp: 18 g, Rfl: 1   clobetasol  cream (TEMOVATE ) 0.05 %, Apply topically 2 (TWO) times a day as needed to areas of ras, Disp: 60 g, Rfl: 1   EPINEPHrine  0.3 mg/0.3 mL IJ SOAJ injection, Inject 0.3 mLs (0.3 mg total) into the muscle as needed for anaphylaxis., Disp: 1 each, Rfl: 1   estradiol  (ESTRACE ) 0.01 % CREA vaginal cream, Place 0.5 g vaginally 2 (two) times a week. Place 0.5g nightly for two weeks then twice a week after, Disp: 42.5  g, Rfl: 11   hydrOXYzine  (ATARAX ) 25 MG tablet, TAKE 1 TABLET BY MOUTH AT BEDTIME AS NEEDED FOR ITCHING., Disp: 30 tablet, Rfl: 0   ipratropium (ATROVENT ) 0.06 % nasal spray, 1-2 sprays each nostril up to 3-4 times a day as needed for nasal drainage or congestion, Disp: 15 mL, Rfl: 5   ipratropium-albuterol  (DUONEB) 0.5-2.5 (3) MG/3ML SOLN, Inhale 3 mLs into the lungs daily as needed., Disp: , Rfl:    lidocaine  (LIDODERM ) 5 %, Place 1 patch onto the skin daily., Disp: , Rfl:    meclizine  (ANTIVERT ) 25 MG tablet, Take 1 tablet (25 mg total) by mouth 3 (three) times daily as needed for dizziness., Disp: 30 tablet, Rfl: 0   Menthol-Methyl Salicylate (TIGER BALM LINIMENT EX), Apply 1 application topically at bedtime as needed (pain)., Disp: , Rfl:    predniSONE  (DELTASONE ) 5 MG tablet, Take 5 mg by mouth daily with breakfast. (Patient not taking: Reported on 02/26/2024), Disp: , Rfl:    tiZANidine  (ZANAFLEX ) 4 MG tablet, Take 4 mg by mouth every 8 (eight) hours as needed., Disp: , Rfl:    triamcinolone  cream (KENALOG ) 0.1 %, APPLY TO AFFECTED AREA TWICE DAILY AS NEEDED, Disp: 453.6 g, Rfl: 1   vitamin k 100 MCG tablet, Take 100 mcg by mouth daily., Disp: , Rfl:   "

## 2024-02-29 NOTE — Transfer of Care (Signed)
 Immediate Anesthesia Transfer of Care Note  Patient: Olivia Paul  Procedure(s) Performed: INSERTION, NEUROSTIMULATOR, SACRAL (Insertion of sacral nerve stimulator and implantable pulse generator with fluoroscopic guidance) (Buttocks)  Patient Location: PACU  Anesthesia Type:MAC  Level of Consciousness: awake  Airway & Oxygen  Therapy: Patient Spontanous Breathing  Post-op Assessment: Report given to RN and Post -op Vital signs reviewed and stable  Post vital signs: Reviewed and stable  Last Vitals:  Vitals Value Taken Time  BP 115/55 02/29/24 15:01  Temp    Pulse 72 02/29/24 15:06  Resp 23 02/29/24 15:06  SpO2 94 % 02/29/24 15:06  Vitals shown include unfiled device data.  Last Pain:  Vitals:   02/29/24 1148  TempSrc: Oral  PainSc: 3       Patients Stated Pain Goal: 3 (02/29/24 1148)  Complications: No notable events documented.

## 2024-02-29 NOTE — Op Note (Signed)
 Operative Note  Preoperative Diagnosis: overactive bladder  Postoperative Diagnosis: same  Procedures performed:  Placement of Interstim sacral nerve stimulator (left) and implantable pulse generator (left) with fluoroscopic guidance  Implants:  Implant Name Type Inv. Item Serial No. Manufacturer Lot No. LRB No. Used Action  LEAD INTERSTIM 4.32 28 L - [id number] Lead LEAD INTERSTIM 4.32 28 L  [employer] NEUROMOD PAIN MGMT [id number] Left 1 Implanted  STIMULATOR INTERSTIM 2X1.7X.3 - [id number] H Miscellaneous STIMULATOR INTERSTIM 2X1.7X.3 [id number] H [employer] NEUROMOD PAIN MGMT  Left 1 Implanted    Attending Surgeon: Rosaline Caper, MD  Anesthesia: MAC  Findings: Toe flexion and bellows with stimulation of all leads in S3 on the left  Specimens: none  Estimated blood loss: 5 mL  IV fluids: 600 mL  Urine output: n/a  Complications: none  Procedure in Detail:  After informed consent was obtained, the patient was taken to the operating room where anesthesia was induced and found to be adequate. Patient was placed in the prone position with adequate padding to flatten the sacrum and under the shins to allow the toes to dangle freely. She was then prepped and draped in the usual sterile fashion. The C-arm was positioned in the AP and lateral positions to provide fluoroscopic mapping of the sacral region. Spinal needles in laid in parallel were used to identify the foramen landmarks.The S3 foramen was identified. Local injection of 0.25% bupivacaine  was injected down each side to the level of the sacrum. The foramen needle was placed at a 60 degree angle into the S3 foramen on the left. Proper needle depth was confirmed with fluoroscopy. This was repeated on the right side.  Position was confirmed by direct observation of bellowing and plantar flexion of the toe utilizing the external test stimulator. Optimal response was noted on the left side, so the right side needle was removed.   The  foramen needle stylet was removed and guidewire was placed. An incision was made with an 11-blade scalpel and the introducer was placed over the guidewire. Proper depth was confirmed with fluoroscopy until the opaque mark of the dilator was seen on the anterior rim of the sacrum with fluoroscopy.  The obturator was unlocked and removed and the lead was placed through the introducer sheath to the first white line.  Position was checked fluoroscopically.  The lead was then introduced until three electrodes were visible below the sacrum.  Each electrode was tested for visualization of the bellows and plantar flexion of the great toe.  After satisfactory positioning was confirmed under fluoroscopy, the introducer sheath was retracted deploying the lead ties into the perisacral tissues under live fluoroscopy.    Attention was turned to the patient's left where anesthetic was injected at a point previously marked on the buttocks, below the iliac crest and below the belt line. The skin was injected with 0.25% bupivacaine  and an incision was made with a 15 blade scalpel. Further dissection was made into the subcutaneous tissue, allowing for a sufficient pocket for the generator.  A tunneling tool and tube were placed from the sacral lead subcutaneously to the incised pocket site.  The tunneling tool was removed and the lead was sent through the tube and pulled out at the pocket site. The pocket was noted to be hemostatic. The generator was connected and placed in the pocket. It was noted to have adequate room but also stayed in place. The incision was inspected and noted to be hemostatic.  The subcutaneous layer was closed with running 2-0  Vicryl. The skin was closed with a subcuticular stitch of 4-0 monocryl. The incision from the neuroelectrode was closed with interrupted 4-0 monocryl.  Dermabond was placed over the incisions. The patient tolerated the procedure well and was taken to the recovery room in stable  condition. Needle and sponge count was correct x2.    Rosaline LOISE Caper, MD

## 2024-02-29 NOTE — Anesthesia Preprocedure Evaluation (Addendum)
"                                    Anesthesia Evaluation  Patient identified by MRN, date of birth, ID band Patient awake    Reviewed: Allergy  & Precautions, NPO status , Patient's Chart, lab work & pertinent test results  History of Anesthesia Complications (+) PONV and history of anesthetic complications  Airway Mallampati: II  TM Distance: >3 FB Neck ROM: Full    Dental  (+) Dental Advisory Given, Teeth Intact   Pulmonary asthma , sleep apnea and Continuous Positive Airway Pressure Ventilation    Pulmonary exam normal        Cardiovascular negative cardio ROS Normal cardiovascular exam     Neuro/Psych  Headaches PSYCHIATRIC DISORDERS Anxiety        GI/Hepatic Neg liver ROS,GERD  Medicated and Controlled,, Fecal incontinence    Endo/Other    Class 3 obesity Pre-DM   Renal/GU negative Renal ROS Bladder dysfunction      Musculoskeletal  (+) Arthritis ,    Abdominal  (+) + obese  Peds  Hematology negative hematology ROS (+)   Anesthesia Other Findings   Reproductive/Obstetrics                              Anesthesia Physical Anesthesia Plan  ASA: 3  Anesthesia Plan: MAC   Post-op Pain Management: Tylenol  PO (pre-op)* and Minimal or no pain anticipated   Induction:   PONV Risk Score and Plan: 3 and Propofol  infusion, Treatment may vary due to age or medical condition and Ondansetron   Airway Management Planned: Natural Airway and Simple Face Mask  Additional Equipment: None  Intra-op Plan:   Post-operative Plan:   Informed Consent: I have reviewed the patients History and Physical, chart, labs and discussed the procedure including the risks, benefits and alternatives for the proposed anesthesia with the patient or authorized representative who has indicated his/her understanding and acceptance.       Plan Discussed with: CRNA and Anesthesiologist  Anesthesia Plan Comments:           Anesthesia Quick Evaluation  "

## 2024-03-01 ENCOUNTER — Encounter (HOSPITAL_COMMUNITY): Admission: RE | Disposition: A | Payer: Self-pay | Source: Home / Self Care | Attending: Obstetrics and Gynecology

## 2024-03-01 NOTE — Anesthesia Postprocedure Evaluation (Signed)
"   Anesthesia Post Note  Patient: Olivia Paul  Procedure(s) Performed: INSERTION, NEUROSTIMULATOR, SACRAL (Insertion of sacral nerve stimulator and implantable pulse generator with fluoroscopic guidance) (Buttocks)     Patient location during evaluation: PACU Anesthesia Type: MAC Level of consciousness: awake and alert Pain management: pain level controlled Vital Signs Assessment: post-procedure vital signs reviewed and stable Respiratory status: spontaneous breathing, nonlabored ventilation and respiratory function stable Cardiovascular status: blood pressure returned to baseline and stable Postop Assessment: no apparent nausea or vomiting Anesthetic complications: no   No notable events documented.  Last Vitals:  Vitals:   02/29/24 1537 02/29/24 1545  BP:  115/77  Pulse: 74 67  Resp: 19 20  Temp: 36.4 C   SpO2: 100% 95%    Last Pain:  Vitals:   02/29/24 1545  TempSrc:   PainSc: 3                  Debby FORBES Like      "

## 2024-03-05 ENCOUNTER — Other Ambulatory Visit: Payer: Self-pay | Admitting: Internal Medicine

## 2024-03-20 ENCOUNTER — Encounter: Payer: Self-pay | Admitting: Internal Medicine

## 2024-03-20 ENCOUNTER — Ambulatory Visit: Payer: Self-pay | Admitting: Internal Medicine

## 2024-03-20 VITALS — BP 118/78 | HR 87 | Temp 98.1°F | Ht 59.0 in | Wt 202.8 lb

## 2024-03-20 DIAGNOSIS — R10A1 Flank pain, right side: Secondary | ICD-10-CM

## 2024-03-20 DIAGNOSIS — Z809 Family history of malignant neoplasm, unspecified: Secondary | ICD-10-CM

## 2024-03-20 DIAGNOSIS — R7303 Prediabetes: Secondary | ICD-10-CM

## 2024-03-20 DIAGNOSIS — E7800 Pure hypercholesterolemia, unspecified: Secondary | ICD-10-CM

## 2024-03-20 DIAGNOSIS — Z6841 Body Mass Index (BMI) 40.0 and over, adult: Secondary | ICD-10-CM

## 2024-03-20 NOTE — Progress Notes (Unsigned)
 I,Victoria T Emmitt, CMA,acting as a neurosurgeon for Catheryn LOISE Slocumb, MD.,have documented all relevant documentation on the behalf of Catheryn LOISE Slocumb, MD,as directed by  Catheryn LOISE Slocumb, MD while in the presence of Catheryn LOISE Slocumb, MD.  Subjective:  Patient ID: Olivia Paul , female    DOB: [birth year] , 69 y.o.   MRN: [id number]  Chief Complaint  Patient presents with   Prediabetes    Patient presents today for prediabetes & cholesterol check, patient reports compliance with medications. She denies having any headaches, chest pain and shortness of breath. She states noticing she has gained weight. She would like options to help lose weight.    Hyperlipidemia    HPI Discussed the use of AI scribe software for clinical note transcription with the patient, who gave verbal consent to proceed.  History of Present Illness Olivia Paul is a 69 year old female who presents for follow-up and is interested in weight loss options.  She experiences side pain located near the ovary, which she attributes to a recent surgery on February 29, 2024, for a stimulator implantation. She still has stitches from the procedure.  She has difficulty sleeping, often not falling asleep until early morning, which results in fatigue. She attributes this to taking naps during the day.  She is interested in weight loss medications, specifically 'fat pills', and discusses the cost and insurance coverage. She identifies fast food as a significant challenge for her weight management, stating 'fast food is my killer'.  She has a strong family history of cancer, including a sister with lymphoma and leukemia, a mother with cervical cancer, a paternal grandmother and two aunts with breast cancer, multiple uncles with bladder and prostate cancer, and an aunt with pancreatic cancer and another with cervical cancer.  She acknowledges consuming candy corn recently, which she knows affects her blood sugar levels, indicating  awareness of her dietary impacts on her prediabetes.   Patient presents today for prediabetes & cholesterol check, patient reports compliance with medications. She denies having any headaches, chest pain and shortness of breath.  Since her last visit, she had a robotic assisted laparoscopic sacrocolpopexy, cystoscopy and oophorectomy performed on 11/08/22.  She has had some issues post-op, but for the most part feels well.         Past Medical History:  Diagnosis Date   Allergy     Anxiety    Arthritis    lt knee   Asthma 2011   Complication of anesthesia    Fatty liver    GERD (gastroesophageal reflux disease)    Headache    sinus   History of COVID-19 10/11/2022   PONV (postoperative nausea and vomiting)    Pre-diabetes    Sleep apnea    cpap set on 4   Urticaria    Vitamin D  deficiency    Wears glasses      Family History  Problem Relation Age of Onset   Kidney disease Mother    Hypertension Mother    Allergic rhinitis Mother    Heart disease Father    Allergic rhinitis Father    Asthma Father    Urticaria Sister    Breast cancer Paternal Grandmother    Cancer Paternal Grandmother    COPD Paternal Grandmother    Breast cancer Maternal Aunt    Cancer Maternal Aunt    COPD Maternal Aunt    Hypertension Maternal Aunt    Breast cancer Paternal Aunt    Anxiety disorder  Daughter    Depression Daughter    Asthma Son    Asthma Sister    Cancer Sister    Hypertension Sister    Cancer Maternal Aunt    Cancer Maternal Aunt    Cancer Maternal Uncle    Hypertension Maternal Uncle    Cancer Maternal Uncle    Cancer Maternal Uncle    Hypertension Maternal Uncle    Cancer Maternal Uncle    COPD Maternal Uncle    Depression Maternal Uncle    COPD Paternal Uncle    Hypertension Sister    Hypertension Brother    Angioedema Neg Hx    Atopy Neg Hx    Eczema Neg Hx    Immunodeficiency Neg Hx     Current Medications[1]   Allergies[2]   Review of Systems   Constitutional: Negative.   Respiratory: Negative.    Cardiovascular: Negative.   Neurological: Negative.   Psychiatric/Behavioral: Negative.       Today's Vitals   03/20/24 1523  BP: 118/78  Pulse: 87  Temp: 98.1 F (36.7 C)  SpO2: 98%  Weight: 202 lb 12.8 oz (92 kg)  Height: 4' 11 (1.499 m)   Body mass index is 40.96 kg/m.  Wt Readings from Last 3 Encounters:  03/20/24 202 lb 12.8 oz (92 kg)  02/29/24 202 lb (91.6 kg)  11/17/23 205 lb (93 kg)    The ASCVD Risk score (Arnett DK, et al., 2019) failed to calculate for the following reasons:   The valid total cholesterol range is 130 to 320 mg/dL  Objective:  Physical Exam      Assessment And Plan:   Assessment & Plan Prediabetes  Class 3 severe obesity due to excess calories with serious comorbidity and body mass index (BMI) of 40.0 to 44.9 in adult [facility])  Pure hypercholesterolemia   Assessment & Plan Prediabetes Recent dietary indiscretion noted. - Performed prediabetes test today.  Class 3 severe obesity due to excess calories with serious comorbidity BMI 40.0-44.9. Interested in pharmacological weight loss. Genetic counseling needed due to family history of cancer. Previous dietary changes unsuccessful. - Referred to genetic counseling for cancer risk assessment. - Referred to [facility]. - Discussed weight loss medication options, considering family history of cancer.   No orders of the defined types were placed in this encounter.    Return if symptoms worsen or fail to improve.  Patient was given opportunity to ask questions. Patient verbalized understanding of the plan and was able to repeat key elements of the plan. All questions were answered to their satisfaction.    I, Catheryn LOISE Slocumb, MD, have reviewed all documentation for this visit. The documentation on 03/20/24 for the exam, diagnosis, procedures, and orders are all accurate and complete.   IF YOU HAVE BEEN REFERRED TO A  SPECIALIST, IT MAY TAKE 1-2 WEEKS TO SCHEDULE/PROCESS THE REFERRAL. IF YOU HAVE NOT HEARD FROM US /SPECIALIST IN TWO WEEKS, PLEASE GIVE US  A CALL AT [phone].      [1]  Current Outpatient Medications:    acetaminophen  (TYLENOL ) 500 MG tablet, Take 1 tablet (500 mg total) by mouth every 6 (six) hours as needed (pain)., Disp: 30 tablet, Rfl: 0   albuterol  (PROVENTIL ) (2.5 MG/3ML) 0.083% nebulizer solution, Use 1 vial every 4-6 hours as needed for cough, wheeze, shortness of breath or chest tightness, Disp: 150 mL, Rfl: 0   albuterol  (VENTOLIN  HFA) 108 (90 Base) MCG/ACT inhaler, Inhale 2 puffs into the lungs every 4 (  four) hours as needed for wheezing or shortness of breath., Disp: 18 g, Rfl: 1   Ascorbic Acid (VITAMIN C) 100 MG tablet, Take 100 mg by mouth daily., Disp: , Rfl:    atorvastatin  (LIPITOR) 20 MG tablet, TAKE 1 TABLET BY MOUTH EVERY DAY, Disp: 90 tablet, Rfl: 1   BREO ELLIPTA  200-25 MCG/ACT AEPB, Inhale 1 puff into the lungs daily., Disp: 60 each, Rfl: 5   cetirizine  (ZYRTEC ) 10 MG tablet, Take 1 tablet (10 mg total) by mouth daily., Disp: 30 tablet, Rfl: 5   Cholecalciferol (VITAMIN D ) 50 MCG (2000 UT) CAPS, Take 5,000 Units by mouth daily. , Disp: , Rfl:    clobetasol  cream (TEMOVATE ) 0.05 %, Apply topically 2 (TWO) times a day as needed to areas of ras, Disp: 60 g, Rfl: 1   DULoxetine  (CYMBALTA ) 60 MG capsule, TAKE 1 CAPSULE BY MOUTH EVERY DAY, Disp: 90 capsule, Rfl: 2   EPINEPHrine  0.3 mg/0.3 mL IJ SOAJ injection, Inject 0.3 mLs (0.3 mg total) into the muscle as needed for anaphylaxis., Disp: 1 each, Rfl: 1   estradiol  (ESTRACE ) 0.01 % CREA vaginal cream, Place 0.5 g vaginally 2 (two) times a week. Place 0.5g nightly for two weeks then twice a week after, Disp: 42.5 g, Rfl: 11   fexofenadine  (ALLEGRA ) 180 MG tablet, Take 1 tablet by mouth daily as needed for for allergy  symptoms. Take an extra tablet as needed for hive flare., Disp: 90 tablet, Rfl: 1   gabapentin  (NEURONTIN )  300 MG capsule, as needed., Disp: , Rfl:    hydrOXYzine  (ATARAX ) 25 MG tablet, TAKE 1 TABLET BY MOUTH AT BEDTIME AS NEEDED FOR ITCHING., Disp: 30 tablet, Rfl: 0   ipratropium (ATROVENT ) 0.06 % nasal spray, 1-2 sprays each nostril up to 3-4 times a day as needed for nasal drainage or congestion, Disp: 15 mL, Rfl: 5   ipratropium-albuterol  (DUONEB) 0.5-2.5 (3) MG/3ML SOLN, Inhale 3 mLs into the lungs daily as needed., Disp: , Rfl:    Krill Oil (OMEGA-3) 500 MG CAPS, Take 500 mg by mouth daily., Disp: , Rfl:    lidocaine  (LIDODERM ) 5 %, Place 1 patch onto the skin daily., Disp: , Rfl:    magnesium  oxide (MAG-OX) 400 MG tablet, Take 400 mg by mouth daily., Disp: , Rfl:    meclizine  (ANTIVERT ) 25 MG tablet, Take 1 tablet (25 mg total) by mouth 3 (three) times daily as needed for dizziness., Disp: 30 tablet, Rfl: 0   Menthol-Methyl Salicylate (TIGER BALM LINIMENT EX), Apply 1 application topically at bedtime as needed (pain)., Disp: , Rfl:    montelukast  (SINGULAIR ) 10 MG tablet, Take 1 tablet (10 mg total) by mouth at bedtime., Disp: 30 tablet, Rfl: 5   pantoprazole  (PROTONIX ) 40 MG tablet, TAKE 1 TABLET BY MOUTH EVERY DAY, Disp: 90 tablet, Rfl: 1   tiZANidine  (ZANAFLEX ) 4 MG tablet, Take 4 mg by mouth every 8 (eight) hours as needed., Disp: , Rfl:    traMADol  (ULTRAM ) 50 MG tablet, Take 1 tablet (50 mg total) by mouth every 6 (six) hours as needed., Disp: 5 tablet, Rfl: 0   triamcinolone  cream (KENALOG ) 0.1 %, APPLY TO AFFECTED AREA TWICE DAILY AS NEEDED, Disp: 453.6 g, Rfl: 1   TURMERIC PO, Take by mouth as needed., Disp: , Rfl:    vitamin E 400 UNIT capsule, Take 400 Units by mouth daily., Disp: , Rfl:    vitamin k 100 MCG tablet, Take 100 mcg by mouth daily., Disp: , Rfl:  zonisamide (ZONEGRAN) 50 MG capsule, Take 100 mg by mouth at bedtime., Disp: , Rfl:    predniSONE  (DELTASONE ) 5 MG tablet, Take 5 mg by mouth daily with breakfast. (Patient not taking: Reported on 03/20/2024), Disp: , Rfl:  [2]   Allergies Allergen Reactions   Meloxicam Other (See Comments)    Bruised her legs all over     Nsaids Other (See Comments)    Scarring and bruising of legs   Sulfa Antibiotics Other (See Comments)    Mouth broke out in sores   Cat Dander    Other Other (See Comments)   Oxycodone  Dermatitis   Pollen Extract    Erythromycin Nausea And Vomiting

## 2024-03-20 NOTE — Patient Instructions (Signed)
 Prediabetes: What to Know Prediabetes is when your blood sugar, also called glucose, is at a higher level than normal but not high enough for you to be diagnosed with type 2 diabetes (type 2 diabetes mellitus). Having prediabetes puts you at risk for getting type 2 diabetes. By making some healthy changes, you may be able to prevent or delay getting type 2 diabetes. This is important because type 2 diabetes can lead to serious problems. Some of these include: Heart disease. Stroke. Blindness. Kidney disease. Depression. Poor blood flow in the feet and legs. In very bad cases, this could lead to having a leg removed by surgery (amputation). What are the causes? The exact cause of prediabetes isn't known. It may result from insulin resistance. Insulin resistance happens when cells in the body don't respond properly to insulin that the body makes. This can cause too much sugar to build up in the blood. High blood sugar, also called hyperglycemia, can develop. What increases the risk? Having a family member with type 2 diabetes. Being older than 68 years of age. Having had a temporary form of diabetes during a pregnancy. This is called gestational diabetes. Having had polycystic ovary syndrome (PCOS). Being overweight or obese. Being inactive and not getting much exercise. Having a history of heart disease. This may include problems with cholesterol levels, high levels of blood fats, or high blood pressure. What are the signs or symptoms? You may have no symptoms. If you do have symptoms, they may include: Increased hunger. Increased thirst. Needing to pee more often. Changes in how you see, like blurry vision. Feeling tired. How is this diagnosed? Prediabetes can be diagnosed with blood tests that check your blood sugar. One or more of these tests may be done: A fasting blood glucose (FBG) test. You won't be allowed to eat (you will fast) for at least 8 hours before a blood sample is  taken. An A1C blood test, also called a hemoglobin A1C test. This test shows information about blood sugar levels over the past 2?3 months. An oral glucose tolerance test (OGTT). This test measures your blood sugar at two points in time: After you haven't eaten for a while. This is your baseline level. Two hours after you drink a beverage that has sugar in it. You may be diagnosed with prediabetes if: Your FBG is 100?125 mg/dL (1.6-1.0 mmol/L). Your A1C level is 5.7?6.4% (39-46 mmol/mol). Your OGTT result is 140?199 mg/dL (9.6-04 mmol/L). These blood tests may need to be done again to be sure of the diagnosis. How is this treated? Treatment may include making changes to your diet and lifestyle. These changes can help lower your blood sugar and keep you from getting type 2 diabetes. In some cases, medicine may be given to help lower your risk. Follow these instructions at home: Eating and drinking  Eat and drink as told. Follow a healthy meal plan. This includes eating lean proteins, whole grains, legumes, fresh fruits and vegetables, low-fat dairy products, and healthy fats. Meet with an expert in healthy eating called a dietitian. This person can help create a healthy eating plan that's right for you. Lifestyle Do moderate-intensity exercise. Do this for at least 30 minutes a day on 5 or more days each week, or as told by your health care provider. A mix of activities may be best. Good choices include brisk walking, swimming, biking, and weight lifting. Try to lose weight if your provider says it's OK. Losing 5-7% of your body weight can  help reverse insulin resistance. Do not drink alcohol if: Your provider tells you not to drink. You're pregnant, may be pregnant, or plan to become pregnant. If you drink alcohol: Limit how much you have to: 0-1 drink a day if you're female. 0-2 drinks a day if you're female. Know how much alcohol is in your drink. In the [place] one drink is one 12 oz  bottle of beer (355 mL), one 5 oz glass of wine (148 mL), or one 1 oz glass of hard liquor (44 mL). General instructions Take medicines only as told. You may be given medicines that help lower the risk of type 2 diabetes. Do not smoke, vape, or use nicotine or tobacco. Where to find more information American Diabetes Association: diabetes.org/about-diabetes/prediabetes Academy of Nutrition and Dietetics: eatright.org American Heart Association: Go to ThisJobs.cz. Click the search icon. Type "prediabetes" in the search box. Contact a health care provider if: You have any of these symptoms: Increased hunger. Peeing more often than usual. Increased thirst. Feeling tired. Changes in how you see, like blurry vision. Feeling like you may throw up. Throwing up. Get help right away if: You have shortness of breath. You feel confused. This information is not intended to replace advice given to you by your health care provider. Make sure you discuss any questions you have with your health care provider. Document Revised: 09/04/2022 Document Reviewed: 09/04/2022 Elsevier Patient Education  2024 [employer].

## 2024-03-21 LAB — CMP14+EGFR
ALT: 12 [IU]/L (ref 0–32)
AST: 17 [IU]/L (ref 0–40)
Albumin: 4.2 g/dL (ref 3.9–4.9)
Alkaline Phosphatase: 152 [IU]/L — ABNORMAL HIGH (ref 49–135)
BUN/Creatinine Ratio: 17 (ref 12–28)
BUN: 13 mg/dL (ref 8–27)
Bilirubin Total: 0.6 mg/dL (ref 0.0–1.2)
CO2: 20 mmol/L (ref 20–29)
Calcium: 9.7 mg/dL (ref 8.7–10.3)
Chloride: 106 mmol/L (ref 96–106)
Creatinine, Ser: 0.78 mg/dL (ref 0.57–1.00)
Globulin, Total: 3.1 g/dL (ref 1.5–4.5)
Glucose: 84 mg/dL (ref 70–99)
Potassium: 4.1 mmol/L (ref 3.5–5.2)
Sodium: 143 mmol/L (ref 134–144)
Total Protein: 7.3 g/dL (ref 6.0–8.5)
eGFR: 83 mL/min/{1.73_m2}

## 2024-03-21 LAB — MICROSCOPIC EXAMINATION
Bacteria, UA: NONE SEEN
Casts: NONE SEEN /LPF
RBC, Urine: NONE SEEN /HPF (ref 0–2)

## 2024-03-21 LAB — URINALYSIS, COMPLETE
Bilirubin, UA: NEGATIVE
Glucose, UA: NEGATIVE
Ketones, UA: NEGATIVE
Nitrite, UA: NEGATIVE
RBC, UA: NEGATIVE
Specific Gravity, UA: 1.026 (ref 1.005–1.030)
Urobilinogen, Ur: 1 mg/dL (ref 0.2–1.0)
pH, UA: 6 (ref 5.0–7.5)

## 2024-03-21 LAB — HEMOGLOBIN A1C
Est. average glucose Bld gHb Est-mCnc: 128 mg/dL
Hgb A1c MFr Bld: 6.1 % — ABNORMAL HIGH (ref 4.8–5.6)

## 2024-03-22 NOTE — Assessment & Plan Note (Signed)
 Previous labs reviewed, her A1c has been elevated in the past. I will check an A1c today. Reminded to avoid refined sugars including sugary drinks/foods and processed meats including bacon, sausages and deli meats.

## 2024-03-22 NOTE — Assessment & Plan Note (Signed)
 BMI 40.  Interested in pharmacological weight loss. Genetic counseling needed due to extensive family history of cancer. There is no known family history of thyroid cancer. Previous dietary changes unsuccessful. - Referred to genetic counseling for cancer risk assessment. - Referred to [facility]. - Discussed weight loss medication options, considering family history of cancer.

## 2024-03-29 ENCOUNTER — Encounter: Admitting: Obstetrics and Gynecology

## 2024-05-16 ENCOUNTER — Encounter: Payer: Self-pay | Admitting: Internal Medicine

## 2024-05-23 ENCOUNTER — Ambulatory Visit: Admitting: Allergy

## 2024-07-10 ENCOUNTER — Ambulatory Visit: Payer: Self-pay
